# Patient Record
Sex: Female | Born: 1940 | Race: White | Hispanic: No | Marital: Married | Smoking: Never smoker
Health system: Southern US, Community
[De-identification: ages and names within clinical notes are randomized; demographics above are authoritative.]

## PROBLEM LIST (undated history)

## (undated) DIAGNOSIS — C50919 Malignant neoplasm of unspecified site of unspecified female breast: Secondary | ICD-10-CM

## (undated) DIAGNOSIS — E119 Type 2 diabetes mellitus without complications: Secondary | ICD-10-CM

## (undated) DIAGNOSIS — G4752 REM sleep behavior disorder: Secondary | ICD-10-CM

## (undated) DIAGNOSIS — G2581 Restless legs syndrome: Secondary | ICD-10-CM

## (undated) DIAGNOSIS — G20 Parkinson's disease: Secondary | ICD-10-CM

## (undated) DIAGNOSIS — E785 Hyperlipidemia, unspecified: Secondary | ICD-10-CM

## (undated) DIAGNOSIS — C7960 Secondary malignant neoplasm of unspecified ovary: Secondary | ICD-10-CM

## (undated) DIAGNOSIS — M1990 Unspecified osteoarthritis, unspecified site: Secondary | ICD-10-CM

## (undated) DIAGNOSIS — C189 Malignant neoplasm of colon, unspecified: Secondary | ICD-10-CM

## (undated) DIAGNOSIS — G20A1 Parkinson's disease without dyskinesia, without mention of fluctuations: Secondary | ICD-10-CM

## (undated) DIAGNOSIS — M419 Scoliosis, unspecified: Secondary | ICD-10-CM

## (undated) HISTORY — DX: Hyperlipidemia, unspecified: E78.5

## (undated) HISTORY — DX: Parkinson's disease without dyskinesia, without mention of fluctuations: G20.A1

## (undated) HISTORY — PX: BUNIONECTOMY: SHX129

## (undated) HISTORY — PX: BREAST LUMPECTOMY: SHX2

## (undated) HISTORY — PX: TONSILLECTOMY: SUR1361

## (undated) HISTORY — DX: Parkinson's disease: G20

## (undated) HISTORY — PX: OOPHORECTOMY: SHX86

## (undated) HISTORY — DX: Unspecified osteoarthritis, unspecified site: M19.90

## (undated) HISTORY — PX: COLON SURGERY: SHX602

## (undated) HISTORY — DX: Malignant neoplasm of unspecified site of unspecified female breast: C50.919

## (undated) HISTORY — DX: Scoliosis, unspecified: M41.9

## (undated) HISTORY — PX: NASAL SINUS SURGERY: SHX719

## (undated) HISTORY — DX: Type 2 diabetes mellitus without complications: E11.9

## (undated) HISTORY — DX: Secondary malignant neoplasm of unspecified ovary: C79.60

## (undated) HISTORY — PX: LAPAROSCOPY: SHX197

## (undated) HISTORY — PX: HEMORRHOID SURGERY: SHX153

## (undated) HISTORY — DX: REM sleep behavior disorder: G47.52

## (undated) HISTORY — DX: Malignant neoplasm of colon, unspecified: C18.9

## (undated) HISTORY — PX: TUBAL LIGATION: SHX77

## (undated) HISTORY — DX: Restless legs syndrome: G25.81

## (undated) HISTORY — PX: ADENOIDECTOMY: SUR15

---

## 2015-02-13 DIAGNOSIS — E063 Autoimmune thyroiditis: Secondary | ICD-10-CM | POA: Insufficient documentation

## 2015-02-13 DIAGNOSIS — C50919 Malignant neoplasm of unspecified site of unspecified female breast: Secondary | ICD-10-CM | POA: Insufficient documentation

## 2015-02-13 DIAGNOSIS — E559 Vitamin D deficiency, unspecified: Secondary | ICD-10-CM | POA: Insufficient documentation

## 2015-02-13 DIAGNOSIS — N390 Urinary tract infection, site not specified: Secondary | ICD-10-CM | POA: Insufficient documentation

## 2015-02-13 DIAGNOSIS — Z853 Personal history of malignant neoplasm of breast: Secondary | ICD-10-CM | POA: Insufficient documentation

## 2015-02-13 DIAGNOSIS — N952 Postmenopausal atrophic vaginitis: Secondary | ICD-10-CM | POA: Insufficient documentation

## 2015-02-13 DIAGNOSIS — K582 Mixed irritable bowel syndrome: Secondary | ICD-10-CM | POA: Insufficient documentation

## 2015-02-13 DIAGNOSIS — Z85038 Personal history of other malignant neoplasm of large intestine: Secondary | ICD-10-CM | POA: Insufficient documentation

## 2015-02-13 DIAGNOSIS — E038 Other specified hypothyroidism: Secondary | ICD-10-CM | POA: Insufficient documentation

## 2015-03-07 DIAGNOSIS — G620 Drug-induced polyneuropathy: Secondary | ICD-10-CM | POA: Insufficient documentation

## 2015-03-07 DIAGNOSIS — L720 Epidermal cyst: Secondary | ICD-10-CM | POA: Insufficient documentation

## 2015-03-07 DIAGNOSIS — H1013 Acute atopic conjunctivitis, bilateral: Secondary | ICD-10-CM | POA: Insufficient documentation

## 2015-03-07 DIAGNOSIS — R7301 Impaired fasting glucose: Secondary | ICD-10-CM | POA: Insufficient documentation

## 2015-03-07 DIAGNOSIS — E785 Hyperlipidemia, unspecified: Secondary | ICD-10-CM | POA: Insufficient documentation

## 2015-03-07 DIAGNOSIS — E782 Mixed hyperlipidemia: Secondary | ICD-10-CM | POA: Insufficient documentation

## 2016-03-20 DIAGNOSIS — Z7689 Persons encountering health services in other specified circumstances: Secondary | ICD-10-CM | POA: Diagnosis not present

## 2016-03-20 DIAGNOSIS — H65192 Other acute nonsuppurative otitis media, left ear: Secondary | ICD-10-CM | POA: Diagnosis not present

## 2016-03-30 LAB — HM COLONOSCOPY

## 2016-04-02 DIAGNOSIS — J3089 Other allergic rhinitis: Secondary | ICD-10-CM | POA: Diagnosis not present

## 2016-04-02 DIAGNOSIS — J3081 Allergic rhinitis due to animal (cat) (dog) hair and dander: Secondary | ICD-10-CM | POA: Diagnosis not present

## 2016-04-08 DIAGNOSIS — G620 Drug-induced polyneuropathy: Secondary | ICD-10-CM | POA: Diagnosis not present

## 2016-04-08 DIAGNOSIS — Z170 Estrogen receptor positive status [ER+]: Secondary | ICD-10-CM | POA: Diagnosis not present

## 2016-04-08 DIAGNOSIS — Z79811 Long term (current) use of aromatase inhibitors: Secondary | ICD-10-CM | POA: Diagnosis not present

## 2016-04-08 DIAGNOSIS — M2550 Pain in unspecified joint: Secondary | ICD-10-CM | POA: Diagnosis not present

## 2016-04-22 DIAGNOSIS — M7502 Adhesive capsulitis of left shoulder: Secondary | ICD-10-CM | POA: Diagnosis not present

## 2016-04-22 DIAGNOSIS — M25511 Pain in right shoulder: Secondary | ICD-10-CM | POA: Diagnosis not present

## 2016-04-22 DIAGNOSIS — M25512 Pain in left shoulder: Secondary | ICD-10-CM | POA: Diagnosis not present

## 2016-04-23 DIAGNOSIS — M7502 Adhesive capsulitis of left shoulder: Secondary | ICD-10-CM | POA: Diagnosis not present

## 2016-04-23 DIAGNOSIS — M6281 Muscle weakness (generalized): Secondary | ICD-10-CM | POA: Diagnosis not present

## 2016-04-23 DIAGNOSIS — M25512 Pain in left shoulder: Secondary | ICD-10-CM | POA: Diagnosis not present

## 2016-04-23 DIAGNOSIS — M25511 Pain in right shoulder: Secondary | ICD-10-CM | POA: Diagnosis not present

## 2016-04-27 DIAGNOSIS — J3089 Other allergic rhinitis: Secondary | ICD-10-CM | POA: Diagnosis not present

## 2016-04-27 DIAGNOSIS — J3081 Allergic rhinitis due to animal (cat) (dog) hair and dander: Secondary | ICD-10-CM | POA: Diagnosis not present

## 2016-04-28 DIAGNOSIS — M7502 Adhesive capsulitis of left shoulder: Secondary | ICD-10-CM | POA: Diagnosis not present

## 2016-04-28 DIAGNOSIS — M25511 Pain in right shoulder: Secondary | ICD-10-CM | POA: Diagnosis not present

## 2016-04-28 DIAGNOSIS — M6281 Muscle weakness (generalized): Secondary | ICD-10-CM | POA: Diagnosis not present

## 2016-04-28 DIAGNOSIS — M25512 Pain in left shoulder: Secondary | ICD-10-CM | POA: Diagnosis not present

## 2016-04-30 DIAGNOSIS — M7502 Adhesive capsulitis of left shoulder: Secondary | ICD-10-CM | POA: Diagnosis not present

## 2016-04-30 DIAGNOSIS — M25511 Pain in right shoulder: Secondary | ICD-10-CM | POA: Diagnosis not present

## 2016-04-30 DIAGNOSIS — M6281 Muscle weakness (generalized): Secondary | ICD-10-CM | POA: Diagnosis not present

## 2016-04-30 DIAGNOSIS — M25512 Pain in left shoulder: Secondary | ICD-10-CM | POA: Diagnosis not present

## 2016-05-12 DIAGNOSIS — M25511 Pain in right shoulder: Secondary | ICD-10-CM | POA: Diagnosis not present

## 2016-05-12 DIAGNOSIS — M25512 Pain in left shoulder: Secondary | ICD-10-CM | POA: Diagnosis not present

## 2016-05-12 DIAGNOSIS — M7502 Adhesive capsulitis of left shoulder: Secondary | ICD-10-CM | POA: Diagnosis not present

## 2016-05-12 DIAGNOSIS — M6281 Muscle weakness (generalized): Secondary | ICD-10-CM | POA: Diagnosis not present

## 2016-05-15 DIAGNOSIS — M6281 Muscle weakness (generalized): Secondary | ICD-10-CM | POA: Diagnosis not present

## 2016-05-15 DIAGNOSIS — M25511 Pain in right shoulder: Secondary | ICD-10-CM | POA: Diagnosis not present

## 2016-05-15 DIAGNOSIS — M7502 Adhesive capsulitis of left shoulder: Secondary | ICD-10-CM | POA: Diagnosis not present

## 2016-05-15 DIAGNOSIS — M25512 Pain in left shoulder: Secondary | ICD-10-CM | POA: Diagnosis not present

## 2016-05-21 DIAGNOSIS — J3089 Other allergic rhinitis: Secondary | ICD-10-CM | POA: Diagnosis not present

## 2016-05-21 DIAGNOSIS — J3081 Allergic rhinitis due to animal (cat) (dog) hair and dander: Secondary | ICD-10-CM | POA: Diagnosis not present

## 2016-05-25 DIAGNOSIS — J3081 Allergic rhinitis due to animal (cat) (dog) hair and dander: Secondary | ICD-10-CM | POA: Diagnosis not present

## 2016-05-25 DIAGNOSIS — J3089 Other allergic rhinitis: Secondary | ICD-10-CM | POA: Diagnosis not present

## 2016-05-29 DIAGNOSIS — M25512 Pain in left shoulder: Secondary | ICD-10-CM | POA: Diagnosis not present

## 2016-05-29 DIAGNOSIS — M7502 Adhesive capsulitis of left shoulder: Secondary | ICD-10-CM | POA: Diagnosis not present

## 2016-05-29 DIAGNOSIS — M6281 Muscle weakness (generalized): Secondary | ICD-10-CM | POA: Diagnosis not present

## 2016-05-29 DIAGNOSIS — M25511 Pain in right shoulder: Secondary | ICD-10-CM | POA: Diagnosis not present

## 2016-06-03 DIAGNOSIS — N390 Urinary tract infection, site not specified: Secondary | ICD-10-CM | POA: Diagnosis not present

## 2016-06-03 DIAGNOSIS — R300 Dysuria: Secondary | ICD-10-CM | POA: Diagnosis not present

## 2016-06-05 DIAGNOSIS — M25511 Pain in right shoulder: Secondary | ICD-10-CM | POA: Diagnosis not present

## 2016-06-05 DIAGNOSIS — M6281 Muscle weakness (generalized): Secondary | ICD-10-CM | POA: Diagnosis not present

## 2016-06-05 DIAGNOSIS — M7502 Adhesive capsulitis of left shoulder: Secondary | ICD-10-CM | POA: Diagnosis not present

## 2016-06-05 DIAGNOSIS — M25512 Pain in left shoulder: Secondary | ICD-10-CM | POA: Diagnosis not present

## 2016-06-09 DIAGNOSIS — M7502 Adhesive capsulitis of left shoulder: Secondary | ICD-10-CM | POA: Diagnosis not present

## 2016-06-09 DIAGNOSIS — E039 Hypothyroidism, unspecified: Secondary | ICD-10-CM | POA: Diagnosis not present

## 2016-06-09 DIAGNOSIS — M1612 Unilateral primary osteoarthritis, left hip: Secondary | ICD-10-CM | POA: Diagnosis not present

## 2016-06-09 DIAGNOSIS — M25511 Pain in right shoulder: Secondary | ICD-10-CM | POA: Diagnosis not present

## 2016-06-09 DIAGNOSIS — M25552 Pain in left hip: Secondary | ICD-10-CM | POA: Diagnosis not present

## 2016-06-09 DIAGNOSIS — M25512 Pain in left shoulder: Secondary | ICD-10-CM | POA: Diagnosis not present

## 2016-06-09 DIAGNOSIS — R739 Hyperglycemia, unspecified: Secondary | ICD-10-CM | POA: Diagnosis not present

## 2016-06-11 DIAGNOSIS — M6281 Muscle weakness (generalized): Secondary | ICD-10-CM | POA: Diagnosis not present

## 2016-06-11 DIAGNOSIS — M25512 Pain in left shoulder: Secondary | ICD-10-CM | POA: Diagnosis not present

## 2016-06-11 DIAGNOSIS — M25511 Pain in right shoulder: Secondary | ICD-10-CM | POA: Diagnosis not present

## 2016-06-11 DIAGNOSIS — M7502 Adhesive capsulitis of left shoulder: Secondary | ICD-10-CM | POA: Diagnosis not present

## 2016-06-16 DIAGNOSIS — G609 Hereditary and idiopathic neuropathy, unspecified: Secondary | ICD-10-CM | POA: Diagnosis not present

## 2016-06-16 DIAGNOSIS — K219 Gastro-esophageal reflux disease without esophagitis: Secondary | ICD-10-CM | POA: Diagnosis not present

## 2016-06-16 DIAGNOSIS — E039 Hypothyroidism, unspecified: Secondary | ICD-10-CM | POA: Diagnosis not present

## 2016-06-16 DIAGNOSIS — C50412 Malignant neoplasm of upper-outer quadrant of left female breast: Secondary | ICD-10-CM | POA: Diagnosis not present

## 2016-06-16 DIAGNOSIS — R739 Hyperglycemia, unspecified: Secondary | ICD-10-CM | POA: Diagnosis not present

## 2016-06-22 DIAGNOSIS — J3089 Other allergic rhinitis: Secondary | ICD-10-CM | POA: Diagnosis not present

## 2016-06-22 DIAGNOSIS — J3081 Allergic rhinitis due to animal (cat) (dog) hair and dander: Secondary | ICD-10-CM | POA: Diagnosis not present

## 2016-07-01 DIAGNOSIS — Z79811 Long term (current) use of aromatase inhibitors: Secondary | ICD-10-CM | POA: Diagnosis not present

## 2016-07-01 DIAGNOSIS — Z170 Estrogen receptor positive status [ER+]: Secondary | ICD-10-CM | POA: Diagnosis not present

## 2016-07-01 DIAGNOSIS — G620 Drug-induced polyneuropathy: Secondary | ICD-10-CM | POA: Diagnosis not present

## 2016-07-01 DIAGNOSIS — C180 Malignant neoplasm of cecum: Secondary | ICD-10-CM | POA: Diagnosis not present

## 2016-07-10 DIAGNOSIS — Q667 Congenital pes cavus: Secondary | ICD-10-CM | POA: Diagnosis not present

## 2016-07-10 DIAGNOSIS — M7742 Metatarsalgia, left foot: Secondary | ICD-10-CM | POA: Diagnosis not present

## 2016-07-10 DIAGNOSIS — M7741 Metatarsalgia, right foot: Secondary | ICD-10-CM | POA: Diagnosis not present

## 2016-07-10 DIAGNOSIS — L84 Corns and callosities: Secondary | ICD-10-CM | POA: Diagnosis not present

## 2016-07-20 DIAGNOSIS — R922 Inconclusive mammogram: Secondary | ICD-10-CM | POA: Diagnosis not present

## 2016-07-20 DIAGNOSIS — M810 Age-related osteoporosis without current pathological fracture: Secondary | ICD-10-CM | POA: Diagnosis not present

## 2016-07-20 DIAGNOSIS — M8589 Other specified disorders of bone density and structure, multiple sites: Secondary | ICD-10-CM | POA: Diagnosis not present

## 2016-07-20 DIAGNOSIS — Z13820 Encounter for screening for osteoporosis: Secondary | ICD-10-CM | POA: Diagnosis not present

## 2016-07-20 DIAGNOSIS — Z853 Personal history of malignant neoplasm of breast: Secondary | ICD-10-CM | POA: Diagnosis not present

## 2016-07-20 DIAGNOSIS — Z780 Asymptomatic menopausal state: Secondary | ICD-10-CM | POA: Diagnosis not present

## 2016-07-20 DIAGNOSIS — M799 Soft tissue disorder, unspecified: Secondary | ICD-10-CM | POA: Diagnosis not present

## 2016-07-21 DIAGNOSIS — J3089 Other allergic rhinitis: Secondary | ICD-10-CM | POA: Diagnosis not present

## 2016-07-21 DIAGNOSIS — J3081 Allergic rhinitis due to animal (cat) (dog) hair and dander: Secondary | ICD-10-CM | POA: Diagnosis not present

## 2016-08-20 DIAGNOSIS — J3081 Allergic rhinitis due to animal (cat) (dog) hair and dander: Secondary | ICD-10-CM | POA: Diagnosis not present

## 2016-08-20 DIAGNOSIS — J3089 Other allergic rhinitis: Secondary | ICD-10-CM | POA: Diagnosis not present

## 2016-08-27 DIAGNOSIS — M25561 Pain in right knee: Secondary | ICD-10-CM | POA: Diagnosis not present

## 2016-09-08 DIAGNOSIS — J3081 Allergic rhinitis due to animal (cat) (dog) hair and dander: Secondary | ICD-10-CM | POA: Diagnosis not present

## 2016-09-08 DIAGNOSIS — J3089 Other allergic rhinitis: Secondary | ICD-10-CM | POA: Diagnosis not present

## 2016-09-13 DIAGNOSIS — N398 Other specified disorders of urinary system: Secondary | ICD-10-CM | POA: Insufficient documentation

## 2016-09-13 DIAGNOSIS — J8410 Pulmonary fibrosis, unspecified: Secondary | ICD-10-CM | POA: Insufficient documentation

## 2016-09-13 DIAGNOSIS — N393 Stress incontinence (female) (male): Secondary | ICD-10-CM | POA: Insufficient documentation

## 2016-09-13 DIAGNOSIS — R319 Hematuria, unspecified: Secondary | ICD-10-CM | POA: Diagnosis not present

## 2016-09-13 DIAGNOSIS — N390 Urinary tract infection, site not specified: Secondary | ICD-10-CM | POA: Diagnosis not present

## 2016-09-13 DIAGNOSIS — R309 Painful micturition, unspecified: Secondary | ICD-10-CM | POA: Diagnosis not present

## 2016-09-13 DIAGNOSIS — K219 Gastro-esophageal reflux disease without esophagitis: Secondary | ICD-10-CM | POA: Insufficient documentation

## 2016-09-15 DIAGNOSIS — M47812 Spondylosis without myelopathy or radiculopathy, cervical region: Secondary | ICD-10-CM | POA: Diagnosis not present

## 2016-09-15 DIAGNOSIS — M419 Scoliosis, unspecified: Secondary | ICD-10-CM | POA: Diagnosis not present

## 2016-09-15 DIAGNOSIS — M542 Cervicalgia: Secondary | ICD-10-CM | POA: Diagnosis not present

## 2016-09-23 DIAGNOSIS — R251 Tremor, unspecified: Secondary | ICD-10-CM | POA: Diagnosis not present

## 2016-09-23 LAB — CBC AND DIFFERENTIAL
HCT: 42 (ref 36–46)
Hemoglobin: 14.4 (ref 12.0–16.0)
Platelets: 246 (ref 150–399)
WBC: 5.2

## 2016-09-23 LAB — BASIC METABOLIC PANEL
BUN: 14 (ref 4–21)
Creatinine: 1.1 (ref <=1.1)
Glucose: 103
Potassium: 4.1 (ref 3.4–5.3)
Sodium: 134 — AB (ref 137–147)

## 2016-09-23 LAB — VITAMIN D 25 HYDROXY (VIT D DEFICIENCY, FRACTURES): Vit D, 25-Hydroxy: 57.32

## 2016-09-23 LAB — HEPATIC FUNCTION PANEL
ALT: 13 (ref 7–35)
AST: 19 (ref 13–35)

## 2016-09-25 DIAGNOSIS — M47812 Spondylosis without myelopathy or radiculopathy, cervical region: Secondary | ICD-10-CM | POA: Diagnosis not present

## 2016-09-25 DIAGNOSIS — M419 Scoliosis, unspecified: Secondary | ICD-10-CM | POA: Diagnosis not present

## 2016-09-25 DIAGNOSIS — M542 Cervicalgia: Secondary | ICD-10-CM | POA: Diagnosis not present

## 2016-10-02 DIAGNOSIS — J3089 Other allergic rhinitis: Secondary | ICD-10-CM | POA: Diagnosis not present

## 2016-10-02 DIAGNOSIS — J3081 Allergic rhinitis due to animal (cat) (dog) hair and dander: Secondary | ICD-10-CM | POA: Diagnosis not present

## 2016-10-13 DIAGNOSIS — M419 Scoliosis, unspecified: Secondary | ICD-10-CM | POA: Diagnosis not present

## 2016-10-13 DIAGNOSIS — M47812 Spondylosis without myelopathy or radiculopathy, cervical region: Secondary | ICD-10-CM | POA: Diagnosis not present

## 2016-10-13 DIAGNOSIS — M542 Cervicalgia: Secondary | ICD-10-CM | POA: Diagnosis not present

## 2016-10-16 DIAGNOSIS — M419 Scoliosis, unspecified: Secondary | ICD-10-CM | POA: Diagnosis not present

## 2016-10-16 DIAGNOSIS — M47812 Spondylosis without myelopathy or radiculopathy, cervical region: Secondary | ICD-10-CM | POA: Diagnosis not present

## 2016-10-16 DIAGNOSIS — M542 Cervicalgia: Secondary | ICD-10-CM | POA: Diagnosis not present

## 2016-10-19 DIAGNOSIS — M542 Cervicalgia: Secondary | ICD-10-CM | POA: Diagnosis not present

## 2016-10-19 DIAGNOSIS — M419 Scoliosis, unspecified: Secondary | ICD-10-CM | POA: Diagnosis not present

## 2016-10-19 DIAGNOSIS — M47812 Spondylosis without myelopathy or radiculopathy, cervical region: Secondary | ICD-10-CM | POA: Diagnosis not present

## 2016-10-21 DIAGNOSIS — R300 Dysuria: Secondary | ICD-10-CM | POA: Diagnosis not present

## 2016-10-21 DIAGNOSIS — G252 Other specified forms of tremor: Secondary | ICD-10-CM | POA: Diagnosis not present

## 2016-10-23 DIAGNOSIS — J3089 Other allergic rhinitis: Secondary | ICD-10-CM | POA: Diagnosis not present

## 2016-10-23 DIAGNOSIS — J3081 Allergic rhinitis due to animal (cat) (dog) hair and dander: Secondary | ICD-10-CM | POA: Diagnosis not present

## 2016-11-03 DIAGNOSIS — J3089 Other allergic rhinitis: Secondary | ICD-10-CM | POA: Diagnosis not present

## 2016-11-03 DIAGNOSIS — M50223 Other cervical disc displacement at C6-C7 level: Secondary | ICD-10-CM | POA: Diagnosis not present

## 2016-11-03 DIAGNOSIS — J3081 Allergic rhinitis due to animal (cat) (dog) hair and dander: Secondary | ICD-10-CM | POA: Diagnosis not present

## 2016-11-03 DIAGNOSIS — M4802 Spinal stenosis, cervical region: Secondary | ICD-10-CM | POA: Diagnosis not present

## 2016-11-03 DIAGNOSIS — M50322 Other cervical disc degeneration at C5-C6 level: Secondary | ICD-10-CM | POA: Diagnosis not present

## 2016-11-03 DIAGNOSIS — M50321 Other cervical disc degeneration at C4-C5 level: Secondary | ICD-10-CM | POA: Diagnosis not present

## 2016-11-03 DIAGNOSIS — M542 Cervicalgia: Secondary | ICD-10-CM | POA: Diagnosis not present

## 2016-11-06 DIAGNOSIS — R251 Tremor, unspecified: Secondary | ICD-10-CM | POA: Diagnosis not present

## 2016-11-06 DIAGNOSIS — M48062 Spinal stenosis, lumbar region with neurogenic claudication: Secondary | ICD-10-CM | POA: Diagnosis not present

## 2016-11-06 DIAGNOSIS — M47812 Spondylosis without myelopathy or radiculopathy, cervical region: Secondary | ICD-10-CM | POA: Diagnosis not present

## 2016-11-06 DIAGNOSIS — M542 Cervicalgia: Secondary | ICD-10-CM | POA: Diagnosis not present

## 2016-11-09 DIAGNOSIS — R251 Tremor, unspecified: Secondary | ICD-10-CM | POA: Diagnosis not present

## 2016-11-09 DIAGNOSIS — M542 Cervicalgia: Secondary | ICD-10-CM | POA: Diagnosis not present

## 2016-11-09 DIAGNOSIS — M47812 Spondylosis without myelopathy or radiculopathy, cervical region: Secondary | ICD-10-CM | POA: Diagnosis not present

## 2016-11-09 DIAGNOSIS — M48062 Spinal stenosis, lumbar region with neurogenic claudication: Secondary | ICD-10-CM | POA: Diagnosis not present

## 2016-11-11 DIAGNOSIS — J328 Other chronic sinusitis: Secondary | ICD-10-CM | POA: Diagnosis not present

## 2016-11-11 DIAGNOSIS — J3081 Allergic rhinitis due to animal (cat) (dog) hair and dander: Secondary | ICD-10-CM | POA: Diagnosis not present

## 2016-11-11 DIAGNOSIS — K219 Gastro-esophageal reflux disease without esophagitis: Secondary | ICD-10-CM | POA: Diagnosis not present

## 2016-11-13 DIAGNOSIS — R251 Tremor, unspecified: Secondary | ICD-10-CM | POA: Diagnosis not present

## 2016-11-13 DIAGNOSIS — M47812 Spondylosis without myelopathy or radiculopathy, cervical region: Secondary | ICD-10-CM | POA: Diagnosis not present

## 2016-11-13 DIAGNOSIS — M48062 Spinal stenosis, lumbar region with neurogenic claudication: Secondary | ICD-10-CM | POA: Diagnosis not present

## 2016-11-13 DIAGNOSIS — M542 Cervicalgia: Secondary | ICD-10-CM | POA: Diagnosis not present

## 2016-11-16 DIAGNOSIS — J3089 Other allergic rhinitis: Secondary | ICD-10-CM | POA: Diagnosis not present

## 2016-11-16 DIAGNOSIS — J3081 Allergic rhinitis due to animal (cat) (dog) hair and dander: Secondary | ICD-10-CM | POA: Diagnosis not present

## 2016-11-24 DIAGNOSIS — J3089 Other allergic rhinitis: Secondary | ICD-10-CM | POA: Diagnosis not present

## 2016-11-24 DIAGNOSIS — J3081 Allergic rhinitis due to animal (cat) (dog) hair and dander: Secondary | ICD-10-CM | POA: Diagnosis not present

## 2016-11-25 DIAGNOSIS — K5900 Constipation, unspecified: Secondary | ICD-10-CM | POA: Diagnosis not present

## 2016-11-25 DIAGNOSIS — R194 Change in bowel habit: Secondary | ICD-10-CM | POA: Diagnosis not present

## 2016-11-26 DIAGNOSIS — G609 Hereditary and idiopathic neuropathy, unspecified: Secondary | ICD-10-CM | POA: Diagnosis not present

## 2016-11-26 DIAGNOSIS — R251 Tremor, unspecified: Secondary | ICD-10-CM | POA: Diagnosis not present

## 2016-11-27 DIAGNOSIS — Z79899 Other long term (current) drug therapy: Secondary | ICD-10-CM | POA: Diagnosis not present

## 2016-11-27 DIAGNOSIS — Z853 Personal history of malignant neoplasm of breast: Secondary | ICD-10-CM | POA: Diagnosis not present

## 2016-11-27 DIAGNOSIS — S79911A Unspecified injury of right hip, initial encounter: Secondary | ICD-10-CM | POA: Diagnosis not present

## 2016-11-27 DIAGNOSIS — Z8543 Personal history of malignant neoplasm of ovary: Secondary | ICD-10-CM | POA: Diagnosis not present

## 2016-11-27 DIAGNOSIS — M25551 Pain in right hip: Secondary | ICD-10-CM | POA: Diagnosis not present

## 2016-11-27 DIAGNOSIS — S32009A Unspecified fracture of unspecified lumbar vertebra, initial encounter for closed fracture: Secondary | ICD-10-CM | POA: Diagnosis not present

## 2016-11-27 DIAGNOSIS — W010XXA Fall on same level from slipping, tripping and stumbling without subsequent striking against object, initial encounter: Secondary | ICD-10-CM | POA: Diagnosis not present

## 2016-11-27 DIAGNOSIS — M545 Low back pain: Secondary | ICD-10-CM | POA: Diagnosis not present

## 2016-11-27 DIAGNOSIS — M1990 Unspecified osteoarthritis, unspecified site: Secondary | ICD-10-CM | POA: Diagnosis not present

## 2016-11-27 DIAGNOSIS — S3992XA Unspecified injury of lower back, initial encounter: Secondary | ICD-10-CM | POA: Diagnosis not present

## 2016-11-27 DIAGNOSIS — S32048A Other fracture of fourth lumbar vertebra, initial encounter for closed fracture: Secondary | ICD-10-CM | POA: Diagnosis not present

## 2016-11-27 DIAGNOSIS — Z7982 Long term (current) use of aspirin: Secondary | ICD-10-CM | POA: Diagnosis not present

## 2016-12-04 DIAGNOSIS — J3089 Other allergic rhinitis: Secondary | ICD-10-CM | POA: Diagnosis not present

## 2016-12-04 DIAGNOSIS — J3081 Allergic rhinitis due to animal (cat) (dog) hair and dander: Secondary | ICD-10-CM | POA: Diagnosis not present

## 2016-12-05 DIAGNOSIS — N3000 Acute cystitis without hematuria: Secondary | ICD-10-CM | POA: Diagnosis not present

## 2016-12-05 DIAGNOSIS — R3915 Urgency of urination: Secondary | ICD-10-CM | POA: Diagnosis not present

## 2016-12-14 DIAGNOSIS — E039 Hypothyroidism, unspecified: Secondary | ICD-10-CM | POA: Diagnosis not present

## 2016-12-14 DIAGNOSIS — R739 Hyperglycemia, unspecified: Secondary | ICD-10-CM | POA: Diagnosis not present

## 2016-12-16 DIAGNOSIS — J3089 Other allergic rhinitis: Secondary | ICD-10-CM | POA: Diagnosis not present

## 2016-12-16 DIAGNOSIS — J3081 Allergic rhinitis due to animal (cat) (dog) hair and dander: Secondary | ICD-10-CM | POA: Diagnosis not present

## 2016-12-17 DIAGNOSIS — E039 Hypothyroidism, unspecified: Secondary | ICD-10-CM | POA: Diagnosis not present

## 2016-12-17 DIAGNOSIS — S32008A Other fracture of unspecified lumbar vertebra, initial encounter for closed fracture: Secondary | ICD-10-CM | POA: Diagnosis not present

## 2016-12-17 DIAGNOSIS — R739 Hyperglycemia, unspecified: Secondary | ICD-10-CM | POA: Diagnosis not present

## 2016-12-17 DIAGNOSIS — K219 Gastro-esophageal reflux disease without esophagitis: Secondary | ICD-10-CM | POA: Diagnosis not present

## 2016-12-17 DIAGNOSIS — G609 Hereditary and idiopathic neuropathy, unspecified: Secondary | ICD-10-CM | POA: Diagnosis not present

## 2016-12-23 DIAGNOSIS — N390 Urinary tract infection, site not specified: Secondary | ICD-10-CM | POA: Diagnosis not present

## 2016-12-25 DIAGNOSIS — J3089 Other allergic rhinitis: Secondary | ICD-10-CM | POA: Diagnosis not present

## 2016-12-25 DIAGNOSIS — J3081 Allergic rhinitis due to animal (cat) (dog) hair and dander: Secondary | ICD-10-CM | POA: Diagnosis not present

## 2016-12-29 DIAGNOSIS — N390 Urinary tract infection, site not specified: Secondary | ICD-10-CM | POA: Diagnosis not present

## 2016-12-29 DIAGNOSIS — M6281 Muscle weakness (generalized): Secondary | ICD-10-CM | POA: Diagnosis not present

## 2016-12-29 DIAGNOSIS — N952 Postmenopausal atrophic vaginitis: Secondary | ICD-10-CM | POA: Diagnosis not present

## 2016-12-29 DIAGNOSIS — K5900 Constipation, unspecified: Secondary | ICD-10-CM | POA: Diagnosis not present

## 2016-12-29 DIAGNOSIS — R195 Other fecal abnormalities: Secondary | ICD-10-CM | POA: Diagnosis not present

## 2016-12-30 DIAGNOSIS — R69 Illness, unspecified: Secondary | ICD-10-CM | POA: Diagnosis not present

## 2017-01-05 DIAGNOSIS — J3089 Other allergic rhinitis: Secondary | ICD-10-CM | POA: Diagnosis not present

## 2017-01-05 DIAGNOSIS — Z1239 Encounter for other screening for malignant neoplasm of breast: Secondary | ICD-10-CM | POA: Diagnosis not present

## 2017-01-05 DIAGNOSIS — K219 Gastro-esophageal reflux disease without esophagitis: Secondary | ICD-10-CM | POA: Diagnosis not present

## 2017-01-05 DIAGNOSIS — Z483 Aftercare following surgery for neoplasm: Secondary | ICD-10-CM | POA: Diagnosis not present

## 2017-01-05 DIAGNOSIS — Z853 Personal history of malignant neoplasm of breast: Secondary | ICD-10-CM | POA: Diagnosis not present

## 2017-01-05 DIAGNOSIS — J3081 Allergic rhinitis due to animal (cat) (dog) hair and dander: Secondary | ICD-10-CM | POA: Diagnosis not present

## 2017-01-06 DIAGNOSIS — C50412 Malignant neoplasm of upper-outer quadrant of left female breast: Secondary | ICD-10-CM | POA: Diagnosis not present

## 2017-01-06 DIAGNOSIS — Z170 Estrogen receptor positive status [ER+]: Secondary | ICD-10-CM | POA: Diagnosis not present

## 2017-01-06 DIAGNOSIS — Z79810 Long term (current) use of selective estrogen receptor modulators (SERMs): Secondary | ICD-10-CM | POA: Diagnosis not present

## 2017-01-06 DIAGNOSIS — C180 Malignant neoplasm of cecum: Secondary | ICD-10-CM | POA: Diagnosis not present

## 2017-01-14 DIAGNOSIS — J3089 Other allergic rhinitis: Secondary | ICD-10-CM | POA: Diagnosis not present

## 2017-01-14 DIAGNOSIS — J3081 Allergic rhinitis due to animal (cat) (dog) hair and dander: Secondary | ICD-10-CM | POA: Diagnosis not present

## 2017-01-19 DIAGNOSIS — Z85038 Personal history of other malignant neoplasm of large intestine: Secondary | ICD-10-CM | POA: Diagnosis not present

## 2017-01-19 DIAGNOSIS — Z1211 Encounter for screening for malignant neoplasm of colon: Secondary | ICD-10-CM | POA: Diagnosis not present

## 2017-01-19 DIAGNOSIS — Z800 Family history of malignant neoplasm of digestive organs: Secondary | ICD-10-CM | POA: Diagnosis not present

## 2017-01-19 DIAGNOSIS — Z8371 Family history of colonic polyps: Secondary | ICD-10-CM | POA: Diagnosis not present

## 2017-01-19 DIAGNOSIS — R69 Illness, unspecified: Secondary | ICD-10-CM | POA: Diagnosis not present

## 2017-01-20 DIAGNOSIS — R300 Dysuria: Secondary | ICD-10-CM | POA: Diagnosis not present

## 2017-01-22 DIAGNOSIS — J3089 Other allergic rhinitis: Secondary | ICD-10-CM | POA: Diagnosis not present

## 2017-01-22 DIAGNOSIS — J3081 Allergic rhinitis due to animal (cat) (dog) hair and dander: Secondary | ICD-10-CM | POA: Diagnosis not present

## 2017-02-01 DIAGNOSIS — J3081 Allergic rhinitis due to animal (cat) (dog) hair and dander: Secondary | ICD-10-CM | POA: Diagnosis not present

## 2017-02-01 DIAGNOSIS — J3089 Other allergic rhinitis: Secondary | ICD-10-CM | POA: Diagnosis not present

## 2017-02-05 DIAGNOSIS — Z803 Family history of malignant neoplasm of breast: Secondary | ICD-10-CM | POA: Diagnosis not present

## 2017-02-05 DIAGNOSIS — Z853 Personal history of malignant neoplasm of breast: Secondary | ICD-10-CM | POA: Diagnosis not present

## 2017-02-05 DIAGNOSIS — Z780 Asymptomatic menopausal state: Secondary | ICD-10-CM | POA: Diagnosis not present

## 2017-02-05 DIAGNOSIS — R922 Inconclusive mammogram: Secondary | ICD-10-CM | POA: Diagnosis not present

## 2017-02-08 DIAGNOSIS — N898 Other specified noninflammatory disorders of vagina: Secondary | ICD-10-CM | POA: Diagnosis not present

## 2017-02-08 DIAGNOSIS — R300 Dysuria: Secondary | ICD-10-CM | POA: Diagnosis not present

## 2017-02-13 DIAGNOSIS — M810 Age-related osteoporosis without current pathological fracture: Secondary | ICD-10-CM | POA: Diagnosis not present

## 2017-02-13 DIAGNOSIS — Z0000 Encounter for general adult medical examination without abnormal findings: Secondary | ICD-10-CM | POA: Diagnosis not present

## 2017-02-25 DIAGNOSIS — J3081 Allergic rhinitis due to animal (cat) (dog) hair and dander: Secondary | ICD-10-CM | POA: Diagnosis not present

## 2017-02-25 DIAGNOSIS — J3089 Other allergic rhinitis: Secondary | ICD-10-CM | POA: Diagnosis not present

## 2017-02-26 DIAGNOSIS — M810 Age-related osteoporosis without current pathological fracture: Secondary | ICD-10-CM | POA: Diagnosis not present

## 2017-03-01 DIAGNOSIS — R251 Tremor, unspecified: Secondary | ICD-10-CM | POA: Diagnosis not present

## 2017-03-01 DIAGNOSIS — G609 Hereditary and idiopathic neuropathy, unspecified: Secondary | ICD-10-CM | POA: Diagnosis not present

## 2017-03-10 DIAGNOSIS — M25512 Pain in left shoulder: Secondary | ICD-10-CM | POA: Diagnosis not present

## 2017-03-10 DIAGNOSIS — R300 Dysuria: Secondary | ICD-10-CM | POA: Diagnosis not present

## 2017-03-10 DIAGNOSIS — M549 Dorsalgia, unspecified: Secondary | ICD-10-CM | POA: Diagnosis not present

## 2017-03-11 DIAGNOSIS — J929 Pleural plaque without asbestos: Secondary | ICD-10-CM | POA: Diagnosis not present

## 2017-03-11 DIAGNOSIS — M25512 Pain in left shoulder: Secondary | ICD-10-CM | POA: Diagnosis not present

## 2017-03-11 DIAGNOSIS — M19012 Primary osteoarthritis, left shoulder: Secondary | ICD-10-CM | POA: Diagnosis not present

## 2017-03-11 DIAGNOSIS — M810 Age-related osteoporosis without current pathological fracture: Secondary | ICD-10-CM | POA: Diagnosis not present

## 2017-03-16 DIAGNOSIS — J3081 Allergic rhinitis due to animal (cat) (dog) hair and dander: Secondary | ICD-10-CM | POA: Diagnosis not present

## 2017-03-16 DIAGNOSIS — J3089 Other allergic rhinitis: Secondary | ICD-10-CM | POA: Diagnosis not present

## 2017-03-31 DIAGNOSIS — J3089 Other allergic rhinitis: Secondary | ICD-10-CM | POA: Diagnosis not present

## 2017-03-31 DIAGNOSIS — J3081 Allergic rhinitis due to animal (cat) (dog) hair and dander: Secondary | ICD-10-CM | POA: Diagnosis not present

## 2017-04-23 DIAGNOSIS — J3081 Allergic rhinitis due to animal (cat) (dog) hair and dander: Secondary | ICD-10-CM | POA: Diagnosis not present

## 2017-04-23 DIAGNOSIS — J3089 Other allergic rhinitis: Secondary | ICD-10-CM | POA: Diagnosis not present

## 2017-05-05 DIAGNOSIS — J3081 Allergic rhinitis due to animal (cat) (dog) hair and dander: Secondary | ICD-10-CM | POA: Diagnosis not present

## 2017-05-05 DIAGNOSIS — J3089 Other allergic rhinitis: Secondary | ICD-10-CM | POA: Diagnosis not present

## 2017-05-12 DIAGNOSIS — J3081 Allergic rhinitis due to animal (cat) (dog) hair and dander: Secondary | ICD-10-CM | POA: Diagnosis not present

## 2017-05-12 DIAGNOSIS — J3089 Other allergic rhinitis: Secondary | ICD-10-CM | POA: Diagnosis not present

## 2017-05-26 DIAGNOSIS — J3089 Other allergic rhinitis: Secondary | ICD-10-CM | POA: Diagnosis not present

## 2017-05-26 DIAGNOSIS — J3081 Allergic rhinitis due to animal (cat) (dog) hair and dander: Secondary | ICD-10-CM | POA: Diagnosis not present

## 2017-06-10 DIAGNOSIS — J3089 Other allergic rhinitis: Secondary | ICD-10-CM | POA: Diagnosis not present

## 2017-06-10 DIAGNOSIS — J3081 Allergic rhinitis due to animal (cat) (dog) hair and dander: Secondary | ICD-10-CM | POA: Diagnosis not present

## 2017-06-11 DIAGNOSIS — E039 Hypothyroidism, unspecified: Secondary | ICD-10-CM | POA: Diagnosis not present

## 2017-06-11 DIAGNOSIS — R739 Hyperglycemia, unspecified: Secondary | ICD-10-CM | POA: Diagnosis not present

## 2017-06-16 DIAGNOSIS — C50412 Malignant neoplasm of upper-outer quadrant of left female breast: Secondary | ICD-10-CM | POA: Diagnosis not present

## 2017-06-16 DIAGNOSIS — K219 Gastro-esophageal reflux disease without esophagitis: Secondary | ICD-10-CM | POA: Diagnosis not present

## 2017-06-16 DIAGNOSIS — R739 Hyperglycemia, unspecified: Secondary | ICD-10-CM | POA: Diagnosis not present

## 2017-06-16 DIAGNOSIS — G609 Hereditary and idiopathic neuropathy, unspecified: Secondary | ICD-10-CM | POA: Diagnosis not present

## 2017-06-16 DIAGNOSIS — E039 Hypothyroidism, unspecified: Secondary | ICD-10-CM | POA: Diagnosis not present

## 2017-06-28 DIAGNOSIS — C50412 Malignant neoplasm of upper-outer quadrant of left female breast: Secondary | ICD-10-CM | POA: Diagnosis not present

## 2017-06-28 DIAGNOSIS — Z85030 Personal history of malignant carcinoid tumor of large intestine: Secondary | ICD-10-CM | POA: Diagnosis not present

## 2017-06-28 DIAGNOSIS — Z79810 Long term (current) use of selective estrogen receptor modulators (SERMs): Secondary | ICD-10-CM | POA: Diagnosis not present

## 2017-06-28 DIAGNOSIS — Z170 Estrogen receptor positive status [ER+]: Secondary | ICD-10-CM | POA: Diagnosis not present

## 2017-06-28 DIAGNOSIS — G620 Drug-induced polyneuropathy: Secondary | ICD-10-CM | POA: Diagnosis not present

## 2017-07-01 DIAGNOSIS — J3081 Allergic rhinitis due to animal (cat) (dog) hair and dander: Secondary | ICD-10-CM | POA: Diagnosis not present

## 2017-07-01 DIAGNOSIS — J3089 Other allergic rhinitis: Secondary | ICD-10-CM | POA: Diagnosis not present

## 2017-07-09 DIAGNOSIS — G43819 Other migraine, intractable, without status migrainosus: Secondary | ICD-10-CM | POA: Diagnosis not present

## 2017-08-05 DIAGNOSIS — J3081 Allergic rhinitis due to animal (cat) (dog) hair and dander: Secondary | ICD-10-CM | POA: Diagnosis not present

## 2017-08-05 DIAGNOSIS — J3089 Other allergic rhinitis: Secondary | ICD-10-CM | POA: Diagnosis not present

## 2017-08-12 DIAGNOSIS — J3089 Other allergic rhinitis: Secondary | ICD-10-CM | POA: Diagnosis not present

## 2017-08-12 DIAGNOSIS — J3081 Allergic rhinitis due to animal (cat) (dog) hair and dander: Secondary | ICD-10-CM | POA: Diagnosis not present

## 2017-08-17 DIAGNOSIS — J3081 Allergic rhinitis due to animal (cat) (dog) hair and dander: Secondary | ICD-10-CM | POA: Diagnosis not present

## 2017-08-17 DIAGNOSIS — J3089 Other allergic rhinitis: Secondary | ICD-10-CM | POA: Diagnosis not present

## 2017-08-24 DIAGNOSIS — J3089 Other allergic rhinitis: Secondary | ICD-10-CM | POA: Diagnosis not present

## 2017-08-24 DIAGNOSIS — J3081 Allergic rhinitis due to animal (cat) (dog) hair and dander: Secondary | ICD-10-CM | POA: Diagnosis not present

## 2017-09-09 DIAGNOSIS — J3081 Allergic rhinitis due to animal (cat) (dog) hair and dander: Secondary | ICD-10-CM | POA: Diagnosis not present

## 2017-09-09 DIAGNOSIS — J3089 Other allergic rhinitis: Secondary | ICD-10-CM | POA: Diagnosis not present

## 2017-09-15 DIAGNOSIS — J3089 Other allergic rhinitis: Secondary | ICD-10-CM | POA: Diagnosis not present

## 2017-09-15 DIAGNOSIS — J3081 Allergic rhinitis due to animal (cat) (dog) hair and dander: Secondary | ICD-10-CM | POA: Diagnosis not present

## 2017-09-21 DIAGNOSIS — N3000 Acute cystitis without hematuria: Secondary | ICD-10-CM | POA: Diagnosis not present

## 2017-09-22 DIAGNOSIS — K5900 Constipation, unspecified: Secondary | ICD-10-CM | POA: Diagnosis not present

## 2017-09-22 DIAGNOSIS — J309 Allergic rhinitis, unspecified: Secondary | ICD-10-CM | POA: Diagnosis not present

## 2017-09-22 DIAGNOSIS — C7880 Secondary malignant neoplasm of unspecified digestive organ: Secondary | ICD-10-CM | POA: Diagnosis not present

## 2017-09-22 DIAGNOSIS — C7960 Secondary malignant neoplasm of unspecified ovary: Secondary | ICD-10-CM | POA: Diagnosis not present

## 2017-09-22 DIAGNOSIS — E039 Hypothyroidism, unspecified: Secondary | ICD-10-CM | POA: Diagnosis not present

## 2017-09-22 DIAGNOSIS — N390 Urinary tract infection, site not specified: Secondary | ICD-10-CM | POA: Diagnosis not present

## 2017-09-22 DIAGNOSIS — H04129 Dry eye syndrome of unspecified lacrimal gland: Secondary | ICD-10-CM | POA: Diagnosis not present

## 2017-09-22 DIAGNOSIS — M810 Age-related osteoporosis without current pathological fracture: Secondary | ICD-10-CM | POA: Diagnosis not present

## 2017-09-22 DIAGNOSIS — C50919 Malignant neoplasm of unspecified site of unspecified female breast: Secondary | ICD-10-CM | POA: Diagnosis not present

## 2017-09-22 DIAGNOSIS — R69 Illness, unspecified: Secondary | ICD-10-CM | POA: Diagnosis not present

## 2017-09-29 DIAGNOSIS — G20A1 Parkinson's disease without dyskinesia, without mention of fluctuations: Secondary | ICD-10-CM | POA: Insufficient documentation

## 2017-09-29 DIAGNOSIS — G20 Parkinson's disease: Secondary | ICD-10-CM | POA: Diagnosis not present

## 2017-09-29 DIAGNOSIS — R69 Illness, unspecified: Secondary | ICD-10-CM | POA: Diagnosis not present

## 2017-09-29 DIAGNOSIS — F419 Anxiety disorder, unspecified: Secondary | ICD-10-CM | POA: Insufficient documentation

## 2017-10-05 DIAGNOSIS — J3089 Other allergic rhinitis: Secondary | ICD-10-CM | POA: Diagnosis not present

## 2017-10-05 DIAGNOSIS — J3081 Allergic rhinitis due to animal (cat) (dog) hair and dander: Secondary | ICD-10-CM | POA: Diagnosis not present

## 2017-10-20 DIAGNOSIS — N390 Urinary tract infection, site not specified: Secondary | ICD-10-CM | POA: Diagnosis not present

## 2017-10-26 DIAGNOSIS — Z853 Personal history of malignant neoplasm of breast: Secondary | ICD-10-CM | POA: Diagnosis not present

## 2017-10-26 DIAGNOSIS — Z1239 Encounter for other screening for malignant neoplasm of breast: Secondary | ICD-10-CM | POA: Diagnosis not present

## 2017-10-26 DIAGNOSIS — J3089 Other allergic rhinitis: Secondary | ICD-10-CM | POA: Diagnosis not present

## 2017-10-26 DIAGNOSIS — K219 Gastro-esophageal reflux disease without esophagitis: Secondary | ICD-10-CM | POA: Diagnosis not present

## 2017-10-26 DIAGNOSIS — Z483 Aftercare following surgery for neoplasm: Secondary | ICD-10-CM | POA: Diagnosis not present

## 2017-10-26 DIAGNOSIS — J3081 Allergic rhinitis due to animal (cat) (dog) hair and dander: Secondary | ICD-10-CM | POA: Diagnosis not present

## 2017-11-12 DIAGNOSIS — J3081 Allergic rhinitis due to animal (cat) (dog) hair and dander: Secondary | ICD-10-CM | POA: Diagnosis not present

## 2017-11-12 DIAGNOSIS — G20 Parkinson's disease: Secondary | ICD-10-CM | POA: Diagnosis not present

## 2017-11-12 DIAGNOSIS — J3089 Other allergic rhinitis: Secondary | ICD-10-CM | POA: Diagnosis not present

## 2017-11-12 DIAGNOSIS — R69 Illness, unspecified: Secondary | ICD-10-CM | POA: Diagnosis not present

## 2017-11-22 DIAGNOSIS — R300 Dysuria: Secondary | ICD-10-CM | POA: Diagnosis not present

## 2017-11-22 DIAGNOSIS — N8110 Cystocele, unspecified: Secondary | ICD-10-CM | POA: Diagnosis not present

## 2017-11-22 DIAGNOSIS — N952 Postmenopausal atrophic vaginitis: Secondary | ICD-10-CM | POA: Diagnosis not present

## 2017-11-30 DIAGNOSIS — G609 Hereditary and idiopathic neuropathy, unspecified: Secondary | ICD-10-CM | POA: Diagnosis not present

## 2017-11-30 DIAGNOSIS — N390 Urinary tract infection, site not specified: Secondary | ICD-10-CM | POA: Diagnosis not present

## 2017-11-30 DIAGNOSIS — R739 Hyperglycemia, unspecified: Secondary | ICD-10-CM | POA: Diagnosis not present

## 2017-11-30 DIAGNOSIS — E039 Hypothyroidism, unspecified: Secondary | ICD-10-CM | POA: Diagnosis not present

## 2017-11-30 DIAGNOSIS — G20 Parkinson's disease: Secondary | ICD-10-CM | POA: Diagnosis not present

## 2017-11-30 DIAGNOSIS — K219 Gastro-esophageal reflux disease without esophagitis: Secondary | ICD-10-CM | POA: Diagnosis not present

## 2017-12-01 DIAGNOSIS — E039 Hypothyroidism, unspecified: Secondary | ICD-10-CM | POA: Diagnosis not present

## 2017-12-01 DIAGNOSIS — J3081 Allergic rhinitis due to animal (cat) (dog) hair and dander: Secondary | ICD-10-CM | POA: Diagnosis not present

## 2017-12-01 DIAGNOSIS — J3089 Other allergic rhinitis: Secondary | ICD-10-CM | POA: Diagnosis not present

## 2017-12-01 DIAGNOSIS — R739 Hyperglycemia, unspecified: Secondary | ICD-10-CM | POA: Diagnosis not present

## 2017-12-01 LAB — HEMOGLOBIN A1C: Hemoglobin A1C: 6.3

## 2017-12-02 LAB — TSH: TSH: 1.19 (ref <=5.90)

## 2017-12-03 DIAGNOSIS — J3089 Other allergic rhinitis: Secondary | ICD-10-CM | POA: Diagnosis not present

## 2017-12-03 DIAGNOSIS — J3081 Allergic rhinitis due to animal (cat) (dog) hair and dander: Secondary | ICD-10-CM | POA: Diagnosis not present

## 2017-12-06 DIAGNOSIS — C7960 Secondary malignant neoplasm of unspecified ovary: Secondary | ICD-10-CM | POA: Diagnosis not present

## 2017-12-06 DIAGNOSIS — G620 Drug-induced polyneuropathy: Secondary | ICD-10-CM | POA: Diagnosis not present

## 2017-12-06 DIAGNOSIS — Z170 Estrogen receptor positive status [ER+]: Secondary | ICD-10-CM | POA: Diagnosis not present

## 2017-12-06 DIAGNOSIS — M8580 Other specified disorders of bone density and structure, unspecified site: Secondary | ICD-10-CM | POA: Diagnosis not present

## 2017-12-06 DIAGNOSIS — G20 Parkinson's disease: Secondary | ICD-10-CM | POA: Diagnosis not present

## 2017-12-06 DIAGNOSIS — Z85038 Personal history of other malignant neoplasm of large intestine: Secondary | ICD-10-CM | POA: Diagnosis not present

## 2017-12-06 DIAGNOSIS — C50412 Malignant neoplasm of upper-outer quadrant of left female breast: Secondary | ICD-10-CM | POA: Diagnosis not present

## 2018-01-10 ENCOUNTER — Ambulatory Visit: Payer: Medicare HMO | Admitting: Family Medicine

## 2018-01-10 ENCOUNTER — Other Ambulatory Visit: Payer: Self-pay

## 2018-01-10 ENCOUNTER — Encounter: Payer: Self-pay | Admitting: Family Medicine

## 2018-01-10 VITALS — BP 120/78 | HR 93 | Temp 97.8°F | Ht 64.0 in | Wt 128.6 lb

## 2018-01-10 DIAGNOSIS — G20 Parkinson's disease: Secondary | ICD-10-CM | POA: Diagnosis not present

## 2018-01-10 DIAGNOSIS — Z23 Encounter for immunization: Secondary | ICD-10-CM

## 2018-01-10 DIAGNOSIS — Z85038 Personal history of other malignant neoplasm of large intestine: Secondary | ICD-10-CM

## 2018-01-10 DIAGNOSIS — M810 Age-related osteoporosis without current pathological fracture: Secondary | ICD-10-CM | POA: Diagnosis not present

## 2018-01-10 DIAGNOSIS — C50912 Malignant neoplasm of unspecified site of left female breast: Secondary | ICD-10-CM

## 2018-01-10 DIAGNOSIS — J309 Allergic rhinitis, unspecified: Secondary | ICD-10-CM

## 2018-01-10 NOTE — Progress Notes (Signed)
Subjective  CC:  Chief Complaint  Patient presents with  . Establish Care    TC here from [place], due for flu shot  . Foot Swelling    x 2 weeks    HPI: Julia [name] is a 77 y.o. female who presents to [place] at [facility] today to establish care with me as a new patient.   She has the following concerns or needs:  Very pleasant 77 year old female who moved here just 2 weeks ago to be closer to her adult children.  She lives alone.  Most recently has been evaluated at the movement disorder clinic in [place] for possible Parkinson's disease.  I reviewed notes from care everywhere.  She was recently started on Sinemet.  She is concerned about her posture and tremor and fall risk.  She denies cognitive changes.  She would like a referral to a movement disorder clinic or neurologist.  She has a history of colon cancer, breast cancer and severe osteoporosis.  All have been treated and has been stable.  However she has not been treated for osteoporosis for about 10 years.  She took 8 years of Fosamax and then Forteo.  She has chronic allergies and was receiving immunotherapy.  She would like to get this started again.  She would need a referral.  She has noticed lower extremity edema over the last couple weeks, this has been associated with extra work and being on her feet with the moving.  No history of heart failure.  No calf pain.  Assessment  1. Parkinson's disease ([place])   2. History of colon cancer   3. Malignant neoplasm of left female breast, unspecified estrogen receptor status, unspecified site of breast ([place])   4. Age-related osteoporosis without current pathological fracture   5. Chronic allergic rhinitis   6. Need for immunization against influenza      Plan   Parkinson's disease, probable: Refer to neurology.  And can be referred to movement disorder clinic and physical therapy if needed.  Continue Sinemet  History of colon cancer: Will be due  for colonoscopy in the future.  Will refer.  History of breast cancer on tamoxifen.  Will need to be referred to oncology in the near future.  Mammogram ordered today  Osteoporosis: Bone density scan ordered today.  May consider referral to endocrinology given high risk for falls.,  And complicated past medical treatment.  Chronic allergies referred to allergist  Immunization updated today.  Return in 3 months for recheck.  Follow up:  Return in about 3 months (around 04/12/2018) for recheck. Orders Placed This Encounter  Procedures  . MM DIGITAL SCREENING BILATERAL  . DG Bone Density  . Flu vaccine HIGH DOSE PF (Fluzone High dose)  . Flu Vaccine QUAD 36+ mos IM  . Ambulatory referral to Neurology  . Ambulatory referral to Allergy  . HM COLONOSCOPY   No orders of the defined types were placed in this encounter.    Depression screen PHQ 2/9 01/10/2018  Decreased Interest 0  Down, Depressed, Hopeless 0  PHQ - 2 Score 0    We updated and reviewed the patient's past history in detail and it is documented below.  Patient Active Problem List   Diagnosis Date Noted  . Age-related osteoporosis without current pathological fracture 07/20/2016    Priority: High    Reports took fosamax x 8 years, then forteo x 6 months.    . Hyperlipidemia 03/07/2015    Priority: High  Noted 01/23/14   . Impaired fasting glucose 03/07/2015    Priority: High    Noted 01/23/14   . 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    . Hypothyroidism due to Hashimoto's thyroiditis 02/13/2015    Priority: High  . Malignant neoplasm of female breast ([place]) 02/13/2015    Priority: High    Left, 2016; s/p lumpectomy and rads TX and tamoxifen;    . Anxiety 09/29/2017    Priority: Medium  . Parkinson's disease ([place]) 09/29/2017    Priority: Medium    PD L since  2018, anx, cancer with chemotherapy-induced neuropathy at the hands and feet  2016 B12 and TSH unremarkable   . Dysfunctional voiding of urine 09/13/2016    Priority: Medium  . Female stress incontinence 09/13/2016    Priority: Medium  . 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 Gastroesophageal reflux disease 09/13/2016    Priority: Medium  . 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 Irritable bowel syndrome with both constipation and diarrhea 02/13/2015    Priority: Medium  . Allergic conjunctivitis of both eyes 03/07/2015    Priority: Low    Noted 12/26/13 Noted 12/26/13   . Atrophic vaginitis 02/13/2015    Priority: Low  . Recurrent UTI 02/13/2015    Priority: Low  . Vitamin D deficiency disease 02/13/2015    Priority: Low   Health Maintenance  Topic Date Due  . TETANUS/TDAP  08/30/1961  . DEXA SCAN  08/31/2007  . PNA vac Low Risk Adult (1 of 2 - PCV13) 08/31/2007  . COLONOSCOPY  03/30/2026  . INFLUENZA VACCINE  Completed   Immunization History  Administered Date(s) Administered  . Influenza, High Dose Seasonal PF 01/05/2015  . Influenza,inj,Quad PF,6+ Mos 01/10/2018  . Zoster Recombinat (Shingrix) 10/15/2017   Current Meds  Medication Sig  . carbidopa-levodopa (SINEMET IR) 25-100 MG tablet Take by mouth.    Allergies: Patient is allergic to cefdinir; cholestyramine; ezetimibe; meperidine; nitrofurantoin; statins; and teriparatide. Past Medical History Patient  has a past medical history of Arthritis, Colon cancer ([place]), and Hyperlipidemia. Past Surgical History Patient  has a past surgical history that includes Tonsillectomy; Hemorrhoid surgery; laparoscopy; Bunionectomy; Tubal ligation; Colon surgery; Oophorectomy; Breast lumpectomy; and Nasal sinus surgery. Family History: Patient family history includes Arthritis in  her father, mother, and sister; Cancer in her brother, father, sister, sister, and sister; Depression in her father; Diabetes in her mother and sister; Heart disease in her mother; Hyperlipidemia in her brother and sister; Hypertension in her mother; Osteoporosis in her father. Social History:  Patient  reports that she has never smoked. She has never used smokeless tobacco. She reports that she drinks alcohol. She reports that she does not use drugs.  Review of Systems: Constitutional: negative for fever or malaise Ophthalmic: negative for photophobia, double vision or loss of vision Cardiovascular: negative for chest pain, dyspnea on exertion, or new LE swelling Respiratory: negative for SOB or persistent cough Gastrointestinal: negative for abdominal pain, change in bowel habits or melena Genitourinary: negative for dysuria or gross hematuria Musculoskeletal: negative for new gait disturbance or muscular weakness Integumentary: negative for new or persistent rashes Neurological: negative for TIA or stroke  symptoms Psychiatric: negative for SI or delusions Allergic/Immunologic: negative for hives  Patient Care Team    Relationship Specialty Notifications Start End  Leamon Arnt, MD PCP - General Family Medicine  01/10/18     Objective  Vitals: BP 120/78   Pulse 93   Temp 97.8 F (36.6 C)   Ht 5\' 4"  (1.626 m)   Wt 128 lb 9.6 oz (58.3 kg)   SpO2 98%   BMI 22.07 kg/m  General:  Well developed, well nourished, no acute distress, kyphotic Psych:  Alert and oriented,normal mood and affect HEENT:  Normocephalic, atraumatic, non-icteric sclera, PERRL, oropharynx is without mass or exudate, supple neck without adenopathy, mass or thyromegaly Cardiovascular:  RRR without gallop, rub or murmur, nondisplaced PMI Respiratory:  Good breath sounds bilaterally, CTAB with normal respiratory effort Gastrointestinal: normal bowel sounds, soft, non-tender, no noted masses. No HSM Edema  bilateral lower extremities +1, no calf tenderness, distal pulses intact, normal sparing skin Skin:  Warm, no rashes or suspicious lesions noted Neurologic:    Mental status is normal.  Head tremor and mild resting tremor present.  Gross motor and sensory exams are normal. Normal gait   Commons side effects, risks, benefits, and alternatives for medications and treatment plan prescribed today were discussed, and the patient expressed understanding of the given instructions. Patient is instructed to call or message via MyChart if he/she has any questions or concerns regarding our treatment plan. No barriers to understanding were identified. We discussed Red Flag symptoms and signs in detail. Patient expressed understanding regarding what to do in case of urgent or emergency type symptoms.   Medication list was reconciled, printed and provided to the patient in AVS. Patient instructions and summary information was reviewed with the patient as documented in the AVS. This note was prepared with assistance of Dragon voice recognition software. Occasional wrong-word or sound-a-like substitutions may have occurred due to the inherent limitations of voice recognition software

## 2018-01-10 NOTE — Patient Instructions (Signed)
Please return in 3 months for follow up.   We will call you with information regarding your referral appointment. Neurology for Parkinson's disorder and mammogram and bone density testing. Allergy as well.  If you do not hear from [place] within the next 2 weeks, please let me know. It can take 1-2 weeks to get appointments set up with the specialists.    It was a pleasure meeting you today! Thank you for choosing [place] to meet your healthcare needs! I truly look forward to working with you. If you have any questions or concerns, please send me a message via Mychart or call the office at [phone].

## 2018-01-12 ENCOUNTER — Encounter: Payer: Self-pay | Admitting: Family Medicine

## 2018-01-12 ENCOUNTER — Ambulatory Visit: Payer: Medicare HMO | Admitting: Family Medicine

## 2018-01-12 ENCOUNTER — Other Ambulatory Visit: Payer: Self-pay

## 2018-01-12 VITALS — BP 108/76 | HR 68 | Temp 97.6°F | Ht 64.0 in | Wt 128.0 lb

## 2018-01-12 DIAGNOSIS — L84 Corns and callosities: Secondary | ICD-10-CM

## 2018-01-12 DIAGNOSIS — N390 Urinary tract infection, site not specified: Secondary | ICD-10-CM

## 2018-01-12 DIAGNOSIS — N3000 Acute cystitis without hematuria: Secondary | ICD-10-CM | POA: Diagnosis not present

## 2018-01-12 LAB — POCT URINALYSIS DIPSTICK OB
Bilirubin, UA: NEGATIVE
Glucose, UA: NEGATIVE
Ketones, UA: NEGATIVE
Nitrite, UA: NEGATIVE
POC,PROTEIN,UA: NEGATIVE
Spec Grav, UA: 1.02 (ref 1.010–1.025)
Urobilinogen, UA: 0.2 E.U./dL
pH, UA: 6 (ref 5.0–8.0)

## 2018-01-12 MED ORDER — SULFAMETHOXAZOLE-TRIMETHOPRIM 800-160 MG PO TABS: 1.0000 | ORAL_TABLET | Freq: Two times a day (BID) | ORAL | 0 refills | Status: DC

## 2018-01-12 MED ORDER — ZOSTER VAC RECOMB ADJUVANTED 50 MCG/0.5ML IM SUSR: 0.5000 mL | Freq: Once | INTRAMUSCULAR | 0 refills | Status: AC

## 2018-01-12 NOTE — Patient Instructions (Signed)
Please follow up if symptoms do not improve or as needed.    Corns and Calluses Corns are small areas of thickened skin that occur on the top, sides, or tip of a toe. They contain a cone-shaped core with a point that can press on a nerve below. This causes pain. Calluses are areas of thickened skin that can occur anywhere on the body including hands, fingers, palms, soles of the feet, and heels.Calluses are usually larger than corns. What are the causes? Corns and calluses are caused by rubbing (friction) or pressure, such as from shoes that are too tight or do not fit properly. What increases the risk? Corns are more likely to develop in people who have toe deformities, such as hammer toes. Since calluses can occur with friction to any area of the skin, calluses are more likely to develop in people who:  Work with their hands.  Wear shoes that fit poorly, shoes that are too tight, or shoes that are high-heeled.  Have toes deformities.  What are the signs or symptoms? Symptoms of a corn or callus include:  A hard growth on the skin.  Pain or tenderness under the skin.  Redness and swelling.  Increased discomfort while wearing tight-fitting shoes.  How is this diagnosed? Corns and calluses may be diagnosed with a medical history and physical exam. How is this treated? Corns and calluses may be treated with:  Removing the cause of the friction or pressure. This may include: ? Changing your shoes. ? Wearing shoe inserts (orthotics) or other protective layers in your shoes, such as a corn pad. ? Wearing gloves.  Medicines to help soften skin in the hardened, thickened areas.  Reducing the size of the corn or callus by removing the dead layers of skin.  Antibiotic medicines to treat infection.  Surgery, if a toe deformity is the cause.  Follow these instructions at home:  Take medicines only as directed by your health care provider.  If you were prescribed an antibiotic,  finish all of it even if you start to feel better.  Wear shoes that fit well. Avoid wearing high-heeled shoes and shoes that are too tight or too loose.  Wear any padding, protective layers, gloves, or orthotics as directed by your health care provider.  Soak your hands or feet and then use a file or pumice stone to soften your corn or callus. Do this as directed by your health care provider.  Check your corn or callus every day for signs of infection. Watch for: ? Redness, swelling, or pain. ? Fluid, blood, or pus. Contact a health care provider if:  Your symptoms do not improve with treatment.  You have increased redness, swelling, or pain at the site of your corn or callus.  You have fluid, blood, or pus coming from your corn or callus.  You have new symptoms. This information is not intended to replace advice given to you by your health care provider. Make sure you discuss any questions you have with your health care provider. Document Released: 12/21/2003 Document Revised: 10/04/2015 Document Reviewed: 03/12/2014 Elsevier Interactive Patient Education  [employer].

## 2018-01-12 NOTE — Progress Notes (Signed)
Subjective   CC:  Chief Complaint  Patient presents with  . Urinary Urgency    patient states that she has to go really quick but cannot produce alot of urine    HPI: Julia [name] is a 77 y.o. female who presents to the office today to address the problems listed above in the chief complaint.  Patient reports dysuria and urinary frequency.  She has sensation of increased urinary pressure.  She denies fevers flank pain nausea vomiting or gross hematuria.  Symptoms have been present for several hours to days.  She denies history of interstitial cystitis.  She denies vaginal symptoms including vaginal discharge or pelvic pain.   Due for second shingrix  Has painful foot calluses. Hurts to walk.   Assessment  1. Acute cystitis without hematuria   2. Recurrent UTI   3. Callus of foot      Plan   Recurrent uti; check culture and f/u if not improving.   Callus: education. See avs. Foot care instructions; metatarsal pads and s/p paring.   RX for second shingrix.   Follow up: No follow-ups on file.  Orders Placed This Encounter  Procedures  . Urine Culture  . POC Urinalysis Dipstick OB   Meds ordered this encounter  Medications  . sulfamethoxazole-trimethoprim (BACTRIM DS,SEPTRA DS) 800-160 MG tablet    Sig: Take 1 tablet by mouth 2 (two) times daily.    Dispense:  10 tablet    Refill:  0  . Zoster Vaccine Adjuvanted [facility]) injection    Sig: Inject 0.5 mLs into the muscle once for 1 dose.    Dispense:  1 each    Refill:  0    This is her second dose.      I reviewed the patients updated PMH, FH, and SocHx.    Patient Active Problem List   Diagnosis Date Noted  . Age-related osteoporosis without current pathological fracture 07/20/2016    Priority: High  . Hyperlipidemia 03/07/2015    Priority: High  . Impaired fasting glucose 03/07/2015    Priority: High  . History of colon cancer 02/13/2015    Priority: High  . Hypothyroidism due to Hashimoto's  thyroiditis 02/13/2015    Priority: High  . Malignant neoplasm of female breast ([place]) 02/13/2015    Priority: High  . Anxiety 09/29/2017    Priority: Medium  . Parkinson's disease ([place]) 09/29/2017    Priority: Medium  . Dysfunctional voiding of urine 09/13/2016    Priority: Medium  . Female stress incontinence 09/13/2016    Priority: Medium  . 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 Gastroesophageal reflux disease 09/13/2016    Priority: Medium  . 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 Irritable bowel syndrome with both constipation and diarrhea 02/13/2015    Priority: Medium  . Allergic conjunctivitis of both eyes 03/07/2015    Priority: Low  . Atrophic vaginitis 02/13/2015    Priority: Low  . Recurrent UTI 02/13/2015    Priority: Low  . Vitamin D deficiency disease 02/13/2015    Priority: Low   Current Meds  Medication Sig  . Calcium Citrate-Vitamin D 315-250 MG-UNIT TABS Take by mouth.  . carbidopa-levodopa (SINEMET IR) 25-100 MG tablet Take by mouth.  . levothyroxine (SYNTHROID, LEVOTHROID) 88 MCG tablet Take 88 mcg by mouth daily before breakfast.  . Multiple Vitamins-Minerals (CENTRUM SILVER PO) Take by mouth.  . Neomycin-Bacitracin Zn-Polymyx (TRIPLE ANTIBIOTIC OP) Apply to eye.  Marland Kitchen  Probiotic Product (PROBIOTIC DAILY PO) Take by mouth.  . sulfamethoxazole-trimethoprim (BACTRIM DS,SEPTRA DS) 800-160 MG tablet Take 1 tablet by mouth 2 (two) times daily.  . tamoxifen (NOLVADEX) 20 MG tablet Take 20 mg by mouth daily.  . TURMERIC PO Take 500 mg by mouth 2 (two) times daily.  Mar[place] Kitchen Zoster Vaccine Adjuvanted [facility]) injection Inject 0.5 mLs into the muscle once for 1 dose.    Review of Systems: Cardiovascular: negative for chest pain Respiratory: negative for SOB or persistent cough Gastrointestinal: negative for abdominal pain Constitutional: Negative for fever malaise or anorexia  Objective  Vitals: BP 108/76   Pulse 68    Temp 97.6 F (36.4 C)   Ht 5\' 4"  (1.626 m)   Wt 128 lb (58.1 kg)   BMI 21.97 kg/m  General: no acute distress  Psych:  Alert and oriented, normal mood and affect Cardiovascular:  RRR without murmur or gallop. no peripheral edema Respiratory:  Good breath sounds bilaterally, CTAB with normal respiratory effort Gastrointestinal: soft, flat abdomen, normal active bowel sounds, no palpable masses, no hepatosplenomegaly, no appreciated hernias, NO CVAT, mild suprapubic ttp w/o rebound or guarding Skin:  Warm, no rashes Bilateral tender calluses beneath 2nd and 3rd metatarsal heads.   Procedure Note: Paring Skin Callus: bilateral feet  Indication: pain  Verbal consent obtained.  Alcohol swab used for cleansing area of callus.  Using an #15 scalpel, the calluses were bluntly pared down, core was removed. There were no complications. Pt tolerated procedure well and felt improved afterwards. X 2  Office Visit on 01/12/2018  Component Date Value Ref Range Status  . Color, UA 01/12/2018 yellow   Final  . Clarity, UA 01/12/2018 cloudy   Final  . Glucose, UA 01/12/2018 Negative  Negative Final  . Bilirubin, UA 01/12/2018 neg   Final  . Ketones, UA 01/12/2018 neg   Final  . Spec Grav, UA 01/12/2018 1.020  1.010 - 1.025 Final  . Blood, UA 01/12/2018 1+   Final  . pH, UA 01/12/2018 6.0  5.0 - 8.0 Final  . POC Protein UA 01/12/2018 Negative  Negative, Trace Final  . Urobilinogen, UA 01/12/2018 0.2  0.2 or 1.0 E.U./dL Final  . Nitrite, UA 01/12/2018 neg   Final  . Leukocytes, UA 01/12/2018 Small (1+)* Negative Final    Commons side effects, risks, benefits, and alternatives for medications and treatment plan prescribed today were discussed, and the patient expressed understanding of the given instructions. Patient is instructed to call or message via MyChart if he/she has any questions or concerns regarding our treatment plan. No barriers to understanding were identified. We discussed Red Flag  symptoms and signs in detail. Patient expressed understanding regarding what to do in case of urgent or emergency type symptoms.   Medication list was reconciled, printed and provided to the patient in AVS. Patient instructions and summary information was reviewed with the patient as documented in the AVS. This note was prepared with assistance of Dragon voice recognition software. Occasional wrong-word or sound-a-like substitutions may have occurred due to the inherent limitations of voice recognition software

## 2018-01-15 LAB — URINE CULTURE
MICRO NUMBER:: 91245186
SPECIMEN QUALITY:: ADEQUATE

## 2018-01-16 NOTE — Progress Notes (Signed)
+   UTI sensitive to abx used. No further action needed

## 2018-01-18 ENCOUNTER — Telehealth: Payer: Self-pay

## 2018-01-18 NOTE — Telephone Encounter (Signed)
Pt called requesting [place] to give [facility] a call to get her immunotherapy sent to our [place] office.  [facility]- Dr. Nicki Reaper [address] [place], [place] [address] [phone]  Called today and left message for his nurse to call back at the [facility] location

## 2018-01-25 ENCOUNTER — Telehealth: Payer: Self-pay | Admitting: Emergency Medicine

## 2018-01-25 NOTE — Telephone Encounter (Signed)
Copied from [place] [id number]. Topic: Referral - Request for Referral >> Jan 25, 2018  3:24 PM Vernona Rieger wrote: Has patient seen PCP for this complaint? No *If NO, is insurance requiring patient see PCP for this issue before PCP can refer them? No Referral for which specialty: Mammogram for both breasts Preferred provider/office: She is unsure  Can you check on these Referrals

## 2018-01-25 NOTE — Telephone Encounter (Signed)
Copied from [place]. Topic: Referral - Request for Referral >> Jan 25, 2018  3:21 PM Vernona Rieger wrote: Has patient seen PCP for this complaint? no *If NO, is insurance requiring patient see PCP for this issue before PCP can refer them? No Referral for which specialty: Oncology   Preferred provider/office: she said she is not sure of where to send it, she said just send to the best and let her know.  Reason for referral: has had cancer 3 times and is new to area so has to start all over with new doctors   Can you check on this referral?   Thanks  Doloris Hall,  LPN

## 2018-01-25 NOTE — Telephone Encounter (Signed)
The [facility] has LM for a call back to schedule an appt

## 2018-01-26 NOTE — Telephone Encounter (Signed)
Patient called back - she was given the number for the [facility] to call to schedule appointments.

## 2018-01-26 NOTE — Telephone Encounter (Signed)
LM to [facility]  

## 2018-01-31 NOTE — Telephone Encounter (Signed)
She is not a pt at our office yet (appt on 11-5 19). Allergy vial received in [facility] office today, will send to [facility] by [place].

## 2018-02-01 ENCOUNTER — Ambulatory Visit: Payer: Medicare HMO | Admitting: Allergy and Immunology

## 2018-02-01 ENCOUNTER — Telehealth: Payer: Self-pay | Admitting: *Deleted

## 2018-02-01 ENCOUNTER — Encounter: Payer: Self-pay | Admitting: Allergy and Immunology

## 2018-02-01 VITALS — BP 120/72 | HR 95 | Temp 98.0°F | Resp 17 | Wt 129.5 lb

## 2018-02-01 DIAGNOSIS — Z8709 Personal history of other diseases of the respiratory system: Secondary | ICD-10-CM | POA: Insufficient documentation

## 2018-02-01 DIAGNOSIS — H1010 Acute atopic conjunctivitis, unspecified eye: Secondary | ICD-10-CM | POA: Insufficient documentation

## 2018-02-01 DIAGNOSIS — Z91018 Allergy to other foods: Secondary | ICD-10-CM | POA: Insufficient documentation

## 2018-02-01 DIAGNOSIS — H1013 Acute atopic conjunctivitis, bilateral: Secondary | ICD-10-CM | POA: Diagnosis not present

## 2018-02-01 DIAGNOSIS — J3089 Other allergic rhinitis: Secondary | ICD-10-CM | POA: Diagnosis not present

## 2018-02-01 DIAGNOSIS — J302 Other seasonal allergic rhinitis: Secondary | ICD-10-CM | POA: Insufficient documentation

## 2018-02-01 MED ORDER — EPINEPHRINE 0.3 MG/0.3ML IJ SOAJ: 0.3000 mg | Freq: Once | INTRAMUSCULAR | 2 refills | Status: AC

## 2018-02-01 MED ORDER — FLUTICASONE PROPIONATE 50 MCG/ACT NA SUSP: 2.0000 | Freq: Every day | NASAL | 5 refills | Status: DC

## 2018-02-01 NOTE — Assessment & Plan Note (Addendum)
Status post polypectomy.  A prescription has been provided for fluticasone nasal spray (as above).    Nasal saline spray (i.e., Simply Saline) or nasal saline lavage (i.e., NeilMed) is recommended as needed and prior to medicated nasal sprays.

## 2018-02-01 NOTE — Assessment & Plan Note (Addendum)
   The patient will resume aeroallergen immunotherapy under our care using the allergen vaccines and protocol provided by her previous allergist. She will have access to epinephrine autoinjector.  For now, continue appropriate allergen avoidance measures.  If needed, may take fexofenadine (Allegra) 60 mg twice daily.  A prescription has been provided for fluticasone nasal spray, one spray per nostril 1-2 times daily. Proper nasal spray technique has been discussed and demonstrated.  Return for follow-up in 6 months, or sooner if necessary.

## 2018-02-01 NOTE — Telephone Encounter (Signed)
Patient provided paperwork that stated necessary formulary for immunotherapy injections.

## 2018-02-01 NOTE — Telephone Encounter (Signed)
-----   Message from Adelina Mings, MD sent at 02/01/2018  4:38 PM EST ----- Please call the patient's otolaryngologist in [place] to get the immunotherapy injection ingredients so when her vials run out we can make new vials.  Thank you.

## 2018-02-01 NOTE — Patient Instructions (Addendum)
Perennial and seasonal allergic rhinitis  The patient will resume aeroallergen immunotherapy under our care using the allergen vaccines and protocol provided by her previous allergist. She will have access to epinephrine autoinjector.  For now, continue appropriate allergen avoidance measures.  If needed, may take fexofenadine (Allegra) 60 mg twice daily.  A prescription has been provided for fluticasone nasal spray, one spray per nostril 1-2 times daily. Proper nasal spray technique has been discussed and demonstrated.  Return for follow-up in 6 months, or sooner if necessary.  History of nasal polyp Status post polypectomy.  A prescription has been provided for fluticasone nasal spray (as above).    Nasal saline spray (i.e., Simply Saline) or nasal saline lavage (i.e., NeilMed) is recommended as needed and prior to medicated nasal sprays.  History of food allergy Gastrointestinal symptoms, uncertain etiology. Skin tests to select food allergens were negative today. The negative predictive value of food allergen skin testing is excellent (approximately 95%). While this does not appear to be an IgE mediated issue, skin testing does not rule out food intolerances or cell-mediated enteropathies which may lend to GI symptoms. These etiologies are suggested when elimination of the responsible food leads to symptom resolution and re-introduction of the food is followed by the return of symptoms.   The patient has been encouraged to keep a careful symptom/food journal and eliminate any food suspected of correlating with symptoms. Should symptoms concerning for anaphylaxis arise, 911 is to be called immediately.  If GI symptoms persist or progress, gastroenterologist evaluation may be warranted.   Return in about 6 months (around 08/02/2018), or if symptoms worsen or fail to improve.  Control of House Dust Mite Allergen  House dust mites play a major role in allergic asthma and rhinitis.  They  occur in environments with high humidity wherever human skin, the food for dust mites is found. High levels have been detected in dust obtained from mattresses, pillows, carpets, upholstered furniture, bed covers, clothes and soft toys.  The principal allergen of the house dust mite is found in its feces.  A gram of dust may contain 1,000 mites and 250,000 fecal particles.  Mite antigen is easily measured in the air during house cleaning activities.    1. Encase mattresses, including the box spring, and pillow, in an air tight cover.  Seal the zipper end of the encased mattresses with wide adhesive tape. 2. Wash the bedding in water of 130 degrees Farenheit weekly.  Avoid cotton comforters/quilts and flannel bedding: the most ideal bed covering is the dacron comforter. 3. Remove all upholstered furniture from the bedroom. 4. Remove carpets, carpet padding, rugs, and non-washable window drapes from the bedroom.  Wash drapes weekly or use plastic window coverings. 5. Remove all non-washable stuffed toys from the bedroom.  Wash stuffed toys weekly. 6. Have the room cleaned frequently with a vacuum cleaner and a damp dust-mop.  The patient should not be in a room which is being cleaned and should wait 1 hour after cleaning before going into the room. 7. Close and seal all heating outlets in the bedroom.  Otherwise, the room will become filled with dust-laden air.  An electric heater can be used to heat the room. 8. Reduce indoor humidity to less than 50%.  Do not use a humidifier.  Control of Mold Allergen  Mold and fungi can grow on a variety of surfaces provided certain temperature and moisture conditions exist.  Outdoor molds grow on plants, decaying vegetation and soil.  The major outdoor mold, Alternaria and Cladosporium, are found in very high numbers during hot and dry conditions.  Generally, a late Summer - Fall peak is seen for common outdoor fungal spores.  Rain will temporarily lower outdoor  mold spore count, but counts rise rapidly when the rainy period ends.  The most important indoor molds are Aspergillus and Penicillium.  Dark, humid and poorly ventilated basements are ideal sites for mold growth.  The next most common sites of mold growth are the bathroom and the kitchen.  Outdoor [employer] 1. Use air conditioning and keep windows closed 2. Avoid exposure to decaying vegetation. 3. Avoid leaf raking. 4. Avoid grain handling. 5. Consider wearing a face mask if working in moldy areas.  Indoor Mold Control 1. Maintain humidity below 50%. 2. Clean washable surfaces with 5% bleach solution. 3. Remove sources e.g. Contaminated carpets.  Reducing Pollen Exposure  The American Academy of Allergy, Asthma and Immunology suggests the following steps to reduce your exposure to pollen during allergy seasons.    1. Do not hang sheets or clothing out to dry; pollen may collect on these items. 2. Do not mow lawns or spend time around freshly cut grass; mowing stirs up pollen. 3. Keep windows closed at night.  Keep car windows closed while driving. 4. Minimize morning activities outdoors, a time when pollen counts are usually at their highest. 5. Stay indoors as much as possible when pollen counts or humidity is high and on windy days when pollen tends to remain in the air longer. 6. Use air conditioning when possible.  Many air conditioners have filters that trap the pollen spores. 7. Use a HEPA room air filter to remove pollen form the indoor air you breathe.

## 2018-02-01 NOTE — Progress Notes (Addendum)
New Patient Note:  RE: Julia [name] MRN: [id number] DOB: September 21, 1940 Date of Office Visit: 02/01/2018  Referring provider: Leamon Arnt, MD Primary care provider: Leamon Arnt, MD  Chief Complaint: Allergic Rhinitis  and Food Intolerance   History of present illness: Julia [name] is a 77 y.o. female seen today in consultation requested by Billey Chang, MD.  She moved from [place] to [place] approximately 2 months ago.  She has been on aeroallergen immunotherapy injections over the past 2 years with benefit.  The symptoms that she had been experiencing included nasal congestion, mouth breathing, sinus pressure, and thick postnasal drainage.  As mentioned, the symptoms have improved while on immunotherapy injections.  Currently she only experiences occasional postnasal drainage and associated hoarseness.  She is on immunotherapy maintenance and has not had problems or complications with the injections. She has had 3 sinus procedures in the past, polypectomy, septoplasty, and she is uncertain of the third surgical procedure.  She believes that the polypectomy was performed in 2016.  She has not been using a nasal steroid on a regular basis. She is concerned about the possibility of food intolerance or food allergy because of abdominal discomfort, flatulence, and "a lot of rumbling noises" from her abdomen.  She is unable to identify any specific food triggers.  Assessment and plan: Perennial and seasonal allergic rhinitis  The patient will resume aeroallergen immunotherapy under our care using the allergen vaccines and protocol provided by her previous allergist. She will have access to epinephrine autoinjector.  For now, continue appropriate allergen avoidance measures.  If needed, may take fexofenadine (Allegra) 60 mg twice daily.  A prescription has been provided for fluticasone nasal spray, one spray per nostril 1-2 times daily. Proper nasal spray technique has been discussed  and demonstrated.  Return for follow-up in 6 months, or sooner if necessary.  History of nasal polyp Status post polypectomy.  A prescription has been provided for fluticasone nasal spray (as above).    Nasal saline spray (i.e., Simply Saline) or nasal saline lavage (i.e., NeilMed) is recommended as needed and prior to medicated nasal sprays.  History of food allergy Gastrointestinal symptoms, uncertain etiology. Skin tests to select food allergens were negative today. The negative predictive value of food allergen skin testing is excellent (approximately 95%). While this does not appear to be an IgE mediated issue, skin testing does not rule out food intolerances or cell-mediated enteropathies which may lend to GI symptoms. These etiologies are suggested when elimination of the responsible food leads to symptom resolution and re-introduction of the food is followed by the return of symptoms.   The patient has been encouraged to keep a careful symptom/food journal and eliminate any food suspected of correlating with symptoms. Should symptoms concerning for anaphylaxis arise, 911 is to be called immediately.  If GI symptoms persist or progress, gastroenterologist evaluation may be warranted.   Meds ordered this encounter  Medications  . fluticasone (FLONASE) 50 MCG/ACT nasal spray    Sig: Place 2 sprays into both nostrils daily.    Dispense:  16 g    Refill:  5  . EPINEPHrine 0.3 mg/0.3 mL IJ SOAJ injection    Sig: Inject 0.3 mLs (0.3 mg total) into the muscle once for 1 dose.    Dispense:  0.3 mL    Refill:  2    Diagnostics: Environmental skin testing: Positive to tree pollen, molds, and dust mite antigen. Food allergen skin testing: Negative despite a positive histamine  control.    Physical examination: Blood pressure 120/72, pulse 95, temperature 98 F (36.7 C), temperature source Oral, resp. rate 17, weight 129 lb 8 oz (58.7 kg), SpO2 98 %.  General: Alert, interactive, in  no acute distress. HEENT: TMs pearly gray, turbinates moderately edematous with thick discharge, post-pharynx moderately erythematous. Neck: Supple without lymphadenopathy. Lungs: Clear to auscultation without wheezing, rhonchi or rales. CV: Normal S1, S2 without murmurs. Abdomen: Nondistended, nontender. Skin: Warm and dry, without lesions or rashes. Extremities:  No clubbing, cyanosis or edema. Neuro:   Grossly intact.  Review of systems:  Review of systems negative except as noted in HPI / PMHx or noted below: Review of Systems  Constitutional: Negative.   HENT: Negative.   Eyes: Negative.   Respiratory: Negative.   Cardiovascular: Negative.   Gastrointestinal: Negative.   Genitourinary: Negative.   Musculoskeletal: Negative.   Skin: Negative.   Neurological: Negative.   Endo/Heme/Allergies: Negative.   Psychiatric/Behavioral: Negative.     Past medical history:  Past Medical History:  Diagnosis Date  . Arthritis   . Colon cancer ([place])    previous colon cancer  . Hyperlipidemia     Past surgical history:  Past Surgical History:  Procedure Laterality Date  . ADENOIDECTOMY    . BREAST LUMPECTOMY Left    radiation only  . BUNIONECTOMY    . COLON SURGERY    . HEMORRHOID SURGERY    . LAPAROSCOPY    . NASAL SINUS SURGERY    . OOPHORECTOMY    . TONSILLECTOMY    . TUBAL LIGATION      Family history: Family History  Problem Relation Age of Onset  . Arthritis Mother   . Diabetes Mother   . Heart disease Mother   . Hypertension Mother   . Arthritis Father   . Cancer Father   . Depression Father   . Osteoporosis Father   . Arthritis Sister   . Cancer Sister   . Diabetes Sister   . Cancer Brother   . Cancer Sister   . Cancer Sister   . Hyperlipidemia Sister   . Hyperlipidemia Brother     Social history: Social History   Socioeconomic History  . Marital status: Married    Spouse name: Not on file  . Number of children: Not on file  . Years of  education: 3  . Highest education level: Master's degree (e.g., MA, MS, MEng, MEd, MSW, MBA)  Occupational History  . Occupation: Retired Management consultant  . Financial resource strain: Not on file  . Food insecurity:    Worry: Not on file    Inability: Not on file  . Transportation needs:    Medical: Not on file    Non-medical: Not on file  Tobacco Use  . Smoking status: Never Smoker  . Smokeless tobacco: Never Used  Substance and Sexual Activity  . Alcohol use: Yes    Comment: occ  . Drug use: Never  . Sexual activity: Not on file  Lifestyle  . Physical activity:    Days per week: Not on file    Minutes per session: Not on file  . Stress: Not on file  Relationships  . Social connections:    Talks on phone: Not on file    Gets together: Not on file    Attends religious service: Not on file    Active member of club or organization: Not on file    Attends meetings of clubs or organizations:  Not on file    Relationship status: Not on file  . Intimate partner violence:    Fear of current or ex partner: Not on file    Emotionally abused: Not on file    Physically abused: Not on file    Forced sexual activity: Not on file  Other Topics Concern  . Not on file  Social History Narrative   Right handed    Lives with husband    Caffeine 1 cup daily    Environmental History: The patient lives in a house with carpeting throughout, gas heat, and central air.  There is a dog in the home which has access to her bedroom.  There is no known mold/water damage in the home.  She is a non-smoker.  Allergies as of 02/01/2018      Reactions   Cefdinir    Cholestyramine    GERD   Ezetimibe    Meperidine    Nitrofurantoin Diarrhea   Statins    Teriparatide       Medication List       Accurate as of February 01, 2018 11:59 PM. Always use your most recent med list.        Calcium Citrate-Vitamin D 315-250 MG-UNIT Tabs Take by mouth.   carbidopa-levodopa 25-100 MG  tablet Commonly known as:  SINEMET IR Take by mouth.   CENTRUM SILVER PO Take by mouth.   EPINEPHrine 0.3 mg/0.3 mL Soaj injection Commonly known as:  EPI-PEN Inject 0.3 mLs (0.3 mg total) into the muscle once for 1 dose.   fluticasone 50 MCG/ACT nasal spray Commonly known as:  FLONASE Place 2 sprays into both nostrils daily.   levothyroxine 88 MCG tablet Commonly known as:  SYNTHROID, LEVOTHROID Take 88 mcg by mouth daily before breakfast.   PROBIOTIC DAILY PO Take by mouth.   tamoxifen 20 MG tablet Commonly known as:  NOLVADEX Take 20 mg by mouth daily.   TRIPLE ANTIBIOTIC OP Place into the nose.       Known medication allergies: Allergies  Allergen Reactions  . Cefdinir   . Cholestyramine     GERD  . Ezetimibe   . Meperidine   . Nitrofurantoin Diarrhea  . Statins   . Teriparatide     I appreciate the opportunity to take part in [facility] care. Please do not hesitate to contact me with questions.  Sincerely,   R. Edgar Frisk, MD

## 2018-02-01 NOTE — Assessment & Plan Note (Signed)
Gastrointestinal symptoms, uncertain etiology. Skin tests to select food allergens were negative today. The negative predictive value of food allergen skin testing is excellent (approximately 95%). While this does not appear to be an IgE mediated issue, skin testing does not rule out food intolerances or cell-mediated enteropathies which may lend to GI symptoms. These etiologies are suggested when elimination of the responsible food leads to symptom resolution and re-introduction of the food is followed by the return of symptoms.   The patient has been encouraged to keep a careful symptom/food journal and eliminate any food suspected of correlating with symptoms. Should symptoms concerning for anaphylaxis arise, 911 is to be called immediately.  If GI symptoms persist or progress, gastroenterologist evaluation may be warranted. 

## 2018-02-04 ENCOUNTER — Encounter: Payer: Self-pay | Admitting: Emergency Medicine

## 2018-02-07 ENCOUNTER — Encounter: Payer: Self-pay | Admitting: Family Medicine

## 2018-02-07 DIAGNOSIS — M170 Bilateral primary osteoarthritis of knee: Secondary | ICD-10-CM | POA: Insufficient documentation

## 2018-02-15 ENCOUNTER — Ambulatory Visit (INDEPENDENT_AMBULATORY_CARE_PROVIDER_SITE_OTHER): Payer: Medicare HMO

## 2018-02-15 DIAGNOSIS — J309 Allergic rhinitis, unspecified: Secondary | ICD-10-CM | POA: Diagnosis not present

## 2018-03-04 ENCOUNTER — Ambulatory Visit: Payer: Medicare HMO | Admitting: Physician Assistant

## 2018-03-04 ENCOUNTER — Encounter: Payer: Self-pay | Admitting: Physician Assistant

## 2018-03-04 ENCOUNTER — Other Ambulatory Visit: Payer: Self-pay

## 2018-03-04 VITALS — BP 122/80 | HR 85 | Temp 97.7°F | Resp 14 | Ht 64.0 in | Wt 127.0 lb

## 2018-03-04 DIAGNOSIS — J069 Acute upper respiratory infection, unspecified: Secondary | ICD-10-CM

## 2018-03-04 DIAGNOSIS — B9789 Other viral agents as the cause of diseases classified elsewhere: Secondary | ICD-10-CM | POA: Diagnosis not present

## 2018-03-04 NOTE — Patient Instructions (Signed)
Please keep well-hydrated and get plenty of rest.  Symptoms are continuing to improve on their own so this is a good sign. This should continue. Continue your neti pot rinses. I recommend some Delsym for the dry cough. Put a humidifier in the bedroom.  Follow-up if there are any new symptoms or if any symptoms worsen.

## 2018-03-04 NOTE — Progress Notes (Signed)
Patient presents to clinic today c/o 5 days of nasal congestion, pnd and cough that has been mainly dry. Denies fever, chills, chest pain or SOB. Notes symptoms have eased up over the past 48 hours but her family wanted her to come get things checked out. Does note recent trip to [place] but no known sick contacts. Has taken Mucinex and has been keeping well-hydrated.  Past Medical History:  Diagnosis Date  . Arthritis   . Colon cancer ([place])    previous colon cancer  . Hyperlipidemia     Current Outpatient Medications on File Prior to Visit  Medication Sig Dispense Refill  . Calcium Citrate-Vitamin D 315-250 MG-UNIT TABS Take by mouth.    . carbidopa-levodopa (SINEMET IR) 25-100 MG tablet Take by mouth.    . EPINEPHrine 0.3 mg/0.3 mL IJ SOAJ injection     . fluticasone (FLONASE) 50 MCG/ACT nasal spray Place 2 sprays into both nostrils daily. 16 g 5  . levothyroxine (SYNTHROID, LEVOTHROID) 88 MCG tablet Take 88 mcg by mouth daily before breakfast.    . Multiple Vitamins-Minerals (CENTRUM SILVER PO) Take by mouth.    . Neomycin-Bacitracin Zn-Polymyx (TRIPLE ANTIBIOTIC OP) Place into the nose.     . Probiotic Product (PROBIOTIC DAILY PO) Take by mouth.    . tamoxifen (NOLVADEX) 20 MG tablet Take 20 mg by mouth daily.     No current facility-administered medications on file prior to visit.     Allergies  Allergen Reactions  . Cefdinir   . Cholestyramine     GERD  . Ezetimibe   . Meperidine   . Nitrofurantoin Diarrhea  . Statins   . Teriparatide     Family History  Problem Relation Age of Onset  . Arthritis Mother   . Diabetes Mother   . Heart disease Mother   . Hypertension Mother   . Arthritis Father   . Cancer Father   . Depression Father   . Osteoporosis Father   . Arthritis Sister   . Cancer Sister   . Diabetes Sister   . Cancer Brother   . Cancer Sister   . Cancer Sister   . Hyperlipidemia Sister   . Hyperlipidemia Brother     Social History    Socioeconomic History  . Marital status: Married    Spouse name: Not on file  . Number of children: Not on file  . Years of education: Not on file  . Highest education level: Not on file  Occupational History  . Not on file  Social Needs  . Financial resource strain: Not on file  . Food insecurity:    Worry: Not on file    Inability: Not on file  . Transportation needs:    Medical: Not on file    Non-medical: Not on file  Tobacco Use  . Smoking status: Never Smoker  . Smokeless tobacco: Never Used  Substance and Sexual Activity  . Alcohol use: Yes    Comment: occ  . Drug use: Never  . Sexual activity: Not on file  Lifestyle  . Physical activity:    Days per week: Not on file    Minutes per session: Not on file  . Stress: Not on file  Relationships  . Social connections:    Talks on phone: Not on file    Gets together: Not on file    Attends religious service: Not on file    Active member of club or organization: Not on file  Attends meetings of clubs or organizations: Not on file    Relationship status: Not on file  Other Topics Concern  . Not on file  Social History Narrative  . Not on file   Review of Systems - See HPI.  All other ROS are negative.  BP 122/80   Pulse 85   Temp 97.7 F (36.5 C) (Oral)   Resp 14   Ht 5\' 4"  (1.626 m)   Wt 127 lb (57.6 kg)   SpO2 100%   BMI 21.80 kg/m   Physical Exam  Constitutional: She is oriented to person, place, and time. She appears well-developed and well-nourished.  HENT:  Head: Normocephalic and atraumatic.  Right Ear: External ear normal.  Left Ear: External ear normal.  Nose: Nose normal.  Mouth/Throat: Oropharynx is clear and moist. No oropharyngeal exudate.  TM within normal limits bilaterally.  Eyes: Conjunctivae are normal.  Neck: Neck supple.  Cardiovascular: Normal rate, regular rhythm, normal heart sounds and intact distal pulses.  Pulmonary/Chest: Effort normal and breath sounds normal. No  stridor. No respiratory distress. She has no wheezes. She has no rales. She exhibits no tenderness.  Lymphadenopathy:    She has no cervical adenopathy.  Neurological: She is alert and oriented to person, place, and time.  Psychiatric: She has a normal mood and affect.  Vitals reviewed.  Recent Results (from the past 2160 hour(s))  POC Urinalysis Dipstick OB     Status: Abnormal   Collection Time: 01/12/18 11:20 AM  Result Value Ref Range   Color, UA yellow    Clarity, UA cloudy    Glucose, UA Negative Negative   Bilirubin, UA neg    Ketones, UA neg    Spec Grav, UA 1.020 1.010 - 1.025   Blood, UA 1+    pH, UA 6.0 5.0 - 8.0   POC,PROTEIN,UA Negative Negative, Trace   Urobilinogen, UA 0.2 0.2 or 1.0 E.U./dL   Nitrite, UA neg    Leukocytes, UA Small (1+) (A) Negative   Appearance     Odor    Urine Culture     Status: Abnormal   Collection Time: 01/12/18 11:24 AM  Result Value Ref Range   MICRO NUMBER: [id number]    SPECIMEN QUALITY: ADEQUATE    Sample Source CC    STATUS: FINAL    ISOLATE 1: Klebsiella pneumoniae (A)     Comment: Greater than 100,000 CFU/mL of Klebsiella pneumoniae      Susceptibility   Klebsiella pneumoniae - URINE CULTURE, REFLEX    AMOX/CLAVULANIC <=2 Sensitive     AMPICILLIN  Resistant     AMPICILLIN/SULBACTAM <=2 Sensitive     CEFAZOLIN* <=4 Not Reportable      * For infections other than uncomplicated UTIcaused by E. coli, K. pneumoniae or P. mirabilis:Cefazolin is resistant if MIC > or = 8 mcg/mL.(Distinguishing susceptible versus intermediatefor isolates with MIC < or = 4 mcg/mL requiresadditional testing.)For uncomplicated UTI caused by E. coli,K. pneumoniae or P. mirabilis: Cefazolin issusceptible if MIC <32 mcg/mL and predictssusceptible to the oral agents cefaclor, cefdinir,cefpodoxime, cefprozil, cefuroxime, cephalexinand loracarbef.    CEFEPIME <=1 Sensitive     CEFTRIAXONE <=1 Sensitive     CIPROFLOXACIN <=0.25 Sensitive     LEVOFLOXACIN <=0.12  Sensitive     ERTAPENEM <=0.5 Sensitive     GENTAMICIN <=1 Sensitive     IMIPENEM <=0.25 Sensitive     NITROFURANTOIN <=16 Sensitive     PIP/TAZO <=4 Sensitive     TOBRAMYCIN <=1 Sensitive  TRIMETH/SULFA* <=20 Sensitive      * For infections other than uncomplicated UTIcaused by E. coli, K. pneumoniae or P. mirabilis:Cefazolin is resistant if MIC > or = 8 mcg/mL.(Distinguishing susceptible versus intermediatefor isolates with MIC < or = 4 mcg/mL requiresadditional testing.)For uncomplicated UTI caused by E. coli,K. pneumoniae or P. mirabilis: Cefazolin issusceptible if MIC <32 mcg/mL and predictssusceptible to the oral agents cefaclor, cefdinir,cefpodoxime, cefprozil, cefuroxime, cephalexinand loracarbef.Legend:S = Susceptible  I = IntermediateR = Resistant  NS = Not susceptible* = Not tested  NR = Not reported**NN = See antimicrobic comments   Assessment/Plan: 1. Viral URI with cough Examination unremarkable. Symptoms are improving, per patient, over the past 2 days. Supportive measures and OTC medications reviewed. Reassurance given. No indication for ABX. Strict return precautions reviewed with patient.    Leeanne Rio, PA-C

## 2018-03-08 ENCOUNTER — Ambulatory Visit (INDEPENDENT_AMBULATORY_CARE_PROVIDER_SITE_OTHER): Payer: Medicare HMO

## 2018-03-08 DIAGNOSIS — J309 Allergic rhinitis, unspecified: Secondary | ICD-10-CM | POA: Diagnosis not present

## 2018-03-15 ENCOUNTER — Ambulatory Visit (INDEPENDENT_AMBULATORY_CARE_PROVIDER_SITE_OTHER): Payer: Medicare HMO

## 2018-03-15 DIAGNOSIS — J309 Allergic rhinitis, unspecified: Secondary | ICD-10-CM | POA: Diagnosis not present

## 2018-03-16 ENCOUNTER — Ambulatory Visit: Payer: Medicare HMO | Admitting: Neurology

## 2018-03-16 ENCOUNTER — Encounter: Payer: Self-pay | Admitting: Neurology

## 2018-03-16 VITALS — BP 132/78 | HR 96 | Ht 61.0 in | Wt 127.0 lb

## 2018-03-16 DIAGNOSIS — G20 Parkinson's disease: Secondary | ICD-10-CM | POA: Diagnosis not present

## 2018-03-16 DIAGNOSIS — G620 Drug-induced polyneuropathy: Secondary | ICD-10-CM | POA: Diagnosis not present

## 2018-03-16 MED ORDER — ESCITALOPRAM OXALATE 10 MG PO TABS: 10.0000 mg | ORAL_TABLET | Freq: Every day | ORAL | 3 refills | Status: DC

## 2018-03-16 NOTE — Progress Notes (Signed)
Reason for visit: Parkinson's disease  Referring physician: Dr. [name] is a 77 y.o. female  History of present illness:  Julia [name] is a 77 year old right-handed white female with a history of a primarily left-handed tremor that began about 2 and half years ago.  The patient was seen and evaluated in the summer 2019, she was diagnosed with Parkinson's disease in September 2019.  The patient was placed on Sinemet taking the 25/100 mg tablet, she was only able to tolerate 1/2 tablet daily because higher doses resulted in worsening depression.  The patient has had a lot of underlying anxiety issues in the past.  The patient reports that she has had some changes in handwriting, she has had micrographia and tremor in the handwriting.  Her speech has become softer and more whispery.  She has noted some mild changes in balance.  She appears to be more distractible, she has to focus more to get things done during the day.  She may talk in her sleep at night, she denies any vivid dreams.  The patient indicates that sometimes the tremor will jolt her out of sleep at night but she is able to get back to sleep fairly well.  She has begun to note in the last 4 months tremor involving the left leg.  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  The patient has a prior history of colon cancer.  The patient denies any true weakness of the extremities.  She recently moved from [place], [place] to this area to be with family.  Past Medical History:  Diagnosis Date  . Arthritis   . Colon cancer ([place])    previous colon cancer  . Hyperlipidemia     Past Surgical History:  Procedure Laterality Date  . ADENOIDECTOMY    . BREAST LUMPECTOMY    . BUNIONECTOMY    . COLON SURGERY    . HEMORRHOID SURGERY    . LAPAROSCOPY    . NASAL SINUS SURGERY    . OOPHORECTOMY    . TONSILLECTOMY    .  TUBAL LIGATION      Family History  Problem Relation Age of Onset  . Arthritis Mother   . Diabetes Mother   . Heart disease Mother   . Hypertension Mother   . Arthritis Father   . Cancer Father   . Depression Father   . Osteoporosis Father   . Arthritis Sister   . Cancer Sister   . Diabetes Sister   . Cancer Brother   . Cancer Sister   . Cancer Sister   . Hyperlipidemia Sister   . Hyperlipidemia Brother     Social history:  reports that she has never smoked. She has never used smokeless tobacco. She reports current alcohol use. She reports that she does not use drugs.  Medications:  Prior to Admission medications   Medication Sig Start Date End Date Taking? Authorizing Provider  Calcium Citrate-Vitamin D 315-250 MG-UNIT TABS Take by mouth.   Yes [provider]  carbidopa-levodopa (SINEMET IR) 25-100 MG tablet Take by mouth. 11/12/17 11/12/18 Yes [provider]  EPINEPHrine 0.3 mg/0.3 mL IJ SOAJ injection  02/01/18  Yes [provider]  fluticasone (FLONASE) 50 MCG/ACT nasal spray Place 2 sprays into both nostrils daily. 02/01/18  Yes Bobbitt, Sedalia Muta, MD  levothyroxine (SYNTHROID, LEVOTHROID) 88 MCG tablet Take 88 mcg by mouth daily before  breakfast.   Yes [provider]  Multiple Vitamins-Minerals (CENTRUM SILVER PO) Take by mouth.   Yes [provider]  Neomycin-Bacitracin Zn-Polymyx (TRIPLE ANTIBIOTIC OP) Place into the nose.    Yes [provider]  Probiotic Product (PROBIOTIC DAILY PO) Take by mouth.   Yes [provider]  tamoxifen (NOLVADEX) 20 MG tablet Take 20 mg by mouth daily.   Yes [provider]      Allergies  Allergen Reactions  . Cefdinir   . Cholestyramine     GERD  . Ezetimibe   . Meperidine   . Nitrofurantoin Diarrhea  . Statins   . Teriparatide     ROS:  Out of a complete 14 system review of symptoms, the patient complains only of the following symptoms, and all other  reviewed systems are negative.  Tremor Depression, anxiety  There were no vitals taken for this visit.  Physical Exam  General: The patient is alert and cooperative at the time of the examination.  Eyes: Pupils are equal, round, and reactive to light. Discs are flat bilaterally.  Neck: The neck is supple, no carotid bruits are noted.  Respiratory: The respiratory examination is clear.  Cardiovascular: The cardiovascular examination reveals a regular rate and rhythm, no obvious murmurs or rubs are noted.  Skin: Extremities are without significant edema.  Neurologic Exam  Mental status: The patient is alert and oriented x 3 at the time of the examination. The patient has apparent normal recent and remote memory, with an apparently normal attention span and concentration ability.  Cranial nerves: Facial symmetry is present. There is good sensation of the face to pinprick and soft touch bilaterally. The strength of the facial muscles and the muscles to head turning and shoulder shrug are normal bilaterally. Speech is well enunciated, no aphasia or dysarthria is noted. Extraocular movements are full. Visual fields are full. The tongue is midline, and the patient has symmetric elevation of the soft palate. No obvious hearing deficits are noted.  Masking of the face is seen.  Motor: The motor testing reveals 5 over 5 strength of all 4 extremities. Good symmetric motor tone is noted throughout.  Sensory: Sensory testing is intact to pinprick, soft touch, vibration sensation, and position sense on all 4 extremities. No evidence of extinction is noted.  The patient does appear to have a stocking pattern pinprick sensory deficit just above the ankles bilaterally.  Coordination: Cerebellar testing reveals good finger-nose-finger and heel-to-shin bilaterally.  A resting tremor is noted with the left leg at times as well as the left arm.  Gait and station: The patient is able to arise from a  seated position with arms crossed.  Once up, she is able to walk independently, decreased arm swing is seen bilaterally, left greater than right.  Some tremor in the left arm is noted with walking.  Posture is slightly stooped.  Tandem gait is slightly unsteady, Romberg is negative.  Reflexes: Deep tendon reflexes are symmetric and normal bilaterally. Toes are downgoing bilaterally.   Assessment/Plan:  1.  Parkinson's disease  2.  Anxiety and depression  The patient reports that Sinemet worsens her depression, we will need to add Lexapro at this time at a 10 mg dose.  The patient will contact me in about 3 weeks, we will try to go up on the Sinemet dose slowly over time.  The patient is instructed to remain active and have a regular exercise program.  She will follow-up in 4 months.  Jill Alexanders MD 03/16/2018 11:15 AM  [facility] [address] [place], [place] [address]  Phone [phone] Fax [phone]

## 2018-03-17 ENCOUNTER — Other Ambulatory Visit: Payer: Self-pay | Admitting: Family Medicine

## 2018-03-17 DIAGNOSIS — C50912 Malignant neoplasm of unspecified site of left female breast: Secondary | ICD-10-CM

## 2018-03-17 MED ORDER — TAMOXIFEN CITRATE 20 MG PO TABS: 20.0000 mg | ORAL_TABLET | Freq: Every day | ORAL | 0 refills | Status: DC

## 2018-03-17 NOTE — Telephone Encounter (Signed)
Requested medication (s) are due for refill today: yes  Requested medication (s) are on the active medication list: yes  Last refill:  Last refilled by oncologist  Future visit scheduled: yes  Notes to clinic:  Unable to refill per protocol. Pt states that this prescription was from her oncologist when she lived out of town. Pt is wondering if Dr Jonni Sanger would prescribe this until she can find her a new oncologist.     Requested Prescriptions  Pending Prescriptions Disp Refills   tamoxifen (NOLVADEX) 20 MG tablet      Sig: Take 1 tablet (20 mg total) by mouth daily.     Off-Protocol Failed - 03/17/2018 10:41 AM      Failed - Medication not assigned to a protocol, review manually.      Passed - Valid encounter within last 12 months    Recent Outpatient Visits          1 week ago Viral URI with cough   [facility] [place], [place], [place]   2 months ago Acute cystitis without hematuria   [facility] [place], [place]   2 months ago Parkinson's disease [facility])   [facility] [place], [place]      Future Appointments            In 3 weeks Leamon Arnt, MD [facility] [place], [place]   In 4 months [place], Sedalia Muta, MD [facility] [place]

## 2018-03-17 NOTE — Telephone Encounter (Signed)
Please notify pt: I have refilled a 30 day supply. She needs an oncologist. Place referral if needed. Thanks.

## 2018-03-17 NOTE — Telephone Encounter (Signed)
Pt is aware and a referral for Oncology has been placed. She is aware that she will contact her with information about Oncology appointment.

## 2018-03-17 NOTE — Telephone Encounter (Signed)
Copied from Susan Moore [phone]. Topic: Quick Communication - Rx Refill/Question >> Mar 17, 2018 10:27 AM Ahmed Prima L wrote: Medication: tamoxifen (NOLVADEX) 20 MG tablet  Has the patient contacted their pharmacy? No, because this is a script that she had from her oncologist when she lived out of town, she is wondering if Dr Jonni Sanger would prescribe this until she can find her a new oncologist.  (Agent: If no, request that the patient contact the pharmacy for the refill.) (Agent: If yes, when and what did the pharmacy advise?)  Preferred Pharmacy (with phone number or street name): [id number] - [place], [place] [address] [place] [address]    Agent: Please be advised that RX refills may take up to 3 business days. We ask that you follow-up with your pharmacy.

## 2018-03-17 NOTE — Telephone Encounter (Signed)
Please advise 

## 2018-03-18 ENCOUNTER — Telehealth: Payer: Self-pay | Admitting: Neurology

## 2018-03-18 NOTE — Telephone Encounter (Signed)
I called the patient.  The patient had some side effects on the Lexapro with increased tremors and nausea, she will cut the tablet in half taking 5 mg daily.

## 2018-03-18 NOTE — Telephone Encounter (Signed)
Pt took dose of escitalopram (LEXAPRO) 10 MG tablet last night at bedtime last night. Pt said she had double the tremors than before, nauseated, softBM, unable to sleep well. Pt is wanting to know if the medication can be cut in half. She is still experiencing the increase in tremors today. Please call to advise

## 2018-03-25 ENCOUNTER — Telehealth: Payer: Self-pay | Admitting: Hematology

## 2018-03-25 NOTE — Telephone Encounter (Signed)
NP appointment scheduled per [facility]. Letter calendar mailed to patient with date/time/location

## 2018-03-29 ENCOUNTER — Ambulatory Visit (INDEPENDENT_AMBULATORY_CARE_PROVIDER_SITE_OTHER): Payer: Medicare HMO

## 2018-03-29 DIAGNOSIS — J309 Allergic rhinitis, unspecified: Secondary | ICD-10-CM

## 2018-04-01 ENCOUNTER — Telehealth: Payer: Self-pay | Admitting: Neurology

## 2018-04-01 NOTE — Telephone Encounter (Signed)
Requested a call back to further discuss. MB RN

## 2018-04-01 NOTE — Telephone Encounter (Signed)
Requested a call back from the pt to further discuss.  

## 2018-04-01 NOTE — Telephone Encounter (Signed)
Pt is wanting to know how much to take of her carbidopa-levodopa (SINEMET IR) 25-100 MG tablet or if she should even continue to take it. Please advise.

## 2018-04-04 ENCOUNTER — Other Ambulatory Visit: Payer: Self-pay | Admitting: Family Medicine

## 2018-04-04 ENCOUNTER — Ambulatory Visit
Admission: RE | Admit: 2018-04-04 | Discharge: 2018-04-04 | Disposition: A | Discharge status: Home / Self Care | Payer: Medicare HMO | Source: Ambulatory Visit | Attending: Family Medicine | Admitting: Family Medicine

## 2018-04-04 DIAGNOSIS — C50912 Malignant neoplasm of unspecified site of left female breast: Secondary | ICD-10-CM

## 2018-04-04 DIAGNOSIS — M810 Age-related osteoporosis without current pathological fracture: Secondary | ICD-10-CM

## 2018-04-04 DIAGNOSIS — Z780 Asymptomatic menopausal state: Secondary | ICD-10-CM | POA: Diagnosis not present

## 2018-04-04 DIAGNOSIS — M85851 Other specified disorders of bone density and structure, right thigh: Secondary | ICD-10-CM | POA: Diagnosis not present

## 2018-04-04 DIAGNOSIS — Z1231 Encounter for screening mammogram for malignant neoplasm of breast: Secondary | ICD-10-CM | POA: Diagnosis not present

## 2018-04-04 IMAGING — MG DIGITAL SCREENING BILATERAL MAMMOGRAM WITH TOMO AND CAD
8 series · 9 of 24 positions shown · non-contrast
Comparison: Previous exam(s).

CLINICAL DATA: Screening.

EXAM:
DIGITAL SCREENING BILATERAL MAMMOGRAM WITH TOMO AND CAD

[R MLO synth-2D]
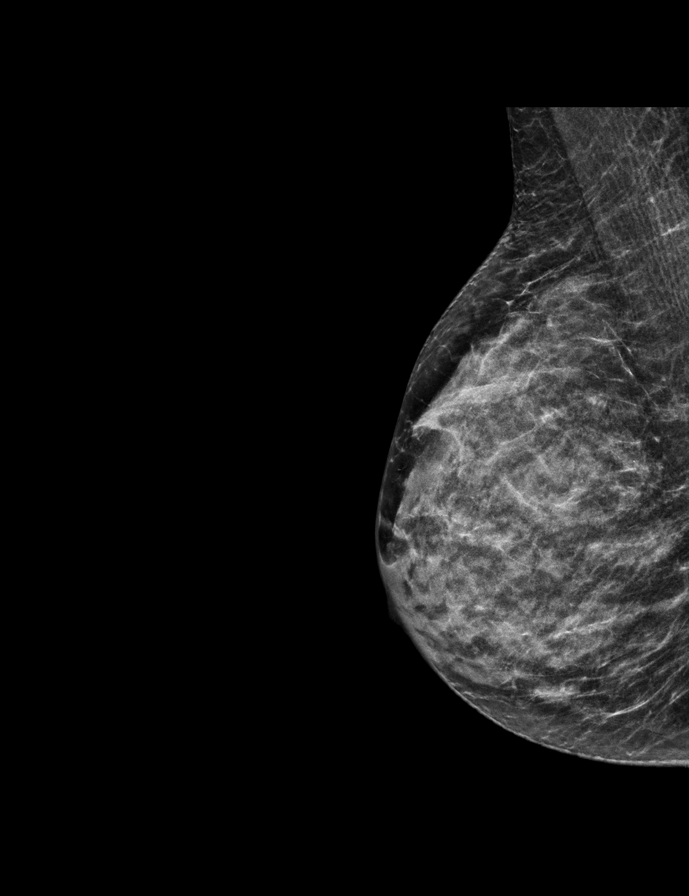

[R CC synth-2D]
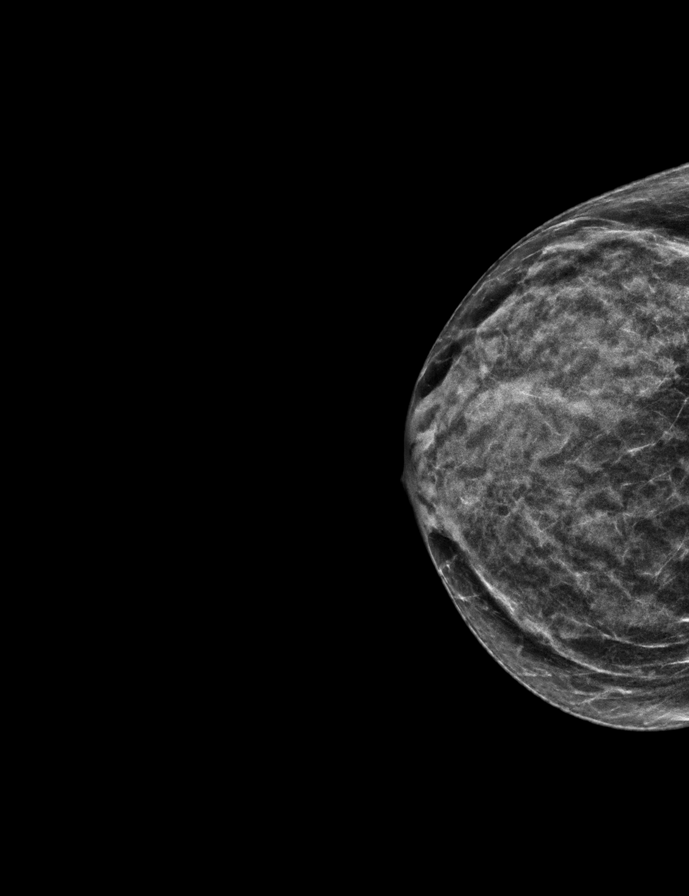

[L CC synth-2D]
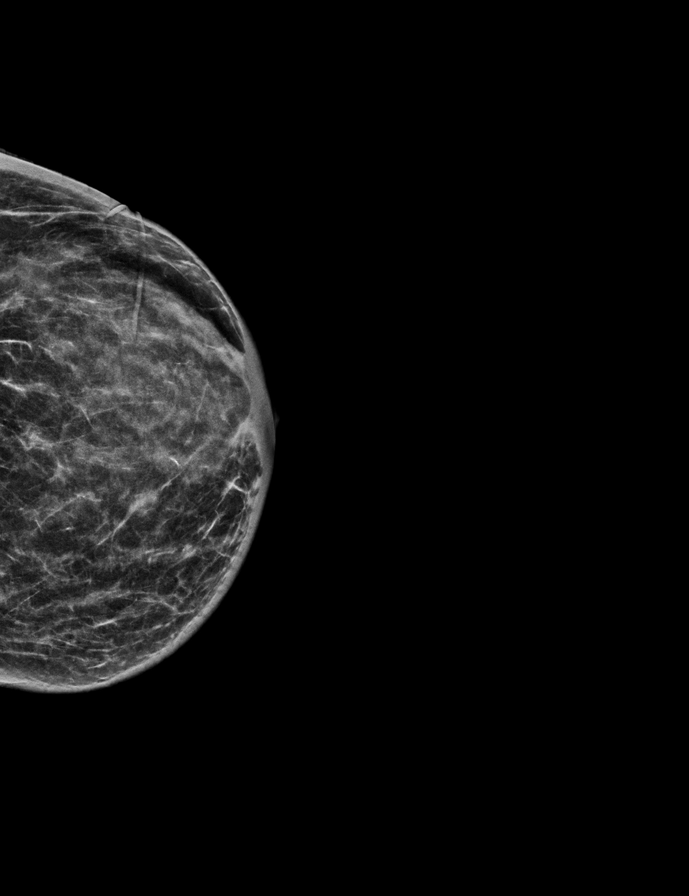

[L MLO synth-2D]
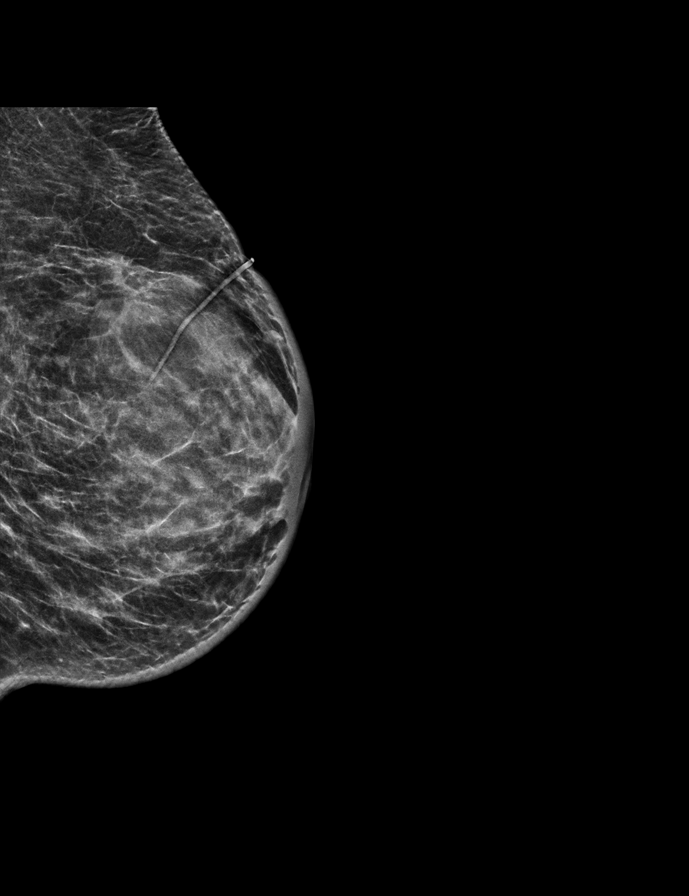

[L MLO tomo · 2 of 48 frames shown]
[frame 16/48]
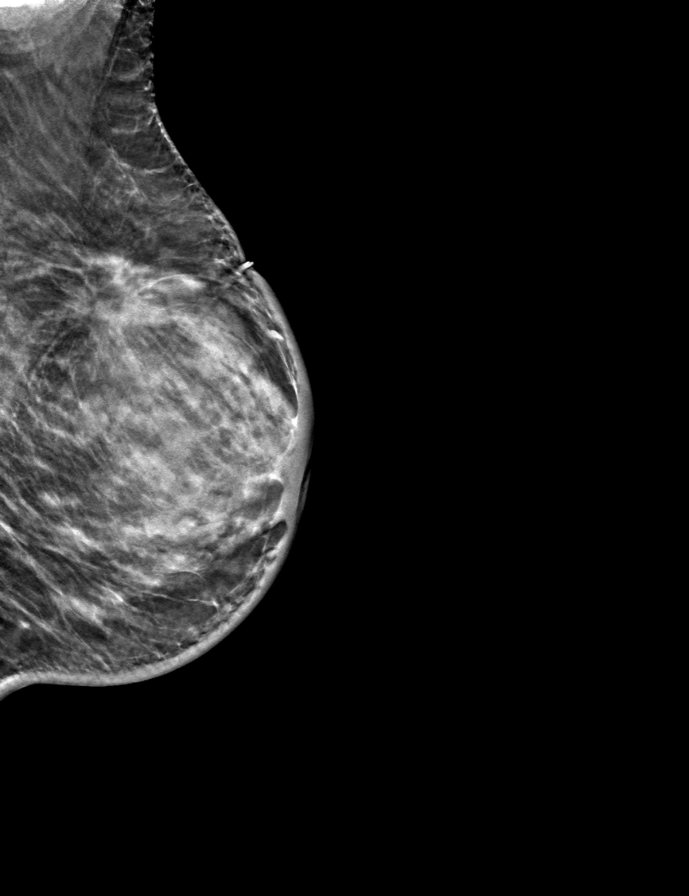
[frame 25/48]
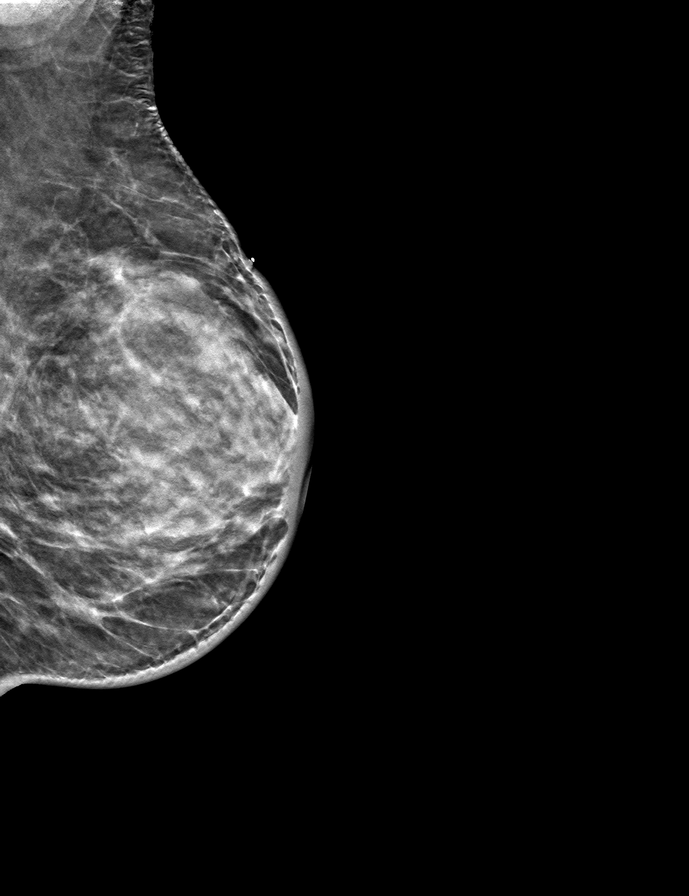

[L CC tomo · tomo slice 25/48.0]
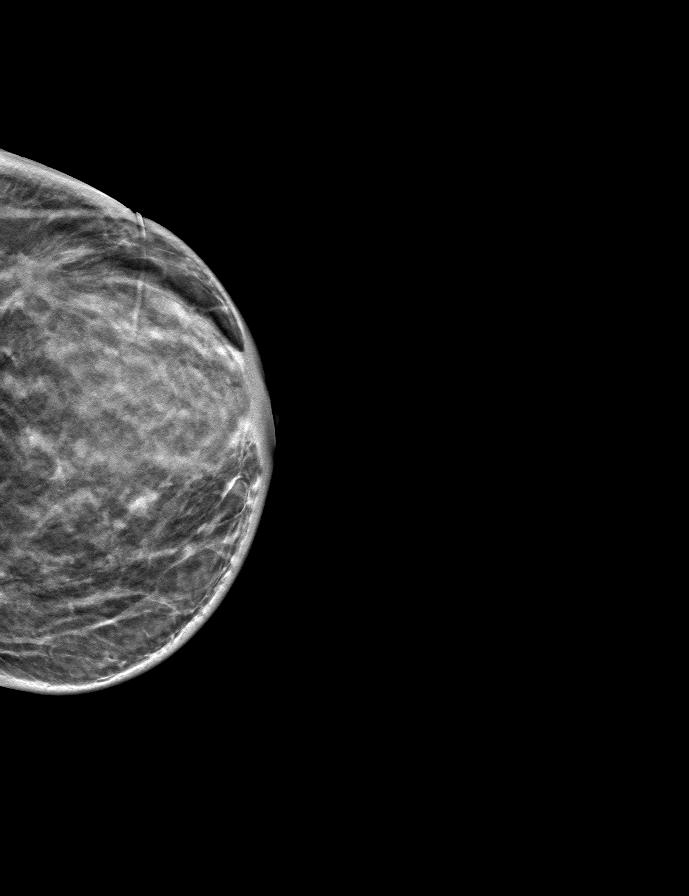

[R CC tomo · tomo slice 21/42.0]
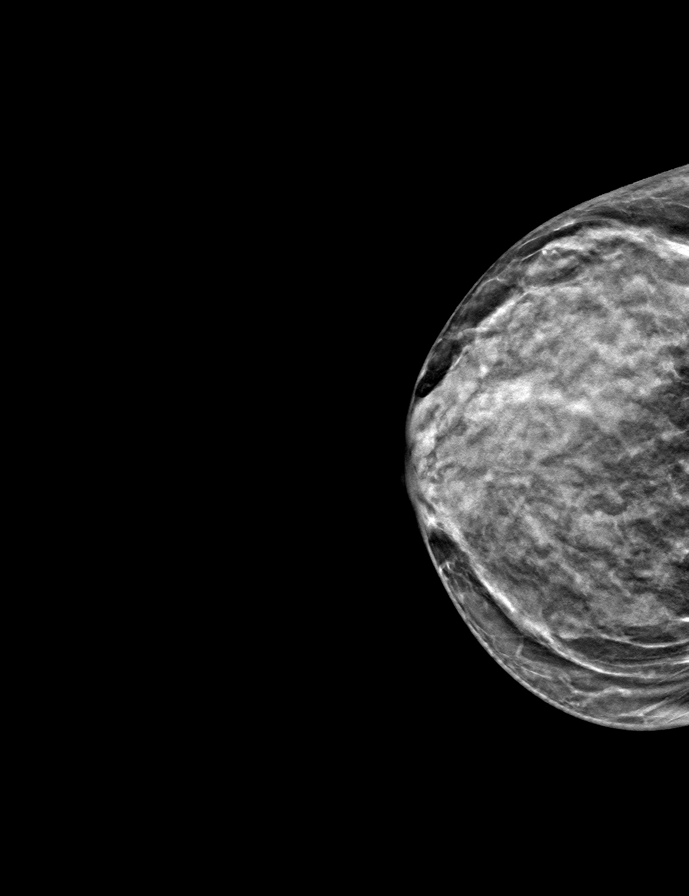

[R MLO tomo · tomo slice 22/43.0]
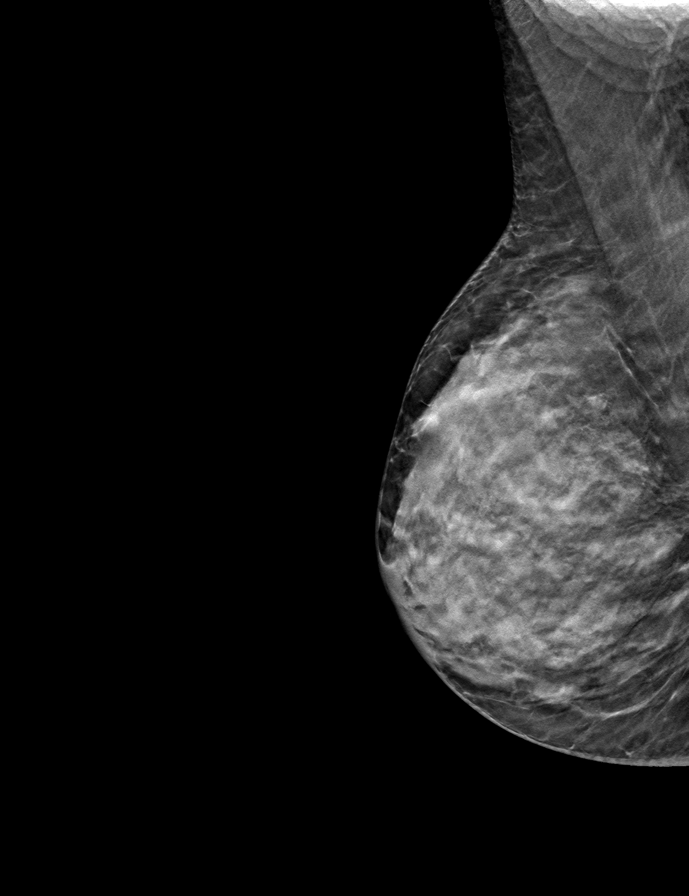

[9 of 24 positions shown; findings below may reference images not displayed]

ACR Breast Density Category d: The breast tissue is extremely dense,
which lowers the sensitivity of mammography
FINDINGS: There are no findings suspicious for malignancy. Images were
processed with CAD.
IMPRESSION: No mammographic evidence of malignancy. A result letter of this
screening mammogram will be mailed directly to the patient.

RECOMMENDATION:
Screening mammogram in one year. (Code:[5I])

BI-RADS CATEGORY  1: Negative.

## 2018-04-04 MED ORDER — CARBIDOPA-LEVODOPA 25-100 MG PO TABS: ORAL_TABLET | ORAL | 3 refills | Status: DC

## 2018-04-04 NOTE — Telephone Encounter (Signed)
Lvm requesting a call back from the pt to further discuss. MB RN

## 2018-04-04 NOTE — Telephone Encounter (Signed)
I called the patient.  The patient is currently taking Sinemet 25/100 mg tablets taking 1/2 tablet 3 times daily.  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  She has stopped the Lexapro, she will remain off of this.  We may have to add clonazepam or Ativan at night to suppress REM sleep.  She will try to go up on the Sinemet taking 1 full tablet in the morning and evening, and 1/2 tablet at midday.

## 2018-04-04 NOTE — Telephone Encounter (Signed)
Requested a call back from the pt to further discuss. MB RN

## 2018-04-04 NOTE — Telephone Encounter (Signed)
Pt returning RN's call.

## 2018-04-04 NOTE — Telephone Encounter (Signed)
Pt returning MD's call.

## 2018-04-04 NOTE — Addendum Note (Signed)
Addended by: Kathrynn Ducking on: 04/04/2018 04:51 PM   Modules accepted: Orders

## 2018-04-04 NOTE — Telephone Encounter (Signed)
I called the patient, left a message, I will call back later. 

## 2018-04-04 NOTE — Telephone Encounter (Addendum)
Pt returned my call. She wanted to confirm the dosage of the Sinemet. I advised right now Dr. Jannifer [place] is recommending 25-100 mg 1 tab daily.   After advising the pt of this, she states she has been having horrible nightmares at night and is being jilted awake out of her sleep. I ask if the pt felt like she was having muscle spasms, but she states it did not feel like a muscle spasm.   Pt also reported after taking the lexapro 10 mg, she started to have sharp pains in finger/toes and felt terrible.   Pt is not taking the lexapro at this time.   She wanted to verify if the MD believed the nightmares, being jilted awake, sharp pains in fingers/toes and feeling terrible could be related to either the Sinemet or Lexapro?  I advised I would fwd to MD for recommendation, pt was agreeable. Best call back # [phone].

## 2018-04-05 ENCOUNTER — Ambulatory Visit (INDEPENDENT_AMBULATORY_CARE_PROVIDER_SITE_OTHER): Payer: Medicare HMO

## 2018-04-05 DIAGNOSIS — J309 Allergic rhinitis, unspecified: Secondary | ICD-10-CM

## 2018-04-06 ENCOUNTER — Encounter: Payer: Self-pay | Admitting: *Deleted

## 2018-04-06 ENCOUNTER — Encounter: Payer: Self-pay | Admitting: Hematology

## 2018-04-07 ENCOUNTER — Inpatient Hospital Stay: Payer: Medicare HMO | Attending: Hematology | Admitting: Hematology

## 2018-04-07 ENCOUNTER — Inpatient Hospital Stay: Payer: Medicare HMO

## 2018-04-07 ENCOUNTER — Other Ambulatory Visit: Payer: Medicare HMO

## 2018-04-07 ENCOUNTER — Encounter: Payer: Self-pay | Admitting: Hematology

## 2018-04-07 ENCOUNTER — Encounter: Payer: Self-pay | Admitting: *Deleted

## 2018-04-07 ENCOUNTER — Other Ambulatory Visit: Payer: Self-pay | Admitting: Hematology

## 2018-04-07 VITALS — BP 129/75 | HR 92 | Temp 97.0°F | Resp 17 | Wt 128.0 lb

## 2018-04-07 DIAGNOSIS — Z170 Estrogen receptor positive status [ER+]: Secondary | ICD-10-CM

## 2018-04-07 DIAGNOSIS — Z85038 Personal history of other malignant neoplasm of large intestine: Secondary | ICD-10-CM

## 2018-04-07 DIAGNOSIS — Z79899 Other long term (current) drug therapy: Secondary | ICD-10-CM | POA: Diagnosis not present

## 2018-04-07 DIAGNOSIS — G20 Parkinson's disease: Secondary | ICD-10-CM | POA: Insufficient documentation

## 2018-04-07 DIAGNOSIS — C50912 Malignant neoplasm of unspecified site of left female breast: Secondary | ICD-10-CM | POA: Diagnosis not present

## 2018-04-07 DIAGNOSIS — N8110 Cystocele, unspecified: Secondary | ICD-10-CM | POA: Insufficient documentation

## 2018-04-07 DIAGNOSIS — C50919 Malignant neoplasm of unspecified site of unspecified female breast: Secondary | ICD-10-CM

## 2018-04-07 DIAGNOSIS — Z79810 Long term (current) use of selective estrogen receptor modulators (SERMs): Secondary | ICD-10-CM | POA: Diagnosis not present

## 2018-04-07 DIAGNOSIS — K623 Rectal prolapse: Secondary | ICD-10-CM | POA: Insufficient documentation

## 2018-04-07 DIAGNOSIS — Z923 Personal history of irradiation: Secondary | ICD-10-CM | POA: Insufficient documentation

## 2018-04-07 LAB — CMP (CANCER CENTER ONLY)
ALT: 4 U/L (ref 0–44)
AST: 14 U/L — ABNORMAL LOW (ref 15–41)
Albumin: 4.3 g/dL (ref 3.5–5.0)
Alkaline Phosphatase: 39 U/L (ref 38–126)
Anion gap: 5 (ref 5–15)
BUN: 22 mg/dL (ref 8–23)
CO2: 31 mmol/L (ref 22–32)
Calcium: 9.1 mg/dL (ref 8.9–10.3)
Chloride: 102 mmol/L (ref 98–111)
Creatinine: 1.05 mg/dL — ABNORMAL HIGH (ref 0.44–1.00)
GFR, Est AFR Am: >60 mL/min (ref >60)
GFR, Estimated: 52 mL/min — ABNORMAL LOW (ref >60)
Glucose, Bld: 123 mg/dL — ABNORMAL HIGH (ref 70–99)
Potassium: 4.1 mmol/L (ref 3.5–5.1)
Sodium: 138 mmol/L (ref 135–145)
Total Bilirubin: 0.3 mg/dL (ref 0.3–1.2)
Total Protein: 6.9 g/dL (ref 6.5–8.1)

## 2018-04-07 LAB — CBC WITH DIFFERENTIAL (CANCER CENTER ONLY)
Abs Immature Granulocytes: 0.02 10*3/uL (ref 0.00–0.07)
Basophils Absolute: 0 10*3/uL (ref 0.0–0.1)
Basophils Relative: 1 %
Eosinophils Absolute: 0.1 10*3/uL (ref 0.0–0.5)
Eosinophils Relative: 2 %
HCT: 42.8 % (ref 36.0–46.0)
Hemoglobin: 13.9 g/dL (ref 12.0–15.0)
Immature Granulocytes: 0 %
Lymphocytes Relative: 15 %
Lymphs Abs: 0.9 10*3/uL (ref 0.7–4.0)
MCH: 30.3 pg (ref 26.0–34.0)
MCHC: 32.5 g/dL (ref 30.0–36.0)
MCV: 93.4 fL (ref 80.0–100.0)
Monocytes Absolute: 0.5 10*3/uL (ref 0.1–1.0)
Monocytes Relative: 9 %
Neutro Abs: 4.3 10*3/uL (ref 1.7–7.7)
Neutrophils Relative %: 73 %
Platelet Count: 227 10*3/uL (ref 150–400)
RBC: 4.58 MIL/uL (ref 3.87–5.11)
RDW: 13.4 % (ref 11.5–15.5)
WBC Count: 5.8 10*3/uL (ref 4.0–10.5)
nRBC: 0 % (ref 0.0–0.2)

## 2018-04-07 MED ORDER — TAMOXIFEN CITRATE 20 MG PO TABS: 20.0000 mg | ORAL_TABLET | Freq: Every day | ORAL | 3 refills | Status: DC

## 2018-04-07 NOTE — Progress Notes (Signed)
..  Initial RN Navigator Patient Visit  Name: Julia [name] Date of Referral : 03/17/2018 Diagnosis: Breast, Colon & Ovarian  Met with patient prior to their visit with MD. Hanley Seamen patient "Your Patient Navigator" handout which explains my role, areas in which I am able to help, and all the contact information for myself and the office. Also gave patient MD and Navigator business card. Reviewed with patient the general overview of expected course after initial diagnosis and time frame for all steps to be completed.  Patient already has an established diagnosis and is s/p treatment. At this time she doesn't have any navigator needs at this time.   They have my number to reach out for any further clarification or additional needs.

## 2018-04-07 NOTE — Progress Notes (Signed)
[name] NOTE  Patient Care Team: Leamon Arnt, MD as PCP - General (Family Medicine)  HEME/ONC OVERVIEW: 1. 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 History of recurrent colon cancer -12/2000: Stage III adenocarcinoma of the cecum, s/p right hemicolectomy and 6 months of adjuvant 5-FU using NSABP protocol -08/2002: pelvic relapse of the colon cancer, treated with TAH/BSO, omentectomy, LN dissection and resection of peritoneal tumors followed by 29-monthof adjuvant FOLFOX6  3. 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, BRCA- -I reviewed the patient's external records in detail, including oncology clinic notes, imaging reports, and lab studies -In summary, patient was diagnosed with left breast invasive ductal carcinoma, ER positive, PR negative, HER2 negative, for which she underwent left lumpectomy with sentinel lymph node biopsy [facility] [place] she received adjuvant radiation, followed by hormonal blocker; she had several medication changes due to subjective toxicities related to the hormonal blocker, and is currently taking tamoxifen -Patient is tolerating tamoxifen well without significant side effects -Mammogram done on 04/04/2018, results pending -I briefly discussed with the patient that given the sentinel lymph node positivity, the duration of adjuvant hormonal  blocker may be extended beyond 5 years (i.e. past 2022); we will revisit the duration closer to that time -I have refilled the patient tamoxifen today -She will require yearly mammogram for cancer surveillance -No gynecologic surveillance indicated (increased risk of endometrial cancer secondary to tamoxifen), as patient had completed hysterectomy  History of recurrent colon cancer -Patient had stage III adenocarcinoma the cecum status post right hemicolectomy, followed by 6 months of adjuvant 5-FU; she had pelvic recurrence in 08/2002, for which she underwent TAH/BSO, omentectomy, lymph node dissection, and the resection of peritoneal tumors, followed by 6 months of adjuvant FOLFOX -As she is now 15 years from the cancer recurrence, risk of colon cancer recurrence is extremely low -Last colonoscopy in 2018, reportedly normal per patient -I encouraged the patient to stay up-to-date with her colon cancer screening, including colonoscopy  Parkinson's disease -Patient was diagnosed with Parkinson's disease after presenting with progressive gait imbalance, tremor in hands, and difficulty with speech -She is currently on carbidopa/levodopa  -I will defer the management to neurology  Rectal prolapse -Mostly secondary to extensive intra-abdominal surgeries -I discussed with the patient regarding referral to GI surgery, but the patient declined at this time  Orders Placed This Encounter  Procedures  . CBC with Differential (Cancer Center Only)    Standing Status:   Future    Standing Expiration Date:   05/12/2019  . CMP ([facility])    Standing Status:   Future    Standing Expiration Date:   05/12/2019    A total of more than 45 minutes were spent face-to-face with the patient during this encounter and over half of that time was spent on counseling and coordination of care as outlined above.    All questions were answered. The patient knows to call the clinic with any problems,  questions or concerns.  Return in 6 months for labs and clinic follow-up.  Tish Men, MD 04/07/2018 2:57 PM   CHIEF COMPLAINTS/PURPOSE OF CONSULTATION:  "I am doing well"  HISTORY OF PRESENTING ILLNESS:  Julia [name] 78 y.o. female is here because of history of breast cancer.  For detailed oncologic history, see outlined above.  Briefly, patient was diagnosed with left breast invasive ductal carcinoma, ER positive, PR negative, HER-2 negative, in 2016 in [place].  She underwent left lumpectomy with sentinel lymph node biopsy, followed by adjuvant radiation.  She was started on hormonal blocker, and most recently is on tamoxifen.  She reports tolerating tamoxifen relatively well with no significant side effects, such arthralgia.  She was diagnosed with Parkinson's disease approximately a year ago after presenting with gait imbalance, tremor in both hands, speech difficulty.  She is on carbidopa levodopa elbow, managed by neurology.  She also reports bowel incontinence due to rectal prolapse, likely secondary to multiple abdominal surgeries.  She denies any fever, chill, night sweats, lymphadenopathy, unexplained weight loss, chest pain, dyspnea, abdominal pain, nausea, vomiting, diarrhea, abnormal bleeding or bruising.   MEDICAL HISTORY:  Past Medical History:  Diagnosis Date  . Arthritis   . Colon cancer ([place])    previous colon cancer  . Hyperlipidemia     SURGICAL HISTORY: Past Surgical History:  Procedure Laterality Date  . ADENOIDECTOMY    . BREAST LUMPECTOMY Left    radiation only  . BUNIONECTOMY    . COLON SURGERY    . HEMORRHOID SURGERY    . LAPAROSCOPY    . NASAL SINUS SURGERY    . OOPHORECTOMY    . TONSILLECTOMY    . TUBAL LIGATION      SOCIAL HISTORY: Social History   Socioeconomic History  . Marital status: Married    Spouse name: Not on file  . Number of children: Not on file  . Years of education: 3  . Highest education level: Master's degree (e.g., MA, MS, MEng,  MEd, MSW, MBA)  Occupational History  . Occupation: Retired Management consultant  . Financial resource strain: Not on file  . Food insecurity:    Worry: Not on file    Inability: Not on file  . Transportation needs:    Medical: Not on file    Non-medical: Not on file  Tobacco Use  . Smoking status: Never Smoker  . Smokeless tobacco: Never Used  Substance and Sexual Activity  . Alcohol use: Yes    Comment: occ  . Drug use: Never  . Sexual activity: Not on file  Lifestyle  . Physical activity:    Days per week: Not on file    Minutes per session: Not on file  . Stress: Not on file  Relationships  . Social connections:    Talks on phone: Not on file    Gets together: Not on file    Attends religious service: Not on file    Active member of club or organization: Not on file    Attends meetings of clubs or organizations: Not on file    Relationship status: Not on file  . Intimate partner violence:    Fear of current or ex partner: Not on file    Emotionally abused: Not on file    Physically abused: Not on file    Forced sexual activity: Not on file  Other Topics Concern  . Not on file  Social History Narrative   Right handed  Lives with husband    Caffeine 1 cup daily     FAMILY HISTORY: Family History  Problem Relation Age of Onset  . Arthritis Mother   . Diabetes Mother   . Heart disease Mother   . Hypertension Mother   . Arthritis Father   . Cancer Father   . Depression Father   . Osteoporosis Father   . Arthritis Sister   . Cancer Sister   . Diabetes Sister   . Cancer Brother   . Cancer Sister   . Cancer Sister   . Hyperlipidemia Sister   . Hyperlipidemia Brother     ALLERGIES:  is allergic to cefdinir; cholestyramine; ezetimibe; meperidine; nitrofurantoin; statins; and teriparatide.  MEDICATIONS:  Current Outpatient Medications  Medication Sig Dispense Refill  . Calcium Citrate-Vitamin D 315-250 MG-UNIT TABS Take by mouth.    .  carbidopa-levodopa (SINEMET IR) 25-100 MG tablet 1 tablet in the morning and evening, 1/2 tablet at midday 75 tablet 3  . EPINEPHrine 0.3 mg/0.3 mL IJ SOAJ injection     . Fexofenadine HCl (ALLEGRA ALLERGY PO) Take by mouth.    . fluticasone (FLONASE) 50 MCG/ACT nasal spray Place 2 sprays into both nostrils daily. 16 g 5  . levothyroxine (SYNTHROID, LEVOTHROID) 88 MCG tablet Take 88 mcg by mouth daily before breakfast.    . Multiple Vitamins-Minerals (CENTRUM SILVER PO) Take by mouth.    . Neomycin-Bacitracin Zn-Polymyx (TRIPLE ANTIBIOTIC OP) Place into the nose.     . Probiotic Product (PROBIOTIC DAILY PO) Take by mouth.    . tamoxifen (NOLVADEX) 20 MG tablet Take 1 tablet (20 mg total) by mouth daily. 90 tablet 3   No current facility-administered medications for this visit.     REVIEW OF SYSTEMS:   Constitutional: ( - ) fevers, ( - )  chills , ( - ) night sweats Eyes: ( - ) blurriness of vision, ( - ) double vision, ( - ) watery eyes Ears, nose, mouth, throat, and face: ( - ) mucositis, ( - ) sore throat Respiratory: ( - ) cough, ( - ) dyspnea, ( - ) wheezes Cardiovascular: ( - ) palpitation, ( - ) chest discomfort, ( - ) lower extremity swelling Gastrointestinal:  ( - ) nausea, ( - ) heartburn, ( - ) change in bowel habits Skin: ( - ) abnormal skin rashes Lymphatics: ( - ) new lymphadenopathy, ( - ) easy bruising Neurological: ( - ) numbness, ( - ) tingling, ( + ) weaknesses Behavioral/Psych: ( - ) mood change, ( - ) new changes  All other systems were reviewed with the patient and are negative.  PHYSICAL EXAMINATION: ECOG PERFORMANCE STATUS: 1 - Symptomatic but completely ambulatory  Vitals:   04/07/18 1438  BP: 129/75  Pulse: 92  Resp: 17  Temp: (!) 97 F (36.1 C)  SpO2: 100%   Filed Weights   04/07/18 1438  Weight: 128 lb (58.1 kg)    GENERAL: alert, no distress and comfortable, well-appearing SKIN: skin color, texture, turgor are normal, no rashes or significant  lesions EYES: conjunctiva are pink and non-injected, sclera clear OROPHARYNX: no exudate, no erythema; lips, buccal mucosa, and tongue normal  NECK: supple, non-tender LYMPH:  no palpable lymphadenopathy in the cervical or axillary LUNGS: clear to auscultation and percussion with normal breathing effort HEART: regular rate & rhythm, no murmurs, no lower extremity edema ABDOMEN: soft, non-tender, non-distended, normal bowel sounds Musculoskeletal: no cyanosis of digits and no clubbing  PSYCH: alert & oriented x  3, slightly stumbled speech NEURO: Mild gait instability, bilateral hand tremor  LABORATORY DATA:  I have reviewed the data as listed Lab Results  Component Value Date   WBC 5.8 04/07/2018   HGB 13.9 04/07/2018   HCT 42.8 04/07/2018   MCV 93.4 04/07/2018   PLT 227 04/07/2018   Lab Results  Component Value Date   NA 134 (A) 09/23/2016   K 4.1 09/23/2016    RADIOGRAPHIC STUDIES: I have personally reviewed the radiological images as listed and agreed with the findings in the report. Dg Bone Density  Result Date: 04/04/2018 EXAM: DUAL X-RAY ABSORPTIOMETRY (DXA) FOR BONE MINERAL DENSITY IMPRESSION: Referring Physician:  [place] Your patient completed a BMD test using Lunar IDXA DXA system ( analysis version: 16 ) manufactured by [employer]. Technologist:AW PATIENT: Name: Julia [name], Julia [name] Patient ID: [id number] Birth Date: Jan 03, 1941 Height: 64.0 in. Sex: Female Measured: 04/04/2018 Weight: 128.4 lbs. Indications: Advanced Age, Bilateral Ovariectomy (65.51), Breast Cancer History, Caucasian, Estrogen Deficient, Family History of Osteoporosis, Hypothyroid, Hysterectomy, Levothyroxine, Postmenopausal, Tamoxifen Fractures: vertebrae Treatments: Calcium (E943.0), Hormone Therapy For Cancer, Vitamin D (E933.5) ASSESSMENT: The BMD measured at AP Spine L1-L2 is 0.702 g/cm2 with a T-score of -3.9. This patient is considered osteoporotic according to [facility])  criteria. The scan quality is good. L-3 and L-4 were excluded due to degenerative changes. Site Region Measured Date Measured Age YA BMD Significant CHANGE T-score AP Spine  L1-L2       6/04/[age]    75.5         -3.9    0.702 g/cm2 DualFemur Total Right 04/04/2018    75.5         -1.5    0.824 g/cm2 DualFemur Total Mean  6/04/2018    75.5         -1.4    0.836 g/cm2 World Health Organization [facility]) criteria for post-menopausal, Caucasian Women: Normal       T-score at or above -1 SD Osteopenia   T-score between -1 and -2.5 SD Osteoporosis T-score at or below -2.5 SD RECOMMENDATION: 1. All patients should optimize calcium and vitamin D intake. 2. Consider FDA approved medical therapies in postmenopausal women and men aged 56 years and older, based on the following: a. A hip or vertebral (clinical or morphometric) fracture b. T- score < or = -2.5 at the femoral neck or spine after appropriate evaluation to exclude secondary causes c. Low bone mass (T-score between -1.0 and -2.5 at the femoral neck or spine) and a 10 year probability of a hip fracture > or = 3% or a 10 year probability of a major osteoporosis-related fracture > or = 20% based on the [place]-adapted WHO algorithm d. Clinician judgment and/or patient preferences may indicate treatment for people with 10-year fracture probabilities above or below these levels FOLLOW-UP: People with diagnosed cases of osteoporosis or at high risk for fracture should have regular bone mineral density tests. For patients eligible for Medicare, routine testing is allowed once every 2 years. The testing frequency can be increased to one year for patients who have rapidly progressing disease, those who are receiving or discontinuing medical therapy to restore bone mass, or have additional risk factors. I have reviewed this report and agree with the above findings. [facility] Radiology Electronically Signed   By: Ilona Sorrel M.D.   On: 04/04/2018 13:58    PATHOLOGY: I have  reviewed the pathology reports as documented in the oncologist history.

## 2018-04-08 ENCOUNTER — Encounter: Payer: Self-pay | Admitting: *Deleted

## 2018-04-08 ENCOUNTER — Ambulatory Visit: Payer: Medicare HMO | Admitting: Hematology

## 2018-04-09 ENCOUNTER — Other Ambulatory Visit: Payer: Self-pay | Admitting: Family Medicine

## 2018-04-09 DIAGNOSIS — C50912 Malignant neoplasm of unspecified site of left female breast: Secondary | ICD-10-CM

## 2018-04-12 ENCOUNTER — Other Ambulatory Visit: Payer: Self-pay

## 2018-04-12 ENCOUNTER — Ambulatory Visit: Payer: Medicare HMO | Admitting: Family Medicine

## 2018-04-12 ENCOUNTER — Encounter: Payer: Self-pay | Admitting: Family Medicine

## 2018-04-12 ENCOUNTER — Telehealth: Payer: Self-pay | Admitting: *Deleted

## 2018-04-12 ENCOUNTER — Ambulatory Visit (INDEPENDENT_AMBULATORY_CARE_PROVIDER_SITE_OTHER): Payer: Medicare HMO

## 2018-04-12 VITALS — BP 112/68 | HR 95 | Temp 98.7°F | Resp 16 | Ht 62.0 in | Wt 130.0 lb

## 2018-04-12 DIAGNOSIS — J309 Allergic rhinitis, unspecified: Secondary | ICD-10-CM

## 2018-04-12 DIAGNOSIS — R159 Full incontinence of feces: Secondary | ICD-10-CM | POA: Diagnosis not present

## 2018-04-12 DIAGNOSIS — K623 Rectal prolapse: Secondary | ICD-10-CM | POA: Diagnosis not present

## 2018-04-12 DIAGNOSIS — M810 Age-related osteoporosis without current pathological fracture: Secondary | ICD-10-CM | POA: Diagnosis not present

## 2018-04-12 DIAGNOSIS — G20 Parkinson's disease: Secondary | ICD-10-CM | POA: Diagnosis not present

## 2018-04-12 DIAGNOSIS — G20A1 Parkinson's disease without dyskinesia, without mention of fluctuations: Secondary | ICD-10-CM

## 2018-04-12 DIAGNOSIS — Z85038 Personal history of other malignant neoplasm of large intestine: Secondary | ICD-10-CM

## 2018-04-12 DIAGNOSIS — E559 Vitamin D deficiency, unspecified: Secondary | ICD-10-CM | POA: Diagnosis not present

## 2018-04-12 LAB — VITAMIN D 25 HYDROXY (VIT D DEFICIENCY, FRACTURES): VITD: 72.81 ng/mL (ref 30.00–100.00)

## 2018-04-12 NOTE — Patient Instructions (Signed)
Please return in 6 months for your annual complete physical; please come fasting.   We will call you to get the Prolia injections set up for every 6 months. Today I am checking your Vitamin D levels.   We will call you with information regarding your referral appointment. Gastroenterology:  If you do not hear from [place] within the next 2 weeks, please let me know. It can take 1-2 weeks to get appointments set up with the specialists.   If you have any questions or concerns, please don't hesitate to send me a message via MyChart or call the office at [phone]. Thank you for visiting with [place] today! It's our pleasure caring for you.   Calcium Intake Recommendations You can take Caltrate Plus twice a day or get it through your diet or other OTC supplements (Viactiv, OsCal etc)  Calcium is a mineral that affects many functions in the body, including:  Blood clotting.  Blood vessel function.  Nerve impulse conduction.  Hormone secretion.  Muscle contraction.  Bone and teeth functions.  Most of your body's calcium supply is stored in your bones and teeth. When your calcium stores are low, you may be at risk for low bone mass, bone loss, and bone fractures. Consuming enough calcium helps to grow healthy bones and teeth and to prevent breakdown over time. It is very important that you get enough calcium if you are:  A child undergoing rapid growth.  An adolescent girl.  A pre- or post-menopausal woman.  A woman whose menstrual cycle has stopped due to anorexia nervosa or regular intense exercise.  An individual with lactose intolerance or a milk allergy.  A vegetarian.  What is my plan? Try to consume the recommended amount of calcium daily based on your age. Depending on your overall health, your health care provider may recommend increased calcium intake.General daily calcium intake recommendations by age are:  Birth to 6 months: 200 mg.  Infants 7 to 12 months: 260  mg.  Children 1 to 3 years: 700 mg.  Children 4 to 8 years: 1,000 mg.  Children 9 to 13 years: 1,300 mg.  Teens 14 to 18 years: 1,300 mg.  Adults 19 to 50 years: 1,000 mg.  Adult women 51 to 70 years: 1,200 mg.  Adult men 51 to 70 years: 1,000 mg.  Adults 71 years and older: 1,200 mg.  Pregnant and breastfeeding teens: 1,300 mg.  Pregnant and breastfeeding adults: 1,000 mg.  What do I need to know about calcium intake?  In order for the body to absorb calcium, it needs vitamin D. You can get vitamin D through (we recommend getting [id number] units of Vitamin D daily) ? Direct exposure of the skin to sunlight. ? Foods, such as egg yolks, liver, saltwater fish, and fortified milk. ? Supplements.  Consuming too much calcium may cause: ? Constipation. ? Decreased absorption of iron and zinc. ? Kidney stones.  Calcium supplements may interact with certain medicines. Check with your health care provider before starting any calcium supplements.  Try to get most of your calcium from food. What foods can I eat? Grains  Fortified oatmeal. Fortified ready-to-eat cereals. Fortified frozen waffles. Vegetables Turnip greens. Broccoli. Fruits Fortified orange juice. Meats and Other Protein Sources Canned sardines with bones. Canned salmon with bones. Soy beans. Tofu. Baked beans. Almonds. [place] nuts. Sunflower seeds. Dairy Milk. Yogurt. Cheese. Cottage cheese. Beverages Fortified soy milk. Fortified rice milk. Sweets/Desserts Pudding. Ice Cream. Milkshakes. Blackstrap molasses. The items listed above  may not be a complete list of recommended foods or beverages. Contact your dietitian for more options. What foods can affect my calcium intake? It may be more difficult for your body to use calcium or calcium may leave your body more quickly if you consume large amounts of:  Sodium.  Protein.  Caffeine.  Alcohol.  This information is not intended to replace advice given  to you by your health care provider. Make sure you discuss any questions you have with your health care provider. Document Released: 10/29/2003 Document Revised: 10/04/2015 Document Reviewed: 08/22/2013 Elsevier Interactive Patient Education  2018 [employer].

## 2018-04-12 NOTE — Telephone Encounter (Signed)
Request for Prolia has been submitted through the Prolia Portal.   Patient is aware that we will call her once we have an approval and medication in the office.

## 2018-04-12 NOTE — Progress Notes (Signed)
Subjective  CC:  Chief Complaint  Patient presents with  . Parkinson's disease  . Irregular Heart Beat    States that she had a few episodes yesterday  . Hip Pain    She states that the pain is in the left hip and comes and goes  . Osteoporosis    Wants to verify how much vitamin D to take    HPI: Julia [name] is a 78 y.o. female who presents to the office today to address the problems listed above in the chief complaint.  Follow-up osteoporosis: Recent bone density shows lowest T score of -3.8.  Has had compression fracture in the past.  Has been treated with Fosamax x8 years then Forteo x6 months.  Recommend starting Prolia and rechecking bone density in 1 year.  Consider envenity if not improving at that time.  Recommend calcium and vitamin D  Parkinson's disease: Has met with neurologist.  He has adjusted her medications.  She is doing a bit better.  High fall risk.  Complains of worsening fecal incontinence with history of rectal prolapse.  Has had colon cancer treatment and history of urogynecologic eval in the past with pelvic weakness from childbirth, status post episiotomy and postmenopausal.  She currently is on tamoxifen as well.  Has seen GI in the past but it has been decades.  Review of systems is positive for an occasional episode of palpitations which she described as an irregular heartbeat.  She was unable to feel her pulse.  She denies racing in the chest, shortness of breath or lower extremity edema.  No history of atrial fibrillation but she says it runs in her family.  She has no cardiac history.  Complains of left low back pain which is intermittent and nonradicular in nature.  Has been on and off for the last 6 months or so.  Does not take any medications for it.  She does have scoliosis and kyphosis. Assessment  1. Age-related osteoporosis without current pathological fracture   2. Parkinson's disease ([place])   3. Rectal prolapse   4. Incontinence of feces,  unspecified fecal incontinence type   5. History of colon cancer   6. Vitamin D deficiency disease      Plan   Osteoporosis: To start Prolia injections.  Calcium is normal by recent lab work.  Recheck vitamin D levels.  Recommend vitamin D, calcium and recheck bone density in 1 year.  Patient agrees with care plan.  She continues on tamoxifen.  Parkinson's disease: Improved clinically.  Continue with neurology.  Referred to gastroenterology for fecal incontinence evaluation and treatment  Low back pain: Musculoskeletal in nature.  Start Tylenol.  Reassured.  Palpitations: Intermittent.  Ongoing on and off for several years by her report.  In sinus rhythm now.  Monitor.  If become more frequent or persistent, rec Holter monitor and echocardiogram.  Patient agrees.  Follow up: Return in about 6 months (around 10/11/2018) for complete physical.  Visit date not found  Orders Placed This Encounter  Procedures  . VITAMIN D 25 Hydroxy (Vit-D Deficiency, Fractures)  . Ambulatory referral to Gastroenterology   No orders of the defined types were placed in this encounter.     I reviewed the patients updated PMH, FH, and SocHx.    Patient Active Problem List   Diagnosis Date Noted  . Age-related osteoporosis without current pathological fracture 07/20/2016    Priority: High  . Hyperlipidemia 03/07/2015    Priority: High  . Impaired fasting  glucose 03/07/2015    Priority: High  . History of colon cancer 02/13/2015    Priority: High  . Hypothyroidism due to Hashimoto's thyroiditis 02/13/2015    Priority: High  . Malignant neoplasm of female breast ([place]) 02/13/2015    Priority: High  . Anxiety 09/29/2017    Priority: Medium  . Parkinson's disease ([place]) 09/29/2017    Priority: Medium  . Dysfunctional voiding of urine 09/13/2016    Priority: Medium  . Female stress incontinence 09/13/2016    Priority: Medium  . 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  Gastroesophageal reflux disease 09/13/2016    Priority: Medium  . 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 Irritable bowel syndrome with both constipation and diarrhea 02/13/2015    Priority: Medium  . Atrophic vaginitis 02/13/2015    Priority: Low  . Recurrent UTI 02/13/2015    Priority: Low  . Vitamin D deficiency disease 02/13/2015    Priority: Low  . Rectal prolapse 04/07/2018  . 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 Perennial and seasonal allergic rhinitis 02/01/2018  . Allergic conjunctivitis 02/01/2018  . History of nasal polyp 02/01/2018  . History of food allergy 02/01/2018   Current Meds  Medication Sig  . Calcium Citrate-Vitamin D 315-250 MG-UNIT TABS Take by mouth.  . carbidopa-levodopa (SINEMET IR) 25-100 MG tablet 1 tablet in the morning and evening, 1/2 tablet at midday  . EPINEPHrine 0.3 mg/0.3 mL IJ SOAJ injection   . Fexofenadine HCl (ALLEGRA ALLERGY PO) Take by mouth.  . fluticasone (FLONASE) 50 MCG/ACT nasal spray Place 2 sprays into both nostrils daily.  Marland Kitchen levothyroxine (SYNTHROID, LEVOTHROID) 88 MCG tablet Take 88 mcg by mouth daily before breakfast.  . Multiple Vitamins-Minerals (CENTRUM SILVER PO) Take by mouth.  . Neomycin-Bacitracin Zn-Polymyx (TRIPLE ANTIBIOTIC OP) Place into the nose.   . tamoxifen (NOLVADEX) 20 MG tablet Take 1 tablet (20 mg total) by mouth daily.    Allergies: Patient is allergic to cefdinir; cholestyramine; ezetimibe; meperidine; nitrofurantoin; statins; and teriparatide. Family History: Patient family history includes Arthritis in her father, mother, and sister; Cancer in her brother, father, sister, sister, and sister; Depression in her father; Diabetes in her mother and sister; Heart disease in her mother; Hyperlipidemia in her brother and sister; Hypertension in her mother; Osteoporosis in her father. Social History:  Patient  reports that she has never smoked. She has  never used smokeless tobacco. She reports current alcohol use. She reports that she does not use drugs.  Review of Systems: Constitutional: Negative for fever malaise or anorexia Cardiovascular: negative for chest pain Respiratory: negative for SOB or persistent cough Gastrointestinal: negative for abdominal pain  Objective  Vitals: BP 112/68   Pulse 95   Temp 98.7 F (37.1 C) (Oral)   Resp 16   Ht 5' 2" (1.575 m)   Wt 130 lb (59 kg)   SpO2 98%   BMI 23.78 kg/m  General: no acute distress , A&Ox3 HEENT: PEERL, conjunctiva normal, Oropharynx moist,neck is supple Cardiovascular:  RRR without murmur or gallop.  Respiratory:  Good breath sounds bilaterally, CTAB with normal respiratory effort Skin:  Warm, no rashes Back: Kyphotic, scoliosis.  Left paravertebral muscle tender.  No SI joint tenderness, negative straight leg raise bilaterally.  No visits with results within 1 Day(s) from this visit.  Latest known visit with results is:  Appointment on 04/07/2018  Component Date Value Ref Range Status  . WBC  Count 04/07/2018 5.8  4.0 - 10.5 K/uL Final  . RBC 04/07/2018 4.58  3.87 - 5.11 MIL/uL Final  . Hemoglobin 04/07/2018 13.9  12.0 - 15.0 g/dL Final  . HCT 04/07/2018 42.8  36.0 - 46.0 % Final  . MCV 04/07/2018 93.4  80.0 - 100.0 fL Final  . MCH 04/07/2018 30.3  26.0 - 34.0 pg Final  . MCHC 04/07/2018 32.5  30.0 - 36.0 g/dL Final  . RDW 04/07/2018 13.4  11.5 - 15.5 % Final  . Platelet Count 04/07/2018 227  150 - 400 K/uL Final  . nRBC 04/07/2018 0.0  0.0 - 0.2 % Final  . Neutrophils Relative % 04/07/2018 73  % Final  . Neutro Abs 04/07/2018 4.3  1.7 - 7.7 K/uL Final  . Lymphocytes Relative 04/07/2018 15  % Final  . Lymphs Abs 04/07/2018 0.9  0.7 - 4.0 K/uL Final  . Monocytes Relative 04/07/2018 9  % Final  . Monocytes Absolute 04/07/2018 0.5  0.1 - 1.0 K/uL Final  . Eosinophils Relative 04/07/2018 2  % Final  . Eosinophils Absolute 04/07/2018 0.1  0.0 - 0.5 K/uL Final  .  Basophils Relative 04/07/2018 1  % Final  . Basophils Absolute 04/07/2018 0.0  0.0 - 0.1 K/uL Final  . Immature Granulocytes 04/07/2018 0  % Final  . Abs Immature Granulocytes 04/07/2018 0.02  0.00 - 0.07 K/uL Final  . Sodium 04/07/2018 138  135 - 145 mmol/L Final  . Potassium 04/07/2018 4.1  3.5 - 5.1 mmol/L Final  . Chloride 04/07/2018 102  98 - 111 mmol/L Final  . CO2 04/07/2018 31  22 - 32 mmol/L Final  . Glucose, Bld 04/07/2018 123* 70 - 99 mg/dL Final  . BUN 04/07/2018 22  8 - 23 mg/dL Final  . Creatinine 04/07/2018 1.05* 0.44 - 1.00 mg/dL Final  . Calcium 04/07/2018 9.1  8.9 - 10.3 mg/dL Final  . Total Protein 04/07/2018 6.9  6.5 - 8.1 g/dL Final  . Albumin 04/07/2018 4.3  3.5 - 5.0 g/dL Final  . AST 04/07/2018 14* 15 - 41 U/L Final  . ALT 04/07/2018 4  0 - 44 U/L Final  . Alkaline Phosphatase 04/07/2018 39  38 - 126 U/L Final  . Total Bilirubin 04/07/2018 0.3  0.3 - 1.2 mg/dL Final  . GFR, Est Non Af Am 04/07/2018 52* >60 mL/min Final  . GFR, Est AFR Am 04/07/2018 >60  >60 mL/min Final  . Anion gap 04/07/2018 5  5 - 15 Final     Commons side effects, risks, benefits, and alternatives for medications and treatment plan prescribed today were discussed, and the patient expressed understanding of the given instructions. Patient is instructed to call or message via MyChart if he/she has any questions or concerns regarding our treatment plan. No barriers to understanding were identified. We discussed Red Flag symptoms and signs in detail. Patient expressed understanding regarding what to do in case of urgent or emergency type symptoms.   Medication list was reconciled, printed and provided to the patient in AVS. Patient instructions and summary information was reviewed with the patient as documented in the AVS. This note was prepared with assistance of Dragon voice recognition software. Occasional wrong-word or sound-a-like substitutions may have occurred due to the inherent limitations  of voice recognition software

## 2018-04-14 ENCOUNTER — Telehealth: Payer: Self-pay

## 2018-04-14 NOTE — Telephone Encounter (Signed)
Received form from the pt via mail requesting Dr. Jannifer Franklin to sign off stating the pt could participate in a Parkinson's Cycle class at the [facility]. Dr. Jannifer Franklin agreed to sign form and I have faxed to # [phone] atten Cline Crock. Confirmation received.

## 2018-04-18 ENCOUNTER — Encounter: Payer: Self-pay | Admitting: *Deleted

## 2018-04-19 ENCOUNTER — Encounter: Payer: Self-pay | Admitting: Gastroenterology

## 2018-04-19 ENCOUNTER — Ambulatory Visit (INDEPENDENT_AMBULATORY_CARE_PROVIDER_SITE_OTHER): Payer: Medicare HMO

## 2018-04-19 DIAGNOSIS — J309 Allergic rhinitis, unspecified: Secondary | ICD-10-CM

## 2018-04-19 NOTE — Addendum Note (Signed)
Addended by: Golda Acre C on: 04/19/2018 12:33 PM   Modules accepted: Orders

## 2018-04-21 ENCOUNTER — Encounter: Payer: Self-pay | Admitting: *Deleted

## 2018-04-25 NOTE — Telephone Encounter (Signed)
This encounter was created in error - please disregard.

## 2018-04-26 ENCOUNTER — Ambulatory Visit (INDEPENDENT_AMBULATORY_CARE_PROVIDER_SITE_OTHER): Payer: Medicare HMO

## 2018-04-26 DIAGNOSIS — M170 Bilateral primary osteoarthritis of knee: Secondary | ICD-10-CM

## 2018-04-26 MED ORDER — DENOSUMAB 60 MG/ML ~~LOC~~ SOSY
60.0000 mg | PREFILLED_SYRINGE | Freq: Once | SUBCUTANEOUS | Status: AC
  Administered 2018-04-26: 60 mg via SUBCUTANEOUS

## 2018-04-26 NOTE — Progress Notes (Signed)
Administered Prolia injection Subcu right arm. Patient tolerated well.

## 2018-05-03 ENCOUNTER — Ambulatory Visit (INDEPENDENT_AMBULATORY_CARE_PROVIDER_SITE_OTHER): Payer: Medicare HMO

## 2018-05-03 DIAGNOSIS — J309 Allergic rhinitis, unspecified: Secondary | ICD-10-CM

## 2018-05-08 ENCOUNTER — Other Ambulatory Visit: Payer: Self-pay | Admitting: Family Medicine

## 2018-05-08 DIAGNOSIS — C50912 Malignant neoplasm of unspecified site of left female breast: Secondary | ICD-10-CM

## 2018-05-11 ENCOUNTER — Ambulatory Visit: Payer: Medicare HMO | Admitting: Gastroenterology

## 2018-05-13 ENCOUNTER — Encounter: Payer: Self-pay | Admitting: Family Medicine

## 2018-05-13 ENCOUNTER — Ambulatory Visit: Payer: Medicare HMO | Admitting: Family Medicine

## 2018-05-13 ENCOUNTER — Other Ambulatory Visit: Payer: Self-pay

## 2018-05-13 VITALS — BP 130/86 | HR 84 | Temp 98.6°F | Resp 14 | Ht 62.0 in | Wt 132.4 lb

## 2018-05-13 DIAGNOSIS — M810 Age-related osteoporosis without current pathological fracture: Secondary | ICD-10-CM | POA: Diagnosis not present

## 2018-05-13 DIAGNOSIS — R399 Unspecified symptoms and signs involving the genitourinary system: Secondary | ICD-10-CM | POA: Diagnosis not present

## 2018-05-13 DIAGNOSIS — N398 Other specified disorders of urinary system: Secondary | ICD-10-CM | POA: Diagnosis not present

## 2018-05-13 DIAGNOSIS — G20A1 Parkinson's disease without dyskinesia, without mention of fluctuations: Secondary | ICD-10-CM

## 2018-05-13 DIAGNOSIS — N3000 Acute cystitis without hematuria: Secondary | ICD-10-CM | POA: Diagnosis not present

## 2018-05-13 DIAGNOSIS — N952 Postmenopausal atrophic vaginitis: Secondary | ICD-10-CM | POA: Diagnosis not present

## 2018-05-13 DIAGNOSIS — G20 Parkinson's disease: Secondary | ICD-10-CM | POA: Diagnosis not present

## 2018-05-13 LAB — POCT URINALYSIS DIPSTICK
Bilirubin, UA: NEGATIVE
Blood, UA: POSITIVE
Glucose, UA: NEGATIVE
Ketones, UA: NEGATIVE
Nitrite, UA: NEGATIVE
Protein, UA: NEGATIVE
Spec Grav, UA: 1.02 (ref 1.010–1.025)
Urobilinogen, UA: 0.2 E.U./dL
pH, UA: 6 (ref 5.0–8.0)

## 2018-05-13 MED ORDER — SULFAMETHOXAZOLE-TRIMETHOPRIM 800-160 MG PO TABS: 1.0000 | ORAL_TABLET | Freq: Two times a day (BID) | ORAL | 0 refills | Status: DC

## 2018-05-13 NOTE — Progress Notes (Signed)
Subjective   CC:  Chief Complaint  Patient presents with  . Urinary Tract Infection    Symptoms started 2 days ago, has not taking anything, states today that symptoms are improved.    HPI: Julia [name] is a 78 y.o. female who presents to the office today to address the problems listed above in the chief complaint.  Patient reports mild dysuria and urinary frequency.  She has sensation of increased urinary pressure.  She denies fevers flank pain nausea vomiting or gross hematuria.  Symptoms have been present for several days. Last UTI was klebsiella in October.  She denies history of interstitial cystitis.  She denies vaginal symptoms including vaginal discharge or pelvic pain. She does have atrophic vaginitis and mild prolapse. She reports she gets UTIs frequently.  She has questions about leg cramps tx; cal and vit D supplement dosing and parkinson's. She is becoming more emotional.   Assessment  1. Acute cystitis without hematuria   2. UTI symptoms   3. 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 Dysfunctional voiding of urine   5. Parkinson's disease ([place])   6. Atrophic vaginitis      Plan   Treat for cystitis. Discussed preventive measures. Can't use vaginal premarin, add cranberry juice, proper self hygeine and will consider preventive daily abx if frequency increases .  Patient understands and agrees with care plan.   Discussed proper vit D and calcium supplements on prolia for osteoporosis. Recent levels were great. See avs.   Parkinson's counseling done. Monitor for cognitive changes. Continue activity and strengthening due to mild motor sxs.  Follow up: Return if symptoms worsen or fail to improve, for follow up Hypertension.  Orders Placed This Encounter  Procedures  . Urine Culture  . POCT urinalysis dipstick   Meds ordered this encounter  Medications  . sulfamethoxazole-trimethoprim (BACTRIM DS,SEPTRA DS) 800-160 MG tablet    Sig:  Take 1 tablet by mouth 2 (two) times daily.    Dispense:  14 tablet    Refill:  0      I reviewed the patients updated PMH, FH, and SocHx.    Patient Active Problem List   Diagnosis Date Noted  . Age-related osteoporosis without current pathological fracture 07/20/2016    Priority: High  . Hyperlipidemia 03/07/2015    Priority: High  . Impaired fasting glucose 03/07/2015    Priority: High  . History of colon cancer 02/13/2015    Priority: High  . Hypothyroidism due to Hashimoto's thyroiditis 02/13/2015    Priority: High  . Malignant neoplasm of female breast ([place]) 02/13/2015    Priority: High  . Anxiety 09/29/2017    Priority: Medium  . Parkinson's disease ([place]) 09/29/2017    Priority: Medium  . Dysfunctional voiding of urine 09/13/2016    Priority: Medium  . Female stress incontinence 09/13/2016    Priority: Medium  . 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 Gastroesophageal reflux disease 09/13/2016    Priority: Medium  . Drug-induced polyneuropathy ([place]) 03/07/2015    Priority: Medium  . Irritable bowel syndrome with both constipation and diarrhea 02/13/2015    Priority: Medium  . Atrophic vaginitis 02/13/2015    Priority: Low  . Recurrent UTI 02/13/2015    Priority: Low  . Vitamin D deficiency disease 02/13/2015    Priority: Low  . Rectal prolapse 04/07/2018  . 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 Perennial and seasonal allergic rhinitis 02/01/2018  .  Allergic conjunctivitis 02/01/2018  . History of nasal polyp 02/01/2018  . History of food allergy 02/01/2018   Current Meds  Medication Sig  . Calcium Citrate-Vitamin D 315-250 MG-UNIT TABS Take by mouth.  . carbidopa-levodopa (SINEMET IR) 25-100 MG tablet 1 tablet in the morning and evening, 1/2 tablet at midday  . EPINEPHrine 0.3 mg/0.3 mL IJ SOAJ injection   . Fexofenadine HCl (ALLEGRA ALLERGY PO) Take by mouth.  . fluticasone (FLONASE) 50 MCG/ACT nasal spray  Place 2 sprays into both nostrils daily.  Marland Kitchen levothyroxine (SYNTHROID, LEVOTHROID) 88 MCG tablet Take 88 mcg by mouth daily before breakfast.  . Multiple Vitamins-Minerals (CENTRUM SILVER PO) Take by mouth.  . Neomycin-Bacitracin Zn-Polymyx (TRIPLE ANTIBIOTIC OP) Place into the nose.   . Probiotic Product (PROBIOTIC DAILY PO) Take by mouth.  . tamoxifen (NOLVADEX) 20 MG tablet Take 1 tablet (20 mg total) by mouth daily.    Review of Systems: Cardiovascular: negative for chest pain Respiratory: negative for SOB or persistent cough Gastrointestinal: negative for abdominal pain Constitutional: Negative for fever malaise or anorexia  Objective  Vitals: BP 130/86   Pulse 84   Temp 98.6 F (37 C) (Oral)   Resp 14   Ht 5\' 2"  (1.575 m)   Wt 132 lb 6.4 oz (60.1 kg)   SpO2 98%   BMI 24.22 kg/m  General: no acute distress  Psych:  Alert and oriented, normal mood and affect Cardiovascular:  RRR without murmur or gallop. no peripheral edema Respiratory:  Good breath sounds bilaterally, CTAB with normal respiratory effort Gastrointestinal: soft, flat abdomen, normal active bowel sounds, no palpable masses, no hepatosplenomegaly, no appreciated hernias, NO CVAT, mild suprapubic ttp w/o rebound or guarding Skin:  Warm, no rashes Neurologic:   Mental status is normal. normal gait Office Visit on 05/13/2018  Component Date Value Ref Range Status  . Color, UA 05/13/2018 yellow   Final  . Clarity, UA 05/13/2018 clear   Final  . Glucose, UA 05/13/2018 Negative  Negative Final  . Bilirubin, UA 05/13/2018 negative   Final  . Ketones, UA 05/13/2018 negative   Final  . Spec Grav, UA 05/13/2018 1.020  1.010 - 1.025 Final  . Blood, UA 05/13/2018 positive   Final  . pH, UA 05/13/2018 6.0  5.0 - 8.0 Final  . Protein, UA 05/13/2018 Negative  Negative Final  . Urobilinogen, UA 05/13/2018 0.2  0.2 or 1.0 E.U./dL Final  . Nitrite, UA 05/13/2018 negative   Final  . Leukocytes, UA 05/13/2018 Trace* Negative  Final    Commons side effects, risks, benefits, and alternatives for medications and treatment plan prescribed today were discussed, and the patient expressed understanding of the given instructions. Patient is instructed to call or message via MyChart if he/she has any questions or concerns regarding our treatment plan. No barriers to understanding were identified. We discussed Red Flag symptoms and signs in detail. Patient expressed understanding regarding what to do in case of urgent or emergency type symptoms.   Medication list was reconciled, printed and provided to the patient in AVS. Patient instructions and summary information was reviewed with the patient as documented in the AVS. This note was prepared with assistance of Dragon voice recognition software. Occasional wrong-word or sound-a-like substitutions may have occurred due to the inherent limitations of voice recognition software

## 2018-05-13 NOTE — Patient Instructions (Signed)
Please follow up if symptoms do not improve or as needed.    Calcium Intake Recommendations You can take Caltrate Plus twice a day or get it through your diet or other OTC supplements (Viactiv, OsCal etc)  Calcium is a mineral that affects many functions in the body, including:  Blood clotting.  Blood vessel function.  Nerve impulse conduction.  Hormone secretion.  Muscle contraction.  Bone and teeth functions.  Most of your body's calcium supply is stored in your bones and teeth. When your calcium stores are low, you may be at risk for low bone mass, bone loss, and bone fractures. Consuming enough calcium helps to grow healthy bones and teeth and to prevent breakdown over time. It is very important that you get enough calcium if you are:  A child undergoing rapid growth.  An adolescent girl.  A pre- or post-menopausal woman.  A woman whose menstrual cycle has stopped due to anorexia nervosa or regular intense exercise.  An individual with lactose intolerance or a milk allergy.  A vegetarian.  What is my plan? Try to consume the recommended amount of calcium daily based on your age. Depending on your overall health, your health care provider may recommend increased calcium intake.General daily calcium intake recommendations by age are:  Birth to 6 months: 200 mg.  Infants 7 to 12 months: 260 mg.  Children 1 to 3 years: 700 mg.  Children 4 to 8 years: 1,000 mg.  Children 9 to 13 years: 1,300 mg.  Teens 14 to 18 years: 1,300 mg.  Adults 19 to 50 years: 1,000 mg.  Adult women 51 to 70 years: 1,200 mg.  Adult men 51 to 70 years: 1,000 mg.  Adults 71 years and older: 1,200 mg.  Pregnant and breastfeeding teens: 1,300 mg.  Pregnant and breastfeeding adults: 1,000 mg.  What do I need to know about calcium intake?  In order for the body to absorb calcium, it needs vitamin D. You can get vitamin D through (we recommend getting [phone] units of Vitamin D  daily) ? Direct exposure of the skin to sunlight. ? Foods, such as egg yolks, liver, saltwater fish, and fortified milk. ? Supplements.  Consuming too much calcium may cause: ? Constipation. ? Decreased absorption of iron and zinc. ? Kidney stones.  Calcium supplements may interact with certain medicines. Check with your health care provider before starting any calcium supplements.  Try to get most of your calcium from food. What foods can I eat? Grains  Fortified oatmeal. Fortified ready-to-eat cereals. Fortified frozen waffles. Vegetables Turnip greens. Broccoli. Fruits Fortified orange juice. Meats and Other Protein Sources Canned sardines with bones. Canned salmon with bones. Soy beans. Tofu. Baked beans. Almonds. [place] nuts. Sunflower seeds. Dairy Milk. Yogurt. Cheese. Cottage cheese. Beverages Fortified soy milk. Fortified rice milk. Sweets/Desserts Pudding. Ice Cream. Milkshakes. Blackstrap molasses. The items listed above may not be a complete list of recommended foods or beverages. Contact your dietitian for more options. What foods can affect my calcium intake? It may be more difficult for your body to use calcium or calcium may leave your body more quickly if you consume large amounts of:  Sodium.  Protein.  Caffeine.  Alcohol.  This information is not intended to replace advice given to you by your health care provider. Make sure you discuss any questions you have with your health care provider. Document Released: 10/29/2003 Document Revised: 10/04/2015 Document Reviewed: 08/22/2013 Elsevier Interactive Patient Education  2018 [employer].

## 2018-05-16 ENCOUNTER — Ambulatory Visit: Payer: Medicare HMO | Admitting: Family Medicine

## 2018-05-16 DIAGNOSIS — J3081 Allergic rhinitis due to animal (cat) (dog) hair and dander: Secondary | ICD-10-CM | POA: Diagnosis not present

## 2018-05-17 DIAGNOSIS — J3089 Other allergic rhinitis: Secondary | ICD-10-CM | POA: Diagnosis not present

## 2018-05-17 LAB — URINE CULTURE
MICRO NUMBER:: 197564
SPECIMEN QUALITY:: ADEQUATE

## 2018-05-18 NOTE — Progress Notes (Addendum)
Reprint one label. VIALS EXP 05-19-2019

## 2018-05-24 ENCOUNTER — Ambulatory Visit (INDEPENDENT_AMBULATORY_CARE_PROVIDER_SITE_OTHER): Payer: Medicare HMO

## 2018-05-24 DIAGNOSIS — J309 Allergic rhinitis, unspecified: Secondary | ICD-10-CM

## 2018-05-24 NOTE — Progress Notes (Signed)
Immunotherapy   Patient Details  Name: Rhylei Mcquaig MRN: [id number] Date of Birth: 22-Jan-1941  05/24/2018  Sula Fetterly is starting vials from our office (DM-DOG and MOLD @ .10cc) Following schedule: C (per Dr. Verlin Fester) Frequency:1 time per week Epi-Pen:Epi-Pen Available  Consent signed and patient instructions given. No problems after 30 minute wait.   Mariajose Mow 05/24/2018, 12:00 PM

## 2018-05-31 ENCOUNTER — Ambulatory Visit (INDEPENDENT_AMBULATORY_CARE_PROVIDER_SITE_OTHER): Payer: Medicare HMO

## 2018-05-31 DIAGNOSIS — J309 Allergic rhinitis, unspecified: Secondary | ICD-10-CM | POA: Diagnosis not present

## 2018-06-07 ENCOUNTER — Ambulatory Visit (INDEPENDENT_AMBULATORY_CARE_PROVIDER_SITE_OTHER): Payer: Medicare HMO

## 2018-06-07 DIAGNOSIS — J309 Allergic rhinitis, unspecified: Secondary | ICD-10-CM

## 2018-06-14 ENCOUNTER — Ambulatory Visit: Payer: Medicare HMO | Admitting: Gastroenterology

## 2018-06-15 ENCOUNTER — Other Ambulatory Visit: Payer: Self-pay

## 2018-06-15 ENCOUNTER — Encounter: Payer: Self-pay | Admitting: Physician Assistant

## 2018-06-15 ENCOUNTER — Ambulatory Visit: Payer: Self-pay | Admitting: *Deleted

## 2018-06-15 ENCOUNTER — Ambulatory Visit: Payer: Medicare HMO | Admitting: Physician Assistant

## 2018-06-15 VITALS — BP 110/80 | HR 89 | Temp 98.0°F | Resp 14 | Ht 62.0 in | Wt 132.0 lb

## 2018-06-15 DIAGNOSIS — L255 Unspecified contact dermatitis due to plants, except food: Secondary | ICD-10-CM | POA: Diagnosis not present

## 2018-06-15 DIAGNOSIS — K130 Diseases of lips: Secondary | ICD-10-CM | POA: Diagnosis not present

## 2018-06-15 MED ORDER — PREDNISONE 10 MG PO TABS: ORAL_TABLET | ORAL | 0 refills | Status: AC

## 2018-06-15 MED ORDER — TRIAMCINOLONE ACETONIDE 0.5 % EX OINT: 1.0000 "application " | TOPICAL_OINTMENT | Freq: Two times a day (BID) | CUTANEOUS | 0 refills | Status: DC

## 2018-06-15 NOTE — Telephone Encounter (Signed)
Message from Rayann Heman sent at 06/15/2018 9:51 AM EDT   Summary: poison oak    Pt called and stated that she has poison oak all over her body and would like to know if something could be called in. Please advise          Returned call back to patient regarding her exposure to poison oak. Rash and bumps are on her right foot and ankle, up her leg, both arms and face (on her cheek). It is not near her eyes or mouth. With some oozing places on her arms. She is using cortisone 10 and calamine lotion for the itching. She has taken Benadryl and allegra.  Appointment scheduled per protocol. No respiratory symptoms. Home care advice given with verbal understanding: keep hands away from the face. Avoid itching, use cool compresses for the itching, along with the Benadryl. Attempted to do an e-visit so she would not have to come into the office. But will take the appointment and continue the home care advice and call back if she feels like this is helping. Routing to flow at [place]    Reason for Disposition . [1] Face, eyes, lips or genitals are involved AND [2] more than a small rash  Answer Assessment - Initial Assessment Questions 1. APPEARANCE of RASH: "Describe the rash."      Red to dark red and hard underneath the skin, red and blistering 2. LOCATION: "Where is the rash located?"      Right foot, both arms and face 3. SIZE: "How large is the rash?"      Small areas that are spreading, except the one on the ankle is from the front of the foot to the back 4. ONSET: "When did the rash begin?"      About a week ago 5. ITCHING: "Does the rash itch?" If so, ask: "How bad is it?"   - MILD - doesn't interfere with normal activities   - MODERATE - SEVERE: interferes with work, school, sleep, or other activities      Itches and moderate to severe 6. PREGNANCY: "Is there any chance you are pregnant?" "When was your last menstrual period?"     no  Protocols used:  POISON IVY - OAK - SUMAC-A-AH

## 2018-06-15 NOTE — Patient Instructions (Signed)
Please keep skin clean and dry. Take the steroid taper as directed.  Apply the Kenalog to the more troublesome areas -- do not apply on face or groin. Get some Wtich hazel astringent to apply to the leg as it will help dry the lesions up.  Call me if not improving!  You will be contacted by Dermatology for assessment of the nails and cyst of lip. Start a daily over-the-counter Biotin Supplement.   Contact Dermatitis Dermatitis is redness, soreness, and swelling (inflammation) of the skin. Contact dermatitis is a reaction to something that touches the skin. There are two types of contact dermatitis:  Irritant contact dermatitis. This happens when something bothers (irritates) your skin, like soap.  Allergic contact dermatitis. This is caused when you are exposed to something that you are allergic to, such as poison ivy. What are the causes?  Common causes of irritant contact dermatitis include: ? Makeup. ? Soaps. ? Detergents. ? Bleaches. ? Acids. ? Metals, such as nickel.  Common causes of allergic contact dermatitis include: ? Plants. ? Chemicals. ? Jewelry. ? Latex. ? Medicines. ? Preservatives in products, such as clothing. What increases the risk?  Having a job that exposes you to things that bother your skin.  Having asthma or eczema. What are the signs or symptoms? Symptoms may happen anywhere the irritant has touched your skin. Symptoms include:  Dry or flaky skin.  Redness.  Cracks.  Itching.  Pain or a burning feeling.  Blisters.  Blood or clear fluid draining from skin cracks. With allergic contact dermatitis, swelling may occur. This may happen in places such as the eyelids, mouth, or genitals. How is this treated?  This condition is treated by checking for the cause of the reaction and protecting your skin. Treatment may also include: ? Steroid creams, ointments, or medicines. ? Antibiotic medicines or other ointments, if you have a skin  infection. ? Lotion or medicines to help with itching. ? A bandage (dressing). Follow these instructions at home: Skin care  Moisturize your skin as needed.  Put cool cloths on your skin.  Put a baking soda paste on your skin. Stir water into baking soda until it looks like a paste.  Do not scratch your skin.  Avoid having things rub up against your skin.  Avoid the use of soaps, perfumes, and dyes. Medicines  Take or apply over-the-counter and prescription medicines only as told by your doctor.  If you were prescribed an antibiotic medicine, take or apply it as told by your doctor. Do not stop using it even if your condition starts to get better. Bathing  Take a bath with: ? Epsom salts. ? Baking soda. ? Colloidal oatmeal.  Bathe less often.  Bathe in warm water. Avoid using hot water. Bandage care  If you were given a bandage, change it as told by your health care provider.  Wash your hands with soap and water before and after you change your bandage. If soap and water are not available, use hand sanitizer. General instructions  Avoid the things that caused your reaction. If you do not know what caused it, keep a journal. Write down: ? What you eat. ? What skin products you use. ? What you drink. ? What you wear in the area that has symptoms. This includes jewelry.  Check the affected areas every day for signs of infection. Check for: ? More redness, swelling, or pain. ? More fluid or blood. ? Warmth. ? Pus or a bad smell.  Keep all follow-up visits as told by your doctor. This is important. Contact a doctor if:  You do not get better with treatment.  Your condition gets worse.  You have signs of infection, such as: ? More swelling. ? Tenderness. ? More redness. ? Soreness. ? Warmth.  You have a fever.  You have new symptoms. Get help right away if:  You have a very bad headache.  You have neck pain.  Your neck is stiff.  You throw up  (vomit).  You feel very sleepy.  You see red streaks coming from the area.  Your bone or joint near the area hurts after the skin has healed.  The area turns darker.  You have trouble breathing. Summary  Dermatitis is redness, soreness, and swelling of the skin.  Symptoms may occur where the irritant has touched you.  Treatment may include medicines and skin care.  If you do not know what caused your reaction, keep a journal.  Contact a doctor if your condition gets worse or you have signs of infection. This information is not intended to replace advice given to you by your health care provider. Make sure you discuss any questions you have with your health care provider. Document Released: 01/11/2009 Document Revised: 09/29/2017 Document Reviewed: 09/29/2017 Elsevier Interactive Patient Education  2019 [employer].

## 2018-06-15 NOTE — Progress Notes (Signed)
Patient presents to clinic today c/o exposure to poison oak while doing yard work 1 week ago with pruritic, blistering rash starting shortly after. Notes rash began on ankles bilaterally with R>L. Has spread to thighs, arms bilaterally and now face. Denies pain of rash, only itch. Some mild oozing of R leg but this is better today per patient. Denies any SOB or racing heart. Has not done anything for symptoms..   Past Medical History:  Diagnosis Date  . Arthritis   . Breast cancer ([place])   . Colon cancer ([place])    previous colon cancer  . Hyperlipidemia   . 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 Calcium Citrate-Vitamin D 315-250 MG-UNIT TABS Take by mouth.    . carbidopa-levodopa (SINEMET IR) 25-100 MG tablet 1 tablet in the morning and evening, 1/2 tablet at midday 75 tablet 3  . EPINEPHrine 0.3 mg/0.3 mL IJ SOAJ injection     . Fexofenadine HCl (ALLEGRA ALLERGY PO) Take by mouth.    . fluticasone (FLONASE) 50 MCG/ACT nasal spray Place 2 sprays into both nostrils daily. 16 g 5  . levothyroxine (SYNTHROID, LEVOTHROID) 88 MCG tablet Take 88 mcg by mouth daily before breakfast.    . Multiple Vitamins-Minerals (CENTRUM SILVER PO) Take by mouth.    . Neomycin-Bacitracin Zn-Polymyx (TRIPLE ANTIBIOTIC OP) Place into the nose.     . Probiotic Product (PROBIOTIC DAILY PO) Take by mouth.    . tamoxifen (NOLVADEX) 20 MG tablet Take 1 tablet (20 mg total) by mouth daily. 90 tablet 3   No current facility-administered medications on file prior to visit.     Allergies  Allergen Reactions  . Cefdinir   . Cholestyramine     GERD  . Ezetimibe   . Meperidine   . Nitrofurantoin Diarrhea  . Statins   . Teriparatide     Family History  Problem Relation Age of Onset  . Arthritis Mother   . Diabetes Mother   . Heart disease Mother   . Hypertension Mother   . Arthritis Father   .  Cancer Father   . Depression Father   . Osteoporosis Father   . Arthritis Sister   . Cancer Sister   . Diabetes Sister   . Cancer Brother   . Cancer Sister   . Cancer Sister   . Hyperlipidemia Sister   . Hyperlipidemia Brother     Social History   Socioeconomic History  . Marital status: Married    Spouse name: Not on file  . Number of children: Not on file  . Years of education: 3  . Highest education level: Master's degree (e.g., MA, MS, MEng, MEd, MSW, MBA)  Occupational History  . Occupation: Retired Management consultant  . Financial resource strain: Not on file  . Food insecurity:    Worry: Not on file    Inability: Not on file  . Transportation needs:    Medical: Not on file    Non-medical: Not on file  Tobacco Use  . Smoking status: Never Smoker  . Smokeless tobacco: Never Used  Substance and Sexual Activity  . Alcohol use: Yes    Comment: occ  . Drug use: Never  . Sexual activity: Not on file  Lifestyle  . Physical activity:    Days per week: Not on file    Minutes per session: Not on file  .  Stress: Not on file  Relationships  . Social connections:    Talks on phone: Not on file    Gets together: Not on file    Attends religious service: Not on file    Active member of club or organization: Not on file    Attends meetings of clubs or organizations: Not on file    Relationship status: Not on file  Other Topics Concern  . Not on file  Social History Narrative   Right handed    Lives with husband    Caffeine 1 cup daily     Review of Systems - See HPI.  All other ROS are negative.  BP 110/80   Pulse 89   Temp 98 F (36.7 C) (Oral)   Resp 14   Ht 5\' 2"  (1.575 m)   Wt 132 lb (59.9 kg)   SpO2 97%   BMI 24.14 kg/m   Physical Exam Vitals signs reviewed.  Constitutional:      Appearance: Normal appearance.  HENT:     Head: Normocephalic and atraumatic.  Neck:     Musculoskeletal: Neck supple.  Cardiovascular:     Rate and Rhythm:  Normal rate and regular rhythm.     Pulses: Normal pulses.     Heart sounds: Normal heart sounds.  Pulmonary:     Effort: Pulmonary effort is normal.     Breath sounds: Normal breath sounds.  Skin:    Comments: Maculopapular rash following a linear pattern of left forearm and thighs bilaterally. Similar non-linear rash of left cheek. Some vesicles noted of RLE with no current oozing or sign of superimposed bacterial infection.  Neurological:     Mental Status: She is alert.  Psychiatric:        Mood and Affect: Mood normal.    Recent Results (from the past 2160 hour(s))  CBC with Differential ([place] Only)     Status: None   Collection Time: 04/07/18  2:46 PM  Result Value Ref Range   WBC Count 5.8 4.0 - 10.5 K/uL   RBC 4.58 3.87 - 5.11 MIL/uL   Hemoglobin 13.9 12.0 - 15.0 g/dL   HCT 42.8 36.0 - 46.0 %   MCV 93.4 80.0 - 100.0 fL   MCH 30.3 26.0 - 34.0 pg   MCHC 32.5 30.0 - 36.0 g/dL   RDW 13.4 11.5 - 15.5 %   Platelet Count 227 150 - 400 K/uL   nRBC 0.0 0.0 - 0.2 %   Neutrophils Relative % 73 %   Neutro Abs 4.3 1.7 - 7.7 K/uL   Lymphocytes Relative 15 %   Lymphs Abs 0.9 0.7 - 4.0 K/uL   Monocytes Relative 9 %   Monocytes Absolute 0.5 0.1 - 1.0 K/uL   Eosinophils Relative 2 %   Eosinophils Absolute 0.1 0.0 - 0.5 K/uL   Basophils Relative 1 %   Basophils Absolute 0.0 0.0 - 0.1 K/uL   Immature Granulocytes 0 %   Abs Immature Granulocytes 0.02 0.00 - 0.07 K/uL    Comment: Performed at [facility], [address] [place], [place] [address]  CMP ([place] only)     Status: Abnormal   Collection Time: 04/07/18  2:46 PM  Result Value Ref Range   Sodium 138 135 - 145 mmol/L   Potassium 4.1 3.5 - 5.1 mmol/L   Chloride 102 98 - 111 mmol/L   CO2 31 22 - 32 mmol/L   Glucose, Bld  123 (H) 70 - 99 mg/dL   BUN 22 8 - 23 mg/dL   Creatinine 1.05 (H) 0.44 - 1.00 mg/dL   Calcium 9.1 8.9 - 10.3 mg/dL   Total Protein 6.9 6.5 - 8.1  g/dL   Albumin 4.3 3.5 - 5.0 g/dL   AST 14 (L) 15 - 41 U/L   ALT 4 0 - 44 U/L   Alkaline Phosphatase 39 38 - 126 U/L   Total Bilirubin 0.3 0.3 - 1.2 mg/dL   GFR, Est Non Af Am 52 (L) >60 mL/min   GFR, Est AFR Am >60 >60 mL/min   Anion gap 5 5 - 15    Comment: Performed at [facility], [address] [place], [name] [address]  VITAMIN D 25 Hydroxy (Vit-D Deficiency, Fractures)     Status: None   Collection Time: 04/12/18  1:48 PM  Result Value Ref Range   VITD 72.81 30.00 - 100.00 ng/mL  POCT urinalysis dipstick     Status: Abnormal   Collection Time: 05/13/18  1:43 PM  Result Value Ref Range   Color, UA yellow    Clarity, UA clear    Glucose, UA Negative Negative   Bilirubin, UA negative    Ketones, UA negative    Spec Grav, UA 1.020 1.010 - 1.025   Blood, UA positive    pH, UA 6.0 5.0 - 8.0   Protein, UA Negative Negative   Urobilinogen, UA 0.2 0.2 or 1.0 E.U./dL   Nitrite, UA negative    Leukocytes, UA Trace (A) Negative   Appearance     Odor    Urine Culture     Status: Abnormal   Collection Time: 05/13/18  2:03 PM  Result Value Ref Range   MICRO NUMBER: [id number]    SPECIMEN QUALITY: Adequate    Sample Source URINE    STATUS: FINAL    ISOLATE 1: Klebsiella pneumoniae (A)     Comment: 10,000-50,000 CFU/mL of Klebsiella pneumoniae   ISOLATE 2: Enterococcus faecalis (A)     Comment: Greater than 100,000 CFU/mL of Enterococcus faecalis      Susceptibility   Enterococcus faecalis - URINE CULTURE POSITIVE 2    VANCOMYCIN 2 Sensitive     AMPICILLIN <=2 Sensitive     NITROFURANTOIN* <=16 Sensitive      * For infections other than uncomplicated UTIcaused by E. coli, K. pneumoniae or P. mirabilis:Cefazolin is resistant if MIC > or = 8 mcg/mL.(Distinguishing susceptible versus intermediatefor isolates with MIC < or = 4 mcg/mL requiresadditional testing.)For uncomplicated UTI caused by E. coli,K. pneumoniae or P. mirabilis: Cefazolin  issusceptible if MIC <32 mcg/mL and predictssusceptible to the oral agents cefaclor, cefdinir,cefpodoxime, cefprozil, cefuroxime, cephalexinand loracarbef.Legend:S = Susceptible  I = IntermediateR = Resistant  NS = Not susceptible* = Not tested  NR = Not reported**NN = See antimicrobic comments   Klebsiella pneumoniae - URINE CULTURE, REFLEX    AMOX/CLAVULANIC 4 Sensitive     AMPICILLIN 16 Resistant     AMPICILLIN/SULBACTAM 4 Sensitive     CEFAZOLIN* <=4 Not Reportable      * For infections other than uncomplicated UTIcaused by E. coli, K. pneumoniae or P. mirabilis:Cefazolin is resistant if MIC > or = 8 mcg/mL.(Distinguishing susceptible versus intermediatefor isolates with MIC < or = 4 mcg/mL requiresadditional testing.)For uncomplicated UTI caused by E. coli,K. pneumoniae or P. mirabilis: Cefazolin issusceptible if MIC <32 mcg/mL and predictssusceptible to the oral agents cefaclor, cefdinir,cefpodoxime, cefprozil, cefuroxime,  cephalexinand loracarbef.    CEFEPIME <=1 Sensitive     CEFTRIAXONE <=1 Sensitive     CIPROFLOXACIN <=0.25 Sensitive     LEVOFLOXACIN <=0.12 Sensitive     ERTAPENEM <=0.5 Sensitive     GENTAMICIN <=1 Sensitive     IMIPENEM <=0.25 Sensitive     NITROFURANTOIN <=16 Sensitive     PIP/TAZO <=4 Sensitive     TOBRAMYCIN <=1 Sensitive     TRIMETH/SULFA <=20 Sensitive     Assessment/Plan: 1. Rhus dermatitis Significant especially on RLE but without signs of secondary bacterial infections. Start Witch hazel to dry the lesions of leg. Begin Kenalog ointment BID. Start steroid taper -- 12 day. Strict return precautions reviewed. - predniSONE (DELTASONE) 10 MG tablet; Take 4 tablets (40 mg total) by mouth daily with breakfast for 3 days, THEN 3 tablets (30 mg total) daily with breakfast for 3 days, THEN 2 tablets (20 mg total) daily with breakfast for 3 days, THEN 1 tablet (10 mg total) daily with breakfast for 3 days.  Dispense: 30 tablet; Refill: 0 - triamcinolone ointment  (KENALOG) 0.5 %; Apply 1 application topically 2 (two) times daily.  Dispense: 30 g; Refill: 0  2. Cyst of lip Noted at the end of visit. Wants referral. Referral placed to Dermatology. On exam, question small dermoid sit just inferior to lower right [place] border.. - Ambulatory referral to Dermatology   Leeanne Rio, PA-C

## 2018-06-16 ENCOUNTER — Ambulatory Visit: Payer: Medicare HMO | Admitting: Physician Assistant

## 2018-06-22 ENCOUNTER — Other Ambulatory Visit: Payer: Self-pay | Admitting: Allergy and Immunology

## 2018-07-05 ENCOUNTER — Telehealth: Payer: Self-pay | Admitting: Neurology

## 2018-07-05 NOTE — Telephone Encounter (Signed)
Pt is wanting to do a telephone visit for her appt in 07/14/2018.

## 2018-07-05 NOTE — Telephone Encounter (Signed)
Noted. Will contact pt has appt get's closer for pre charting.

## 2018-07-12 ENCOUNTER — Other Ambulatory Visit: Payer: Self-pay | Admitting: *Deleted

## 2018-07-12 MED ORDER — LEVOTHYROXINE SODIUM 88 MCG PO TABS: 88.0000 ug | ORAL_TABLET | Freq: Every day | ORAL | 1 refills | Status: DC

## 2018-07-13 NOTE — Telephone Encounter (Signed)
Pt has returned call to [facility], she is asking for a call back

## 2018-07-13 NOTE — Addendum Note (Signed)
Addended by: Verlin Grills T on: 07/13/2018 04:30 PM   Modules accepted: Orders

## 2018-07-13 NOTE — Telephone Encounter (Signed)
I contacted the pt and completed the pre charting for tomorrows telephone visit.  Pt wanted it noted her pharmacy dispensed sinemet 25-100 mg 1 tab tid. I advised the last refill we sent was for 1 tab in the morning/evening and then 1/2 tab at midday. I advised the pt to contact her pharm to confirm who sent the rx. Pt verbalized understanding.  Pt states she would like to discuss an issue with her foot tomorrow as well.

## 2018-07-13 NOTE — Telephone Encounter (Signed)
Requested a call back from the pt to complete pre charting for telephone visit scheduled for 07/14/18.

## 2018-07-14 ENCOUNTER — Other Ambulatory Visit: Payer: Self-pay

## 2018-07-14 ENCOUNTER — Telehealth: Payer: Self-pay | Admitting: Neurology

## 2018-07-14 ENCOUNTER — Ambulatory Visit (INDEPENDENT_AMBULATORY_CARE_PROVIDER_SITE_OTHER): Payer: Medicare HMO | Admitting: Neurology

## 2018-07-14 ENCOUNTER — Telehealth: Payer: Self-pay | Admitting: *Deleted

## 2018-07-14 ENCOUNTER — Encounter: Payer: Self-pay | Admitting: Neurology

## 2018-07-14 DIAGNOSIS — G2581 Restless legs syndrome: Secondary | ICD-10-CM | POA: Diagnosis not present

## 2018-07-14 DIAGNOSIS — G20 Parkinson's disease: Secondary | ICD-10-CM

## 2018-07-14 DIAGNOSIS — G4752 REM sleep behavior disorder: Secondary | ICD-10-CM

## 2018-07-14 HISTORY — DX: Restless legs syndrome: G25.81

## 2018-07-14 HISTORY — DX: REM sleep behavior disorder: G47.52

## 2018-07-14 MED ORDER — CARBIDOPA-LEVODOPA ER 25-100 MG PO TBCR: 1.0000 | EXTENDED_RELEASE_TABLET | Freq: Four times a day (QID) | ORAL | 1 refills | Status: DC

## 2018-07-14 NOTE — Progress Notes (Signed)
     Virtual Visit via Telephone Note  I connected with Julia [name] on 07/14/18 at 10:30 AM EDT by telephone and verified that I am speaking with the correct person using two identifiers.   I discussed the limitations, risks, security and privacy concerns of performing an evaluation and management service by telephone and the availability of in person appointments. I also discussed with the patient that there may be a patient responsible charge related to this service. The patient expressed understanding and agreed to proceed.   History of Present Illness: Julia [name] is a 78 year old right-handed white female with a history of Parkinson's disease.  The patient has some issues with left-sided tremors that have continued.  The patient has noted increased tremors after periods of exercise.  She has had no other changes in activities of daily living.  She has noted that her ability to walk has slowed down somewhat.  She has just recently started Sinemet taking the 25/100 mg tablets 3 times daily.  She notes that she feels somewhat poorly about 2 hours after dosing but this gradually wears off.  She has had some troubles with constipation and some occasional fecal incontinence, she has set up an appointment to see the gastroenterologist.  She has had some problems with right foot pain and some leg stiffness at night when she lies down, this does not bother her during the day.  She may have some unusual dreams at night.  She has had some problems with hair loss and cracking of her fingernails.  She does have a history of hypothyroidism.  The patient reports no falls, she denies any issues with swallowing, she believes that there may be a slight memory problem developing.  She otherwise is doing relatively well, she functions fairly well during the day.   Observations/Objective: On the telephone evaluation, the patient appears to have a normal mental status, responding to questions appropriately.  The  patient has normal speech with no evidence of aphasia, dysphonia, or dysarthria.  Assessment and Plan: 1.  Parkinson's disease  2.  Tremor  3.  Reports of mild memory problems  4.  REM sleep behavior disorder  5.  Restless leg syndrome  6.  Constipation  The patient does have several issues associated with Parkinson's disease.  The patient is feeling poorly about 2 hours after her medication dosing, we will switch to a Sinemet CR tablet 25/100 mg tablet taking 1 up to 4 times daily, she will take the extra fourth dose in the evening to help the restless leg syndrome.  She will call me for any dose adjustments of the medication.  She will follow-up here in about 4 months.  She is to remain active and to continue to exercise.  She does not wish to have treatment for the REM sleep disorder currently.  Follow Up Instructions: Follow-up with me in 4 months.   I discussed the assessment and treatment plan with the patient. The patient was provided an opportunity to ask questions and all were answered. The patient agreed with the plan and demonstrated an understanding of the instructions.   The patient was advised to call back or seek an in-person evaluation if the symptoms worsen or if the condition fails to improve as anticipated.  I provided 30 minutes of non-face-to-face time during this encounter.   Kathrynn Ducking, MD

## 2018-07-14 NOTE — Telephone Encounter (Signed)
Pt is asking for a call to discuss how much time should there be between her taking the Carbidopa-Levodopa ER (SINEMET CR) 25-100 MG tablet controlled release

## 2018-07-14 NOTE — Telephone Encounter (Signed)
Message left for patient to please call back to schedule a 4 month follow up

## 2018-07-14 NOTE — Telephone Encounter (Addendum)
Requested a call back from the pt to further discuss this question. [facility]'s # and office hours were provided.

## 2018-07-18 NOTE — Telephone Encounter (Signed)
I contacted the pt and was able to discuss her medication question. She wanted to confirm why she was prescribed Sinemet ER 25-100 ER. I advised Dr. Eugenie Birks had recommended this medication to replace her previous 25-100 regular release Sinemet dosage.  I advised Dr. Jannifer Franklin had recommended the 4th tablet to help with RLS.  Pt verbalized understanding and wanted to report she has been experiencing some mild nausea about 30 mins after taking each dosage of the sinemet. I advised the nausea could be related to the constipation ( pt confirmed this is still an issue).  Pt verbalized understanding and states she will call back in a few weeks to let us know how the 4 tablets daily of ER Sinemet 25-100 mg is going and if any adjustments are needed

## 2018-07-19 ENCOUNTER — Ambulatory Visit: Payer: Medicare HMO | Admitting: Gastroenterology

## 2018-08-02 ENCOUNTER — Ambulatory Visit: Payer: Medicare HMO | Admitting: Allergy and Immunology

## 2018-08-23 ENCOUNTER — Telehealth: Payer: Self-pay | Admitting: General Surgery

## 2018-08-23 NOTE — Telephone Encounter (Signed)
Covid-19 travel screening questions  Have you traveled in the last 14 days? NO If yes where?  Do you now or have you had a fever in the last 14 days? NO  Do you have any respiratory symptoms of shortness of breath or cough now or in the last 14 days? NO  Do you have a medical history of Congestive Heart Failure? NO  Do you have a medical history of lung disease? NO  Do you have any family members or close contacts with diagnosed or suspected Covid-19? NO  Expressed to patient to wear a mask and to bring no one to the visit with her.

## 2018-08-23 NOTE — Telephone Encounter (Signed)
After speaking with Rovanda, CMA for Dr Rush Landmark the patient should be scheduled for a phone visit. Contacted the patient and explained this to her and she was in agreeable to a phone visit.

## 2018-08-24 ENCOUNTER — Ambulatory Visit (INDEPENDENT_AMBULATORY_CARE_PROVIDER_SITE_OTHER): Payer: Medicare HMO | Admitting: Gastroenterology

## 2018-08-24 ENCOUNTER — Other Ambulatory Visit: Payer: Self-pay

## 2018-08-24 DIAGNOSIS — Z90710 Acquired absence of both cervix and uterus: Secondary | ICD-10-CM

## 2018-08-24 DIAGNOSIS — R194 Change in bowel habit: Secondary | ICD-10-CM

## 2018-08-24 DIAGNOSIS — M6289 Other specified disorders of muscle: Secondary | ICD-10-CM

## 2018-08-24 DIAGNOSIS — R151 Fecal smearing: Secondary | ICD-10-CM | POA: Diagnosis not present

## 2018-08-24 DIAGNOSIS — Z85038 Personal history of other malignant neoplasm of large intestine: Secondary | ICD-10-CM | POA: Diagnosis not present

## 2018-08-24 DIAGNOSIS — R152 Fecal urgency: Secondary | ICD-10-CM

## 2018-08-24 NOTE — Progress Notes (Signed)
[facility] VISIT   Primary Care Provider Leamon Arnt, [place] [address] [phone]  Referring Provider Leamon Arnt, [place] [address] [place], [place] [address] [phone]  Patient Profile: Julia [name] is a 78 y.o. 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  The patient presents to the [facility] for an evaluation and management of problem(s) noted below:  Problem List 1. Fecal urgency   2. Fecal smearing   3. Pelvic floor dysfunction   4. History of colon cancer   5. Change in bowel habits   6. History of hysterectomy     History of Present Illness This is the patient's first visit to the outpatient [facility] clinic.  She recently moved to this area from [place].  She was previously followed by gastroenterology as well as by a colorectal surgeon.  She has a distant history of colorectal cancer status post hemicolectomy.  She reports having had colonoscopy follow-ups for a few years with the last one just in the last 2 years.  She reports no recent polyps being found.  She has also been seen by a uro-gynecologist in [place] and was told that she had a vaginocele versus rectocele and had at one point in time seen pelvic floor physical therapy for retraining and was supposed to do Kegel exercises.  After 2 visits with physical therapy she did not go back and she did not see continue to do her Kegel exercises.  She describes for years having issues of fecal incontinence.  But when you actually talk with her she describes fecal urgency and not true incontinence.  The patient reports having a bowel movement and may be normal on 1 day and then the next day she may have multiple bowel movements that are  pellet-like.  She will have fecal urgency and have a bowel movement but subsequently returned to the bathroom and have notation of fecal soilage on her toilet paper and sometimes in her underwear.  She was diagnosed with Parkinson's disease in 2019 and has been on medication since that period in time.  When asking her how long the symptoms have been going on she states they have been worse in the last couple years but reading her notes and hearing her history this is been going on for much longer.  In time.  She just wants to get back to having a normal life and not have to worry about her bowel issues.  GI Review of Systems Positive as above Negative for pyrosis, dysphagia, odynophagia, nausea, vomiting, abdominal pain, melena, hematochezia  Review of Systems General: Denies fevers/chills/weight loss HEENT: Denies oral lesions Cardiovascular: Denies chest pain Pulmonary: Denies shortness of breath Gastroenterological: See HPI Genitourinary: Denies darkened urine Hematological: Denies easy bruising/bleeding Endocrine: Denies temperature intolerance Dermatological: Denies jaundice Psychological: Mood is stable   Medications Current Outpatient Medications  Medication Sig Dispense Refill   Calcium Citrate-Vitamin D 315-250 MG-UNIT TABS Take by mouth.     Carbidopa-Levodopa ER (SINEMET CR) 25-100 MG tablet controlled release Take 1 tablet by mouth 4 (four) times daily. 360 tablet 1   EPINEPHrine 0.3 mg/0.3 mL IJ SOAJ injection      Fexofenadine HCl (ALLEGRA ALLERGY PO) Take by mouth.     fluticasone (FLONASE) 50 MCG/ACT nasal spray SPRAY TWO SPRAYS IN EACH NOSTRIL  ONCE DAILY 16 g 1   levothyroxine (SYNTHROID, LEVOTHROID) 88 MCG tablet Take 1 tablet (88 mcg total) by mouth daily before breakfast. 90 tablet 1   Multiple Vitamins-Minerals (CENTRUM SILVER PO) Take by mouth.     Probiotic Product (PROBIOTIC DAILY PO) Take by mouth.     No current facility-administered medications for  this visit.     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[facility])    Past Surgical History:  Procedure Laterality Date   ADENOIDECTOMY     BREAST LUMPECTOMY Left    radiation only   BUNIONECTOMY     COLON SURGERY     HEMORRHOID SURGERY     LAPAROSCOPY     NASAL SINUS SURGERY     OOPHORECTOMY     TONSILLECTOMY     TUBAL LIGATION     Social History   Socioeconomic History   Marital status: Married    Spouse name: Not on file   Number of children: Not on file   Years of education: 3   Highest education level: Master's degree (e.g., MA, MS, MEng, MEd, MSW, MBA)  Occupational History   Occupation: Retired Paediatric nurse strain: Not on Training and development officer insecurity:    Worry: Not on file    Inability: Not on Lexicographer needs:    Medical: Not on file    Non-medical: Not on file  Tobacco Use   Smoking status: Never Smoker   Smokeless tobacco: Never Used  Substance and Sexual Activity   Alcohol use: Yes    Comment: occ   Drug use: Never   Sexual activity: Not on file  Lifestyle   Physical activity:    Days per week: Not on file    Minutes per session: Not on file   Stress: Not on file  Relationships   Social connections:    Talks on phone: Not on file    Gets together: Not on file    Attends religious service: Not on file    Active member of club or organization: Not on file    Attends meetings of clubs or organizations: Not on file    Relationship status: Not on file   Intimate  partner violence:    Fear of current or ex partner: Not on file    Emotionally abused: Not on file    Physically abused: Not on file    Forced sexual activity: Not on file  Other Topics Concern   Not on file  Social History Narrative   Right handed    Lives with husband    Caffeine 1 cup daily    Family History  Problem Relation Age of Onset   Arthritis Mother    Diabetes Mother    Heart disease Mother    Hypertension Mother    Arthritis Father    Cancer Father    Depression Father    Osteoporosis Father    Arthritis Sister  Cancer Sister    Diabetes Sister    Cancer Brother    Cancer Sister    Cancer Sister    Hyperlipidemia Sister    Hyperlipidemia Brother    I have reviewed her medical, social, and family history in detail and updated the electronic medical record as necessary.    PHYSICAL EXAMINATION  Telehealth visit   REVIEW OF DATA  I reviewed the following data at the time of this encounter:  GI Procedures and Studies  Describes prior colonoscopy in last 2 years will work to try and get records  Laboratory Studies  Reviewed in epic  Imaging Studies  No relevant studies to review   ASSESSMENT  Julia [name] is a 78 y.o. female with a pmh significant for The patient is seen today for evaluation and management of:  1. Fecal urgency   2. Fecal smearing   3. Pelvic floor dysfunction   4. History of colon cancer   5. Change in bowel habits   6. History of hysterectomy    The patient is hemodynamically stable on our discussion today.  However, it seems for years the patient has been dealing with our symptoms are most likely a result of pelvic floor dyssynergia.  Unfortunately this was a telehealth visit so a full rectal exam cannot be performed to further evaluate her anal/sphincter tone.  However based on hearing snippets of the patient's prior history as well as prior doctors notes that she is able to read it seems that her symptoms are  consistent most likely with pelvic floor dyssynergia the possibility of having a rectocele or vaginocele as well.  I think that anorectal manometry would be reasonable to pursue, though, she states that she likely has had this done previously.  She has had pelvic floor retraining done in 2 occasions years ago but never followed through with the Kegel exercises.  She thought she may have been better with having had those performed previously.  We are going to increase her fiber intake with FiberCon and subsequently proceed with follow-up.  We will see her labs and colonoscopy records and prior work-ups that have been done by her colorectal surgeon as well as the urogynecology she previously seen.  She likely will benefit from repeat pelvic floor retraining plus or minus repeat anorectal manometry.  I have described and discussed with her in depth today on the phone for over 35 minutes that we may not be able to get her to never had issues but if we can optimize her that would be our intention.  She likely will benefit from colorectal surgery evaluation as well and may benefit from repeat anorectal manometry and pelvic floor retraining.  All patient questions were answered, to the best of my ability, and the patient agrees to the aforementioned plan of action with follow-up as indicated.   PLAN  Laboratories as outlined below to ensure things are okay metabolically endocrinologically Obtain work-up and prior notation from [facility] from colorectal surgery as well as urogynecology Consider anorectal manometry Consider pelvic floor retraining and PT We will consider role of diagnostic evaluation based on review of her records FiberCon once to twice daily in effort of trying to bulk her stools   Orders Placed This Encounter  Procedures   CBC   TSH   Cortisol   IgA   Tissue Transglutaminase Abs,IgG,IgA   INR/PT   Calcium, ionized    New Prescriptions   No medications on file   Modified  Medications  No medications on file    Planned Follow Up Return in about 5 weeks (around 09/28/2018).   Justice Britain, MD [facility] Office # [phone]

## 2018-08-24 NOTE — Patient Instructions (Addendum)
Your provider has requested that you go to the basement level for lab work before leaving today. Press "B" on the elevator. The lab is located at the first door on the left as you exit the elevator.   Start fibercon once daily for 2-3 weeks, can then  increase to twice daily if bowel movements are not getting soft.   We will attempt to retrieve records from [facility], and Dr. Alger Memos [facility].   We will schedule you for a 4-6 week follow-up Telehealth visit.   Thank you for choosing me and [facility].  Dr. Rush Landmark

## 2018-08-26 DIAGNOSIS — R151 Fecal smearing: Secondary | ICD-10-CM | POA: Insufficient documentation

## 2018-08-26 DIAGNOSIS — M6289 Other specified disorders of muscle: Secondary | ICD-10-CM | POA: Insufficient documentation

## 2018-08-26 DIAGNOSIS — R194 Change in bowel habit: Secondary | ICD-10-CM | POA: Insufficient documentation

## 2018-08-26 DIAGNOSIS — Z90710 Acquired absence of both cervix and uterus: Secondary | ICD-10-CM | POA: Insufficient documentation

## 2018-08-26 DIAGNOSIS — R152 Fecal urgency: Secondary | ICD-10-CM | POA: Insufficient documentation

## 2018-09-20 ENCOUNTER — Encounter: Payer: Self-pay | Admitting: Allergy and Immunology

## 2018-09-20 ENCOUNTER — Ambulatory Visit: Payer: Medicare HMO

## 2018-09-20 ENCOUNTER — Ambulatory Visit (INDEPENDENT_AMBULATORY_CARE_PROVIDER_SITE_OTHER): Payer: Medicare HMO | Admitting: Allergy and Immunology

## 2018-09-20 ENCOUNTER — Other Ambulatory Visit: Payer: Self-pay

## 2018-09-20 DIAGNOSIS — Z8709 Personal history of other diseases of the respiratory system: Secondary | ICD-10-CM

## 2018-09-20 DIAGNOSIS — J3089 Other allergic rhinitis: Secondary | ICD-10-CM | POA: Diagnosis not present

## 2018-09-20 DIAGNOSIS — J309 Allergic rhinitis, unspecified: Secondary | ICD-10-CM

## 2018-09-20 MED ORDER — FLUTICASONE PROPIONATE 50 MCG/ACT NA SUSP: NASAL | 1 refills | Status: DC

## 2018-09-20 NOTE — Assessment & Plan Note (Addendum)
Stable.  Continue appropriate allergen avoidance measures, immunotherapy injections as prescribed, fluticasone nasal spray daily, and fexofenadine if needed.  Nasal saline spray (i.e. Simply Saline) is recommended prior to medicated nasal sprays and as needed.

## 2018-09-20 NOTE — Progress Notes (Signed)
Follow-up Telemedicine Note  RE: Julia [name] MRN: [id number] DOB: [birth year] Date of Telemedicine Visit: 09/20/2018  Primary care provider: Leamon Arnt, MD Referring provider: Leamon Arnt, MD  Telemedicine Follow Up Visit via Telephone: I connected with Julia [name] for a follow up on 09/20/18 by telephone and verified that I am speaking with the correct person using two identifiers.   The limitations, risks, security and privacy concerns of performing an evaluation and management service by telemedicine, the availability of in person appointments, and that there may be a patient responsible charge related to this service were discussed. The patient expressed understanding and agreed to proceed.  Patient is at home.  Provider is at the office.  Visit start time: 9:57 AM Visit end time: 10:12 AM Insurance consent/check in by: Anderson Malta Medical consent and medical assistant/nurse: Loma Sousa  History of present illness: Julia [name] is a 78 y.o. 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  She was previously seen in this clinic for her initial evaluation on February 01, 2018.  She reports that her nasal allergy symptoms have been well controlled with the allergy injections as well as fluticasone nasal spray daily.  She notes that she has missed the last 3 rounds of allergy injections.  She has no complaints today or new problems.  She needs a refill prescription for fluticasone nasal spray.  Assessment and plan: Perennial and seasonal allergic rhinitis Stable.  Continue appropriate allergen avoidance measures, immunotherapy injections as prescribed, fluticasone nasal spray daily, and fexofenadine if needed.  Nasal saline spray (i.e. Simply Saline) is recommended prior to medicated nasal sprays and as needed.  History of nasal polyp Status post polypectomy.   Continue fluticasone nasal spray daily to prevent recurrence.     Meds ordered this encounter  Medications  . fluticasone (FLONASE) 50 MCG/ACT nasal spray    Sig: SPRAY TWO SPRAYS IN EACH NOSTRIL ONCE DAILY AS NEEDED    Dispense:  16 g    Refill:  1    Diagnostics: None.   Physical examination: Physical Exam Not obtained as encounter was done via telephone.   The following portions of the patient's history were reviewed and updated as appropriate: allergies, current medications, past family history, past medical history, past social history, past surgical history and problem list.  Allergies as of 09/20/2018      Reactions   Cefdinir    Cholestyramine    GERD   Ezetimibe    Meperidine    Nitrofurantoin Diarrhea   Statins    Teriparatide       Medication List       Accurate as of September 20, 2018  2:13 PM. If you have any questions, ask your nurse or doctor.        ALLEGRA ALLERGY PO Take by mouth.   Calcium Citrate-Vitamin D 315-250 MG-UNIT Tabs Take by mouth.   Carbidopa-Levodopa ER 25-100 MG tablet controlled release Commonly known as: Sinemet CR Take 1 tablet by mouth 4 (four) times daily.   CENTRUM SILVER PO Take by mouth.   EPINEPHrine 0.3 mg/0.3 mL Soaj injection Commonly known as: EPI-PEN   fluticasone 50 MCG/ACT nasal spray Commonly known as: FLONASE SPRAY TWO SPRAYS IN EACH NOSTRIL ONCE DAILY AS NEEDED What changed: See the new instructions. Changed by: Edmonia Lynch, MD   levothyroxine 88 MCG tablet Commonly known as: SYNTHROID Take 1 tablet (88  mcg total) by mouth daily before breakfast.   PROBIOTIC DAILY PO Take by mouth.   tamoxifen 20 MG tablet Commonly known as: NOLVADEX       Allergies  Allergen Reactions  . Cefdinir   . Cholestyramine     GERD  . Ezetimibe   . Meperidine   . Nitrofurantoin Diarrhea  . Statins   . Teriparatide     Previous notes and tests were reviewed.  I discussed the assessment and treatment  plan with the patient. The patient was provided an opportunity to ask questions and all were answered. The patient agreed with the plan and demonstrated an understanding of the instructions.   The patient was advised to call back or seek an in-person evaluation if the symptoms worsen or if the condition fails to improve as anticipated.  I provided 15 minutes of non-face-to-face time during this encounter.  I appreciate the opportunity to take part in [facility] care. Please do not hesitate to contact me with questions.  Sincerely,   R. Edgar Frisk, MD

## 2018-09-20 NOTE — Patient Instructions (Addendum)
Perennial and seasonal allergic rhinitis Stable.  Continue appropriate allergen avoidance measures, immunotherapy injections as prescribed, fluticasone nasal spray daily, and fexofenadine if needed.  Nasal saline spray (i.e. Simply Saline) is recommended prior to medicated nasal sprays and as needed.  History of nasal polyp Status post polypectomy.  Continue fluticasone nasal spray daily to prevent recurrence.     Return in about 1 year (around 09/20/2019), or if symptoms worsen or fail to improve.

## 2018-09-20 NOTE — Assessment & Plan Note (Signed)
Status post polypectomy.  Continue fluticasone nasal spray daily to prevent recurrence. 

## 2018-09-27 ENCOUNTER — Other Ambulatory Visit: Payer: Self-pay

## 2018-09-27 ENCOUNTER — Ambulatory Visit (INDEPENDENT_AMBULATORY_CARE_PROVIDER_SITE_OTHER): Payer: Medicare HMO

## 2018-09-27 DIAGNOSIS — J309 Allergic rhinitis, unspecified: Secondary | ICD-10-CM | POA: Diagnosis not present

## 2018-10-06 ENCOUNTER — Telehealth: Payer: Self-pay | Admitting: Family

## 2018-10-06 ENCOUNTER — Inpatient Hospital Stay: Payer: Medicare HMO | Attending: Hematology | Admitting: Hematology

## 2018-10-06 ENCOUNTER — Inpatient Hospital Stay: Payer: Medicare HMO

## 2018-10-06 ENCOUNTER — Other Ambulatory Visit: Payer: Self-pay

## 2018-10-06 ENCOUNTER — Encounter: Payer: Self-pay | Admitting: Hematology

## 2018-10-06 VITALS — BP 138/75 | HR 96 | Resp 17 | Wt 133.0 lb

## 2018-10-06 DIAGNOSIS — G20 Parkinson's disease: Secondary | ICD-10-CM | POA: Diagnosis not present

## 2018-10-06 DIAGNOSIS — Z170 Estrogen receptor positive status [ER+]: Secondary | ICD-10-CM

## 2018-10-06 DIAGNOSIS — Z85038 Personal history of other malignant neoplasm of large intestine: Secondary | ICD-10-CM | POA: Diagnosis not present

## 2018-10-06 DIAGNOSIS — Z923 Personal history of irradiation: Secondary | ICD-10-CM

## 2018-10-06 DIAGNOSIS — Z79810 Long term (current) use of selective estrogen receptor modulators (SERMs): Secondary | ICD-10-CM | POA: Diagnosis not present

## 2018-10-06 DIAGNOSIS — N8110 Cystocele, unspecified: Secondary | ICD-10-CM

## 2018-10-06 DIAGNOSIS — C50912 Malignant neoplasm of unspecified site of left female breast: Secondary | ICD-10-CM | POA: Diagnosis not present

## 2018-10-06 LAB — CBC WITH DIFFERENTIAL (CANCER CENTER ONLY)
Abs Immature Granulocytes: 0.03 10*3/uL (ref 0.00–0.07)
Basophils Absolute: 0 10*3/uL (ref 0.0–0.1)
Basophils Relative: 1 %
Eosinophils Absolute: 0.2 10*3/uL (ref 0.0–0.5)
Eosinophils Relative: 3 %
HCT: 41.9 % (ref 36.0–46.0)
Hemoglobin: 13.6 g/dL (ref 12.0–15.0)
Immature Granulocytes: 0 %
Lymphocytes Relative: 14 %
Lymphs Abs: 1 10*3/uL (ref 0.7–4.0)
MCH: 30.5 pg (ref 26.0–34.0)
MCHC: 32.5 g/dL (ref 30.0–36.0)
MCV: 93.9 fL (ref 80.0–100.0)
Monocytes Absolute: 0.5 10*3/uL (ref 0.1–1.0)
Monocytes Relative: 8 %
Neutro Abs: 5 10*3/uL (ref 1.7–7.7)
Neutrophils Relative %: 74 %
Platelet Count: 232 10*3/uL (ref 150–400)
RBC: 4.46 MIL/uL (ref 3.87–5.11)
RDW: 13.2 % (ref 11.5–15.5)
WBC Count: 6.8 10*3/uL (ref 4.0–10.5)
nRBC: 0 % (ref 0.0–0.2)

## 2018-10-06 LAB — CMP (CANCER CENTER ONLY)
ALT: 5 U/L (ref 0–44)
AST: 14 U/L — ABNORMAL LOW (ref 15–41)
Albumin: 4.2 g/dL (ref 3.5–5.0)
Alkaline Phosphatase: 36 U/L — ABNORMAL LOW (ref 38–126)
Anion gap: 6 (ref 5–15)
BUN: 21 mg/dL (ref 8–23)
CO2: 29 mmol/L (ref 22–32)
Calcium: 8.4 mg/dL — ABNORMAL LOW (ref 8.9–10.3)
Chloride: 102 mmol/L (ref 98–111)
Creatinine: 0.9 mg/dL (ref 0.44–1.00)
GFR, Est AFR Am: >60 mL/min (ref >60)
GFR, Estimated: >60 mL/min (ref >60)
Glucose, Bld: 154 mg/dL — ABNORMAL HIGH (ref 70–99)
Potassium: 3.9 mmol/L (ref 3.5–5.1)
Sodium: 137 mmol/L (ref 135–145)
Total Bilirubin: 0.4 mg/dL (ref 0.3–1.2)
Total Protein: 6.4 g/dL — ABNORMAL LOW (ref 6.5–8.1)

## 2018-10-06 MED ORDER — TAMOXIFEN CITRATE 20 MG PO TABS: 20.0000 mg | ORAL_TABLET | Freq: Every day | ORAL | 3 refills | Status: AC

## 2018-10-06 NOTE — Progress Notes (Signed)
[facility] OFFICE PROGRESS NOTE  Patient Care Team: Leamon Arnt, MD as PCP - General (Family Medicine) Tish Men, MD as Consulting Physician (Oncology) Kathrynn Ducking, MD as Consulting Physician (Neurology)  HEME/ONC OVERVIEW: 1. 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) -Early 02/2015: left breast lump biopsy showed IDC, Grade 2, ER+, PR-, HER2- -03/04/2015: left lumpectomy with SLN; path showed 2cm IDC, grade 2, 2/5 SLN positive for carcinoma; treated with adjuvant RT; NGS negative -Multiple switches in hormonal therapies due to subjective toxicity; currently on tamoxifen    2. History of recurrent colon cancer -12/2000: Stage III adenocarcinoma of the cecum, s/p right hemicolectomy and 6 months of adjuvant 5-FU using NSABP protocol -08/2002: pelvic relapse of the colon cancer, treated with TAH/BSO, omentectomy, LN dissection and resection of peritoneal tumors followed by 6 months of adjuvant FOLFOX6  3. 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, BRCA- -Currently tolerating tamoxifen well without significant side effects  -Continue tamoxifen, plan for at least 5 years (ie 2022); may be longer due to positive sentinel LN  -MMG unremarkable in 03/2018; next due in 1 year (ie 03/2019) -No need for gynecologic surveillance indicated (increased risk of endometrial cancer secondary to tamoxifen) due to hsitory of completed hysterectomy  History of recurrent colon cancer -Patient was referred to GI for colon cancer surveillance but she was reluctant to go to the appt -I  counseled the patient on the importance of colon cancer surveillance, given her history of recurrent colon cancer -Patient expressed understanding, and agreed with the plan  Parkinson's disease -Currently on carbidopa/levodopa  -Continue follow-up with neurology  Bladder prolapse  -Mostly secondary to extensive intra-abdominal surgeries -Patient requested to have referral to urology for further evaluation; I have placed referral to urology   Orders Placed This Encounter  Procedures  . Ambulatory referral to Urology    Referral Priority:   Routine    Referral Type:   Consultation    Referral Reason:   Specialty Services Required    Requested Specialty:   Urology    Number of Visits Requested:   1    All questions were answered. The patient knows to call the clinic with any problems, questions or concerns. No barriers to learning was detected.  A total of more than 25 minutes were spent face-to-face with the patient during this encounter and over half of that time was spent on counseling and coordination of care as outlined above.   Return in 6 months for labs and clinic follow-up.  Tish Men, MD 10/06/2018 12:30 PM  CHIEF COMPLAINT: "I am doing okay"  INTERVAL HISTORY: Ms. Beason returns to clinic for follow-up with history of left breast cancer.  Patient reports that she has been tolerating tamoxifen relatively well, and denies any new side effects.  She has been losing some hair, as well as having some hip/muscle soreness from her Prolia injection for osteoporosis.  She has some intermittent shortness of breath, usually with climbing stairs, but she denies any significant dyspnea at rest or with ADLs.  She denies any chest pain, hemoptysis or unilateral lower extremity swelling.  She has been having some bladder prolapse since her extensive  surgeries in 2000's, and would like a referral to urology.  She denies any other complaint today.  REVIEW OF SYSTEMS:   Constitutional: ( - )  fevers, ( - )  chills , ( - ) night sweats Eyes: ( - ) blurriness of vision, ( - ) double vision, ( - ) watery eyes Ears, nose, mouth, throat, and face: ( - ) mucositis, ( - ) sore throat Respiratory: ( - ) cough, ( + ) dyspnea, ( - ) wheezes Cardiovascular: ( - ) palpitation, ( - ) chest discomfort, ( - ) lower extremity swelling Gastrointestinal:  ( - ) nausea, ( - ) heartburn, ( - ) change in bowel habits Skin: ( - ) abnormal skin rashes Lymphatics: ( - ) new lymphadenopathy, ( - ) easy bruising Neurological: ( - ) numbness, ( - ) tingling, ( - ) new weaknesses Behavioral/Psych: ( - ) mood change, ( - ) new changes  All other systems were reviewed with the patient and are negative.  I have reviewed the past medical history, past surgical history, social history and family history with the patient and they are unchanged from previous note.  ALLERGIES:  is allergic to cefdinir; cholestyramine; ezetimibe; meperidine; nitrofurantoin; statins; and teriparatide.  MEDICATIONS:  Current Outpatient Medications  Medication Sig Dispense Refill  . Calcium Citrate-Vitamin D 315-250 MG-UNIT TABS Take by mouth.    . Carbidopa-Levodopa ER (SINEMET CR) 25-100 MG tablet controlled release Take 1 tablet by mouth 4 (four) times daily. 360 tablet 1  . EPINEPHrine 0.3 mg/0.3 mL IJ SOAJ injection     . Fexofenadine HCl (ALLEGRA ALLERGY PO) Take by mouth.    . fluticasone (FLONASE) 50 MCG/ACT nasal spray SPRAY TWO SPRAYS IN EACH NOSTRIL ONCE DAILY AS NEEDED 16 g 1  . levothyroxine (SYNTHROID, LEVOTHROID) 88 MCG tablet Take 1 tablet (88 mcg total) by mouth daily before breakfast. 90 tablet 1  . Multiple Vitamins-Minerals (CENTRUM SILVER PO) Take by mouth.    . Probiotic Product (PROBIOTIC DAILY PO) Take by mouth.    . tamoxifen (NOLVADEX) 20 MG tablet Take 1 tablet (20 mg total) by mouth daily. 90 tablet 3   No current facility-administered medications for this visit.     PHYSICAL EXAMINATION: ECOG  PERFORMANCE STATUS: 2 - Symptomatic, <50% confined to bed  Today's Vitals   10/06/18 1127 10/06/18 1128  BP: 138/75   Pulse: 96   Resp: 17   SpO2: 100%   Weight: 133 lb (60.3 kg)   PainSc:  0-No pain   Body mass index is 24.33 kg/m.  Filed Weights   10/06/18 1127  Weight: 133 lb (60.3 kg)    GENERAL: alert, no distress and comfortable SKIN: skin color, texture, turgor are normal, no rashes or significant lesions EYES: conjunctiva are pink and non-injected, sclera clear OROPHARYNX: no exudate, no erythema; lips, buccal mucosa, and tongue normal  NECK: supple, non-tender LUNGS: clear to auscultation with normal breathing effort HEART: regular rate & rhythm and no murmurs and no lower extremity edema ABDOMEN: soft, non-tender, non-distended, normal bowel sounds Musculoskeletal: no cyanosis of digits and no clubbing  PSYCH: alert & oriented x 3, fluent speech  LABORATORY DATA:  I have reviewed the data as listed    Component Value Date/Time   NA 137 10/06/2018 1045   NA 134 (A) 09/23/2016   K 3.9 10/06/2018 1045   CL 102 10/06/2018 1045   CO2 29 10/06/2018 1045   GLUCOSE 154 (H) 10/06/2018 1045   BUN  21 10/06/2018 1045   BUN 14 09/23/2016   CREATININE 0.90 10/06/2018 1045   CALCIUM 8.4 (L) 10/06/2018 1045   PROT 6.4 (L) 10/06/2018 1045   ALBUMIN 4.2 10/06/2018 1045   AST 14 (L) 10/06/2018 1045   ALT 5 10/06/2018 1045   ALKPHOS 36 (L) 10/06/2018 1045   BILITOT 0.4 10/06/2018 1045   GFRNONAA >60 10/06/2018 1045   GFRAA >60 10/06/2018 1045    No results found for: SPEP, UPEP  Lab Results  Component Value Date   WBC 6.8 10/06/2018   NEUTROABS 5.0 10/06/2018   HGB 13.6 10/06/2018   HCT 41.9 10/06/2018   MCV 93.9 10/06/2018   PLT 232 10/06/2018      Chemistry      Component Value Date/Time   NA 137 10/06/2018 1045   NA 134 (A) 09/23/2016   K 3.9 10/06/2018 1045   CL 102 10/06/2018 1045   CO2 29 10/06/2018 1045   BUN 21 10/06/2018 1045   BUN 14  09/23/2016   CREATININE 0.90 10/06/2018 1045   GLU 103 09/23/2016      Component Value Date/Time   CALCIUM 8.4 (L) 10/06/2018 1045   ALKPHOS 36 (L) 10/06/2018 1045   AST 14 (L) 10/06/2018 1045   ALT 5 10/06/2018 1045   BILITOT 0.4 10/06/2018 1045

## 2018-10-06 NOTE — Telephone Encounter (Signed)
Called  And spoke with patient regarding appointments per 7/9 los

## 2018-10-11 ENCOUNTER — Ambulatory Visit (INDEPENDENT_AMBULATORY_CARE_PROVIDER_SITE_OTHER): Payer: Medicare HMO

## 2018-10-11 ENCOUNTER — Other Ambulatory Visit: Payer: Self-pay

## 2018-10-11 DIAGNOSIS — J309 Allergic rhinitis, unspecified: Secondary | ICD-10-CM | POA: Diagnosis not present

## 2018-10-12 ENCOUNTER — Encounter: Payer: Self-pay | Admitting: Family Medicine

## 2018-10-12 ENCOUNTER — Ambulatory Visit (INDEPENDENT_AMBULATORY_CARE_PROVIDER_SITE_OTHER): Payer: Medicare HMO | Admitting: Family Medicine

## 2018-10-12 VITALS — BP 124/76 | HR 86 | Temp 97.6°F | Resp 14 | Ht 62.0 in | Wt 134.2 lb

## 2018-10-12 DIAGNOSIS — E063 Autoimmune thyroiditis: Secondary | ICD-10-CM | POA: Diagnosis not present

## 2018-10-12 DIAGNOSIS — L84 Corns and callosities: Secondary | ICD-10-CM | POA: Diagnosis not present

## 2018-10-12 DIAGNOSIS — R6881 Early satiety: Secondary | ICD-10-CM | POA: Diagnosis not present

## 2018-10-12 DIAGNOSIS — Z0000 Encounter for general adult medical examination without abnormal findings: Secondary | ICD-10-CM

## 2018-10-12 DIAGNOSIS — E038 Other specified hypothyroidism: Secondary | ICD-10-CM

## 2018-10-12 DIAGNOSIS — Z85038 Personal history of other malignant neoplasm of large intestine: Secondary | ICD-10-CM | POA: Diagnosis not present

## 2018-10-12 DIAGNOSIS — G20 Parkinson's disease: Secondary | ICD-10-CM | POA: Diagnosis not present

## 2018-10-12 DIAGNOSIS — R7301 Impaired fasting glucose: Secondary | ICD-10-CM | POA: Diagnosis not present

## 2018-10-12 DIAGNOSIS — E782 Mixed hyperlipidemia: Secondary | ICD-10-CM

## 2018-10-12 LAB — COMPREHENSIVE METABOLIC PANEL
ALT: 6 U/L (ref 0–35)
AST: 15 U/L (ref 0–37)
Albumin: 4.2 g/dL (ref 3.5–5.2)
Alkaline Phosphatase: 33 U/L — ABNORMAL LOW (ref 39–117)
BUN: 16 mg/dL (ref 6–23)
CO2: 29 mEq/L (ref 19–32)
Calcium: 9.2 mg/dL (ref 8.4–10.5)
Chloride: 101 mEq/L (ref 96–112)
Creatinine, Ser: 0.9 mg/dL (ref 0.40–1.20)
GFR: 60.86 mL/min (ref >60.00)
Glucose, Bld: 98 mg/dL (ref 70–99)
Potassium: 4.1 mEq/L (ref 3.5–5.1)
Sodium: 137 mEq/L (ref 135–145)
Total Bilirubin: 0.5 mg/dL (ref 0.2–1.2)
Total Protein: 6.3 g/dL (ref 6.0–8.3)

## 2018-10-12 LAB — CBC WITH DIFFERENTIAL/PLATELET
Basophils Absolute: 0.1 10*3/uL (ref 0.0–0.1)
Basophils Relative: 1.3 % (ref 0.0–3.0)
Eosinophils Absolute: 0.2 10*3/uL (ref 0.0–0.7)
Eosinophils Relative: 2.8 % (ref 0.0–5.0)
HCT: 40.8 % (ref 36.0–46.0)
Hemoglobin: 13.4 g/dL (ref 12.0–15.0)
Lymphocytes Relative: 11.9 % — ABNORMAL LOW (ref 12.0–46.0)
Lymphs Abs: 0.8 10*3/uL (ref 0.7–4.0)
MCHC: 32.7 g/dL (ref 30.0–36.0)
MCV: 93.6 fl (ref 78.0–100.0)
Monocytes Absolute: 0.5 10*3/uL (ref 0.1–1.0)
Monocytes Relative: 7 % (ref 3.0–12.0)
Neutro Abs: 5.3 10*3/uL (ref 1.4–7.7)
Neutrophils Relative %: 77 % (ref 43.0–77.0)
Platelets: 218 10*3/uL (ref 150.0–400.0)
RBC: 4.36 Mil/uL (ref 3.87–5.11)
RDW: 13.6 % (ref 11.5–15.5)
WBC: 6.8 10*3/uL (ref 4.0–10.5)

## 2018-10-12 LAB — LIPID PANEL
Cholesterol: 185 mg/dL (ref 0–200)
HDL: 79.5 mg/dL (ref >39.00)
LDL Cholesterol: 90 mg/dL (ref 0–99)
NonHDL: 105.69
Total CHOL/HDL Ratio: 2
Triglycerides: 77 mg/dL (ref 0.0–149.0)
VLDL: 15.4 mg/dL (ref 0.0–40.0)

## 2018-10-12 LAB — TSH: TSH: 0.99 u[IU]/mL (ref 0.35–4.50)

## 2018-10-12 LAB — HEMOGLOBIN A1C: Hgb A1c MFr Bld: 5.9 % (ref 4.6–6.5)

## 2018-10-12 MED ORDER — TRIAMCINOLONE ACETONIDE 0.1 % EX CREA: 1.0000 "application " | TOPICAL_CREAM | Freq: Two times a day (BID) | CUTANEOUS | 0 refills | Status: DC

## 2018-10-12 NOTE — Patient Instructions (Signed)
Please return in 6 weeks for recheck.  Start tums twice a day.  I will release your lab results to you on your MyChart account with further instructions. Please reply with any questions.  Use a metatarsal pad for your shoes.   If you have any questions or concerns, please don't hesitate to send me a message via MyChart or call the office at [phone]. Thank you for visiting with [place] today! It's our pleasure caring for you.  We will let you know when your next prolia injection is ready.

## 2018-10-12 NOTE — Progress Notes (Signed)
Subjective  Chief Complaint  Patient presents with  . Annual Exam    Fasting    HPI: Julia [name] is a 78 y.o. female who presents to [place] [facility] at [facility] today for a Female Wellness Visit. She also has the concerns and/or needs as listed above in the chief complaint. These will be addressed in addition to the Health Maintenance Visit.   Wellness Visit: annual visit with health maintenance review and exam without Pap   HM: up to date.  Chronic disease f/u and/or acute problem visit: (deemed necessary to be done in addition to the wellness visit):  Multiple chronic medical problems. Needs f/u on sugars and thyroid. Reports energy is fine. Denies sxs of hyperglycemia. Seeing neuro for parkinsons.   H/o colon and breast ca: oncology notes reviewed. Recent labs show low calcium. Nl albumin, and low protein.   Osteoporosis due for 2nd prolia injection.   C/o early satiety intermittently w/o pain or dysphagia or odynophagia. No gerd sxs. No weight loss. Appetite if fine. Increased bloating.   C/o painful corns bilateral feet   Assessment  1. Annual physical exam   2. Impaired fasting glucose   3. Mixed hyperlipidemia   4. Hypothyroidism due to Hashimoto's thyroiditis   5. Parkinson's disease ([place])   6. Hypocalcemia   7. History of colon cancer   8. Corns and callus   9. Early satiety      Plan  Female Wellness Visit:  Age appropriate Health Maintenance and Prevention measures were discussed with patient. Included topics are cancer screening recommendations, ways to keep healthy (see AVS) including dietary and exercise recommendations, regular eye and dental care, use of seat belts, and avoidance of moderate alcohol use and tobacco use.   BMI: discussed patient's BMI and encouraged positive lifestyle modifications to help get to or maintain a target BMI.  HM needs and immunizations were addressed and ordered. See below for orders. See HM and immunization  section for updates.  Routine labs and screening tests ordered including cmp, cbc and lipids where appropriate.  Discussed recommendations regarding Vit D and calcium supplementation (see AVS)  Chronic disease management visit and/or acute problem visit:  Check labs for thyroid, renal, lytes, lipids and calcium f/u  Continue current medications.   Osteoporosis: will defer prolia until ensure calcium is normal  Early satiety: monitor for now. If worsens or pain, then f/u with GI again.   rec metatarsal pads to feet.  Follow up: No follow-ups on file.  Orders Placed This Encounter  Procedures  . Comprehensive metabolic panel  . Lipid panel  . TSH  . CBC with Differential/Platelet  . Calcium, ionized  . PTH, intact (no Ca)  . Hemoglobin A1c   Meds ordered this encounter  Medications  . triamcinolone cream (KENALOG) 0.1 %    Sig: Apply 1 application topically 2 (two) times daily. For 2 weeks, then as needed    Dispense:  45 g    Refill:  0      Lifestyle: Body mass index is 24.55 kg/m. Wt Readings from Last 3 Encounters:  10/12/18 134 lb 3.2 oz (60.9 kg)  10/06/18 133 lb (60.3 kg)  09/20/18 133 lb (60.3 kg)     Patient Active Problem List   Diagnosis Date Noted  . Age-related osteoporosis without current pathological fracture 07/20/2016    Priority: High    Reports took fosamax x 8 years, then forteo x 6 months.  DEXA 03/2018: osteoporosis, T = -3.8   .  Hyperlipidemia 03/07/2015    Priority: High    Noted 01/23/14   . Impaired fasting glucose 03/07/2015    Priority: High    Noted 01/23/14   . 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    . Hypothyroidism due to Hashimoto's thyroiditis 02/13/2015    Priority: High  . Malignant neoplasm of female breast ([place]) 02/13/2015    Priority: High    Left, 2016; s/p lumpectomy  and rads TX and tamoxifen;    . Anxiety 09/29/2017    Priority: Medium  . Parkinson's disease ([place]) 09/29/2017    Priority: Medium    PD L since 2018, anx, cancer with chemotherapy-induced neuropathy at the hands and feet  2016 B12 and TSH unremarkable   . Dysfunctional voiding of urine 09/13/2016    Priority: Medium  . Female stress incontinence 09/13/2016    Priority: Medium  . 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 Gastroesophageal reflux disease 09/13/2016    Priority: Medium  . 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 Irritable bowel syndrome with both constipation and diarrhea 02/13/2015    Priority: Medium  . Atrophic vaginitis 02/13/2015    Priority: Low  . Recurrent UTI 02/13/2015    Priority: Low  . Vitamin D deficiency disease 02/13/2015    Priority: Low  . Change in bowel habits 08/26/2018  . History of hysterectomy 08/26/2018  . Pelvic floor dysfunction 08/26/2018  . Fecal smearing 08/26/2018  . Fecal urgency 08/26/2018  . RLS (restless legs syndrome) 07/14/2018  . 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 Female bladder prolapse 04/07/2018  . 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 See media section   . Perennial and seasonal allergic rhinitis 02/01/2018  . Allergic conjunctivitis 02/01/2018  . History of nasal polyp 02/01/2018  . History of food allergy 02/01/2018   Health Maintenance  Topic Date Due  . INFLUENZA VACCINE  10/29/2018  . MAMMOGRAM  04/05/2019  . DEXA SCAN  04/04/2020  . PNA vac Low Risk Adult  Completed   Immunization History  Administered Date(s) Administered  . Influenza, High Dose Seasonal PF 01/05/2015  . Influenza,inj,Quad PF,6+ Mos 01/10/2018  . Pneumococcal Conjugate-13 01/16/2014  . Pneumococcal Polysaccharide-23 12/10/2007,  01/29/2015  . Zoster Recombinat (Shingrix) 10/15/2017, 01/12/2018, 02/17/2018   We updated and reviewed the patient's past history in detail and it is documented below. Allergies: Patient is allergic to cefdinir; cholestyramine; ezetimibe; meperidine; nitrofurantoin; statins; and teriparatide. 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). Past Surgical History Patient  has a past surgical history that includes Tonsillectomy; Hemorrhoid surgery; laparoscopy; Bunionectomy; Tubal ligation; Colon surgery; Oophorectomy; Nasal sinus surgery; Adenoidectomy; and Breast lumpectomy (Left). Family History: Patient family history includes Arthritis in her father, mother, and sister; Cancer in her brother, father, sister, sister, and sister; Depression in her father; Diabetes in her mother and sister; Heart disease in her mother; Hyperlipidemia in her brother and sister; Hypertension in her mother; Osteoporosis in her father. Social  History:  Patient  reports that she has never smoked. She has never used smokeless tobacco. She reports current alcohol use. She reports that she does not use drugs.  Review of Systems: Constitutional: negative for fever or malaise Ophthalmic: negative for photophobia, double vision or loss of vision Cardiovascular: negative for chest pain, dyspnea on exertion, or new LE swelling Respiratory: negative for SOB or persistent cough Gastrointestinal: negative for abdominal pain, change in bowel habits or melena, +fecal incontienence Genitourinary: negative for dysuria or gross hematuria, no abnormal uterine bleeding or disharge Musculoskeletal: negative for new gait disturbance or muscular weakness Integumentary: negative for new or persistent rashes, no  breast lumps Neurological: negative for TIA or stroke symptoms Psychiatric: negative for SI or delusions Allergic/Immunologic: negative for hives  Patient Care Team    Relationship Specialty Notifications Start End  Leamon Arnt, MD PCP - General Family Medicine  01/10/18   Tish Men, MD Consulting Physician Oncology  04/12/18   Kathrynn Ducking, MD Consulting Physician Neurology  04/12/18     Objective  Vitals: BP 124/76   Pulse 86   Temp 97.6 F (36.4 C) (Oral)   Resp 14   Ht 5\' 2"  (1.575 m)   Wt 134 lb 3.2 oz (60.9 kg)   SpO2 96%   BMI 24.55 kg/m  General:  Well developed, well nourished, no acute distress  Psych:  Alert and orientedx3,normal mood and affect HEENT:  Normocephalic, atraumatic, non-icteric sclera, PERRL, oropharynx is clear without mass or exudate, supple neck without adenopathy, mass or thyromegaly Cardiovascular:  Normal S1, S2, RRR without gallop, rub or murmur, nondisplaced PMI Respiratory:  Good breath sounds bilaterally, CTAB with normal respiratory effort Gastrointestinal: normal bowel sounds, soft, non-tender, no noted masses. No HSM MSK: no deformities, contusions. Joints are without erythema or swelling. Spine and CVA region are nontender Skin:  Warm, no rashes or suspicious lesions noted Neurologic:    Mental status is normal. CN 2-11 are normal. Gross motor and sensory exams are normal. Normal gait. No tremor  Procedure Note: Paring Skin Callus: foot  Indication: pain  Verbal consent obtained.  Alcohol swab used for cleansing area of callus.  Using an #15 scalpel, two calluses, right and left feet, were bluntly pared down, core was removed. There were no complications. Pt tolerated procedure well and felt improved afterwards.     Commons side effects, risks, benefits, and alternatives for medications and treatment plan prescribed today were discussed, and the patient expressed understanding of the given instructions. Patient is instructed to  call or message via MyChart if he/she has any questions or concerns regarding our treatment plan. No barriers to understanding were identified. We discussed Red Flag symptoms and signs in detail. Patient expressed understanding regarding what to do in case of urgent or emergency type symptoms.   Medication list was reconciled, printed and provided to the patient in AVS. Patient instructions and summary information was reviewed with the patient as documented in the AVS. This note was prepared with assistance of Dragon voice recognition software. Occasional wrong-word or sound-a-like substitutions may have occurred due to the inherent limitations of voice recognition software

## 2018-10-13 ENCOUNTER — Encounter: Payer: Self-pay | Admitting: *Deleted

## 2018-10-13 ENCOUNTER — Encounter: Payer: Self-pay | Admitting: Family Medicine

## 2018-10-13 ENCOUNTER — Telehealth: Payer: Self-pay

## 2018-10-13 DIAGNOSIS — Z789 Other specified health status: Secondary | ICD-10-CM | POA: Insufficient documentation

## 2018-10-13 NOTE — Telephone Encounter (Signed)
Insurance verification for [place] is in process.

## 2018-10-13 NOTE — Telephone Encounter (Signed)
Prolia covered at no out of pocket cost.  Patient's last injection was on 04/26/2018.  Will need next injection after 10/25/2018.

## 2018-10-14 ENCOUNTER — Other Ambulatory Visit: Payer: Self-pay | Admitting: Allergy and Immunology

## 2018-10-18 ENCOUNTER — Ambulatory Visit (INDEPENDENT_AMBULATORY_CARE_PROVIDER_SITE_OTHER): Payer: Medicare HMO

## 2018-10-18 ENCOUNTER — Other Ambulatory Visit: Payer: Self-pay

## 2018-10-18 DIAGNOSIS — J309 Allergic rhinitis, unspecified: Secondary | ICD-10-CM | POA: Diagnosis not present

## 2018-10-19 LAB — CALCIUM, IONIZED: Calcium, Ion: 5.01 mg/dL (ref 4.8–5.6)

## 2018-10-19 LAB — TIQ-MISC

## 2018-10-19 LAB — EXTRA SPECIMEN

## 2018-10-19 LAB — PARATHYROID HORMONE, INTACT (NO CA): PTH: 26 pg/mL (ref 14–64)

## 2018-10-25 ENCOUNTER — Ambulatory Visit (INDEPENDENT_AMBULATORY_CARE_PROVIDER_SITE_OTHER): Payer: Medicare HMO

## 2018-10-25 ENCOUNTER — Other Ambulatory Visit: Payer: Self-pay

## 2018-10-25 DIAGNOSIS — J309 Allergic rhinitis, unspecified: Secondary | ICD-10-CM | POA: Diagnosis not present

## 2018-11-01 ENCOUNTER — Other Ambulatory Visit: Payer: Self-pay

## 2018-11-01 ENCOUNTER — Ambulatory Visit (INDEPENDENT_AMBULATORY_CARE_PROVIDER_SITE_OTHER): Payer: Medicare HMO

## 2018-11-01 DIAGNOSIS — M810 Age-related osteoporosis without current pathological fracture: Secondary | ICD-10-CM | POA: Diagnosis not present

## 2018-11-01 MED ORDER — DENOSUMAB 60 MG/ML ~~LOC~~ SOSY
60.0000 mg | PREFILLED_SYRINGE | Freq: Once | SUBCUTANEOUS | Status: AC
  Administered 2018-11-01: 60 mg via SUBCUTANEOUS

## 2018-11-01 NOTE — Progress Notes (Signed)
Per orders of Dr. Jonni Sanger, injection of Prolia 60 mg/mL given in right arm by Gertie Exon, CMA.  Patient tolerated injection well.  Her next injection will be in 6 months.

## 2018-11-03 ENCOUNTER — Other Ambulatory Visit: Payer: Self-pay

## 2018-11-03 ENCOUNTER — Ambulatory Visit (INDEPENDENT_AMBULATORY_CARE_PROVIDER_SITE_OTHER): Payer: Medicare HMO

## 2018-11-03 DIAGNOSIS — J309 Allergic rhinitis, unspecified: Secondary | ICD-10-CM | POA: Diagnosis not present

## 2018-11-08 ENCOUNTER — Other Ambulatory Visit: Payer: Self-pay

## 2018-11-08 ENCOUNTER — Ambulatory Visit (INDEPENDENT_AMBULATORY_CARE_PROVIDER_SITE_OTHER): Payer: Medicare HMO

## 2018-11-08 DIAGNOSIS — J309 Allergic rhinitis, unspecified: Secondary | ICD-10-CM | POA: Diagnosis not present

## 2018-11-15 ENCOUNTER — Other Ambulatory Visit: Payer: Self-pay

## 2018-11-15 ENCOUNTER — Ambulatory Visit (INDEPENDENT_AMBULATORY_CARE_PROVIDER_SITE_OTHER): Payer: Medicare HMO

## 2018-11-15 DIAGNOSIS — J309 Allergic rhinitis, unspecified: Secondary | ICD-10-CM

## 2018-11-22 ENCOUNTER — Other Ambulatory Visit: Payer: Self-pay

## 2018-11-22 ENCOUNTER — Ambulatory Visit (INDEPENDENT_AMBULATORY_CARE_PROVIDER_SITE_OTHER): Payer: Medicare HMO

## 2018-11-22 DIAGNOSIS — J309 Allergic rhinitis, unspecified: Secondary | ICD-10-CM | POA: Diagnosis not present

## 2018-11-24 ENCOUNTER — Encounter: Payer: Self-pay | Admitting: Family Medicine

## 2018-11-29 ENCOUNTER — Other Ambulatory Visit: Payer: Self-pay

## 2018-11-29 ENCOUNTER — Ambulatory Visit (INDEPENDENT_AMBULATORY_CARE_PROVIDER_SITE_OTHER): Payer: Medicare HMO

## 2018-11-29 DIAGNOSIS — J309 Allergic rhinitis, unspecified: Secondary | ICD-10-CM | POA: Diagnosis not present

## 2018-11-30 ENCOUNTER — Ambulatory Visit: Payer: Medicare HMO | Admitting: Family Medicine

## 2018-12-06 ENCOUNTER — Other Ambulatory Visit: Payer: Self-pay

## 2018-12-06 ENCOUNTER — Ambulatory Visit (INDEPENDENT_AMBULATORY_CARE_PROVIDER_SITE_OTHER): Payer: Medicare HMO

## 2018-12-06 DIAGNOSIS — J309 Allergic rhinitis, unspecified: Secondary | ICD-10-CM | POA: Diagnosis not present

## 2018-12-09 ENCOUNTER — Ambulatory Visit: Payer: Medicare HMO | Admitting: Family Medicine

## 2018-12-13 ENCOUNTER — Other Ambulatory Visit: Payer: Self-pay

## 2018-12-13 ENCOUNTER — Ambulatory Visit (INDEPENDENT_AMBULATORY_CARE_PROVIDER_SITE_OTHER): Payer: Medicare HMO

## 2018-12-13 DIAGNOSIS — J309 Allergic rhinitis, unspecified: Secondary | ICD-10-CM

## 2018-12-14 ENCOUNTER — Ambulatory Visit: Payer: Medicare HMO | Admitting: Family Medicine

## 2018-12-15 ENCOUNTER — Telehealth: Payer: Self-pay | Admitting: Physical Therapy

## 2018-12-15 NOTE — Telephone Encounter (Signed)
Please advise 

## 2018-12-15 NOTE — Telephone Encounter (Signed)
Copied from [place] [id number]. Topic: General - Other >> Dec 15, 2018 12:51 PM Leward Quan A wrote: Reason for CRM: Patient called to inquire from Dr Jonni Sanger if she think it is ok for her and her husband to go to a wedding in [place] that should have about 2 to 45 people. Asking for a call back with an answer please.  Ph# [phone]

## 2018-12-15 NOTE — Telephone Encounter (Signed)
Please let her know that travel via plane and larger crowds still carry risk. She is a high risk patient due to her age.  It is up to her but I would not advise it.

## 2018-12-15 NOTE — Telephone Encounter (Signed)
Pt aware ans verbalized understanding

## 2018-12-16 ENCOUNTER — Other Ambulatory Visit: Payer: Self-pay

## 2018-12-16 ENCOUNTER — Ambulatory Visit: Payer: Medicare HMO | Admitting: Family Medicine

## 2018-12-16 ENCOUNTER — Encounter: Payer: Self-pay | Admitting: Family Medicine

## 2018-12-16 VITALS — BP 126/80 | HR 95 | Temp 98.1°F | Ht 62.0 in | Wt 138.0 lb

## 2018-12-16 DIAGNOSIS — N390 Urinary tract infection, site not specified: Secondary | ICD-10-CM

## 2018-12-16 DIAGNOSIS — Z23 Encounter for immunization: Secondary | ICD-10-CM

## 2018-12-16 MED ORDER — SULFAMETHOXAZOLE-TRIMETHOPRIM 800-160 MG PO TABS: 1.0000 | ORAL_TABLET | Freq: Two times a day (BID) | ORAL | 0 refills | Status: DC

## 2018-12-16 NOTE — Progress Notes (Signed)
Subjective   CC:  Chief Complaint  Patient presents with  . Dysuria    started today     HPI: Julia [name] is a 78 y.o. female who presents to the office today to address the problems listed above in the chief complaint.  Patient reports dysuria and urinary frequency.  She has sensation of increased urinary pressure.  She denies fevers flank pain nausea vomiting or gross hematuria.  Symptoms have been present for several hours.  She denies history of interstitial cystitis.  She denies vaginal symptoms including vaginal discharge or pelvic pain.  H/o recurrent uti: klebsiella and enterococcus in 04/2018 and 12/2017: both sensitive to bactrim.  No recent uti.  Assessment  1. Recurrent UTI   2. Need for immunization against influenza      Plan   Treat with septra and follow up on culture. Routine guidance.   Flu shot today  Follow up: f/u as scheduled.  Orders Placed This Encounter  Procedures  . Urine Culture  . Flu Vaccine QUAD High Dose(Fluad)   Meds ordered this encounter  Medications  . sulfamethoxazole-trimethoprim (BACTRIM DS) 800-160 MG tablet    Sig: Take 1 tablet by mouth 2 (two) times daily.    Dispense:  14 tablet    Refill:  0      I reviewed the patients updated PMH, FH, and SocHx.    Patient Active Problem List   Diagnosis Date Noted  . Statin intolerance 10/13/2018    Priority: High  . Age-related osteoporosis without current pathological fracture 07/20/2016    Priority: High  . Hyperlipidemia 03/07/2015    Priority: High  . Impaired fasting glucose 03/07/2015    Priority: High  . History of colon cancer 02/13/2015    Priority: High  . Hypothyroidism due to Hashimoto's thyroiditis 02/13/2015    Priority: High  . 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 RLS (restless legs syndrome) 07/14/2018    Priority: Medium  . 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 Anxiety 09/29/2017    Priority:  Medium  . Parkinson's disease ([place]) 09/29/2017    Priority: Medium  . Dysfunctional voiding of urine 09/13/2016    Priority: Medium  . Female stress incontinence 09/13/2016    Priority: Medium  . 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 Gastroesophageal reflux disease 09/13/2016    Priority: Medium  . Drug-induced polyneuropathy ([place]) 03/07/2015    Priority: Medium  . Irritable bowel syndrome with both constipation and diarrhea 02/13/2015    Priority: Medium  . Perennial and seasonal allergic rhinitis 02/01/2018    Priority: Low  . Atrophic vaginitis 02/13/2015    Priority: Low  . Recurrent UTI 02/13/2015    Priority: Low  . Vitamin D deficiency disease 02/13/2015    Priority: Low  . Pelvic floor dysfunction 08/26/2018  . Fecal smearing 08/26/2018  . Fecal urgency 08/26/2018  . Female bladder prolapse 04/07/2018  . 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 Calcium Citrate-Vitamin D 315-250 MG-UNIT TABS Take by mouth.  . Carbidopa-Levodopa ER (SINEMET CR) 25-100 MG tablet controlled release Take 1 tablet by mouth 4 (four) times daily. (Patient taking differently: Take 1 tablet by mouth 3 (three) times daily. )  . EPINEPHrine 0.3 mg/0.3 mL IJ SOAJ injection   . Fexofenadine HCl (ALLEGRA ALLERGY PO) Take by mouth.  . fluticasone (  FLONASE) 50 MCG/ACT nasal spray SPRAY TWO SPRAYS IN EACH NOSTRIL ONCE DAILY AS NEEDED  . levothyroxine (SYNTHROID, LEVOTHROID) 88 MCG tablet Take 1 tablet (88 mcg total) by mouth daily before breakfast.  . Multiple Vitamins-Minerals (CENTRUM SILVER PO) Take by mouth.  . Probiotic Product (PROBIOTIC DAILY PO) Take by mouth.  . tamoxifen (NOLVADEX) 20 MG tablet Take 1 tablet (20 mg total) by mouth daily.  Marland Kitchen triamcinolone cream (KENALOG) 0.1 % Apply 1 application topically 2 (two) times daily. For 2 weeks, then as needed    Review of Systems: Cardiovascular: negative for chest pain  Respiratory: negative for SOB or persistent cough Gastrointestinal: negative for abdominal pain Constitutional: Negative for fever malaise or anorexia  Objective  Vitals: BP 126/80   Pulse 95   Temp 98.1 F (36.7 C) (Temporal)   Ht 5\' 2"  (1.575 m)   Wt 138 lb (62.6 kg)   SpO2 100%   BMI 25.24 kg/m  General: no acute distress  Psych:  Alert and oriented, normal mood and affect Cardiovascular:  RRR without murmur or gallop. no peripheral edema Respiratory:  Good breath sounds bilaterally, CTAB with normal respiratory effort Gastrointestinal: soft, flat abdomen, normal active bowel sounds, no palpable masses, NO CVAT, no suprapubic ttp or rebound or guarding Skin:  Warm, no rashes Neurologic:   Mental status is normal    Commons side effects, risks, benefits, and alternatives for medications and treatment plan prescribed today were discussed, and the patient expressed understanding of the given instructions. Patient is instructed to call or message via MyChart if he/she has any questions or concerns regarding our treatment plan. No barriers to understanding were identified. We discussed Red Flag symptoms and signs in detail. Patient expressed understanding regarding what to do in case of urgent or emergency type symptoms.   Medication list was reconciled, printed and provided to the patient in AVS. Patient instructions and summary information was reviewed with the patient as documented in the AVS. This note was prepared with assistance of Dragon voice recognition software. Occasional wrong-word or sound-a-like substitutions may have occurred due to the inherent limitations of voice recognition software

## 2018-12-16 NOTE — Patient Instructions (Signed)
Please follow up as scheduled for your next visit with me: 12/22/2018   If you have any questions or concerns, please don't hesitate to send me a message via MyChart or call the office at [phone]. Thank you for visiting with [place] today! It's our pleasure caring for you.  Start the antibiotic. I will call you Monday if the urine culture shows that something different is needed.

## 2018-12-18 LAB — URINE CULTURE
MICRO NUMBER:: 898050
SPECIMEN QUALITY:: ADEQUATE

## 2018-12-19 DIAGNOSIS — H16223 Keratoconjunctivitis sicca, not specified as Sjogren's, bilateral: Secondary | ICD-10-CM | POA: Diagnosis not present

## 2018-12-19 DIAGNOSIS — D3131 Benign neoplasm of right choroid: Secondary | ICD-10-CM | POA: Diagnosis not present

## 2018-12-19 DIAGNOSIS — Z961 Presence of intraocular lens: Secondary | ICD-10-CM | POA: Diagnosis not present

## 2018-12-19 NOTE — Progress Notes (Signed)
Please call patient: I have reviewed his/her lab results. Urine did show infection; the antibiotic should be clearing her symtpoms. Thanks for checking on her.   pansensitive E.coli.

## 2018-12-20 ENCOUNTER — Other Ambulatory Visit: Payer: Self-pay

## 2018-12-20 ENCOUNTER — Ambulatory Visit: Payer: Medicare HMO

## 2018-12-22 ENCOUNTER — Ambulatory Visit (INDEPENDENT_AMBULATORY_CARE_PROVIDER_SITE_OTHER): Payer: Medicare HMO | Admitting: Family Medicine

## 2018-12-22 ENCOUNTER — Telehealth: Payer: Self-pay | Admitting: Physical Therapy

## 2018-12-22 ENCOUNTER — Other Ambulatory Visit: Payer: Self-pay

## 2018-12-22 ENCOUNTER — Encounter: Payer: Self-pay | Admitting: Family Medicine

## 2018-12-22 VITALS — BP 134/74 | HR 92 | Temp 97.7°F | Resp 14 | Ht 62.0 in | Wt 136.4 lb

## 2018-12-22 DIAGNOSIS — R6881 Early satiety: Secondary | ICD-10-CM | POA: Diagnosis not present

## 2018-12-22 DIAGNOSIS — M810 Age-related osteoporosis without current pathological fracture: Secondary | ICD-10-CM

## 2018-12-22 DIAGNOSIS — N390 Urinary tract infection, site not specified: Secondary | ICD-10-CM

## 2018-12-22 NOTE — Patient Instructions (Addendum)
Please return in February as scheduled for recheck.  05/04/2019  If you have any questions or concerns, please don't hesitate to send me a message via MyChart or call the office at [phone]. Thank you for visiting with [place] today! It's our pleasure caring for you.

## 2018-12-22 NOTE — Progress Notes (Signed)
Subjective  CC:  Chief Complaint  Patient presents with  . Osteoporosis    Reports that for about a week after injection she has sore hips, right side worse    HPI: Julia [name] is a 78 y.o. female who presents to the office today to address the problems listed above in the chief complaint.  Here for f/u on early satiety: see last note. No worse. Gets full quickly w/o pain, dysphagia, reflux, nausea, vomiting. Waits a little bit and then can go back and finish her meal. No weight loss. H/o colon cancer back in early 2000s. Reports has had numerous abd CT scans. No melena. Feels she is doing fine. Lab work was unrevealing.   Fatigued and polyneuropathy is worsening. Related to PD and PN  Osteoporosis: on Prolia: had increased back ache after last injection. Resolved after a few days. No systemic reaction.   UTI on bactrim and now sxs resolved.   No visits with results within 1 Day(s) from this visit.  Latest known visit with results is:  Office Visit on 12/16/2018  Component Date Value Ref Range Status  . MICRO NUMBER: 12/16/2018 [id number]   Final  . SPECIMEN QUALITY: 12/16/2018 Adequate   Final  . Sample Source 12/16/2018 URINE   Final  . STATUS: 12/16/2018 FINAL   Final  . ISOLATE 1: 12/16/2018 Escherichia coli*  Final     Assessment  1. Early satiety   2. Age-related osteoporosis without current pathological fracture   3. Recurrent UTI      Plan   Early satiety:  No worrisome features. Offered imaging but pt declines at this time. Discussed red flag sxs. Will monitor.   Continue prolia; premedicate with advil and monitor.   Recurrent UTI: resolving   Follow up:   05/04/2019  No orders of the defined types were placed in this encounter.  No orders of the defined types were placed in this encounter.     I reviewed the patients updated PMH, FH, and SocHx.    Patient Active Problem List   Diagnosis Date Noted  . Statin intolerance 10/13/2018    Priority: High   . Age-related osteoporosis without current pathological fracture 07/20/2016    Priority: High  . Hyperlipidemia 03/07/2015    Priority: High  . Impaired fasting glucose 03/07/2015    Priority: High  . History of colon cancer 02/13/2015    Priority: High  . Hypothyroidism due to Hashimoto's thyroiditis 02/13/2015    Priority: High  . Malignant neoplasm of female breast ([place]) 02/13/2015    Priority: High  . RLS (restless legs syndrome) 07/14/2018    Priority: Medium  . 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 Anxiety 09/29/2017    Priority: Medium  . Parkinson's disease ([place]) 09/29/2017    Priority: Medium  . Dysfunctional voiding of urine 09/13/2016    Priority: Medium  . Female stress incontinence 09/13/2016    Priority: Medium  . 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 Gastroesophageal reflux disease 09/13/2016    Priority: Medium  . Drug-induced polyneuropathy ([place]) 03/07/2015    Priority: Medium  . Irritable bowel syndrome with both constipation and diarrhea 02/13/2015    Priority: Medium  . Perennial and seasonal allergic rhinitis 02/01/2018    Priority: Low  . Atrophic vaginitis 02/13/2015    Priority: Low  . Recurrent UTI 02/13/2015    Priority: Low  . Vitamin D deficiency disease 02/13/2015  Priority: Low  . Pelvic floor dysfunction 08/26/2018  . Fecal smearing 08/26/2018  . Fecal urgency 08/26/2018  . Female bladder prolapse 04/07/2018  . 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 Calcium Citrate-Vitamin D 315-250 MG-UNIT TABS Take by mouth.  . Carbidopa-Levodopa ER (SINEMET CR) 25-100 MG tablet controlled release Take 1 tablet by mouth 4 (four) times daily. (Patient taking differently: Take 1 tablet by mouth 3 (three) times daily. )  . EPINEPHrine 0.3 mg/0.3 mL IJ SOAJ injection   . Fexofenadine HCl (ALLEGRA ALLERGY PO) Take by mouth.  . fluticasone (FLONASE)  50 MCG/ACT nasal spray SPRAY TWO SPRAYS IN EACH NOSTRIL ONCE DAILY AS NEEDED  . levothyroxine (SYNTHROID, LEVOTHROID) 88 MCG tablet Take 1 tablet (88 mcg total) by mouth daily before breakfast.  . Multiple Vitamins-Minerals (CENTRUM SILVER PO) Take by mouth.  . Probiotic Product (PROBIOTIC DAILY PO) Take by mouth.  . sulfamethoxazole-trimethoprim (BACTRIM DS) 800-160 MG tablet Take 1 tablet by mouth 2 (two) times daily.  . tamoxifen (NOLVADEX) 20 MG tablet Take 1 tablet (20 mg total) by mouth daily.  Marland Kitchen triamcinolone cream (KENALOG) 0.1 % Apply 1 application topically 2 (two) times daily. For 2 weeks, then as needed    Allergies: Patient is allergic to cefdinir; cholestyramine; ezetimibe; meperidine; nitrofurantoin; statins; and teriparatide. Family History: Patient family history includes Arthritis in her father, mother, and sister; Cancer in her brother, father, sister, sister, and sister; Depression in her father; Diabetes in her mother and sister; Heart disease in her mother; Hyperlipidemia in her brother and sister; Hypertension in her mother; Osteoporosis in her father. Social History:  Patient  reports that she has never smoked. She has never used smokeless tobacco. She reports current alcohol use. She reports that she does not use drugs.  Review of Systems: Constitutional: Negative for fever malaise or anorexia Cardiovascular: negative for chest pain Respiratory: negative for SOB or persistent cough Gastrointestinal: negative for abdominal pain  Objective  Vitals: BP 134/74   Pulse 92   Temp 97.7 F (36.5 C) (Tympanic)   Resp 14   Ht 5\' 2"  (1.575 m)   Wt 136 lb 6.4 oz (61.9 kg)   SpO2 98%   BMI 24.95 kg/m  General: no acute distress , A&Ox3, appears well HEENT: PEERL, conjunctiva normal, Oropharynx moist,neck is supple Cardiovascular:  RRR without murmur or gallop.  Respiratory:  Good breath sounds bilaterally, CTAB with normal respiratory effort Skin:  Warm, no rashes  ABD: no abdominal ttp or mass     Commons side effects, risks, benefits, and alternatives for medications and treatment plan prescribed today were discussed, and the patient expressed understanding of the given instructions. Patient is instructed to call or message via MyChart if he/she has any questions or concerns regarding our treatment plan. No barriers to understanding were identified. We discussed Red Flag symptoms and signs in detail. Patient expressed understanding regarding what to do in case of urgent or emergency type symptoms.   Medication list was reconciled, printed and provided to the patient in AVS. Patient instructions and summary information was reviewed with the patient as documented in the AVS. This note was prepared with assistance of Dragon voice recognition software. Occasional wrong-word or sound-a-like substitutions may have occurred due to the inherent limitations of voice recognition software

## 2018-12-22 NOTE — Telephone Encounter (Signed)
Copied from [place] [id number]. Topic: General - Inquiry >> Dec 22, 2018 12:05 PM Sheran Luz wrote: Patient is scheduled for today with Dr. Jonni Sanger. Patient would like to know if she is able to bring in urine specimen from home.

## 2018-12-27 ENCOUNTER — Other Ambulatory Visit: Payer: Self-pay

## 2018-12-27 ENCOUNTER — Ambulatory Visit (INDEPENDENT_AMBULATORY_CARE_PROVIDER_SITE_OTHER): Payer: Medicare HMO

## 2018-12-27 DIAGNOSIS — J309 Allergic rhinitis, unspecified: Secondary | ICD-10-CM | POA: Diagnosis not present

## 2018-12-28 ENCOUNTER — Encounter: Payer: Self-pay | Admitting: Neurology

## 2018-12-28 ENCOUNTER — Ambulatory Visit: Payer: Medicare HMO | Admitting: Neurology

## 2018-12-28 ENCOUNTER — Encounter

## 2018-12-28 VITALS — BP 141/83 | HR 90 | Temp 95.9°F | Ht 64.5 in | Wt 137.0 lb

## 2018-12-28 DIAGNOSIS — E538 Deficiency of other specified B group vitamins: Secondary | ICD-10-CM

## 2018-12-28 DIAGNOSIS — G609 Hereditary and idiopathic neuropathy, unspecified: Secondary | ICD-10-CM | POA: Diagnosis not present

## 2018-12-28 DIAGNOSIS — G20 Parkinson's disease: Secondary | ICD-10-CM

## 2018-12-28 DIAGNOSIS — R413 Other amnesia: Secondary | ICD-10-CM | POA: Diagnosis not present

## 2018-12-28 MED ORDER — GABAPENTIN 100 MG PO CAPS: 100.0000 mg | ORAL_CAPSULE | Freq: Every day | ORAL | 1 refills | Status: DC

## 2018-12-28 NOTE — Patient Instructions (Signed)
Begin gabapentin 100 mg at night, call for dose adjustments.   Neurontin (gabapentin) may result in drowsiness, ankle swelling, gait instability, or possibly dizziness. Please contact our office if significant side effects occur with this medication.

## 2018-12-28 NOTE — Progress Notes (Signed)
Reason for visit: Parkinson's disease, memory disturbance  Julia [name] is an 78 y.o. female  History of present illness:  Julia [name] is a 78 year old right-handed white female with a history of Parkinson's disease.  The patient has noted that when she walks she tends to flex the left arm and has decreased arm swing.  She does report micrographia with handwriting, she is right-handed.  She denies any falls, her balance is slightly off however.  She tries to stay active and walk, but she no longer has access to the gym.  She is not always sleeping well, she has vivid dreams at night, she may have fatigue during the day.  She also has noted some problems with memory, she feels more forgetful.  She is having a lot of anxiety issues with driving, she is not able to drive much around town.  She may have a raspy voice at times.  She denies issues with swallowing.  She has cold sensations in the hands and feet, she has some discomfort in the feet and feeling of a pad being on the bottom of the feet.  This does not allow her to sleep well at night adding to the problem with fatigue during the day.  She still has some nausea associated with the Sinemet dosing even after converting to the CR tablet.  Past Medical History:  Diagnosis Date  . Arthritis   . Breast cancer ([place])   . Colon cancer ([place])    previous colon cancer  . Hyperlipidemia   . 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 RLS (restless legs syndrome) 07/14/2018  . 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 ADENOIDECTOMY    . BREAST LUMPECTOMY Left    radiation only  . BUNIONECTOMY    . COLON SURGERY    . HEMORRHOID SURGERY    . LAPAROSCOPY    . NASAL SINUS SURGERY    . OOPHORECTOMY    . TONSILLECTOMY    . TUBAL LIGATION      Family History  Problem Relation Age of Onset  . Arthritis Mother   . Diabetes Mother   . Heart disease Mother   . Hypertension  Mother   . Arthritis Father   . Cancer Father   . Depression Father   . Osteoporosis Father   . Arthritis Sister   . Cancer Sister   . Diabetes Sister   . Cancer Brother   . Cancer Sister   . Cancer Sister   . Hyperlipidemia Sister   . Hyperlipidemia Brother     Social history:  reports that she has never smoked. She has never used smokeless tobacco. She reports current alcohol use. She reports that she does not use drugs.    Allergies  Allergen Reactions  . Cefdinir   . Cholestyramine     GERD  . Contrast Media [Iodinated Diagnostic Agents] Diarrhea  . Ezetimibe   . Meperidine   . Nitrofurantoin Diarrhea  . Statins   . Teriparatide     Medications:  Prior to Admission medications   Medication Sig Start Date End Date Taking? Authorizing Provider  Calcium Citrate-Vitamin D 315-250 MG-UNIT TABS Take by mouth.   Yes [provider]  Carbidopa-Levodopa ER (SINEMET CR) 25-100 MG tablet controlled release Take 1 tablet by mouth 4 (four) times daily. Patient taking differently: Take 1 tablet by mouth 3 (three) times daily.  07/14/18  Yes Jannifer Franklin,  Elon Alas, MD  denosumab (PROLIA) 60 MG/ML SOSY injection Inject 60 mg into the skin every 6 (six) months.   Yes [provider]  EPINEPHrine 0.3 mg/0.3 mL IJ SOAJ injection  02/01/18  Yes [provider]  Fexofenadine HCl (ALLEGRA ALLERGY PO) Take by mouth.   Yes [provider]  fluticasone (FLONASE) 50 MCG/ACT nasal spray SPRAY TWO SPRAYS IN EACH NOSTRIL ONCE DAILY AS NEEDED 10/14/18  Yes Bobbitt, Sed[place] Muta, MD  levothyroxine (SYNTHROID, LEVOTHROID) 88 MCG tablet Take 1 tablet (88 mcg total) by mouth daily before breakfast. 07/12/18  Yes Leamon Arnt, MD  Multiple Vitamins-Minerals (CENTRUM SILVER PO) Take by mouth.   Yes [provider]  Probiotic Product (PROBIOTIC DAILY PO) Take by mouth.   Yes [provider]  sulfamethoxazole-trimethoprim (BACTRIM DS) 800-160 MG tablet Take 1  tablet by mouth 2 (two) times daily. 12/16/18  Yes Leamon Arnt, MD  tamoxifen (NOLVADEX) 20 MG tablet Take 1 tablet (20 mg total) by mouth daily. 10/06/18 01/04/19 Yes Tish Men, MD  triamcinolone cream (KENALOG) 0.1 % Apply 1 application topically 2 (two) times daily. For 2 weeks, then as needed 10/12/18  Yes Leamon Arnt, MD  UNABLE TO FIND Med Name: Allergy shots 1/week   Yes [provider]    ROS:  Out of a complete 14 system review of symptoms, the patient complains only of the following symptoms, and all other reviewed systems are negative.  Memory problems Sleeping difficulty, vivid dreams Tremor  Blood pressure (!) 141/83, pulse 90, temperature (!) 95.9 F (35.5 C), temperature source Temporal, height 5' 4.5" (1.638 m), weight 137 lb (62.1 kg).  Physical Exam  General: The patient is alert and cooperative at the time of the examination.  Skin: No significant peripheral edema is noted.   Neurologic Exam  Mental status: The patient is alert and oriented x 3 at the time of the examination. The patient has apparent normal recent and remote memory, with an apparently normal attention span and concentration ability.  Mini-Mental status examination done today shows a total score 30/30.   Cranial nerves: Facial symmetry is present. Speech is normal, no aphasia or dysarthria is noted. Extraocular movements are full. Visual fields are full.  Mild masking the face is seen.  Motor: The patient has good strength in all 4 extremities.  Sensory examination: Soft touch sensation is symmetric on the face, arms, and legs.  Coordination: The patient has good finger-nose-finger and heel-to-shin bilaterally.  Gait and station: The patient is able to arise from a seated position with arms crossed.  Once up, she is able to walk quite well, she has good stride and good turns.  She does have the left arm in slight flexion, slightly decreased arm swing on the left.  She is able to  perform tandem gait.  Romberg is negative.  Reflexes: Deep tendon reflexes are symmetric.   Assessment/Plan:  1.  Parkinson's disease  2.  Mild memory disturbance  3.  Foot discomfort, possible peripheral neuropathy  The patient is not sleeping well at night in part because of what sounds like neuropathy symptoms.  Blood work will be done in this regard, the patient however has received chemotherapy previously.  She has cold sensations in the hands and feet.  She will be given gabapentin 100 mg at night, she will call for any dose adjustments.  She will continue the current dose of Sinemet for now, we will follow the memory issues over time.  She is still having some nausea from the Sinemet.  She is to remain as active as possible. If the sensory symptoms in the feet appear to be progressing, EMG and NCV will be done.  Jill Alexanders MD 12/28/2018 10:36 AM  [facility] [address] [place], [place] [address]  Phone [phone] Fax [phone]

## 2018-12-30 LAB — RPR: RPR Ser Ql: NONREACTIVE

## 2018-12-30 LAB — MULTIPLE MYELOMA PANEL, SERUM
Albumin SerPl Elph-Mcnc: 3.8 g/dL (ref 2.9–4.4)
Albumin/Glob SerPl: 1.6 (ref 0.7–1.7)
Alpha 1: 0.3 g/dL (ref 0.0–0.4)
Alpha2 Glob SerPl Elph-Mcnc: 0.6 g/dL (ref 0.4–1.0)
B-Globulin SerPl Elph-Mcnc: 0.7 g/dL (ref 0.7–1.3)
Gamma Glob SerPl Elph-Mcnc: 0.9 g/dL (ref 0.4–1.8)
Globulin, Total: 2.5 g/dL (ref 2.2–3.9)
IgA/Immunoglobulin A, Serum: 86 mg/dL (ref 64–422)
IgG (Immunoglobin G), Serum: 921 mg/dL (ref 586–1602)
IgM (Immunoglobulin M), Srm: 62 mg/dL (ref 26–217)
Total Protein: 6.3 g/dL (ref 6.0–8.5)

## 2018-12-30 LAB — VITAMIN B12: Vitamin B-12: 830 pg/mL (ref 232–1245)

## 2018-12-30 LAB — SEDIMENTATION RATE: Sed Rate: 2 mm/hr (ref 0–40)

## 2018-12-30 LAB — B. BURGDORFI ANTIBODIES: Lyme IgG/IgM Ab: <0.91 {ISR} (ref 0.00–0.90)

## 2018-12-30 LAB — ANA W/REFLEX: Anti Nuclear Antibody (ANA): NEGATIVE

## 2018-12-30 LAB — ANGIOTENSIN CONVERTING ENZYME: Angio Convert Enzyme: 29 U/L (ref 14–82)

## 2019-01-03 ENCOUNTER — Other Ambulatory Visit: Payer: Self-pay

## 2019-01-03 ENCOUNTER — Ambulatory Visit (INDEPENDENT_AMBULATORY_CARE_PROVIDER_SITE_OTHER): Payer: Medicare HMO

## 2019-01-03 DIAGNOSIS — J309 Allergic rhinitis, unspecified: Secondary | ICD-10-CM | POA: Diagnosis not present

## 2019-01-05 ENCOUNTER — Other Ambulatory Visit: Payer: Self-pay | Admitting: Neurology

## 2019-01-10 ENCOUNTER — Other Ambulatory Visit: Payer: Self-pay

## 2019-01-10 ENCOUNTER — Ambulatory Visit (INDEPENDENT_AMBULATORY_CARE_PROVIDER_SITE_OTHER): Payer: Medicare HMO

## 2019-01-10 DIAGNOSIS — J309 Allergic rhinitis, unspecified: Secondary | ICD-10-CM

## 2019-01-15 ENCOUNTER — Other Ambulatory Visit: Payer: Self-pay | Admitting: Family Medicine

## 2019-01-17 ENCOUNTER — Ambulatory Visit (INDEPENDENT_AMBULATORY_CARE_PROVIDER_SITE_OTHER): Payer: Medicare HMO

## 2019-01-17 ENCOUNTER — Other Ambulatory Visit: Payer: Self-pay

## 2019-01-17 DIAGNOSIS — J309 Allergic rhinitis, unspecified: Secondary | ICD-10-CM

## 2019-01-24 ENCOUNTER — Ambulatory Visit (INDEPENDENT_AMBULATORY_CARE_PROVIDER_SITE_OTHER): Payer: Medicare HMO

## 2019-01-24 ENCOUNTER — Other Ambulatory Visit: Payer: Self-pay

## 2019-01-24 DIAGNOSIS — J309 Allergic rhinitis, unspecified: Secondary | ICD-10-CM | POA: Diagnosis not present

## 2019-01-27 ENCOUNTER — Other Ambulatory Visit: Payer: Self-pay

## 2019-01-27 ENCOUNTER — Ambulatory Visit (INDEPENDENT_AMBULATORY_CARE_PROVIDER_SITE_OTHER): Payer: Medicare HMO

## 2019-01-27 DIAGNOSIS — Z0000 Encounter for general adult medical examination without abnormal findings: Secondary | ICD-10-CM | POA: Diagnosis not present

## 2019-01-27 NOTE — Progress Notes (Signed)
I connected with Julia [name] on 01/27/19 at 1430 by phone and verified that I am speaking with the correct person using two identifiers. Location patient: Home Location provider: [facility], Office Persons participating in the virtual visit: Denman George LPN and Dr. Dimas Chyle    I discussed the limitations of evaluation and management by telemedicine and the availability of in person appointments. The patient expressed understanding and agreed to proceed.  Subjective:   Julia [name] is a 78 y.o. female who presents for Medicare Annual (Subsequent) preventive examination.  Review of Systems:   Cardiac Risk Factors include: advanced age (>20men, >33 women)     Objective:     Vitals: There were no vitals taken for this visit.  There is no height or weight on file to calculate BMI.  Advanced Directives 01/27/2019  Does Patient Have a Medical Advance Directive? Yes  Type of Paramedic of [place];Living will  Does patient want to make changes to medical advance directive? No - Patient declined  Copy of [place] in Chart? No - copy requested    Tobacco Social History   Tobacco Use  Smoking Status Never Smoker  Smokeless Tobacco Never Used     Counseling given: Not Answered   Clinical Intake:  Pre-visit preparation completed: Yes  Pain : No/denies pain  Diabetes: No  How often do you need to have someone help you when you read instructions, pamphlets, or other written materials from your doctor or pharmacy?: 1 - Never  Interpreter Needed?: No  Information entered by :: Denman George LPN  Past Medical History:  Diagnosis Date  . Arthritis   . Breast cancer ([place])   . Colon cancer ([place])    previous colon cancer  . Hyperlipidemia   . 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 RLS (restless legs syndrome) 07/14/2018  . 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 ADENOIDECTOMY    . BREAST LUMPECTOMY Left    radiation only  . BUNIONECTOMY    . COLON SURGERY    . HEMORRHOID SURGERY    . LAPAROSCOPY    . NASAL SINUS SURGERY    . OOPHORECTOMY    . TONSILLECTOMY    . TUBAL LIGATION     Family History  Problem Relation Age of Onset  . Arthritis Mother   . Diabetes Mother   . Heart disease Mother   . Hypertension Mother   . Arthritis Father   . Cancer Father   . Depression Father   . Osteoporosis Father   . Arthritis Sister   . Cancer Sister   . Diabetes Sister   . Cancer Brother   . Cancer Sister   . Cancer Sister   . Hyperlipidemia Sister   . Hyperlipidemia Brother    Social History   Socioeconomic History  . Marital status: Married    Spouse name: Not on file  . Number of children: Not on file  . Years of education: 3  . Highest education level: Master's degree (e.g., MA, MS, MEng, MEd, MSW, MBA)  Occupational History  . Occupation: Retired Management consultant  . Financial resource strain: Not on file  . Food insecurity    Worry: Not on file    Inability: Not on file  . Transportation needs    Medical: Not on file    Non-medical: Not on file  Tobacco Use  .  Smoking status: Never Smoker  . Smokeless tobacco: Never Used  Substance and Sexual Activity  . Alcohol use: Yes    Comment: occ  . Drug use: Never  . Sexual activity: Not on file  Lifestyle  . Physical activity    Days per week: Not on file    Minutes per session: Not on file  . Stress: Not on file  Relationships  . Social Herbalist on phone: Not on file    Gets together: Not on file    Attends religious service: Not on file    Active member of club or organization: Not on file    Attends meetings of clubs or organizations: Not on file    Relationship status: Not on file  Other Topics Concern  . Not on file  Social History Narrative   Right handed    Lives with husband    Caffeine 1 cup daily     Outpatient  Encounter Medications as of 01/27/2019  Medication Sig  . tamoxifen (NOLVADEX) 20 MG tablet Take 20 mg by mouth daily.  . Calcium Citrate-Vitamin D 315-250 MG-UNIT TABS Take by mouth.  . Carbidopa-Levodopa ER (SINEMET CR) 25-100 MG tablet controlled release TAKE 1 TABLET BY MOUTH FOUR TIMES A DAY  . denosumab (PROLIA) 60 MG/ML SOSY injection Inject 60 mg into the skin every 6 (six) months.  . EPINEPHrine 0.3 mg/0.3 mL IJ SOAJ injection   . Fexofenadine HCl (ALLEGRA ALLERGY PO) Take by mouth.  . fluticasone (FLONASE) 50 MCG/ACT nasal spray SPRAY TWO SPRAYS IN EACH NOSTRIL ONCE DAILY AS NEEDED  . gabapentin (NEURONTIN) 100 MG capsule Take 1 capsule (100 mg total) by mouth at bedtime.  Marland Kitchen levothyroxine (SYNTHROID) 88 MCG tablet TAKE ONE TABLET BY MOUTH DAILY BEFORE BREAKFAST  . Multiple Vitamins-Minerals (CENTRUM SILVER PO) Take by mouth.  . Probiotic Product (PROBIOTIC DAILY PO) Take by mouth.  . sulfamethoxazole-trimethoprim (BACTRIM DS) 800-160 MG tablet Take 1 tablet by mouth 2 (two) times daily.  Marland Kitchen triamcinolone cream (KENALOG) 0.1 % Apply 1 application topically 2 (two) times daily. For 2 weeks, then as needed  . UNABLE TO FIND Med Name: Allergy shots 1/week   No facility-administered encounter medications on file as of 01/27/2019.     Activities of Daily Living In your present state of health, do you have any difficulty performing the following activities: 01/27/2019 10/12/2018  Hearing? N N  Vision? N Y  Difficulty concentrating or making decisions? N N  Walking or climbing stairs? Y N  Dressing or bathing? N N  Doing errands, shopping? N N  Preparing Food and eating ? N -  Using the Toilet? N -  In the past six months, have you accidently leaked urine? N -  Do you have problems with loss of bowel control? N -  Managing your Medications? N -  Managing your Finances? N -  Housekeeping or managing your Housekeeping? N -  Some recent data might be hidden    Patient Care Team:  Leamon Arnt, MD as PCP - General (Family Medicine) Tish Men, MD as Consulting Physician (Oncology) Kathrynn Ducking, MD as Consulting Physician (Neurology) Bobbitt, [place] Muta, MD as Consulting Physician (Allergy and Immunology) Mansouraty, Telford Nab., MD as Consulting Physician (Gastroenterology)    Assessment:   This is a routine wellness examination for [facility].  Exercise Activities and Dietary recommendations Current Exercise Habits: The patient does not participate in regular exercise at present  Goals   None  Fall Risk Fall Risk  01/27/2019 10/12/2018 06/15/2018 01/10/2018  Falls in the past year? 0 0 0 No  Number falls in past yr: - 0 0 -  Injury with Fall? 0 0 0 -  Follow up Falls evaluation completed;Education provided;Falls prevention discussed Falls evaluation completed Falls evaluation completed -   Is the patient's home free of loose throw rugs in walkways, pet beds, electrical cords, etc?   yes      Grab bars in the bathroom? yes      Handrails on the stairs?   yes      Adequate lighting?   yes   Depression Screen PHQ 2/9 Scores 01/27/2019 10/12/2018 01/10/2018  PHQ - 2 Score 0 0 0     Cognitive Function- no cognitive concerns at this time  MMSE - Mini Mental State Exam 12/28/2018  Orientation to time 5  Orientation to Place 5  Registration 3  Attention/ Calculation 5  Recall 3  Language- name 2 objects 2  Language- repeat 1  Language- follow 3 step command 3  Language- read & follow direction 1  Write a sentence 1  Copy design 1  Total score 30     6CIT Screen 01/27/2019  What Year? 0 points  What month? 0 points  What time? 0 points  Count back from 20 0 points  Months in reverse 0 points  Repeat phrase 0 points  Total Score 0    Immunization History  Administered Date(s) Administered  . Fluad Quad(high Dose 65+) 12/16/2018  . Influenza, High Dose Seasonal PF 01/05/2015  . Influenza,inj,Quad PF,6+ Mos 01/10/2018  . Pneumococcal  Conjugate-13 01/16/2014  . Pneumococcal Polysaccharide-23 12/10/2007, 01/29/2015  . Zoster Recombinat (Shingrix) 10/15/2017, 01/12/2018, 02/17/2018    Qualifies for Shingles Vaccine?Shingrix completed   Screening Tests Health Maintenance  Topic Date Due  . MAMMOGRAM  04/05/2019  . DEXA SCAN  04/04/2020  . INFLUENZA VACCINE  Completed  . PNA vac Low Risk Adult  Completed    Cancer Screenings: Lung: Low Dose CT Chest recommended if Age 34-80 years, 30 pack-year currently smoking OR have quit w/in 15years. Patient does not qualify. Breast:  Up to date on Mammogram? Yes   Up to date of Bone Density/Dexa? Yes Colorectal: colonoscopy last 2018; no longer indicated     Plan:  I have personally reviewed and addressed the Medicare Annual Wellness questionnaire and have noted the following in the patient's chart:  A. Medical and social history B. Use of alcohol, tobacco or illicit drugs  C. Current medications and supplements D. Functional ability and status E.  Nutritional status F.  Physical activity G. Advance directives H. List of other physicians I.  Hospitalizations, surgeries, and ER visits in previous 12 months J.  [place] such as hearing and vision if needed, cognitive and depression L. Referrals, records requested, and appointments- none   In addition, I have reviewed and discussed with patient certain preventive protocols, quality metrics, and best practice recommendations. A written personalized care plan for preventive services as well as general preventive health recommendations were provided to patient.   Signed,  Denman George, LPN  Nurse Health Advisor   Nurse Notes: Patient with concerns that Prolia is causing hip pain.  She would like to discuss stopping medication.  She also is complaining of vaginal discharge similar to a yeast infection that she attributes to Tamoxifen.  States that she discussed this with oncologist and nothing was changed. She  would like to know what  protocol or process should be to change oncologists due to current driving distance.  Please advise.

## 2019-01-27 NOTE — Progress Notes (Signed)
I have personally reviewed the Medicare Annual Wellness Visit and agree with the assessment and plan.  Signing today in place of PCP who is out of office. Will forward to them for patient's additional concerns. Patient will likely need office visit.   Algis Greenhouse. Jerline Pain, MD 01/27/2019 4:16 PM

## 2019-01-27 NOTE — Patient Instructions (Addendum)
Julia [name] , Thank you for taking time to come for your Medicare Wellness Visit. I appreciate your ongoing commitment to your health goals. Please review the following plan we discussed and let me know if I can assist you in the future.   Screening recommendations/referrals: Colorectal Screening: colonoscopy 03/30/16 Mammogram: up to date; last 04/04/18 Bone Density: up to date; last 04/04/18  Vision and Dental Exams: Recommended annual ophthalmology exams for early detection of glaucoma and other disorders of the eye Recommended annual dental exams for proper oral hygiene  Vaccinations: Influenza vaccine: completed 12/16/18 Pneumococcal vaccine: up to date; last 01/29/15 Tdap vaccine: recommended every 10 years; Please call your insurance company to determine your out of pocket expense. You may receive this vaccine at your local pharmacy or Health Dept. Shingles vaccine: Shingrix completed   Advanced directives: Please bring a copy of your POA (Power of Attorney) and/or Living Will to your next appointment.  Goals: Recommend to drink at least 6-8 8oz glasses of water per day and consume a balanced diet rich in fresh fruits and vegetables.   Next appointment: Please schedule your Annual Wellness Visit with your Nurse Health Advisor in one year.   Covid Testing Sites Information: [email or web address]  Preventive Care 65 Years and Older, Female Preventive care refers to lifestyle choices and visits with your health care provider that can promote health and wellness. What does preventive care include?  A yearly physical exam. This is also called an annual well check.  Dental exams once or twice a year.  Routine eye exams. Ask your health care provider how often you should have your eyes checked.  Personal lifestyle choices, including:  Daily care of your teeth and gums.  Regular physical activity.  Eating a healthy diet.  Avoiding  tobacco and drug use.  Limiting alcohol use.  Practicing safe sex.  Taking low-dose aspirin every day if recommended by your health care provider.  Taking vitamin and mineral supplements as recommended by your health care provider. What happens during an annual well check? The services and screenings done by your health care provider during your annual well check will depend on your age, overall health, lifestyle risk factors, and family history of disease. Counseling  Your health care provider may ask you questions about your:  Alcohol use.  Tobacco use.  Drug use.  Emotional well-being.  Home and relationship well-being.  Sexual activity.  Eating habits.  History of falls.  Memory and ability to understand (cognition).  Work and work Statistician.  Reproductive health. Screening  You may have the following tests or measurements:  Height, weight, and BMI.  Blood pressure.  Lipid and cholesterol levels. These may be checked every 5 years, or more frequently if you are over 90 years old.  Skin check.  Lung cancer screening. You may have this screening every year starting at age 15 if you have a 30-pack-year history of smoking and currently smoke or have quit within the past 15 years.  Fecal occult blood test (FOBT) of the stool. You may have this test every year starting at age 27.  Flexible sigmoidoscopy or colonoscopy. You may have a sigmoidoscopy every 5 years or a colonoscopy every 10 years starting at age 31.  Hepatitis C blood test.  Hepatitis B blood test.  Sexually transmitted disease (STD) testing.  Diabetes screening. This is done by checking your blood sugar (glucose) after you have not eaten for a while (fasting). You may have this done every 1-3 years.  Bone density scan. This is done to screen for osteoporosis. You may have this done starting at age 36.  Mammogram. This may be done every 1-2 years. Talk to your health care provider about how  often you should have regular mammograms. Talk with your health care provider about your test results, treatment options, and if necessary, the need for more tests. Vaccines  Your health care provider may recommend certain vaccines, such as:  Influenza vaccine. This is recommended every year.  Tetanus, diphtheria, and acellular pertussis (Tdap, Td) vaccine. You may need a Td booster every 10 years.  Zoster vaccine. You may need this after age 60.  Pneumococcal 13-valent conjugate (PCV13) vaccine. One dose is recommended after age 42.  Pneumococcal polysaccharide (PPSV23) vaccine. One dose is recommended after age 61. Talk to your health care provider about which screenings and vaccines you need and how often you need them. This information is not intended to replace advice given to you by your health care provider. Make sure you discuss any questions you have with your health care provider. Document Released: 04/12/2015 Document Revised: 12/04/2015 Document Reviewed: 01/15/2015 Elsevier Interactive Patient Education  2017 [place] Prevention in the Home Falls can cause injuries. They can happen to people of all ages. There are many things you can do to make your home safe and to help prevent falls. What can I do on the outside of my home?  Regularly fix the edges of walkways and driveways and fix any cracks.  Remove anything that might make you trip as you walk through a door, such as a raised step or threshold.  Trim any bushes or trees on the path to your home.  Use bright outdoor lighting.  Clear any walking paths of anything that might make someone trip, such as rocks or tools.  Regularly check to see if handrails are loose or broken. Make sure that both sides of any steps have handrails.  Any raised decks and porches should have guardrails on the edges.  Have any leaves, snow, or ice cleared regularly.  Use sand or salt on walking paths during winter.  Clean  up any spills in your garage right away. This includes oil or grease spills. What can I do in the bathroom?  Use night lights.  Install grab bars by the toilet and in the tub and shower. Do not use towel bars as grab bars.  Use non-skid mats or decals in the tub or shower.  If you need to sit down in the shower, use a plastic, non-slip stool.  Keep the floor dry. Clean up any water that spills on the floor as soon as it happens.  Remove soap buildup in the tub or shower regularly.  Attach bath mats securely with double-sided non-slip rug tape.  Do not have throw rugs and other things on the floor that can make you trip. What can I do in the bedroom?  Use night lights.  Make sure that you have a light by your bed that is easy to reach.  Do not use any sheets or blankets that are too big for your bed. They should not hang down onto the floor.  Have a firm chair that has side arms. You can use this for support while you get dressed.  Do not have throw rugs and other things on the floor that can make you trip. What can I do in the kitchen?  Clean up any spills right away.  Avoid walking  on wet floors.  Keep items that you use a lot in easy-to-reach places.  If you need to reach something above you, use a strong step stool that has a grab bar.  Keep electrical cords out of the way.  Do not use floor polish or wax that makes floors slippery. If you must use wax, use non-skid floor wax.  Do not have throw rugs and other things on the floor that can make you trip. What can I do with my stairs?  Do not leave any items on the stairs.  Make sure that there are handrails on both sides of the stairs and use them. Fix handrails that are broken or loose. Make sure that handrails are as long as the stairways.  Check any carpeting to make sure that it is firmly attached to the stairs. Fix any carpet that is loose or worn.  Avoid having throw rugs at the top or bottom of the stairs.  If you do have throw rugs, attach them to the floor with carpet tape.  Make sure that you have a light switch at the top of the stairs and the bottom of the stairs. If you do not have them, ask someone to add them for you. What else can I do to help prevent falls?  Wear shoes that:  Do not have high heels.  Have rubber bottoms.  Are comfortable and fit you well.  Are closed at the toe. Do not wear sandals.  If you use a stepladder:  Make sure that it is fully opened. Do not climb a closed stepladder.  Make sure that both sides of the stepladder are locked into place.  Ask someone to hold it for you, if possible.  Clearly mark and make sure that you can see:  Any grab bars or handrails.  First and last steps.  Where the edge of each step is.  Use tools that help you move around (mobility aids) if they are needed. These include:  Canes.  Walkers.  Scooters.  Crutches.  Turn on the lights when you go into a dark area. Replace any light bulbs as soon as they burn out.  Set up your furniture so you have a clear path. Avoid moving your furniture around.  If any of your floors are uneven, fix them.  If there are any pets around you, be aware of where they are.  Review your medicines with your doctor. Some medicines can make you feel dizzy. This can increase your chance of falling. Ask your doctor what other things that you can do to help prevent falls. This information is not intended to replace advice given to you by your health care provider. Make sure you discuss any questions you have with your health care provider. Document Released: 01/10/2009 Document Revised: 08/22/2015 Document Reviewed: 04/20/2014 Elsevier Interactive Patient Education  2017 [employer].

## 2019-01-30 NOTE — Progress Notes (Signed)
Needs office visit to assess vaginal discharge; may try monistat first IF she feels it is a yeast infection.  Need more information about request to change oncologist. Can discuss at ov as well.

## 2019-01-31 ENCOUNTER — Telehealth: Payer: Self-pay | Admitting: *Deleted

## 2019-01-31 NOTE — Telephone Encounter (Signed)
Called pt per Dr. Jonni Sanger request to make OV for vaginal discharge & hip pain.  She is aware to come 11/5 at 9:50 for 10am visit.. I blocked that time, please remove same day and add pt for 10am visit.

## 2019-02-02 ENCOUNTER — Ambulatory Visit (INDEPENDENT_AMBULATORY_CARE_PROVIDER_SITE_OTHER): Payer: Medicare HMO

## 2019-02-02 ENCOUNTER — Other Ambulatory Visit (HOSPITAL_COMMUNITY)
Admission: RE | Admit: 2019-02-02 | Discharge: 2019-02-02 | Disposition: A | Discharge status: Home / Self Care | Payer: Medicare HMO | Source: Ambulatory Visit | Attending: Family Medicine | Admitting: Family Medicine

## 2019-02-02 ENCOUNTER — Other Ambulatory Visit: Payer: Self-pay

## 2019-02-02 ENCOUNTER — Encounter: Payer: Self-pay | Admitting: Family Medicine

## 2019-02-02 ENCOUNTER — Ambulatory Visit: Payer: Medicare HMO | Admitting: Family Medicine

## 2019-02-02 VITALS — BP 118/74 | HR 92 | Temp 98.9°F | Ht 64.5 in | Wt 138.2 lb

## 2019-02-02 DIAGNOSIS — G2581 Restless legs syndrome: Secondary | ICD-10-CM | POA: Diagnosis not present

## 2019-02-02 DIAGNOSIS — T887XXA Unspecified adverse effect of drug or medicament, initial encounter: Secondary | ICD-10-CM

## 2019-02-02 DIAGNOSIS — N898 Other specified noninflammatory disorders of vagina: Secondary | ICD-10-CM | POA: Diagnosis not present

## 2019-02-02 DIAGNOSIS — Z85038 Personal history of other malignant neoplasm of large intestine: Secondary | ICD-10-CM

## 2019-02-02 DIAGNOSIS — J309 Allergic rhinitis, unspecified: Secondary | ICD-10-CM

## 2019-02-02 DIAGNOSIS — G620 Drug-induced polyneuropathy: Secondary | ICD-10-CM | POA: Diagnosis not present

## 2019-02-02 DIAGNOSIS — N398 Other specified disorders of urinary system: Secondary | ICD-10-CM

## 2019-02-02 DIAGNOSIS — C50912 Malignant neoplasm of unspecified site of left female breast: Secondary | ICD-10-CM

## 2019-02-02 DIAGNOSIS — M7062 Trochanteric bursitis, left hip: Secondary | ICD-10-CM

## 2019-02-02 NOTE — Patient Instructions (Signed)
Please return in January for a nurse visit for your next due Prolia injection.  Please return to see me in 3 months for recheck. Sooner if you need more help with your hip pain.  I have referred you to an oncologist in [place].  Continue your tamoxifen for now.   We can consider gabapentin for your neuropathy; if you have nightmares, let me know and I will try to get you Lyrica that should help.   If you have any questions or concerns, please don't hesitate to send me a message via MyChart or call the office at [phone]. Thank you for visiting with [place] today! It's our pleasure caring for you.   Hip Bursitis  Hip bursitis is inflammation of a fluid-filled sac (bursa) in the hip joint. The bursa prevents the bones in the hip joint from rubbing against each other. Hip bursitis can cause mild to moderate pain, and symptoms often come and go over time. What are the causes? This condition may be caused by:  Injury to the hip.  Overuse of the muscles that surround the hip joint.  Previous injury or surgery of the hip.  Arthritis or gout.  Diabetes.  Thyroid disease.  Infection. In some cases, the cause may not be known. What are the signs or symptoms? Symptoms of this condition include:  Mild or moderate pain in the hip area. Pain may get worse with movement.  Tenderness and swelling of the hip, especially on the outer side of the hip.  In rare cases, the bursa may become infected. This may cause a fever, as well as warmth and redness in the area. Symptoms may come and go. How is this diagnosed? This condition may be diagnosed based on:  A physical exam.  Your medical history.  X-rays.  Removal of fluid from your inflamed bursa for testing (biopsy). You may be sent to a health care provider who specializes in bone diseases (orthopedist) or a provider who specializes in joint inflammation (rheumatologist). How is this treated? This condition is treated by resting,  icing, applying pressure (compression), and raising (elevating) the injured area. This is called RICE treatment. In some cases, this may be enough to make your symptoms go away. Treatment may also include:  Using crutches.  Draining fluid out of the bursa to help relieve swelling.  Injecting medicine that helps to reduce inflammation (cortisone).  Additional medicines if the bursa is infected. Follow these instructions at home: Managing pain, stiffness, and swelling   If directed, put ice on the painful area. ? Put ice in a plastic bag. ? Place a towel between your skin and the bag. ? Leave the ice on for 20 minutes, 2-3 times a day. ? Raise (elevate) your hip above the level of your heart as much as you can without pain. To do this, try putting a pillow under your hips while you lie down. Activity  Return to your normal activities as told by your health care provider. Ask your health care provider what activities are safe for you.  Rest and protect your hip as much as possible until your pain and swelling get better. General instructions  Take over-the-counter and prescription medicines only as told by your health care provider.  Wear compression wraps only as told by your health care provider.  Do not use your hip to support your body weight until your health care provider says that you can. Use crutches as told by your health care provider.  Gently massage and  stretch your injured area as often as is comfortable.  Keep all follow-up visits as told by your health care provider. This is important. How is this prevented?  Exercise regularly, as told by your health care provider.  Warm up and stretch before being active.  Cool down and stretch after being active.  If an activity irritates your hip or causes pain, avoid the activity as much as possible.  Avoid sitting down for long periods at a time. Contact a health care provider if you:  Have a fever.  Develop new  symptoms.  Have difficulty walking or doing everyday activities.  Have pain that gets worse or does not get better with medicine.  Develop red skin or a feeling of warmth in your hip area. Get help right away if you:  Cannot move your hip.  Have severe pain. Summary  Hip bursitis is inflammation of a fluid-filled sac (bursa) in the hip joint.  Hip bursitis can cause mild to moderate pain, and symptoms often come and go over time.  This condition is treated with rest, ice, compression, elevation, and medicines. This information is not intended to replace advice given to you by your health care provider. Make sure you discuss any questions you have with your health care provider. Document Released: 09/05/2001 Document Revised: 11/22/2017 Document Reviewed: 11/22/2017 Elsevier Patient Education  2020 [employer].

## 2019-02-02 NOTE — Progress Notes (Signed)
Subjective  CC:  Chief Complaint  Patient presents with  . Vaginal Discharge    thinks that it is comming from the tamoxifen   . Hip Pain    x 6 months unable to roll over in bed and increased pain with bending     HPI: Julia [name] is a 78 y.o. female who presents to the office today to address the problems listed above in the chief complaint.  Thick vaginal d/c x 2 years since on tamoxifen. Would like another medication. Also would like oncologist due to location: current onc is in [place] and pt lives in [place]. No vaginal pain or odor or itching. Has h/o urinary dysfunction and atrophic vaginitis. On tamox for breast cancer. Also has h/o colon cancer  Drug induced neuropathy: interferes with sleep. Took gabapentin x 2 and had visual nightmares. Some leg cramps. Some RLS sxs. Also on meds for parkinsons per neuro  L>R hip pain; painful when lies on side at night. No groin pain. Some low back pain w/o radicular sxs. She attributes it to side effects of prolia. Last injection in august. Next due in February. No myalgias or arthralgias, fevers or chills. No pain with ambulation.    Assessment  1. Medication side effect   2. Vaginal discharge   3. Trochanteric bursitis of left hip   4. 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 Dysfunctional voiding of urine   7. Malignant neoplasm of left female breast, unspecified estrogen receptor status, unspecified site of breast ([place])   8. History of colon cancer      Plan   Vaginal discharge: side effect from tamox:  To discuss with oncologist. R/o infection with vaginal testing.   Hip bursitis: Ice and stretching. Pt defers steroid injections at this time. Doubt related to prolia  Osteoporosis: to schedule next prolia in feb  Neuropathy: she will try gabapentin again: if nightmares reoccur: will try lyrica  Urinary incontinence with h/o same. Reassured. Monitor. Rare occurrence.   Refer to oncologist in  [place].   Follow up: 3 months for recheck  05/04/2019  Orders Placed This Encounter  Procedures  . Ambulatory referral to Oncology   No orders of the defined types were placed in this encounter.     I reviewed the patients updated PMH, FH, and SocHx.    Patient Active Problem List   Diagnosis Date Noted  . Statin intolerance 10/13/2018    Priority: High  . Age-related osteoporosis without current pathological fracture 07/20/2016    Priority: High  . Hyperlipidemia 03/07/2015    Priority: High  . Impaired fasting glucose 03/07/2015    Priority: High  . History of colon cancer 02/13/2015    Priority: High  . Hypothyroidism due to Hashimoto's thyroiditis 02/13/2015    Priority: High  . Malignant neoplasm of female breast ([place]) 02/13/2015    Priority: High  . RLS (restless legs syndrome) 07/14/2018    Priority: Medium  . 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 Anxiety 09/29/2017    Priority: Medium  . Parkinson's disease ([place]) 09/29/2017    Priority: Medium  . Dysfunctional voiding of urine 09/13/2016    Priority: Medium  . Female stress incontinence 09/13/2016    Priority: Medium  . 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 Gastroesophageal reflux disease 09/13/2016    Priority: Medium  . Drug-induced polyneuropathy ([place]) 03/07/2015    Priority: Medium  .  Irritable bowel syndrome with both constipation and diarrhea 02/13/2015    Priority: Medium  . Perennial and seasonal allergic rhinitis 02/01/2018    Priority: Low  . Atrophic vaginitis 02/13/2015    Priority: Low  . Recurrent UTI 02/13/2015    Priority: Low  . Vitamin D deficiency disease 02/13/2015    Priority: Low  . Pelvic floor dysfunction 08/26/2018  . Fecal smearing 08/26/2018  . Fecal urgency 08/26/2018  . Female bladder prolapse 04/07/2018  . 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 Calcium  Citrate-Vitamin D 315-250 MG-UNIT TABS Take by mouth.  . Carbidopa-Levodopa ER (SINEMET CR) 25-100 MG tablet controlled release TAKE 1 TABLET BY MOUTH FOUR TIMES A DAY  . denosumab (PROLIA) 60 MG/ML SOSY injection Inject 60 mg into the skin every 6 (six) months.  . EPINEPHrine 0.3 mg/0.3 mL IJ SOAJ injection   . Fexofenadine HCl (ALLEGRA ALLERGY PO) Take by mouth.  . fluticasone (FLONASE) 50 MCG/ACT nasal spray SPRAY TWO SPRAYS IN EACH NOSTRIL ONCE DAILY AS NEEDED  . levothyroxine (SYNTHROID) 88 MCG tablet TAKE ONE TABLET BY MOUTH DAILY BEFORE BREAKFAST  . Multiple Vitamins-Minerals (CENTRUM SILVER PO) Take by mouth.  . Probiotic Product (PROBIOTIC DAILY PO) Take by mouth.  . tamoxifen (NOLVADEX) 20 MG tablet Take 20 mg by mouth daily.  Marland Kitchen triamcinolone cream (KENALOG) 0.1 % Apply 1 application topically 2 (two) times daily. For 2 weeks, then as needed  . UNABLE TO FIND Med Name: Allergy shots 1/week    Allergies: Patient is allergic to cefdinir; cholestyramine; contrast media [iodinated diagnostic agents]; ezetimibe; meperidine; nitrofurantoin; statins; and teriparatide. Family History: Patient family history includes Arthritis in her father, mother, and sister; Cancer in her brother, father, sister, sister, and sister; Depression in her father; Diabetes in her mother and sister; Heart disease in her mother; Hyperlipidemia in her brother and sister; Hypertension in her mother; Osteoporosis in her father. Social History:  Patient  reports that she has never smoked. She has never used smokeless tobacco. She reports current alcohol use. She reports that she does not use drugs.  Review of Systems: Constitutional: Negative for fever malaise or anorexia Cardiovascular: negative for chest pain Respiratory: negative for SOB or persistent cough Gastrointestinal: negative for abdominal pain  Objective  Vitals: BP 118/74   Pulse 92   Temp 98.9 F (37.2 C) (Temporal)   Ht 5' 4.5" (1.638 m)   Wt  138 lb 3.2 oz (62.7 kg)   SpO2 99%   BMI 23.36 kg/m  General: no acute distress , A&Ox3 HEENT: PEERL, conjunctiva normal, Oropharynx moist,neck is supple Cardiovascular:  RRR without murmur or gallop.  Respiratory:  Good breath sounds bilaterally, CTAB with normal respiratory effort Skin:  Warm, no rashes Bilateral tender gr troch with normal gait and good ROM of back.      Commons side effects, risks, benefits, and alternatives for medications and treatment plan prescribed today were discussed, and the patient expressed understanding of the given instructions. Patient is instructed to call or message via MyChart if he/she has any questions or concerns regarding our treatment plan. No barriers to understanding were identified. We discussed Red Flag symptoms and signs in detail. Patient expressed understanding regarding what to do in case of urgent or emergency type symptoms.   Medication list was reconciled, printed and provided to the patient in AVS. Patient instructions and summary information was reviewed with the patient as documented in the AVS. This  note was prepared with assistance of Systems analyst. Occasional wrong-word or sound-a-like substitutions may have occurred due to the inherent limitations of voice recognition software

## 2019-02-03 ENCOUNTER — Encounter: Payer: Self-pay | Admitting: Nurse Practitioner

## 2019-02-03 LAB — CERVICOVAGINAL ANCILLARY ONLY
Bacterial Vaginitis (gardnerella): NEGATIVE
Candida Glabrata: NEGATIVE
Candida Vaginitis: POSITIVE — AB
Comment: NEGATIVE
Comment: NEGATIVE
Comment: NEGATIVE
Comment: NEGATIVE
Trichomonas: NEGATIVE

## 2019-02-03 MED ORDER — FLUCONAZOLE 150 MG PO TABS: ORAL_TABLET | ORAL | 0 refills | Status: DC

## 2019-02-03 NOTE — Progress Notes (Signed)
Please call patient: I have reviewed his/her lab results. Her vaginal test shows she has a yeast infection. I've ordered some medication to take that will help.

## 2019-02-03 NOTE — Addendum Note (Signed)
Addended by: Billey Chang on: 02/03/2019 02:58 PM   Modules accepted: Orders

## 2019-02-07 ENCOUNTER — Other Ambulatory Visit: Payer: Self-pay

## 2019-02-07 ENCOUNTER — Ambulatory Visit (INDEPENDENT_AMBULATORY_CARE_PROVIDER_SITE_OTHER): Payer: Medicare HMO

## 2019-02-07 DIAGNOSIS — J309 Allergic rhinitis, unspecified: Secondary | ICD-10-CM

## 2019-02-14 ENCOUNTER — Ambulatory Visit (INDEPENDENT_AMBULATORY_CARE_PROVIDER_SITE_OTHER): Payer: Medicare HMO

## 2019-02-14 ENCOUNTER — Other Ambulatory Visit: Payer: Self-pay | Admitting: Allergy and Immunology

## 2019-02-14 ENCOUNTER — Other Ambulatory Visit: Payer: Self-pay

## 2019-02-14 DIAGNOSIS — J309 Allergic rhinitis, unspecified: Secondary | ICD-10-CM

## 2019-02-21 ENCOUNTER — Ambulatory Visit (INDEPENDENT_AMBULATORY_CARE_PROVIDER_SITE_OTHER): Payer: Medicare HMO

## 2019-02-21 ENCOUNTER — Other Ambulatory Visit: Payer: Self-pay

## 2019-02-21 DIAGNOSIS — J309 Allergic rhinitis, unspecified: Secondary | ICD-10-CM

## 2019-02-28 ENCOUNTER — Ambulatory Visit (INDEPENDENT_AMBULATORY_CARE_PROVIDER_SITE_OTHER): Payer: Medicare HMO

## 2019-02-28 ENCOUNTER — Other Ambulatory Visit: Payer: Self-pay

## 2019-02-28 DIAGNOSIS — J309 Allergic rhinitis, unspecified: Secondary | ICD-10-CM

## 2019-03-07 ENCOUNTER — Other Ambulatory Visit: Payer: Self-pay

## 2019-03-07 ENCOUNTER — Ambulatory Visit (INDEPENDENT_AMBULATORY_CARE_PROVIDER_SITE_OTHER): Payer: Medicare HMO

## 2019-03-07 DIAGNOSIS — J309 Allergic rhinitis, unspecified: Secondary | ICD-10-CM

## 2019-03-10 ENCOUNTER — Other Ambulatory Visit: Payer: Self-pay | Admitting: Family Medicine

## 2019-03-10 DIAGNOSIS — Z1231 Encounter for screening mammogram for malignant neoplasm of breast: Secondary | ICD-10-CM

## 2019-03-13 ENCOUNTER — Other Ambulatory Visit: Payer: Self-pay

## 2019-03-13 ENCOUNTER — Inpatient Hospital Stay: Payer: Medicare HMO | Attending: Nurse Practitioner | Admitting: Nurse Practitioner

## 2019-03-13 ENCOUNTER — Inpatient Hospital Stay: Payer: Medicare HMO

## 2019-03-13 ENCOUNTER — Encounter: Payer: Self-pay | Admitting: Nurse Practitioner

## 2019-03-13 ENCOUNTER — Telehealth: Payer: Self-pay | Admitting: Nurse Practitioner

## 2019-03-13 ENCOUNTER — Ambulatory Visit (HOSPITAL_COMMUNITY)
Admission: RE | Admit: 2019-03-13 | Discharge: 2019-03-13 | Disposition: A | Discharge status: Home / Self Care | Payer: Medicare HMO | Source: Ambulatory Visit | Attending: Nurse Practitioner | Admitting: Nurse Practitioner

## 2019-03-13 VITALS — BP 140/78 | HR 96 | Temp 98.2°F | Resp 18 | Ht 64.5 in | Wt 138.8 lb

## 2019-03-13 DIAGNOSIS — M818 Other osteoporosis without current pathological fracture: Secondary | ICD-10-CM | POA: Diagnosis not present

## 2019-03-13 DIAGNOSIS — C50412 Malignant neoplasm of upper-outer quadrant of left female breast: Secondary | ICD-10-CM | POA: Diagnosis not present

## 2019-03-13 DIAGNOSIS — Z79810 Long term (current) use of selective estrogen receptor modulators (SERMs): Secondary | ICD-10-CM | POA: Diagnosis not present

## 2019-03-13 DIAGNOSIS — E079 Disorder of thyroid, unspecified: Secondary | ICD-10-CM | POA: Diagnosis not present

## 2019-03-13 DIAGNOSIS — M1612 Unilateral primary osteoarthritis, left hip: Secondary | ICD-10-CM | POA: Diagnosis not present

## 2019-03-13 DIAGNOSIS — M25552 Pain in left hip: Secondary | ICD-10-CM

## 2019-03-13 DIAGNOSIS — Z85038 Personal history of other malignant neoplasm of large intestine: Secondary | ICD-10-CM | POA: Diagnosis not present

## 2019-03-13 DIAGNOSIS — Z923 Personal history of irradiation: Secondary | ICD-10-CM | POA: Insufficient documentation

## 2019-03-13 DIAGNOSIS — G20 Parkinson's disease: Secondary | ICD-10-CM | POA: Insufficient documentation

## 2019-03-13 DIAGNOSIS — Z170 Estrogen receptor positive status [ER+]: Secondary | ICD-10-CM | POA: Diagnosis not present

## 2019-03-13 DIAGNOSIS — C50919 Malignant neoplasm of unspecified site of unspecified female breast: Secondary | ICD-10-CM

## 2019-03-13 LAB — CBC WITH DIFFERENTIAL (CANCER CENTER ONLY)
Abs Immature Granulocytes: 0.02 10*3/uL (ref 0.00–0.07)
Basophils Absolute: 0.1 10*3/uL (ref 0.0–0.1)
Basophils Relative: 1 %
Eosinophils Absolute: 0.1 10*3/uL (ref 0.0–0.5)
Eosinophils Relative: 1 %
HCT: 42.4 % (ref 36.0–46.0)
Hemoglobin: 13.9 g/dL (ref 12.0–15.0)
Immature Granulocytes: 0 %
Lymphocytes Relative: 11 %
Lymphs Abs: 0.8 10*3/uL (ref 0.7–4.0)
MCH: 30.6 pg (ref 26.0–34.0)
MCHC: 32.8 g/dL (ref 30.0–36.0)
MCV: 93.4 fL (ref 80.0–100.0)
Monocytes Absolute: 0.6 10*3/uL (ref 0.1–1.0)
Monocytes Relative: 8 %
Neutro Abs: 5.6 10*3/uL (ref 1.7–7.7)
Neutrophils Relative %: 79 %
Platelet Count: 258 10*3/uL (ref 150–400)
RBC: 4.54 MIL/uL (ref 3.87–5.11)
RDW: 13.5 % (ref 11.5–15.5)
WBC Count: 7 10*3/uL (ref 4.0–10.5)
nRBC: 0 % (ref 0.0–0.2)

## 2019-03-13 LAB — CMP (CANCER CENTER ONLY)
ALT: <6 U/L (ref 0–44)
AST: 17 U/L (ref 15–41)
Albumin: 4.2 g/dL (ref 3.5–5.0)
Alkaline Phosphatase: 41 U/L (ref 38–126)
Anion gap: 11 (ref 5–15)
BUN: 22 mg/dL (ref 8–23)
CO2: 25 mmol/L (ref 22–32)
Calcium: 9.5 mg/dL (ref 8.9–10.3)
Chloride: 104 mmol/L (ref 98–111)
Creatinine: 1.01 mg/dL — ABNORMAL HIGH (ref 0.44–1.00)
GFR, Est AFR Am: >60 mL/min (ref >60)
GFR, Estimated: 54 mL/min — ABNORMAL LOW (ref >60)
Glucose, Bld: 103 mg/dL — ABNORMAL HIGH (ref 70–99)
Potassium: 4.3 mmol/L (ref 3.5–5.1)
Sodium: 140 mmol/L (ref 135–145)
Total Bilirubin: 0.2 mg/dL — ABNORMAL LOW (ref 0.3–1.2)
Total Protein: 7.1 g/dL (ref 6.5–8.1)

## 2019-03-13 IMAGING — DX DG HIP (WITH OR WITHOUT PELVIS) 1V*L*
2 series · 2 of 2 positions shown · non-contrast
Comparison: No prior.

CLINICAL DATA: Left hip pain.  History of breast cancer.

EXAM:
DG HIP (WITH OR WITHOUT PELVIS) 1V*L*

[hip lat]
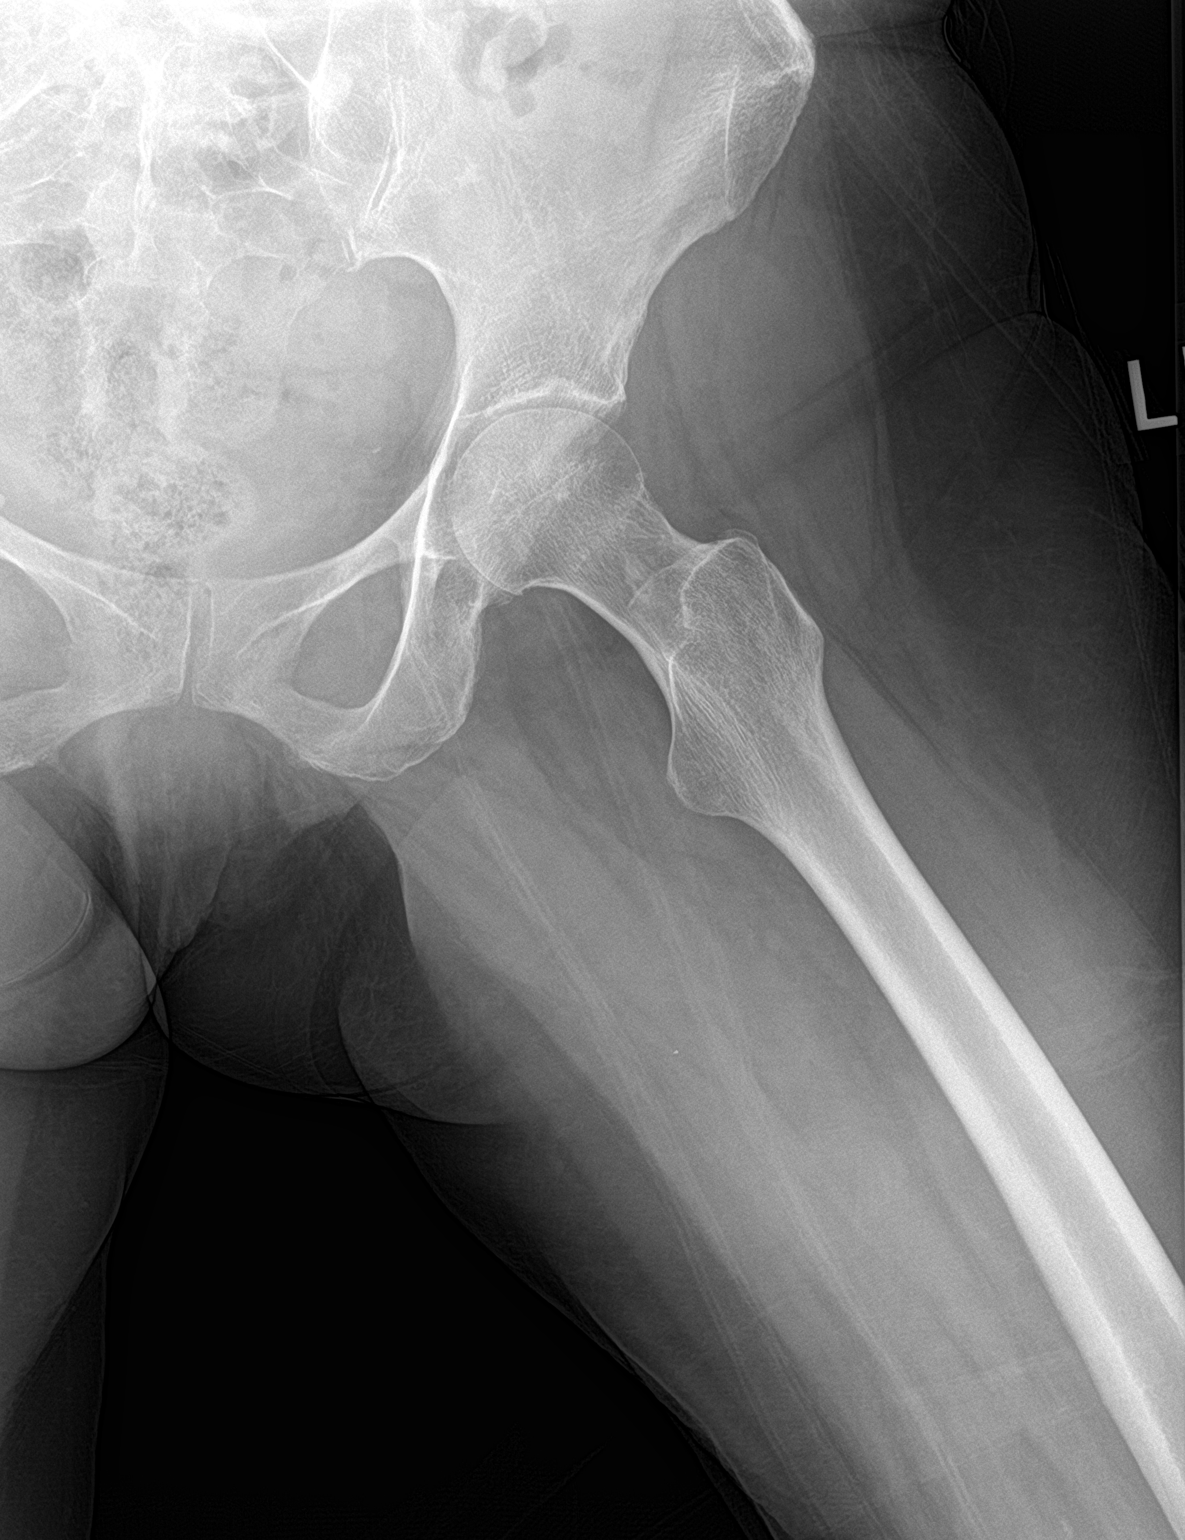

[hip ap]
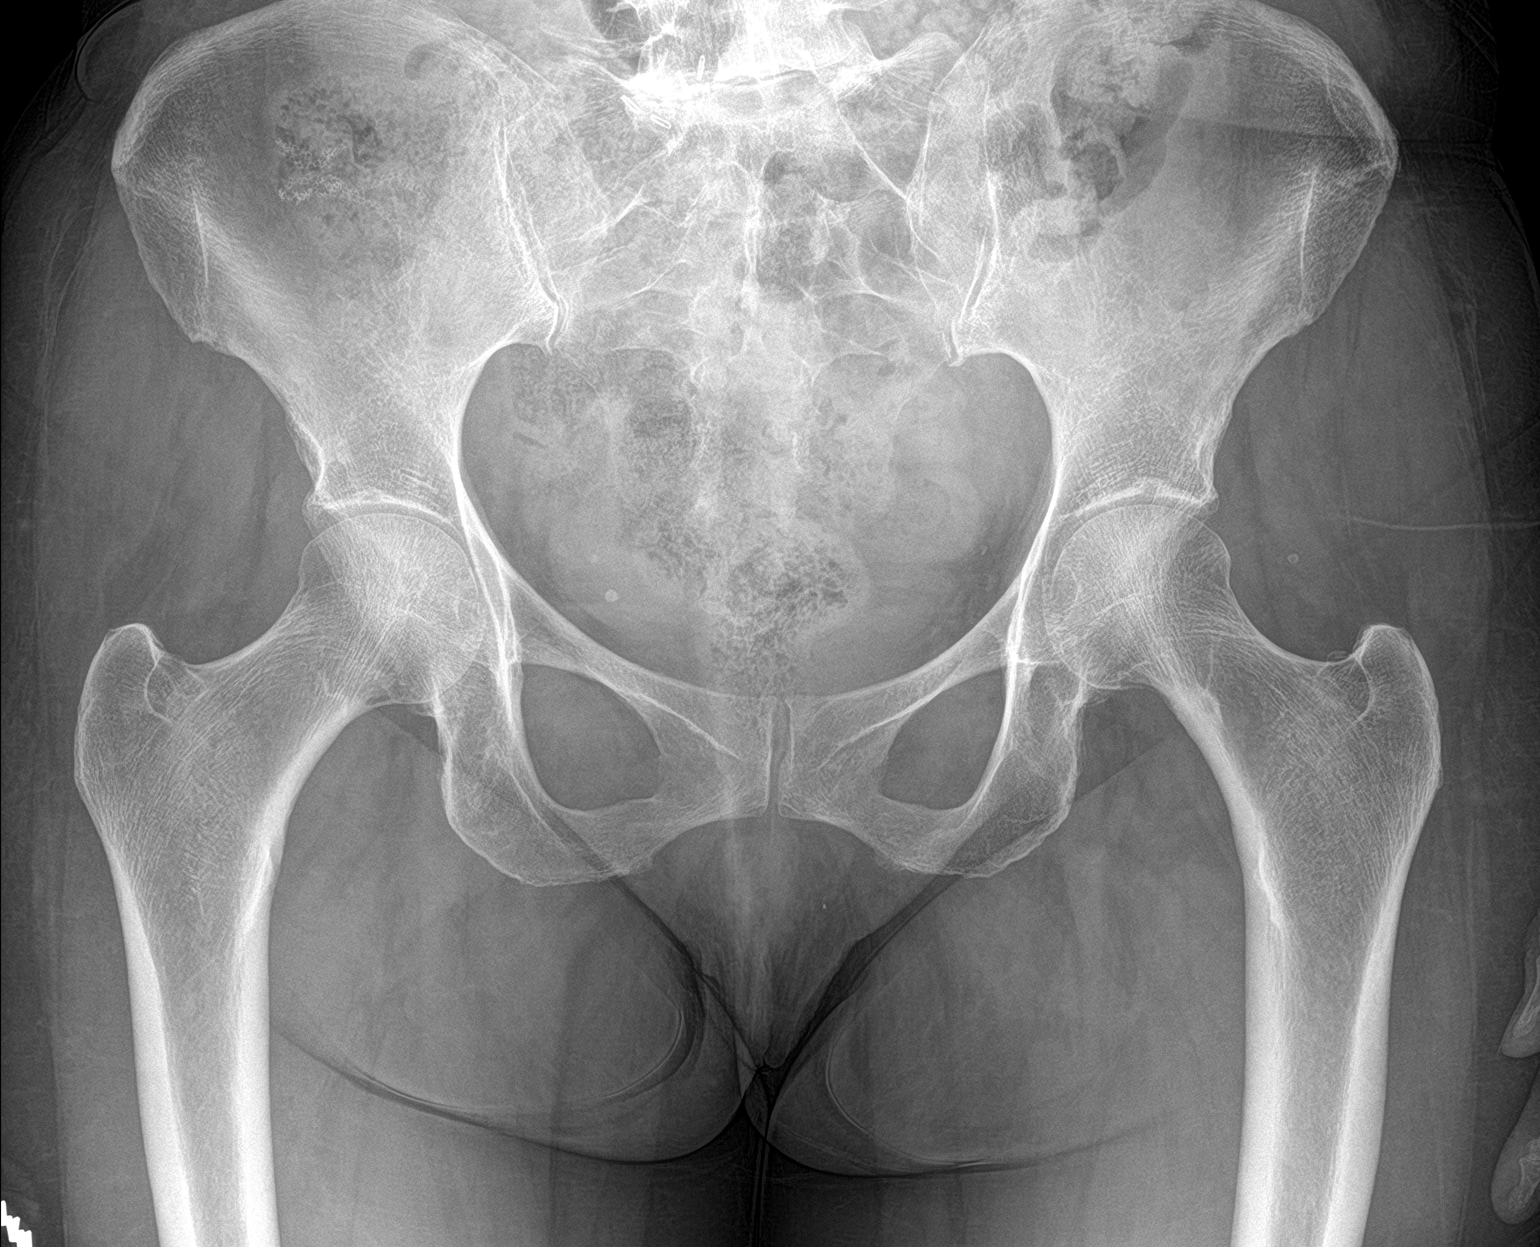

[2 of 2 positions shown; findings below may reference images not displayed]

FINDINGS: Questionable tiny mixed sclerotic/lucent focus noted over the left
femoral neck. This could represent a benign lesion such as osteoid
osteoma. A small metastatic lesion cannot be excluded. MRI of the
left hip suggested for further evaluation. Diffuse osteopenia.
Degenerative changes lumbar spine and both hips. Pelvic
calcifications consistent with phleboliths. Surgical clips in the
pelvis.
IMPRESSION: 1. Questionable tiny sclerotic/lucent focus noted over the left
femoral neck. This could represent a benign lesion such as osteoid
osteoma. Small metastatic lesion cannot be excluded. MRI of the left
hip suggested for further evaluation.

2. Diffuse osteopenia. Degenerative changes lumbar spine and both
hips.

## 2019-03-13 NOTE — Progress Notes (Addendum)
[facility]  [phone] Fax:([phone]) [place] Note   Patient Care Team: Julia Arnt, MD as PCP - General (Family Medicine) Julia Men, MD as Consulting Physician (Oncology) Julia Ducking, MD as Consulting Physician (Neurology) Julia [name], Sedalia Muta, MD as Consulting Physician (Allergy and Immunology) Julia [name], Julia Nab., MD as Consulting Physician (Gastroenterology)  Date of Service: 03/13/2019   CHIEF COMPLAINTS/PURPOSE OF CONSULTATION:  H/o breast and colon cancers, transfer from Julia [name] in [place] ILLNESS:  Julia [name] 78 y.o. female is here because of h/o breast and colon cancer transferring care from Julia [name] at [facility] She previously lived in [place], [place] and moved within the last few years to be closer to family. Complete records are not available. 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 She evidently had recurrence in the pelvis/ovary in 08/2002 and underwent TAH/BSO, omenectomy, LN dissection, and resection of peritoneal tumors followed by additional 6 months of FOLFOX chemo. Patient notes she has had subsequent colonoscopies that are "clean."   She also has h/o breast cancer that was found on screening mammogram in 01/2015. Diagnostic mammo/US demonstrated a palpable 18 mm mass at the 2 o'clock position left breast. A biopsy confirmed invasive ductal carcinoma, Nottingham grade 2, reportedly ER+/PR-/HER2-. She underwent left lumpectomy with SLN biopsy, path showed 2 cm IDC, grade 2, 2/5 SLN positive for carcinoma. It is unknown if mammaprint or oncotype testing was performed. She completed adjuvant radiation and began anti-estrogen therapy in early 2017. Due to her personal history and strong family history of cancer, she underwent genetic  testing with [employer] cancer analysis panel including lynch syndrome and BRCA1/2 among others, which was negative. Patient reportedly had undesirable side effects from AI, she cannot recall specifics, and is currently on Tamoxifen. Subsequent mammograms most recently in 03/2018 have been negative for malignancy.   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 Socially, she lives with her husband. She has 3 adult children who do not have cancer. She is independent with ADLs and can drive but prefers not to. She drinks alcohol rarely, maybe once per month. Denies tobacco use or other drug use. Family history is significant for stomach cancer - father, esophagus cancer - brother, uterine cancer - sister, breast cancer - sister, brain cancer - sister, and liver or kidney cancer - brother.   GYN HISTORY  Menarchal: age 75 LMP: unknown, natural HRT: >8 years G5P3, 2 miscarriages   Today, Julia [name] presents to clinic alone. She feels at her baseline health. Has become less active overtime due to PD. Deals with mild neuropathy in her feet. She is fatigued but remains ambulatory and mobile at home. She eats a regular diet, gets full quickly, and manages intermittent constipation. Denies rectal bleeding or abdominal pain. She reports new onset gradual left hip pain starting 2-3 months ago, no fall or injury. May have started after beginning prolia. Pain is occasionally on right side but more prominent and consistent on left. Pain is worse in the morning, improves after movement. Denies recent fever, chills, cough, chest pain. She has stable DOE, she attributes to pneumonia and pleurisy as a child. Sleep fluctuates, related to her tremors. She is anxious and  emotional lately, especially with driving.    MEDICAL HISTORY:  Past Medical History:  Diagnosis  Date  . Arthritis   . Breast cancer ([place])   . Colon cancer ([place])    previous colon cancer  . Hyperlipidemia   . 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 RLS (restless legs syndrome) 07/14/2018  . 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 ADENOIDECTOMY    . BREAST LUMPECTOMY Left    radiation only  . BUNIONECTOMY    . COLON SURGERY    . HEMORRHOID SURGERY    . LAPAROSCOPY    . NASAL SINUS SURGERY    . OOPHORECTOMY    . TONSILLECTOMY    . TUBAL LIGATION      SOCIAL HISTORY: Social History   Socioeconomic History  . Marital status: Married    Spouse name: Not on file  . Number of children: Not on file  . Years of education: 3  . Highest education level: Master's degree (e.g., MA, MS, MEng, MEd, MSW, MBA)  Occupational History  . Occupation: Retired Pharmacist, hospital   Tobacco Use  . Smoking status: Never Smoker  . Smokeless tobacco: Never Used  Substance and Sexual Activity  . Alcohol use: Yes    Comment: occ  . Drug use: Never  . Sexual activity: Not on file  Other Topics Concern  . Not on file  Social History Narrative   Right handed    Lives with husband    Caffeine 1 cup daily    Social Determinants of Health   Financial Resource Strain:   . Difficulty of Paying Living Expenses: Not on file  Food Insecurity:   . Worried About Charity fundraiser in the Last Year: Not on file  . Ran Out of Food in the Last Year: Not on file  Transportation Needs:   . Lack of Transportation (Medical): Not on file  . Lack of Transportation (Non-Medical): Not on file  Physical Activity:   . Days of Exercise per Week: Not on file  . Minutes of Exercise per Session: Not on file  Stress:   . [name] of Stress : Not on file  Social Connections:   . Frequency of Communication with Friends and Family: Not on file  . Frequency of Social Gatherings with Friends and Family: Not on file  . Attends  Religious Services: Not on file  . Active Member of Clubs or Organizations: Not on file  . Attends Archivist Meetings: Not on file  . Marital Status: Not on file  Intimate Partner Violence:   . Fear of Current or Ex-Partner: Not on file  . Emotionally Abused: Not on file  . Physically Abused: Not on file  . Sexually Abused: Not on file    FAMILY HISTORY: Family History  Problem Relation Age of Onset  . Arthritis Mother   . Diabetes Mother   . Heart disease Mother   . Hypertension Mother   . Arthritis Father   . Cancer Father   . Depression Father   . Osteoporosis Father   . Arthritis Sister   . Cancer Sister   . Diabetes Sister   . Cancer Brother   . Cancer Sister   . Cancer Sister   . Hyperlipidemia Sister   . Hyperlipidemia Brother     ALLERGIES:  is allergic to cefdinir; cholestyramine; contrast media [iodinated diagnostic agents]; ezetimibe; meperidine; nitrofurantoin; statins;  and teriparatide.  MEDICATIONS:  Current Outpatient Medications  Medication Sig Dispense Refill  . Calcium Citrate-Vitamin D 315-250 MG-UNIT TABS Take by mouth.    . Carbidopa-Levodopa ER (SINEMET CR) 25-100 MG tablet controlled release TAKE 1 TABLET BY MOUTH FOUR TIMES A DAY 360 tablet 1  . denosumab (PROLIA) 60 MG/ML SOSY injection Inject 60 mg into the skin every 6 (six) months.    . EPINEPHRINE 0.3 mg/0.3 mL IJ SOAJ injection INJECT INTO THE MIDDLE OF THE OUTER THIGH AND HOLD FOR 3 SECONDS AS NEEDED FOR SEVERE ALLERGIC REACTION THEN CALL 911 IF USED. 2 each 1  . Fexofenadine HCl (ALLEGRA ALLERGY PO) Take by mouth.    . fluconazole (DIFLUCAN) 150 MG tablet Take one tablet today; may repeat in 3 days if symptoms persist 2 tablet 0  . fluticasone (FLONASE) 50 MCG/ACT nasal spray SPRAY TWO SPRAYS IN EACH NOSTRIL ONCE DAILY AS NEEDED 16 mL 5  . levothyroxine (SYNTHROID) 88 MCG tablet TAKE ONE TABLET BY MOUTH DAILY BEFORE BREAKFAST 90 tablet 2  . Multiple Vitamins-Minerals (CENTRUM  SILVER PO) Take by mouth.    . Probiotic Product (PROBIOTIC DAILY PO) Take by mouth.    . tamoxifen (NOLVADEX) 20 MG tablet Take 20 mg by mouth daily.    Marland Kitchen triamcinolone cream (KENALOG) 0.1 % Apply 1 application topically 2 (two) times daily. For 2 weeks, then as needed 45 g 0  . UNABLE TO FIND Med Name: Allergy shots 1/week     No current facility-administered medications for this visit.    REVIEW OF SYSTEMS:   Constitutional: Denies fevers, chills or abnormal night sweats (+) mild fatigue at baseline Eyes: Denies blurriness of vision, double vision or watery eyes Ears, nose, mouth, throat, and face: Denies mucositis or sore throat Respiratory: Denies cough, chest pain or wheezes (+) DOE at baseline Cardiovascular: Denies palpitation, chest discomfort or lower extremity swelling Gastrointestinal:  Denies nausea, vomiting, diarrhea, melena, hematochezia, hematemesis, abdominal pain, heartburn or change in bowel habits (+) intermittent constipation (+) early satiety  Skin: Denies abnormal skin rashes Lymphatics: Denies new lymphadenopathy or easy bruising Neurological:Denies numbness, tingling or new weaknesses (+) neuropathy in feet, chronic (+) parkinson's disease (+) tremors, controlled with medication  Behavioral/Psych: Mood is stable, no new changes (+) anxiety (+) emotional lability with driving  MSK: (+) gradual 2-3 month h/o left hip pain worse in AM  All other systems were reviewed with the patient and are negative.  PHYSICAL EXAMINATION: ECOG PERFORMANCE STATUS: 1 - Symptomatic but completely ambulatory  Vitals:   03/13/19 1400  BP: 140/78  Pulse: 96  Resp: 18  Temp: 98.2 F (36.8 C)  SpO2: 98%   Filed Weights   03/13/19 1400  Weight: 138 lb 12.8 oz (63 kg)    GENERAL:alert, no distress and comfortable SKIN: no rash  EYES: normal, sclera clear LYMPH:  no palpable cervical, supraclavicular, or axillary lymphadenopathy LUNGS: clear with normal breathing  effort HEART: regular rate & rhythm, no lower extremity edema ABDOMEN:abdomen soft, non-tender and normal bowel sounds. Healed surgical incision Musculoskeletal: no focal spinal or left hip tenderness. Pain is not reproducible PSYCH: alert & oriented x 3 with fluent speech.  NEURO: No obvious tremor  BREAST: s/p left lumpectomy, breast and axillary incisions are completely healed. No palpable mass in either breast or axilla that I could appreciate. No nipple discharge or inversion.  LABORATORY DATA:  I have reviewed the data as listed CBC Latest Ref Rng & Units 03/13/2019 10/12/2018 10/06/2018  WBC  4.0 - 10.5 K/uL 7.0 6.8 6.8  Hemoglobin 12.0 - 15.0 g/dL 13.9 13.4 13.6  Hematocrit 36.0 - 46.0 % 42.4 40.8 41.9  Platelets 150 - 400 K/uL 258 218.0 232   CMP Latest Ref Rng & Units 03/13/2019 12/28/2018 10/12/2018  Glucose 70 - 99 mg/dL 103(H) - 98  BUN 8 - 23 mg/dL 22 - 16  Creatinine 0.44 - 1.00 mg/dL 1.01(H) - 0.90  Sodium 135 - 145 mmol/L 140 - 137  Potassium 3.5 - 5.1 mmol/L 4.3 - 4.1  Chloride 98 - 111 mmol/L 104 - 101  CO2 22 - 32 mmol/L 25 - 29  Calcium 8.9 - 10.3 mg/dL 9.5 - 9.2  Total Protein 6.5 - 8.1 g/dL 7.1 6.3 6.3  Total Bilirubin 0.3 - 1.2 mg/dL 0.2(L) - 0.5  Alkaline Phos 38 - 126 U/L 41 - 33(L)  AST 15 - 41 U/L 17 - 15  ALT 0 - 44 U/L <6 - 6    RADIOGRAPHIC STUDIES: I have personally reviewed the radiological images as listed and agreed with the findings in the report. DG Hip Unilat W or W/O Pelvis 1 View Left  Result Date: 03/14/2019 CLINICAL DATA:  Left hip pain.  History of breast cancer. EXAM: DG HIP (WITH OR WITHOUT PELVIS) 1V*L* COMPARISON:  No prior. FINDINGS: Questionable tiny mixed sclerotic/lucent focus noted over the left femoral neck. This could represent a benign lesion such as osteoid osteoma. A small metastatic lesion cannot be excluded. MRI of the left hip suggested for further evaluation. Diffuse osteopenia. Degenerative changes lumbar spine and both  hips. Pelvic calcifications consistent with phleboliths. Surgical clips in the pelvis. IMPRESSION: 1. Questionable tiny sclerotic/lucent focus noted over the left femoral neck. This could represent a benign lesion such as osteoid osteoma. Small metastatic lesion cannot be excluded. MRI of the left hip suggested for further evaluation. 2. Diffuse osteopenia. Degenerative changes lumbar spine and both hips. Electronically Signed   By: Marcello Moores  Register   On: 03/14/2019 05:50    ASSESSMENT & PLAN: 78 yo female with   1. 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  -Diagnosed in 01/2015, s/p lumpectomy, adjuvant radiation, and endocrine therapy starting in 2017, she was reportedly intolerance to AI and is now on tamoxifen, tolerating well except bothersome vaginal discharge. She will continue for total of at least 5 years.  -I have requested outside records to review final surgical path and to determine if mammaprint/oncotype testing was performed. This will help further classify her recurrence risk and duration of anti-estrogen therapy -We reviewed her recent mammogram on 04/04/2018 which shows post-lumpectomy changes in the left breast, otherwise negative for malignancy. She has breast density category d which is extremely dense and may obscure small masses. She is open to additional screening MRI but required anti-anxiety medication previously.  -Today's labs are unremarkable, breast exam is benign, no clinical concern for recurrence in the breast.  -We discussed surveillance plan, including annual mammogram, possible interval screening MRI, self breast exam, and routine office visit with breast exam and lab q4-6 months for at least 3 years, then q6-12 months for at least 5 years. She will continue tamoxifen.  -Will obtain outside records and f/u in 4 months   2. History of stage III  adenocarcinoma of cecum  -Diagnosed in 2002, s/p right hemicolectomy and adjuvant 5FU based chemo (6 months) with ovarian recurrence  in 2004, s/p TAH/BSO, omenectomy, LN dissection, and resection of peritoneal tumors and additional 6 months adjuvant FOLFOX  -Dr. 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 Rush Landmark    3. Left hip pain  -gradual onset 2-3 months ago, questionably after she began prolia injections  -pain is worse in the morning, improves with movement -I obtained left hip xray including pelvis on 12/14 which shows questionable tiny sclerotic/lucent focus over the left femoral neck which could represent benign osteoid osteoma however, osseous metastatic focus is not excluded.  -Will obtain MRI for further evaluation.   4. Family history, genetics  -Due to personal history of breast and colon cancers and strong family history including stomach, esophagus, uterine, breast, and brain cancer in father and siblings, she underwent genetic testing which was negative in 01/2015  5. Bone health, osteoporosis  -T-score is -2.7 AP spine in 2018 -left hip Xray on 12/14 shows diffuse osteopenia  -on Prolia and cal+vit D. I also reviewed the bone strengthening quality of tamoxifen -continue monitoring   6. Vaginal prolapse -likely secondary to abdominal surgeries  -she has been referred to urology per PCP but she has not gone yet.  -I have sent a message to Dr. Denman George and Lenna Sciara, NP in Gyn onc to see if they can help her, otherwise plan to refer to GYN  7. Chronic conditions - parkinson's diease, thyroid disease  -Followed by neurology, PCP  PLAN: -Requested complete outside records -Continue colon and breast cancer  surveillance -Continue tamoxifen -Left hip xray reviewed -Left hip MRI pending  -Continue f/u with PCP, neuro, GI -Message sent to Gyn onc regarding prolapse -Return for routine lab and f/u in 4 months, sooner if MRI is abnormal     Orders Placed This Encounter  Procedures  . DG Hip Unilat W or W/O Pelvis 1 View Left    Include pelvis    Standing Status:   Future    Number of Occurrences:   1    Standing Expiration Date:   03/12/2020    Order Specific Question:   Reason for Exam (SYMPTOM  OR DIAGNOSIS REQUIRED)    Answer:   2-3 mo left hip pain, h/o breast cancer on tamoxifen    Order Specific Question:   Preferred imaging location?    Answer:   [facility]    Order Specific Question:   Radiology Contrast Protocol - do NOT remove file path    Answer:   _[email or web address]  . CBC with Differential ([place] Only)    Standing Status:   Standing    Number of Occurrences:   50    Standing Expiration Date:   03/12/2021  . CMP ([place] only)    Standing Status:   Standing    Number of Occurrences:   50    Standing Expiration Date:   03/12/2021    All questions were answered. The patient knows to call the clinic with any problems, questions or concerns.    Julia Feeling, NP 03/14/2019 9:58 AM   Addendum  I have seen the patient, examined her. I agree with the assessment and and plan and have edited the notes. She was transferred from my partner Julia [name] at [facility] to me.   She is clinically  doing well, despite her multiple medical comorbidities, and history of metastatic colon cancer (cured) and right breast cancer. Will try to get her outside records about her breast cancer (surgicl path and molecular testing if it was done), to get a better sense of her risk of recurrence, to determine the duration of Tamoxifen.  She is tolerating tamoxifen well, except vaginal discharge.  She previously did not tolerate letrozole and  exemestane.  I encouraged her continue tamoxifen, and stay active, to reduce her risk of thrombosis.  Will get a x-ray of her left hip for her pain, and decide if she needs further imaging such as bone scan.    Truitt Merle 03/13/2019

## 2019-03-13 NOTE — Telephone Encounter (Signed)
Scheduled appt per 12/14 los.  Printed calendar and avs. 

## 2019-03-14 ENCOUNTER — Encounter: Payer: Self-pay | Admitting: Nurse Practitioner

## 2019-03-14 MED ORDER — LORAZEPAM 0.5 MG PO TABS: 0.5000 mg | ORAL_TABLET | Freq: Once | ORAL | 0 refills | Status: DC | PRN

## 2019-03-17 NOTE — Progress Notes (Signed)
Reviewed report/notes and updated pt's chart/history/PL and/or HM accordingly. 

## 2019-03-21 ENCOUNTER — Ambulatory Visit (INDEPENDENT_AMBULATORY_CARE_PROVIDER_SITE_OTHER): Payer: Medicare HMO

## 2019-03-21 ENCOUNTER — Telehealth: Payer: Self-pay

## 2019-03-21 ENCOUNTER — Other Ambulatory Visit: Payer: Self-pay

## 2019-03-21 DIAGNOSIS — J309 Allergic rhinitis, unspecified: Secondary | ICD-10-CM | POA: Diagnosis not present

## 2019-03-21 NOTE — Telephone Encounter (Signed)
Returned patients phone call in regard to her questions about her MRI today. She had some concerns about having somewhere to store her belonging such as her cell phone and I let her know that they should have a place there for her to put her things while the MRI is being done. She also wanted to know if they would have a pillow there or something for her to rest her neck on while getting the MRI. I let her know that they should have something there that can be placed under her neck while she lies down to get the MRI. She also wanted to know if she would be put to sleep for this procedure and I let her know that she would be awake. Her last question was if the she was getting contrast for her MRI and I let her know that her MRI was scheduled with NO CONTRAST. Patient verbalized understanding of everything. No further problems or concerns at this time.

## 2019-03-22 ENCOUNTER — Ambulatory Visit (INDEPENDENT_AMBULATORY_CARE_PROVIDER_SITE_OTHER): Payer: Medicare HMO | Admitting: Physician Assistant

## 2019-03-22 ENCOUNTER — Ambulatory Visit (HOSPITAL_COMMUNITY): Admission: RE | Admit: 2019-03-22 | Payer: Medicare HMO | Source: Ambulatory Visit

## 2019-03-22 ENCOUNTER — Other Ambulatory Visit: Payer: Self-pay

## 2019-03-22 ENCOUNTER — Other Ambulatory Visit (INDEPENDENT_AMBULATORY_CARE_PROVIDER_SITE_OTHER): Payer: Medicare HMO

## 2019-03-22 ENCOUNTER — Encounter: Payer: Self-pay | Admitting: Physician Assistant

## 2019-03-22 ENCOUNTER — Other Ambulatory Visit: Payer: Self-pay | Admitting: Physician Assistant

## 2019-03-22 VITALS — BP 126/78 | HR 94 | Temp 98.6°F | Ht 64.5 in | Wt 138.0 lb

## 2019-03-22 DIAGNOSIS — R300 Dysuria: Secondary | ICD-10-CM

## 2019-03-22 DIAGNOSIS — R21 Rash and other nonspecific skin eruption: Secondary | ICD-10-CM | POA: Diagnosis not present

## 2019-03-22 LAB — POC URINALSYSI DIPSTICK (AUTOMATED)
Bilirubin, UA: NEGATIVE
Blood, UA: POSITIVE
Glucose, UA: NEGATIVE
Ketones, UA: NEGATIVE
Nitrite, UA: POSITIVE
Protein, UA: POSITIVE — AB
Spec Grav, UA: >=1.03 — AB (ref 1.010–1.025)
Urobilinogen, UA: 0.2 E.U./dL
pH, UA: 6 (ref 5.0–8.0)

## 2019-03-22 MED ORDER — TRIAMCINOLONE ACETONIDE 0.1 % EX CREA: 1.0000 "application " | TOPICAL_CREAM | Freq: Two times a day (BID) | CUTANEOUS | 0 refills | Status: DC

## 2019-03-22 MED ORDER — SULFAMETHOXAZOLE-TRIMETHOPRIM 800-160 MG PO TABS: 1.0000 | ORAL_TABLET | Freq: Two times a day (BID) | ORAL | 0 refills | Status: AC

## 2019-03-22 NOTE — Progress Notes (Signed)
Julia [name] is a 78 y.o. female here for a new problem.  I,Manolo Bosket,acting as a Education administrator for [employer], [employer].,have documented all relevant documentation on the behalf of Julia Coke, PA,as directed by  Julia Coke, PA while in the presence of Julia [name], [place].  History of Present Illness:   Chief Complaint  Patient presents with  . Urinary Tract Infection    x 1 day     HPI   Dysuria Patient has history of UTI, most recently in February of 2020. Started having some burning and frequency today. In the past she has taken sulfa antibiotics and they worked well. Denies: fevers, chills, n/v, back pain, malaise, hematuria.  Rash  Patient has history of rash in lower abdomen. In the past she has received cream that helped but now sure what it was called. She would like to have a refill of that today. Denies any rash today.  Past Medical History:  Diagnosis Date  . Arthritis   . Breast cancer ([place])   . Colon cancer ([place])    previous colon cancer  . Hyperlipidemia   . 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 RLS (restless legs syndrome) 07/14/2018  . 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 Marital status: Married    Spouse name: Not on file  . Number of children: Not on file  . Years of education: 3  . Highest education level: Master's degree (e.g., MA, MS, MEng, MEd, MSW, MBA)  Occupational History  . Occupation: Retired Pharmacist, hospital   Tobacco Use  . Smoking status: Never Smoker  . Smokeless tobacco: Never Used  Substance and Sexual Activity  . Alcohol use: Yes    Comment: occ  . Drug use: Never  . Sexual activity: Not on file  Other Topics Concern  . Not on file  Social History Narrative   Right handed    Lives with husband    Caffeine 1 cup daily    Social Determinants of Health   Financial Resource Strain:   . Difficulty of Paying Living Expenses: Not on file  Food Insecurity:   .  Worried About Charity fundraiser in the Last Year: Not on file  . Ran Out of Food in the Last Year: Not on file  Transportation Needs:   . Lack of Transportation (Medical): Not on file  . Lack of Transportation (Non-Medical): Not on file  Physical Activity:   . Days of Exercise per Week: Not on file  . Minutes of Exercise per Session: Not on file  Stress:   . Feeling of Stress : Not on file  Social Connections:   . Frequency of Communication with Friends and Family: Not on file  . Frequency of Social Gatherings with Friends and Family: Not on file  . Attends Religious Services: Not on file  . Active Member of Clubs or Organizations: Not on file  . Attends Archivist Meetings: Not on file  . Marital Status: Not on file  Intimate Partner Violence:   . Fear of Current or Ex-Partner: Not on file  . Emotionally Abused: Not on file  . Physically Abused: Not on file  . Sexually Abused: Not on file    Past Surgical History:  Procedure Laterality Date  . ADENOIDECTOMY    . BREAST LUMPECTOMY Left    radiation only  . BUNIONECTOMY    . COLON SURGERY    . HEMORRHOID  SURGERY    . LAPAROSCOPY    . NASAL SINUS SURGERY    . OOPHORECTOMY    . TONSILLECTOMY    . TUBAL LIGATION      Family History  Problem Relation Age of Onset  . Arthritis Mother   . Diabetes Mother   . Heart disease Mother   . Hypertension Mother   . Arthritis Father   . Cancer Father   . Depression Father   . Osteoporosis Father   . Arthritis Sister   . Cancer Sister   . Diabetes Sister   . Cancer Brother   . Cancer Sister   . Cancer Sister   . Hyperlipidemia Sister   . Hyperlipidemia Brother     Allergies  Allergen Reactions  . Cefdinir   . Cholestyramine     GERD  . Contrast Media [Iodinated Diagnostic Agents] Diarrhea  . Ezetimibe   . Meperidine   . Nitrofurantoin Diarrhea  . Statins   . Teriparatide     Current Medications:   Current Outpatient Medications:  .  Calcium  Citrate-Vitamin D 315-250 MG-UNIT TABS, Take by mouth., Disp: , Rfl:  .  Carbidopa-Levodopa ER (SINEMET CR) 25-100 MG tablet controlled release, TAKE 1 TABLET BY MOUTH FOUR TIMES A DAY, Disp: 360 tablet, Rfl: 1 .  denosumab (PROLIA) 60 MG/ML SOSY injection, Inject 60 mg into the skin every 6 (six) months., Disp: , Rfl:  .  EPINEPHRINE 0.3 mg/0.3 mL IJ SOAJ injection, INJECT INTO THE MIDDLE OF THE OUTER THIGH AND HOLD FOR 3 SECONDS AS NEEDED FOR SEVERE ALLERGIC REACTION THEN CALL 911 IF USED., Disp: 2 each, Rfl: 1 .  Fexofenadine HCl (ALLEGRA ALLERGY PO), Take by mouth., Disp: , Rfl:  .  fluticasone (FLONASE) 50 MCG/ACT nasal spray, SPRAY TWO SPRAYS IN EACH NOSTRIL ONCE DAILY AS NEEDED, Disp: 16 mL, Rfl: 5 .  levothyroxine (SYNTHROID) 88 MCG tablet, TAKE ONE TABLET BY MOUTH DAILY BEFORE BREAKFAST, Disp: 90 tablet, Rfl: 2 .  LORazepam (ATIVAN) 0.5 MG tablet, Take 1 tablet (0.5 mg total) by mouth once as needed for up to 1 dose for anxiety. Take 1 hour before MRI, Disp: 2 tablet, Rfl: 0 .  Multiple Vitamins-Minerals (CENTRUM SILVER PO), Take by mouth., Disp: , Rfl:  .  Probiotic Product (PROBIOTIC DAILY PO), Take by mouth., Disp: , Rfl:  .  tamoxifen (NOLVADEX) 20 MG tablet, Take 20 mg by mouth daily., Disp: , Rfl:  .  UNABLE TO FIND, Med Name: Allergy shots 1/week, Disp: , Rfl:  .  sulfamethoxazole-trimethoprim (BACTRIM DS) 800-160 MG tablet, Take 1 tablet by mouth 2 (two) times daily for 7 days., Disp: 14 tablet, Rfl: 0 .  triamcinolone cream (KENALOG) 0.1 %, Apply 1 application topically 2 (two) times daily., Disp: 30 g, Rfl: 0   Review of Systems:   ROS Negative unless otherwise specified per HPI.  Vitals:   Vitals:   03/22/19 1009  BP: 126/78  Pulse: 94  Temp: 98.6 F (37 C)  TempSrc: Temporal  SpO2: 97%  Weight: 138 lb (62.6 kg)  Height: 5' 4.5" (1.638 m)     Body mass index is 23.32 kg/m.  Physical Exam:   Physical Exam Vitals and nursing note reviewed.  Constitutional:       General: She is not in acute distress.    Appearance: She is well-developed. She is not ill-appearing or toxic-appearing.  Cardiovascular:     Rate and Rhythm: Normal rate and regular rhythm.     Pulses:  Normal pulses.     Heart sounds: Normal heart sounds, S1 normal and S2 normal.     Comments: No LE edema Pulmonary:     Effort: Pulmonary effort is normal.     Breath sounds: Normal breath sounds.  Abdominal:     General: Abdomen is flat. Bowel sounds are normal.     Palpations: Abdomen is soft.     Tenderness: There is no abdominal tenderness. There is no right CVA tenderness or left CVA tenderness.  Skin:    General: Skin is warm and dry.  Neurological:     Mental Status: She is alert.     GCS: GCS eye subscore is 4. GCS verbal subscore is 5. GCS motor subscore is 6.  Psychiatric:        Speech: Speech normal.        Behavior: Behavior normal. Behavior is cooperative.     Results for orders placed or performed in visit on 03/22/19  POCT Urinalysis Dipstick (Automated)  Result Value Ref Range   Color, UA Yellow    Clarity, UA Cloudy    Glucose, UA Negative Negative   Bilirubin, UA Negative    Ketones, UA Negative    Spec Grav, UA >=1.030 (A) 1.010 - 1.025   Blood, UA Positive    pH, UA 6.0 5.0 - 8.0   Protein, UA Positive (A) Negative   Urobilinogen, UA 0.2 0.2 or 1.0 E.U./dL   Nitrite, UA Positive    Leukocytes, UA Large (3+) (A) Negative    Assessment and Plan:   Jadine was seen today for urinary tract infection.  Diagnoses and all orders for this visit:  Dysuria UA concerning for acute cystitis. Will trial Bactrim, she has had good results with this in the past. Urine culture pending. Worsening precautions advised in the interim. No red flags or evidence of systemic illness on today's exam.  Rash No rash present today, sounds like intermittent dermatitis, but she would like refill of kenalog should she need it. I have filled this for her today.  Other  orders -     sulfamethoxazole-trimethoprim (BACTRIM DS) 800-160 MG tablet; Take 1 tablet by mouth 2 (two) times daily for 7 days. -     triamcinolone cream (KENALOG) 0.1 %; Apply 1 application topically 2 (two) times daily.  . Reviewed expectations re: course of current medical issues. . Discussed self-management of symptoms. . Outlined signs and symptoms indicating need for more acute intervention. . Patient verbalized understanding and all questions were answered. . See orders for this visit as documented in the electronic medical record. . Patient received an After-Visit Summary.  CMA or LPN served as scribe during this visit. History, Physical, and Plan performed by medical provider. The above documentation has been reviewed and is accurate and complete.  Julia Coke, PA-C

## 2019-03-22 NOTE — Patient Instructions (Signed)
It was great to see you!  Start the oral bactrim. The ointment has been sent in for your rash if you need it.  Happy Holidays!  General instructions  Make sure you: ? Pee until your bladder is empty. ? Do not hold pee for a long time. ? Empty your bladder after sex. ? Wipe from front to back after pooping if you are a female. Use each tissue one time when you wipe.  Drink enough fluid to keep your pee pale yellow.  Keep all follow-up visits as told by your doctor. This is important. Contact a doctor if:  You do not get better after 1-2 days.  Your symptoms go away and then come back. Get help right away if:  You have very bad back pain.  You have very bad pain in your lower belly.  You have a fever.  You are sick to your stomach (nauseous).  You are throwing up.

## 2019-03-22 NOTE — Progress Notes (Signed)
69 

## 2019-03-22 NOTE — Addendum Note (Signed)
Addended by: Francis Dowse T on: 03/22/2019 10:08 AM   Modules accepted: Orders

## 2019-03-22 NOTE — Addendum Note (Signed)
Addended by: Francis Dowse T on: 03/22/2019 11:10 AM   Modules accepted: Orders

## 2019-03-24 LAB — URINE CULTURE
MICRO NUMBER:: 1227043
SPECIMEN QUALITY:: ADEQUATE

## 2019-03-28 ENCOUNTER — Other Ambulatory Visit: Payer: Self-pay

## 2019-03-28 ENCOUNTER — Ambulatory Visit (HOSPITAL_COMMUNITY)
Admission: RE | Admit: 2019-03-28 | Discharge: 2019-03-28 | Disposition: A | Discharge status: Home / Self Care | Payer: Medicare HMO | Source: Ambulatory Visit | Attending: Nurse Practitioner | Admitting: Nurse Practitioner

## 2019-03-28 DIAGNOSIS — M25552 Pain in left hip: Secondary | ICD-10-CM | POA: Diagnosis not present

## 2019-03-28 IMAGING — MR MR HIP*L* WO/W CM
5 of 10 series · 19 of 40 positions shown · IV contrast (gadavist)
Comparison: Plain films of the left hip [DATE].

CLINICAL DATA: Left hip pain in a patient with a history of breast
and colon carcinoma. Question metastatic disease.

EXAM:
MRI OF THE LEFT HIP WITHOUT AND WITH CONTRAST
TECHNIQUE: Multiplanar, multisequence MR imaging was performed both before and
after administration of intravenous contrast.
CONTRAST:  6 mL GADAVIST IV

[Series 3: T1 · coronal · 4.0mm · 0.78mm/px · 4 of 31 slices shown]
[im 1/31]
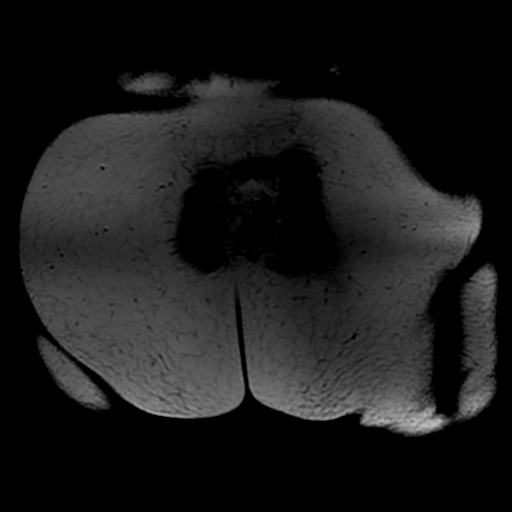
[im 11/31]
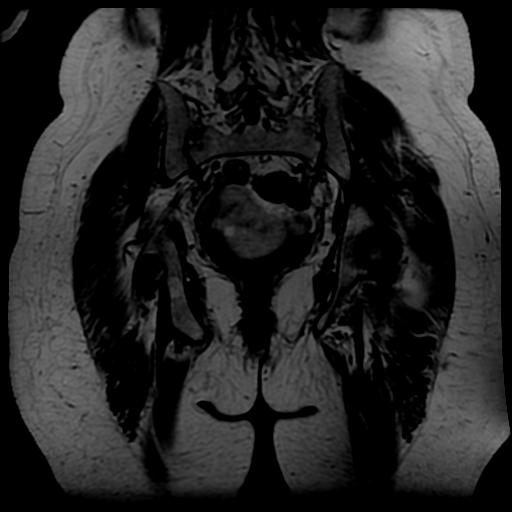
[im 21/31]
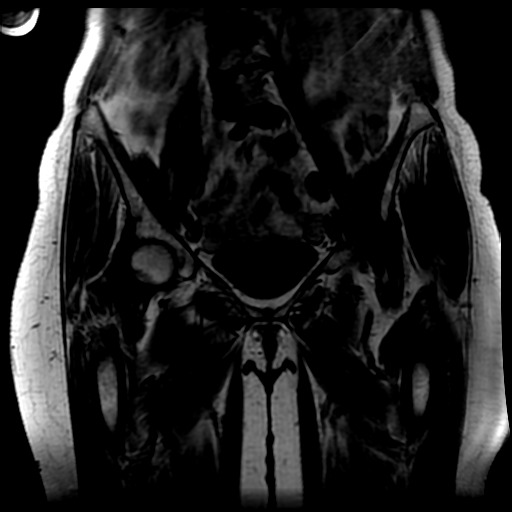
[im 31/31]
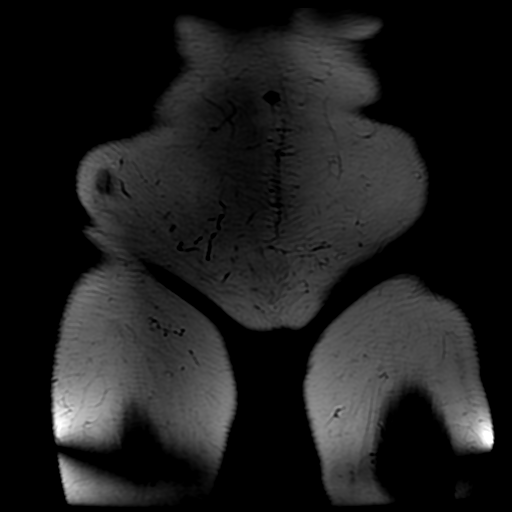

[Series 6: PD · sagittal · 4.0mm · 0.35mm/px · 4 of 28 slices shown (1 of 2)]
[im 1/28]
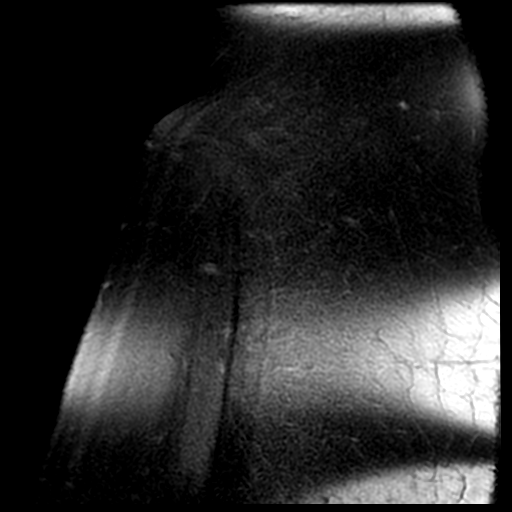
[im 10/28]
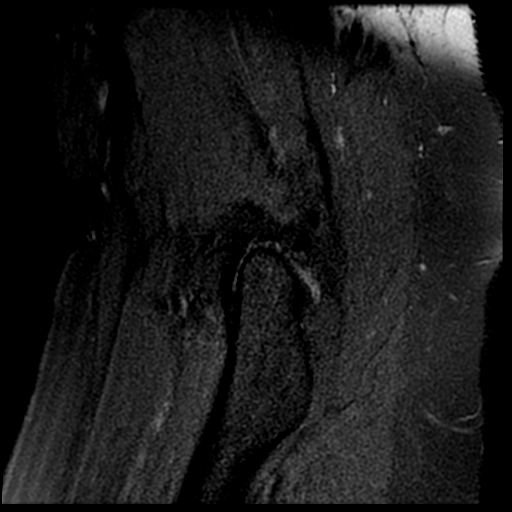
[im 19/28]
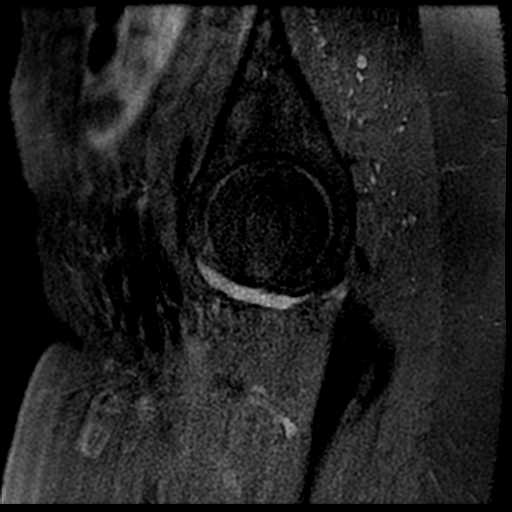
[im 28/28]
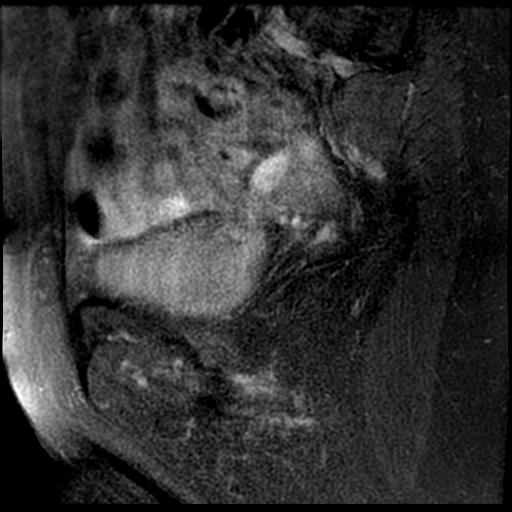

[Series 7: PD · coronal · 4.0mm · 0.35mm/px · 3 of 25 slices shown (2 of 2)]
[im 1/25]
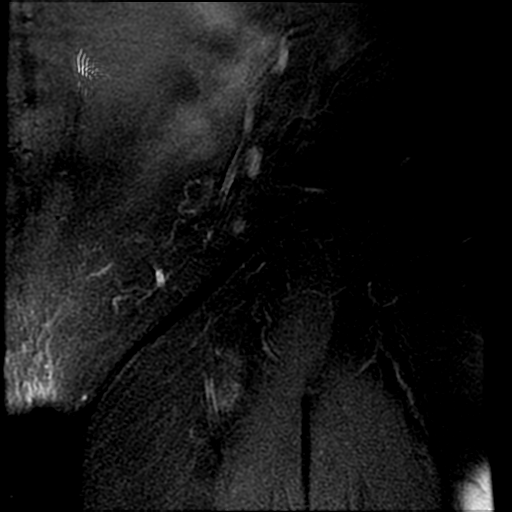
[im 13/25]
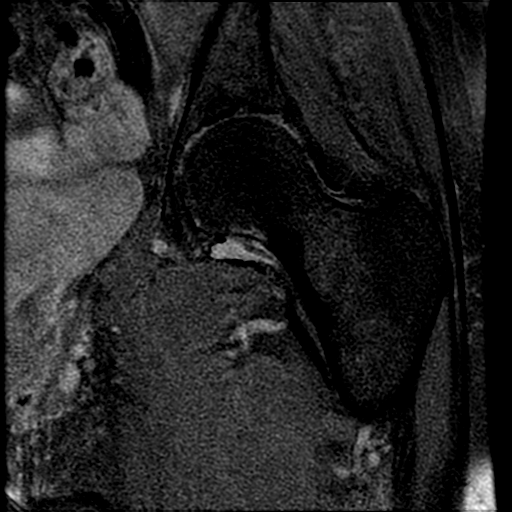
[im 25/25]
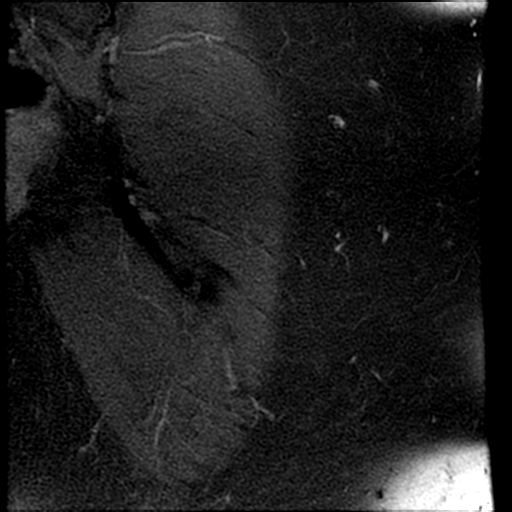

[Series 8: T1 fat-sat · axial · non-contrast · 4.0mm · 0.35mm/px · z∈[-190,-10]mm · 5 of 37 slices shown]
[im 1/37]
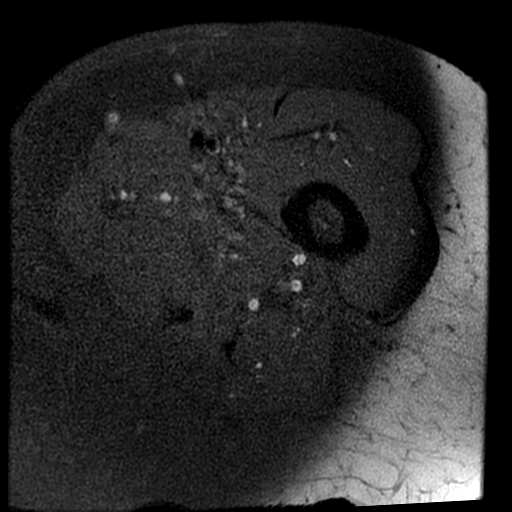
[im 10/37]
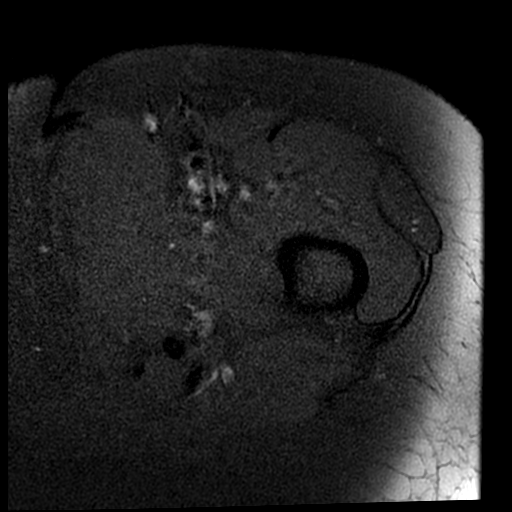
[im 19/37]
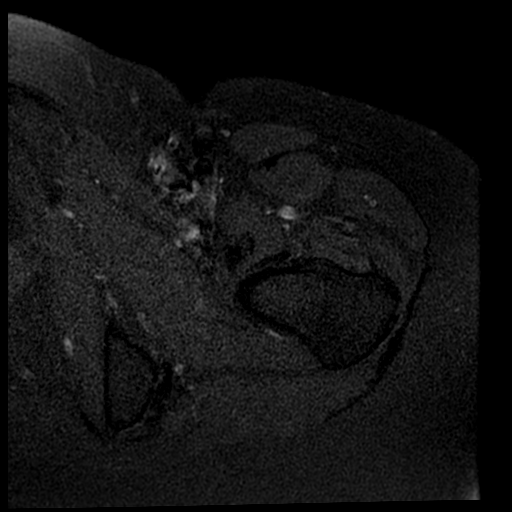
[im 28/37]
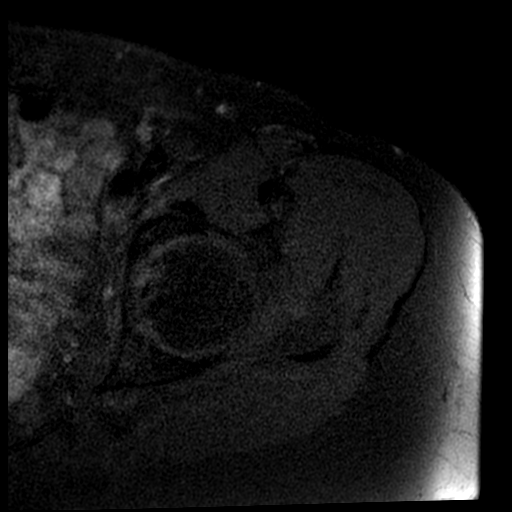
[im 37/37]
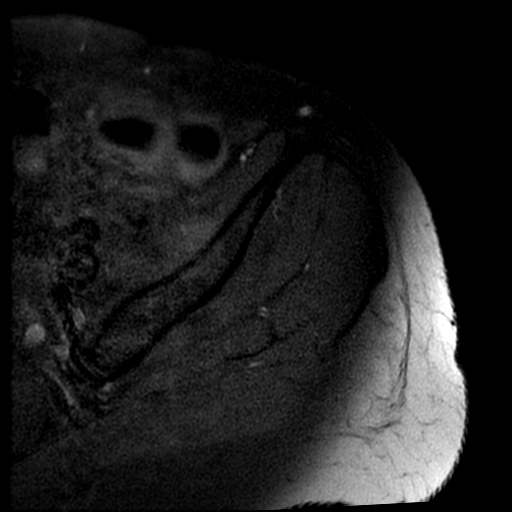

[Series 9: T1 fat-sat post-contrast · axial · 4.0mm · 0.35mm/px · z∈[-190,-100]mm · 3 of 37 slices shown]
[im 1/37]
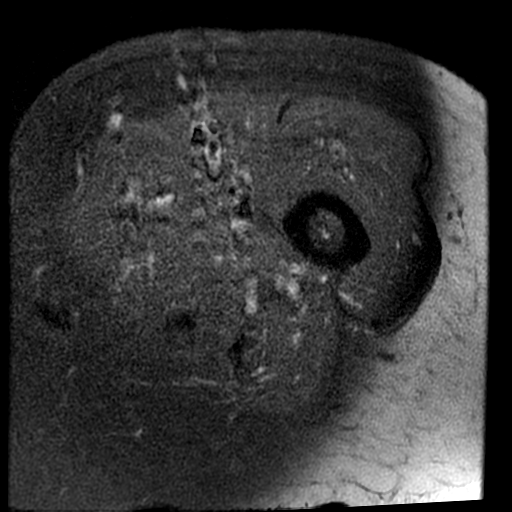
[im 10/37]
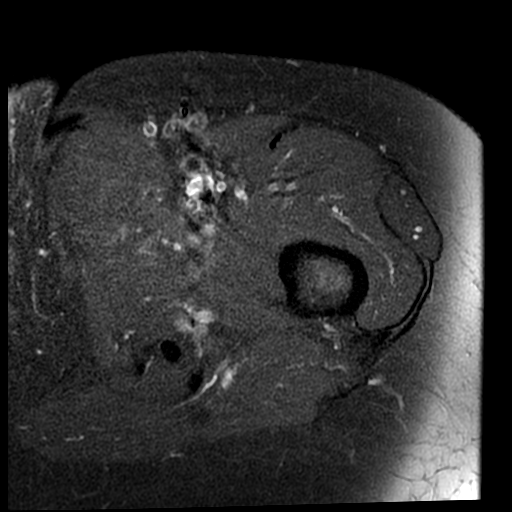
[im 19/37]
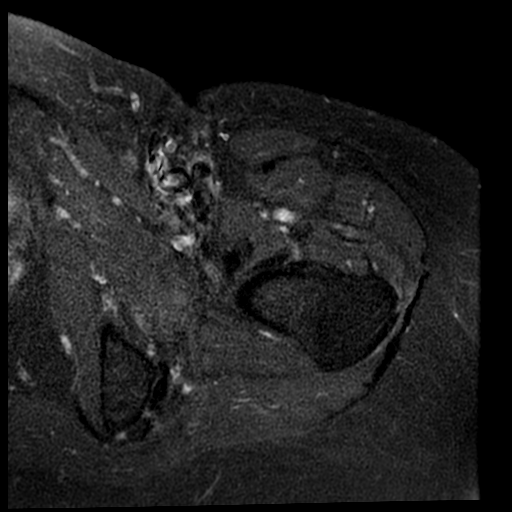

[19 of 40 positions shown; findings below may reference images not displayed]

FINDINGS: Bones: There is marrow edema in the L3 and L4 vertebral bodies which
is more extensive in L3 and eccentric anteriorly. Mild degenerative
endplate signal change is present at L4-5.

Bone marrow signal throughout the pelvis, hips and visualized femurs
is normal. Mild sclerosis in the medial aspect of the left femoral
neck is most consistent with buttressing secondary to degenerative
change. There is no subchondral cyst formation or edema about the
hips. No fracture, stress change or avascular necrosis.

Articular cartilage and labrum

Articular cartilage:  Thinned with associated joint space narrowing.

Labrum:  Intact.

Joint or bursal effusion

Joint effusion:  None.

Bursae: Negative.

Muscles and tendons

Muscles and tendons:  Intact.

Other findings

Miscellaneous: Imaged intrapelvic contents demonstrate postoperative
change of hysterectomy. No acute abnormality.
IMPRESSION: Abnormal signal in the endplates at L3-4 is worse anteriorly in the
L3 vertebral body. Finding could be due to degenerative disc change
but the appearance is worrisome for metastatic disease. Whole-body
bone scan could be used for further evaluation. Additionally, MRI of
the lumbar spine with and without contrast could be used for further
evaluation.

Negative for metastatic disease in the pelvis, hips or visualized
femurs. Questioned abnormality in the left femoral neck on prior
plain films is secondary to buttressing related to hip
osteoarthritis.

## 2019-03-28 MED ORDER — GADOBUTROL 1 MMOL/ML IV SOLN
6.0000 mL | Freq: Once | INTRAVENOUS | Status: AC | PRN
  Administered 2019-03-28: 6 mL via INTRAVENOUS

## 2019-03-29 ENCOUNTER — Telehealth: Payer: Self-pay | Admitting: Nurse Practitioner

## 2019-03-29 DIAGNOSIS — C50919 Malignant neoplasm of unspecified site of unspecified female breast: Secondary | ICD-10-CM

## 2019-03-29 DIAGNOSIS — Z170 Estrogen receptor positive status [ER+]: Secondary | ICD-10-CM

## 2019-03-29 DIAGNOSIS — M25552 Pain in left hip: Secondary | ICD-10-CM

## 2019-03-29 MED ORDER — LORAZEPAM 0.5 MG PO TABS: 0.5000 mg | ORAL_TABLET | Freq: Once | ORAL | 0 refills | Status: DC | PRN

## 2019-03-29 NOTE — Telephone Encounter (Signed)
After reviewing hip MRI with Dr. Burr Medico I called Julia [name] to discuss the report. The abnormality in left femur neck is felt to represent OA finding. There is abnormal signal in L3-L4 which could represent degenerative disc change but metastasis is not excluded. We will follow radiologist's recommendation to proceed with lumbar MRI and bone scan to r/o bone metastasis. Patient agrees. Will get insurance approval before scheduling. Ativan was refilled per her request for anxiety related to scans. Cira Rue, NP  03/29/2019

## 2019-03-30 ENCOUNTER — Other Ambulatory Visit: Payer: Self-pay

## 2019-03-30 ENCOUNTER — Ambulatory Visit (HOSPITAL_COMMUNITY)
Admission: RE | Admit: 2019-03-30 | Discharge: 2019-03-30 | Disposition: A | Discharge status: Home / Self Care | Payer: Medicare HMO | Source: Ambulatory Visit | Attending: Nurse Practitioner | Admitting: Nurse Practitioner

## 2019-03-30 DIAGNOSIS — Z853 Personal history of malignant neoplasm of breast: Secondary | ICD-10-CM | POA: Diagnosis not present

## 2019-03-30 DIAGNOSIS — Z170 Estrogen receptor positive status [ER+]: Secondary | ICD-10-CM | POA: Insufficient documentation

## 2019-03-30 DIAGNOSIS — C50919 Malignant neoplasm of unspecified site of unspecified female breast: Secondary | ICD-10-CM | POA: Diagnosis present

## 2019-03-30 IMAGING — MR MR BREAST BILAT WO/W CM
6 of 13 series · 20 of 48 positions shown · IV contrast (Yes)
Comparison: Previous mammograms, the most recent dated [DATE].

CLINICAL DATA: Status post left lumpectomy and radiation therapy
for invasive ductal carcinoma in [W2]. History of breast cancer in
her sister.

LABS:  None obtained on site today.
EXAM:
BILATERAL BREAST MRI WITH AND WITHOUT CONTRAST
TECHNIQUE: Multiplanar, multisequence MR images of both breasts were obtained
prior to and following the intravenous administration of 6 ml of
Gadavist

[Series 1: 3 plane loc · axial · 7.0mm · 1.56mm/px · 1 of 25 slices shown]
[im 1/25]
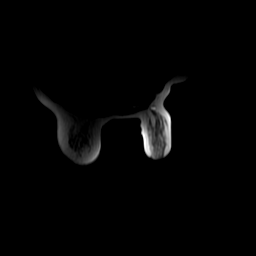

[Series 3: T1 · axial · non-contrast · 1.8mm · 0.62mm/px · z∈[-167,+49]mm · 5 of 240 slices shown]
[im 1/240]
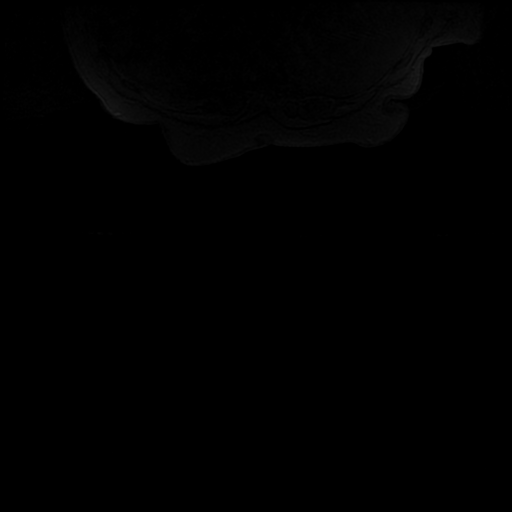
[im 60/240]
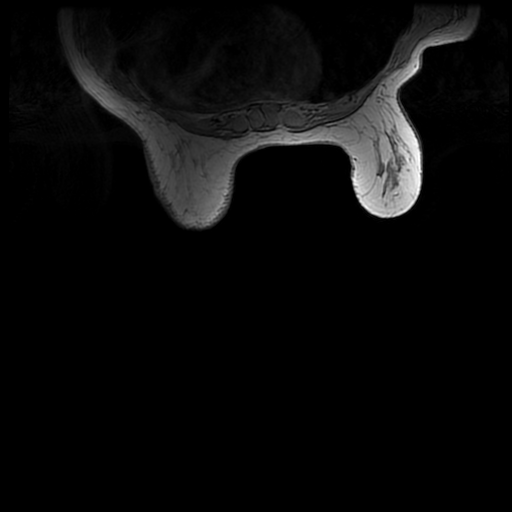
[im 120/240]
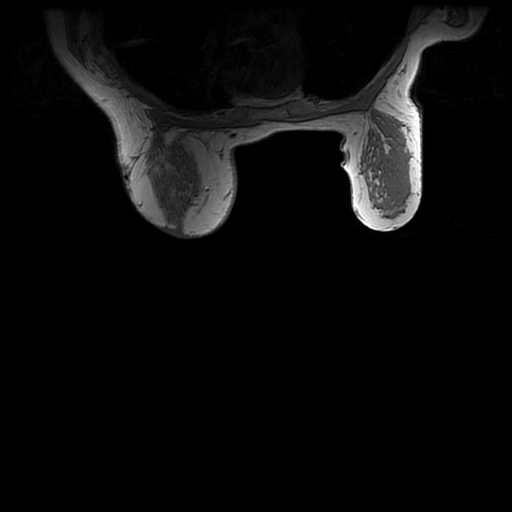
[im 180/240]
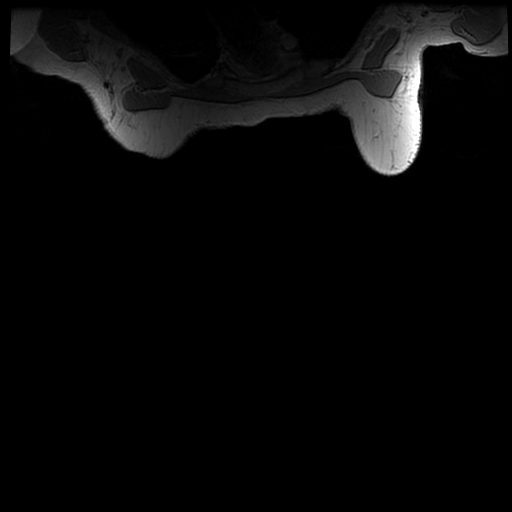
[im 240/240]
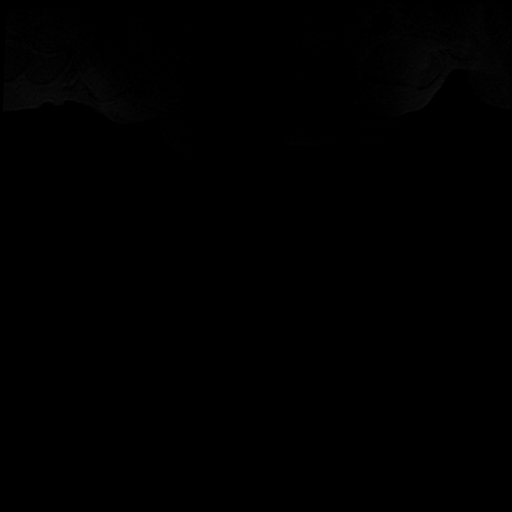

[Series 4: T2 · axial · 3.0mm · 0.70mm/px · 1 of 72 slices shown]
[im 1/72]
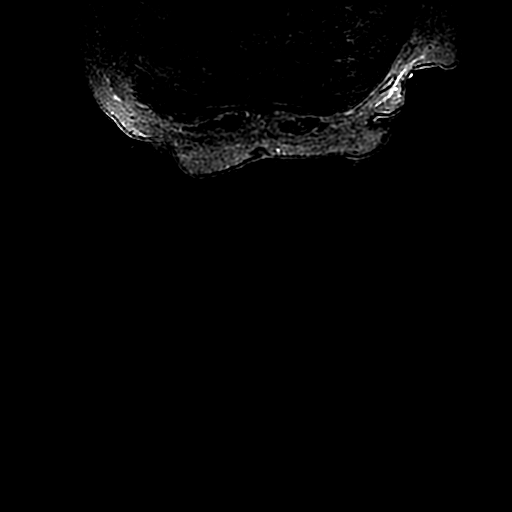

[Series 600: T1 fat-sat · axial · 1.8mm · 0.62mm/px · z∈[-167,+49]mm · 5 of 240 slices shown (1 of 3)]
[im 1/240]
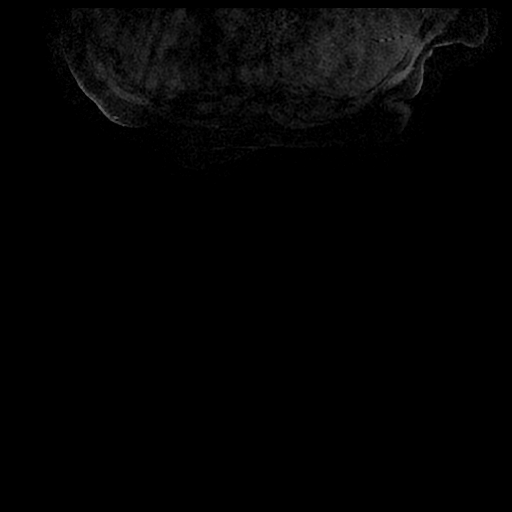
[im 60/240]
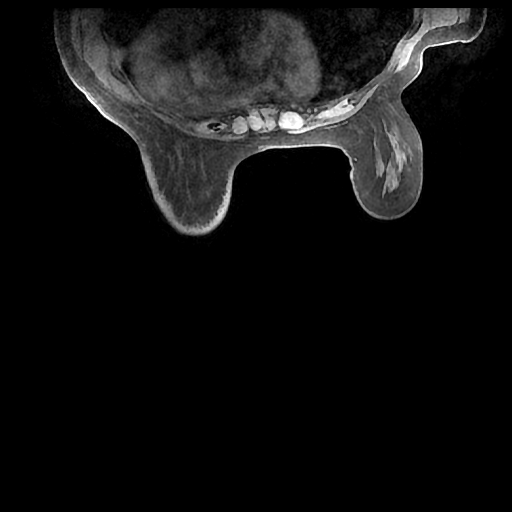
[im 120/240]
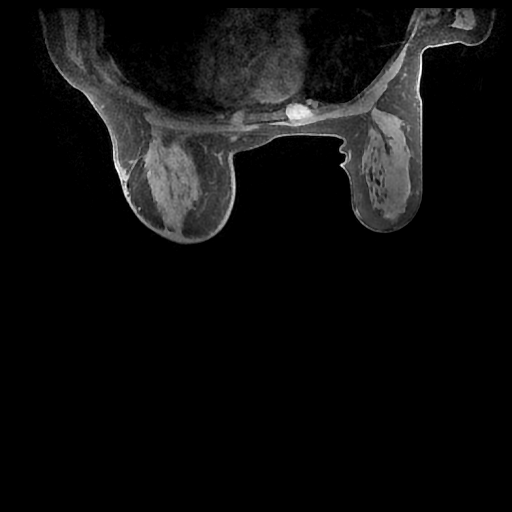
[im 180/240]
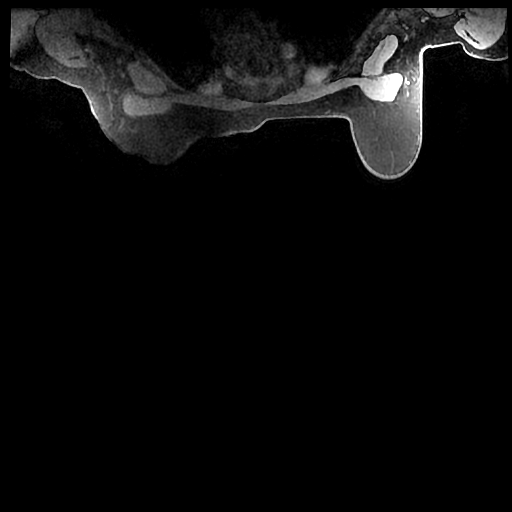
[im 240/240]
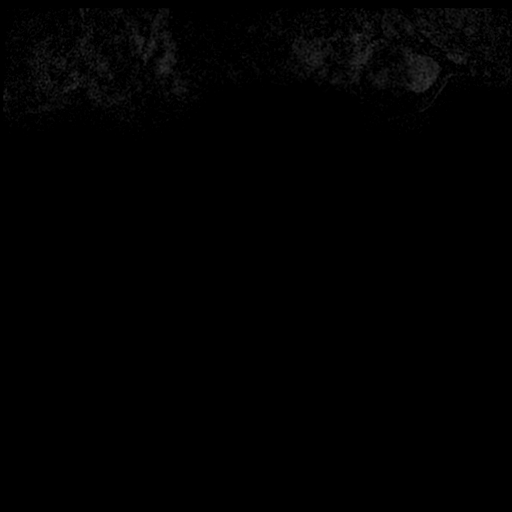

[Series 601: T1 fat-sat · axial · 1.8mm · 0.62mm/px · z∈[-167,+49]mm · 5 of 240 slices shown (2 of 3)]
[im 1/240]
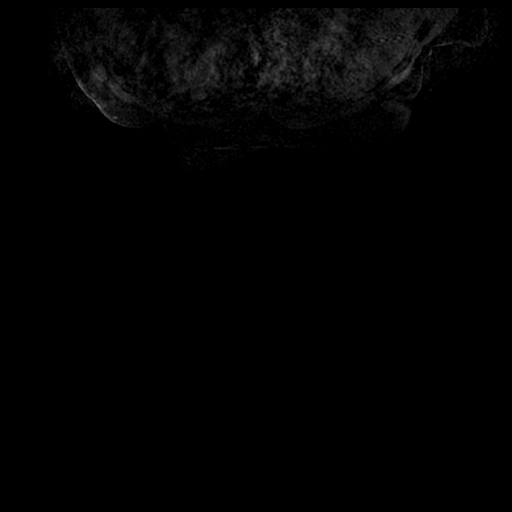
[im 60/240]
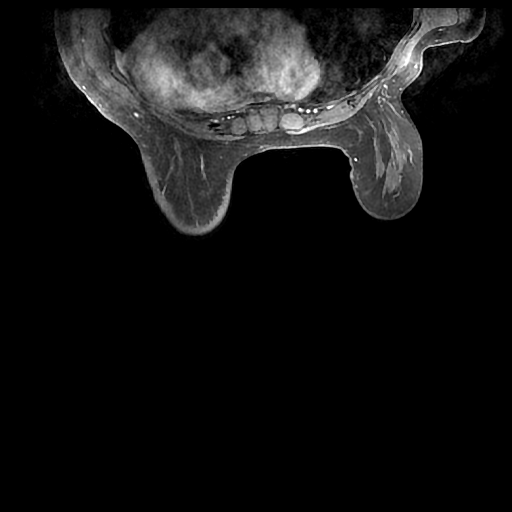
[im 120/240]
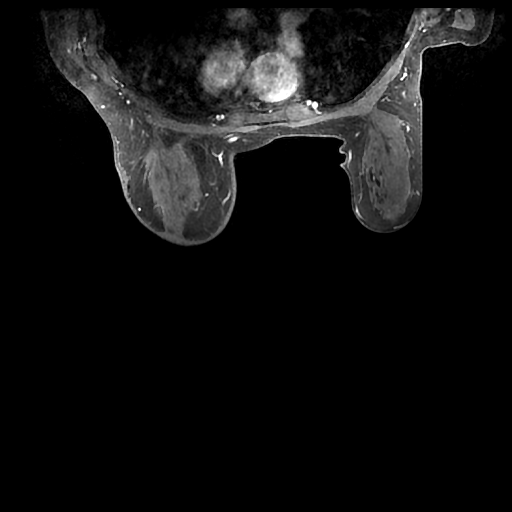
[im 180/240]
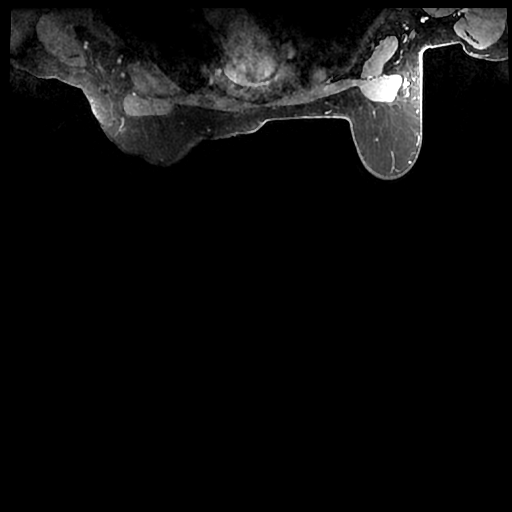
[im 240/240]
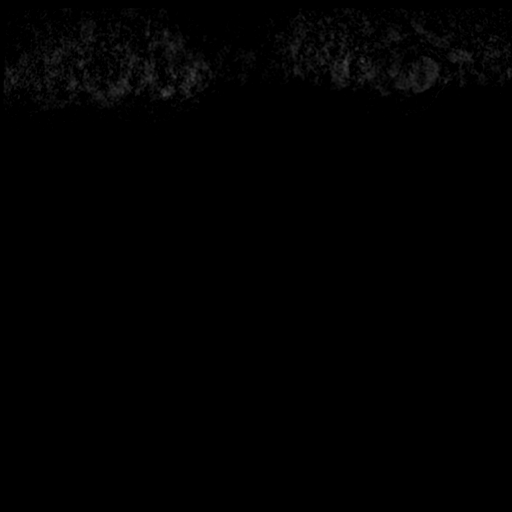

[Series 602: T1 fat-sat · axial · 1.8mm · 0.62mm/px · z∈[-167,-59]mm · 3 of 240 slices shown (3 of 3)]
[im 1/240]
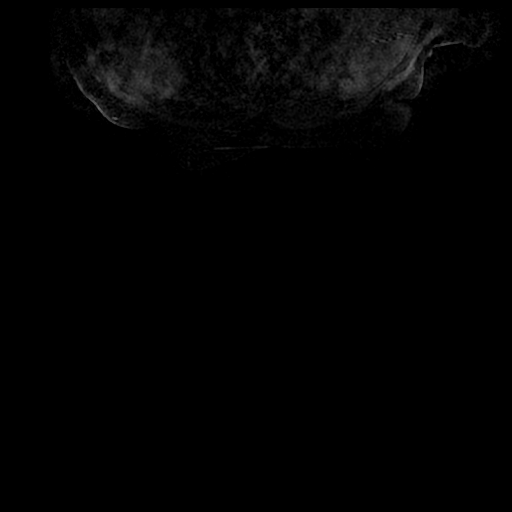
[im 60/240]
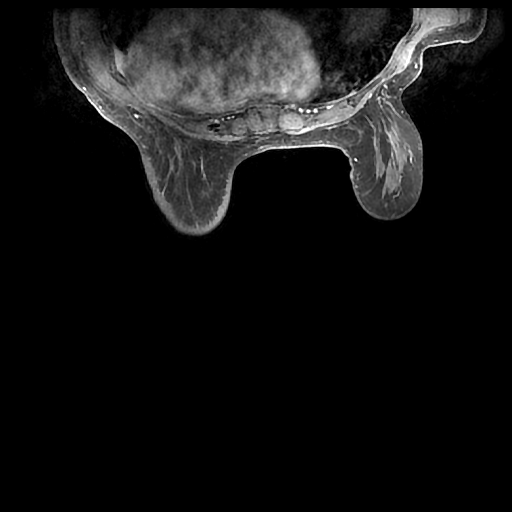
[im 120/240]
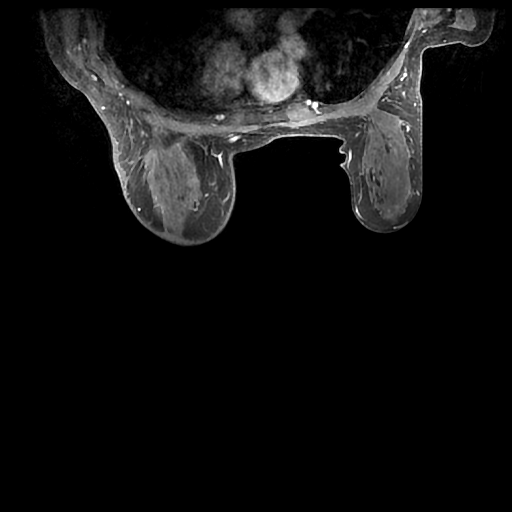

[20 of 48 positions shown; findings below may reference images not displayed]

Three-dimensional MR images were rendered by post-processing of the
original MR data on an independent workstation. The
three-dimensional MR images were interpreted, and findings are
reported in the following complete MRI report for this study. Three
dimensional images were evaluated at the independent [employer]
workstation
FINDINGS: Breast composition: d. Extreme fibroglandular tissue.

Background parenchymal enhancement: Moderate.

Right breast: No mass or abnormal enhancement.

Left breast: Post lumpectomy changes laterally. No mass or abnormal
enhancement.

Lymph nodes: No abnormal appearing lymph nodes.

Ancillary findings:  None.
IMPRESSION: No evidence of malignancy.

RECOMMENDATION:
Bilateral screening mammogram in 1 week when due.

BI-RADS CATEGORY  2: Benign.

## 2019-03-30 MED ORDER — GADOBUTROL 1 MMOL/ML IV SOLN
8.0000 mL | Freq: Once | INTRAVENOUS | Status: AC | PRN
  Administered 2019-03-30: 8 mL via INTRAVENOUS

## 2019-04-04 ENCOUNTER — Ambulatory Visit (INDEPENDENT_AMBULATORY_CARE_PROVIDER_SITE_OTHER): Payer: Medicare HMO

## 2019-04-04 ENCOUNTER — Other Ambulatory Visit: Payer: Self-pay

## 2019-04-04 DIAGNOSIS — J309 Allergic rhinitis, unspecified: Secondary | ICD-10-CM | POA: Diagnosis not present

## 2019-04-04 NOTE — Progress Notes (Signed)
Awaiting call back.

## 2019-04-05 ENCOUNTER — Telehealth: Payer: Self-pay | Admitting: *Deleted

## 2019-04-05 NOTE — Telephone Encounter (Signed)
Per Cira Rue, NP, called to notify pt that her MRI is negative for evidence of breast cancer. Pt was appreciative and verbalized understanding.

## 2019-04-05 NOTE — Telephone Encounter (Signed)
-----   Message from Alla Feeling, NP sent at 04/04/2019  2:53 PM EST ----- Please let her know breast MRI is negative for evidence of breast cancer.  Thanks, Regan Rakers

## 2019-04-07 ENCOUNTER — Other Ambulatory Visit: Payer: Medicare HMO

## 2019-04-07 ENCOUNTER — Ambulatory Visit: Payer: Medicare HMO | Admitting: Hematology

## 2019-04-12 DIAGNOSIS — J3081 Allergic rhinitis due to animal (cat) (dog) hair and dander: Secondary | ICD-10-CM

## 2019-04-12 NOTE — Progress Notes (Signed)
VIALS EXP 04-11-20 

## 2019-04-13 ENCOUNTER — Encounter (HOSPITAL_COMMUNITY)
Admission: RE | Admit: 2019-04-13 | Discharge: 2019-04-13 | Disposition: A | Discharge status: Home / Self Care | Payer: Medicare HMO | Source: Ambulatory Visit | Attending: Nurse Practitioner | Admitting: Nurse Practitioner

## 2019-04-13 ENCOUNTER — Ambulatory Visit (HOSPITAL_COMMUNITY)
Admission: RE | Admit: 2019-04-13 | Discharge: 2019-04-13 | Disposition: A | Discharge status: Home / Self Care | Payer: Medicare HMO | Source: Ambulatory Visit | Attending: Nurse Practitioner | Admitting: Nurse Practitioner

## 2019-04-13 ENCOUNTER — Other Ambulatory Visit: Payer: Self-pay

## 2019-04-13 DIAGNOSIS — C50919 Malignant neoplasm of unspecified site of unspecified female breast: Secondary | ICD-10-CM | POA: Insufficient documentation

## 2019-04-13 DIAGNOSIS — M25552 Pain in left hip: Secondary | ICD-10-CM

## 2019-04-13 DIAGNOSIS — Z170 Estrogen receptor positive status [ER+]: Secondary | ICD-10-CM | POA: Insufficient documentation

## 2019-04-13 DIAGNOSIS — J3089 Other allergic rhinitis: Secondary | ICD-10-CM | POA: Diagnosis not present

## 2019-04-13 IMAGING — NM NM BONE WHOLE BODY
2 series · 2 of 2 positions shown · non-contrast
Comparison: None

Imaging correlation: MRI lumbar spine [DATE]

CLINICAL DATA: Breast cancer

EXAM:
NUCLEAR MEDICINE WHOLE BODY BONE SCAN
TECHNIQUE: Whole body anterior and posterior images were obtained approximately
3 hours after intravenous injection of radiopharmaceutical.
RADIOPHARMACEUTICALS:  21.3 mCi [ZX] MDP IV

[Series 1: whole body · 2.66mm/px · 1 of 1 slices shown (1 of 2)]
[im 1/1]
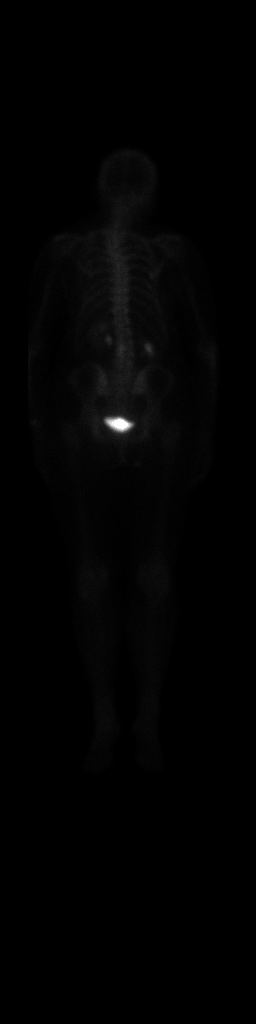

[Series 1: whole body · 2.66mm/px · 1 of 1 slices shown (2 of 2)]
[im 1/1]
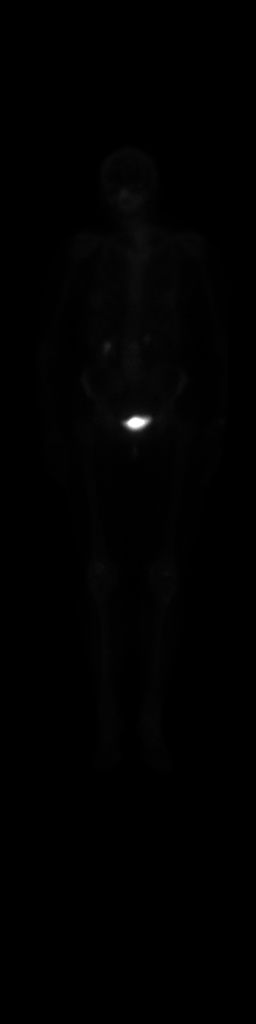

[2 of 2 positions shown; findings below may reference images not displayed]

FINDINGS: Minimal uptake at knees typically degenerative.

Dextroconvex lumbar scoliosis with mild degenerative changes along
the concave border of the LEFT lumbar spine likely degenerative.

Minimal abnormal tracer uptake at the posterior RIGHT eighth rib,
nonspecific.

No additional worrisome sites of abnormal osseous tracer
accumulation are seen.

Expected urinary tract and soft tissue distribution of tracer.
IMPRESSION: Single focus of abnormal osseous tracer accumulation at the
posterior RIGHT eighth rib, nonspecific; radiographic correlation
recommended.

No other worrisome scintigraphic abnormalities.

## 2019-04-13 IMAGING — MR MR LUMBAR SPINE WO/W CM
4 of 7 series · 19 of 48 positions shown · IV contrast (gadavist)
Comparison: Left hip MRI [DATE].

CLINICAL DATA: History of breast and colon carcinoma. Left hip
pain. Abnormal appearance of the L3-4 level on prior hip MRI.

EXAM:
MRI LUMBAR SPINE WITHOUT AND WITH CONTRAST
TECHNIQUE: Multiplanar and multiecho pulse sequences of the lumbar spine were
obtained without and with intravenous contrast.
CONTRAST:  6 mL GADAVIST IV SOLN

[Series 3: T1 · sagittal · 4.0mm · 0.51mm/px · 3 of 14 slices shown (1 of 2)]
[im 1/14]
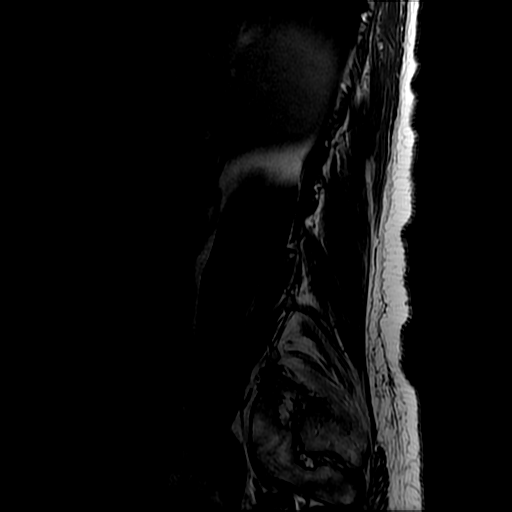
[im 7/14]
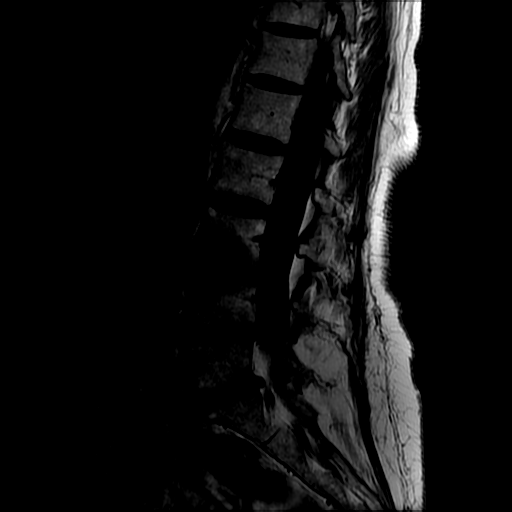
[im 14/14]
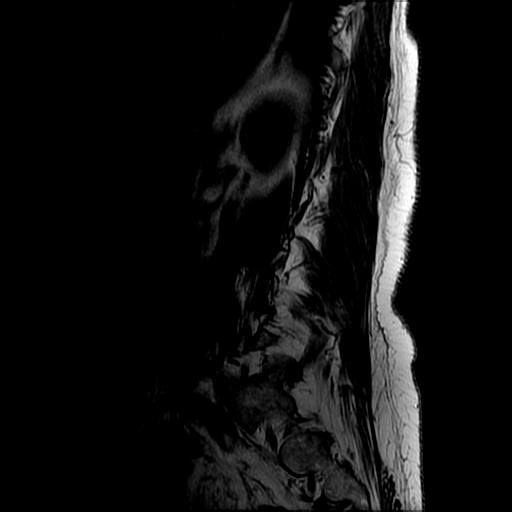

[Series 5: T2 · axial · 4.0mm · 0.39mm/px · z∈[-50,+124]mm · 10 of 40 slices shown]
[im 1/40]
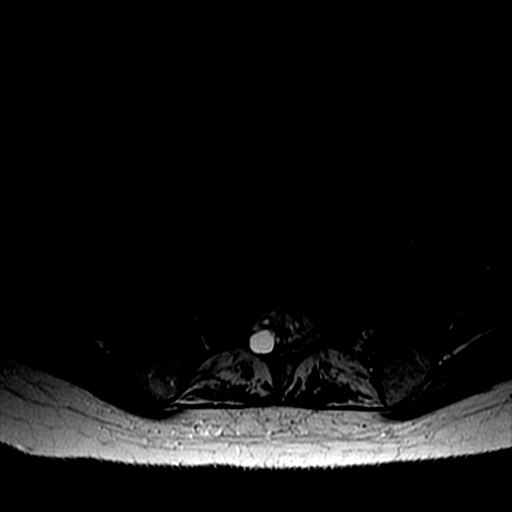
[im 4/40]
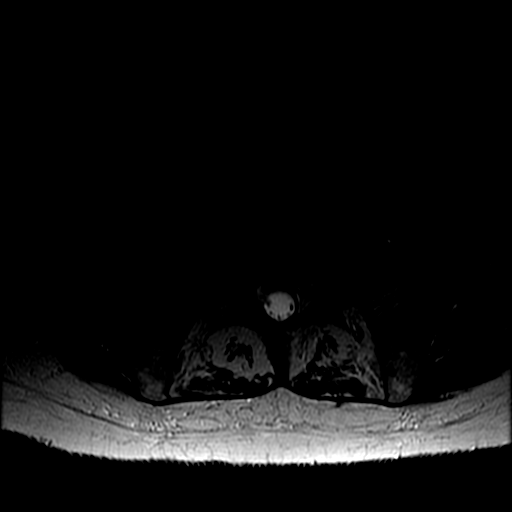
[im 8/40]
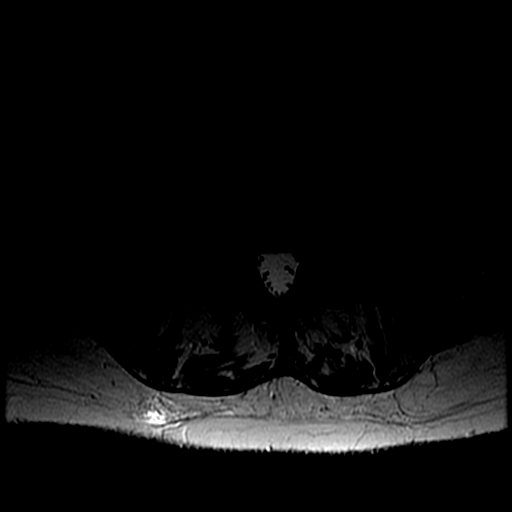
[im 12/40]
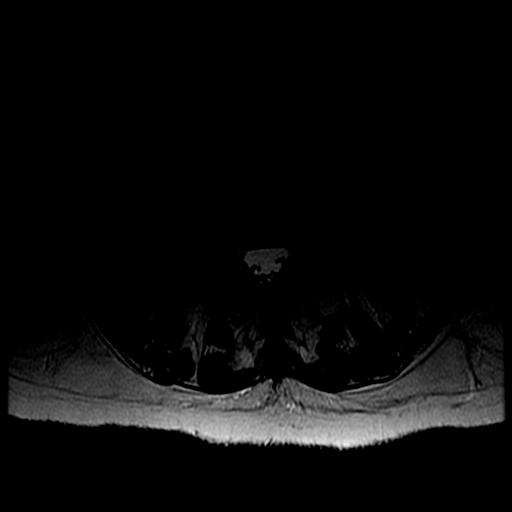
[im 16/40]
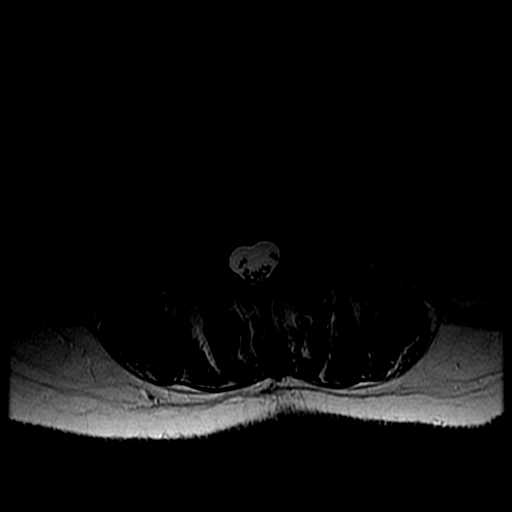
[im 20/40]
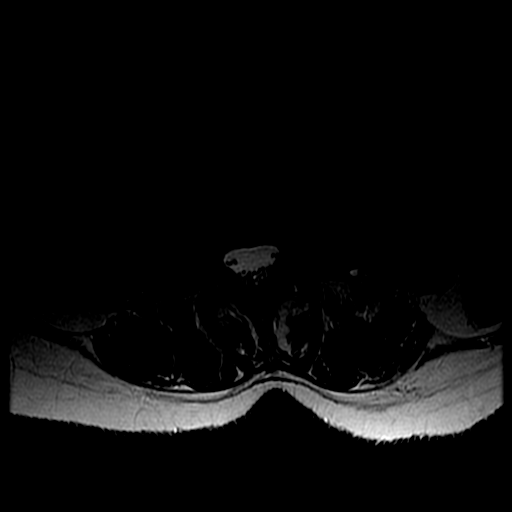
[im 24/40]
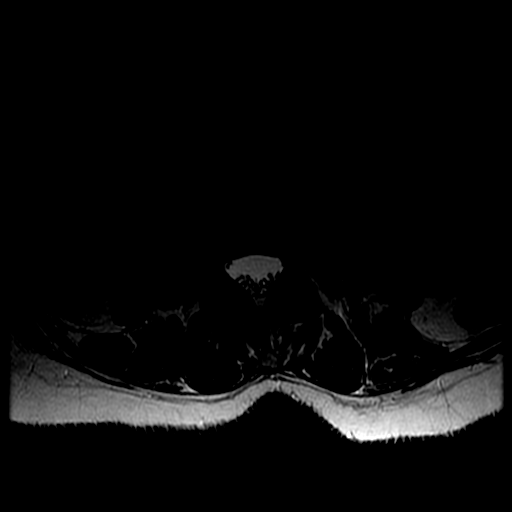
[im 28/40]
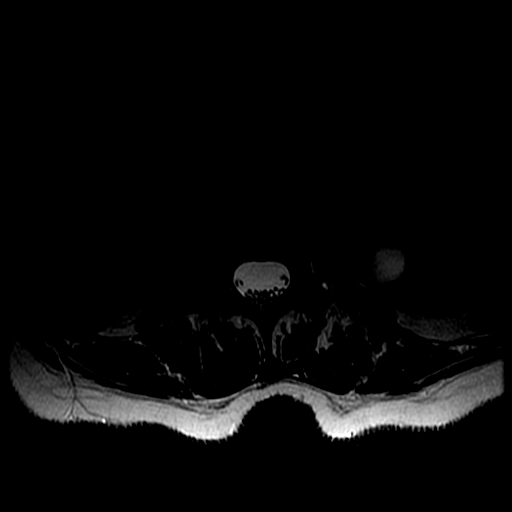
[im 32/40]
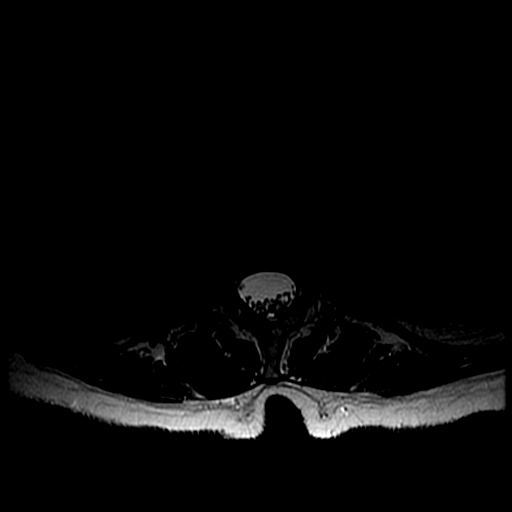
[im 36/40]
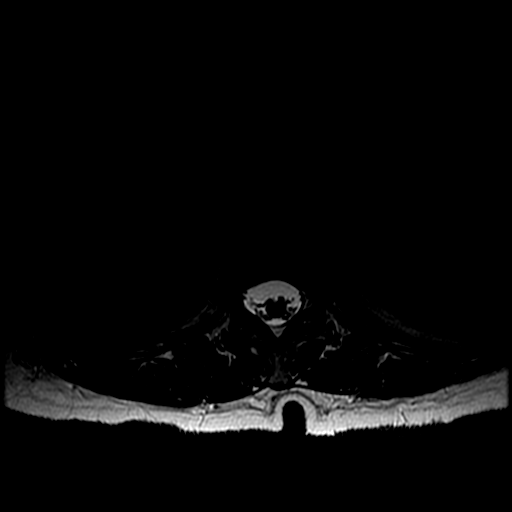

[Series 6: T1 · axial · 4.0mm · 0.39mm/px · z∈[-35,+124]mm · 3 of 40 slices shown (2 of 2)]
[im 4/40]
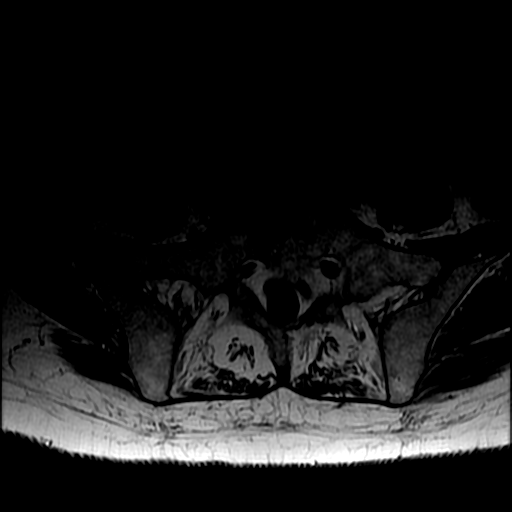
[im 20/40]
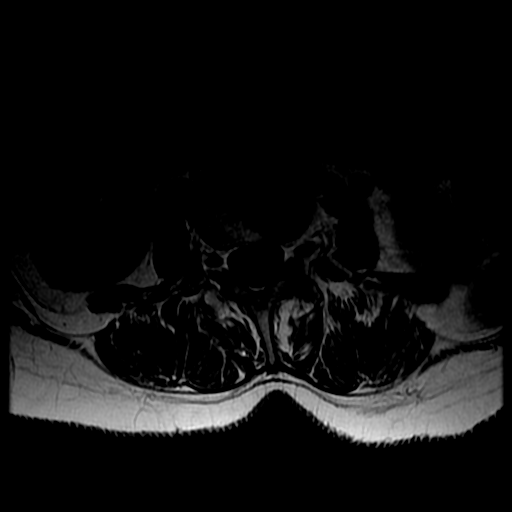
[im 36/40]
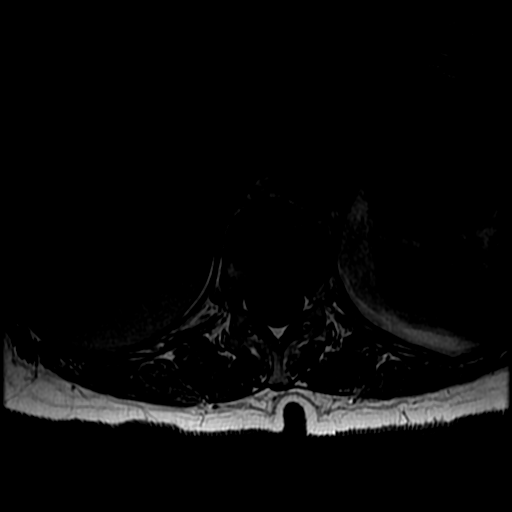

[Series 7: T2 post-contrast · sagittal · 4.0mm · 0.51mm/px · 3 of 14 slices shown]
[im 1/14]
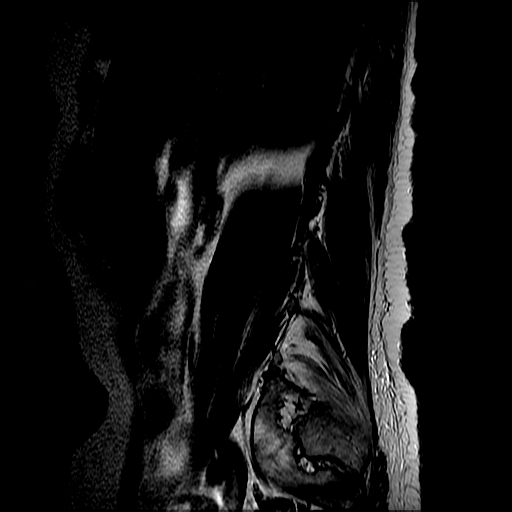
[im 9/14]
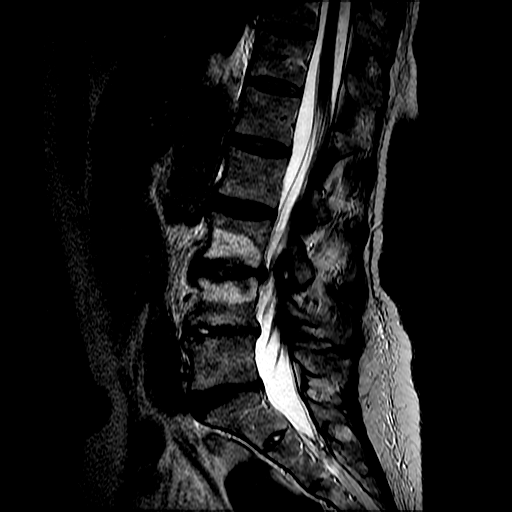
[im 14/14]
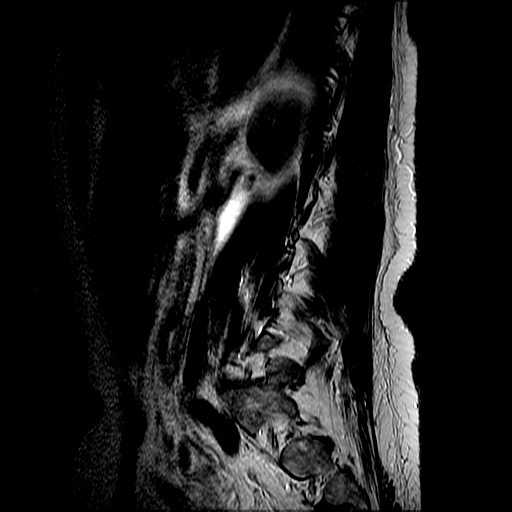

[19 of 48 positions shown; findings below may reference images not displayed]

FINDINGS: Segmentation:  Standard.

Alignment:  Mild convex right scoliosis.

Vertebrae: No fracture, evidence of discitis, or bone lesion. Signal
abnormality at the L3-4 level is due to degenerative disease. There
is no evidence of metastatic disease. Mild degenerative endplate
signal change is also seen at L4-5.

Conus medullaris and cauda equina: Conus extends to the L1-2 level.
Conus and cauda equina appear normal.

Paraspinal and other soft tissues: Left renal cyst noted. Otherwise
negative.

Disc levels:

T11-12 is imaged in the sagittal plane only and negative.

T12-L1: Negative.

L1-2: Negative.

L2-3: Negative.

L3-4: Shallow disc bulge and mild ligamentum flavum thickening. No
stenosis.

L4-5: Loss of disc space height with a very shallow bulge and
endplate spur. No stenosis.

L5-S1: Mild facet arthropathy. Shallow right paracentral protrusion.
No stenosis.
IMPRESSION: Negative for metastatic disease. Signal abnormality in L3-4 seen on
prior MRI is due to degenerative disease.

Lower lumbar degenerative disease without central canal or foraminal
narrowing.

## 2019-04-13 MED ORDER — TECHNETIUM TC 99M MEDRONATE IV KIT
21.3000 | PACK | Freq: Once | INTRAVENOUS | Status: AC
  Administered 2019-04-13: 09:00:00 21.3 via INTRAVENOUS

## 2019-04-13 MED ORDER — GADOBUTROL 1 MMOL/ML IV SOLN
6.0000 mL | Freq: Once | INTRAVENOUS | Status: AC | PRN
  Administered 2019-04-13: 6 mL via INTRAVENOUS

## 2019-04-18 ENCOUNTER — Other Ambulatory Visit: Payer: Self-pay

## 2019-04-18 ENCOUNTER — Ambulatory Visit (INDEPENDENT_AMBULATORY_CARE_PROVIDER_SITE_OTHER): Payer: Medicare HMO

## 2019-04-18 DIAGNOSIS — J309 Allergic rhinitis, unspecified: Secondary | ICD-10-CM

## 2019-04-25 ENCOUNTER — Encounter: Payer: Self-pay | Admitting: Nurse Practitioner

## 2019-04-27 ENCOUNTER — Telehealth: Payer: Self-pay | Admitting: Family Medicine

## 2019-04-27 NOTE — Telephone Encounter (Signed)
Please see message below and advise, thanks.

## 2019-04-27 NOTE — Telephone Encounter (Signed)
Julia [name] to get her appointment rescheduled, and she asked if someone could give her a call to help her understand her MRI that she had done in December, she also wanted to know if it is safe to have the mammogram after she just had the MRI of her breast. She says if she does not answer the first time to try one more time because her phone has been messing up.

## 2019-04-28 ENCOUNTER — Telehealth: Payer: Self-pay

## 2019-04-28 NOTE — Telephone Encounter (Signed)
Called and reviewed all information will call if any more questions.

## 2019-04-28 NOTE — Telephone Encounter (Signed)
Left voice message for patient answering her question regarding Bone Scan result.  Dr. Burr Medico reviewed the scan and feels that the single focus of tracer accumulation at the posterior right eight rib is probably of no concern.  Could indicate old fib fracture.  We will continue to monitor her and repeat the scan in the future.  Also if it is time for annual mammogram go ahead and proceed with this as scheduled on 2/1.  Encouraged her to call back if she has any questions.

## 2019-04-28 NOTE — Telephone Encounter (Signed)
pt states she has a virtual appt between 12pm-1pm please call after that tine

## 2019-04-28 NOTE — Telephone Encounter (Signed)
Left message to return call to our office.  

## 2019-04-28 NOTE — Telephone Encounter (Signed)
Patient returning phone call 

## 2019-04-28 NOTE — Telephone Encounter (Signed)
Reviewed MRI's results.  Her f/u MRI of her back was negative for spreading cancers. The abnormality was due to arthritic changes only. This is good news.  Yes, she needs to have her mammogram; this is in addition to the recent breast MRI.   Thanks, Dr. Jonni Sanger

## 2019-05-01 ENCOUNTER — Other Ambulatory Visit: Payer: Self-pay

## 2019-05-01 ENCOUNTER — Ambulatory Visit
Admission: RE | Admit: 2019-05-01 | Discharge: 2019-05-01 | Disposition: A | Discharge status: Home / Self Care | Payer: Medicare HMO | Source: Ambulatory Visit | Attending: Family Medicine | Admitting: Family Medicine

## 2019-05-01 DIAGNOSIS — Z1231 Encounter for screening mammogram for malignant neoplasm of breast: Secondary | ICD-10-CM

## 2019-05-01 IMAGING — MG DIGITAL SCREENING BILAT W/ TOMO W/ CAD
8 series · 9 of 24 positions shown · non-contrast
Comparison: Previous exam(s).

CLINICAL DATA: Screening.

EXAM:
DIGITAL SCREENING BILATERAL MAMMOGRAM WITH TOMO AND CAD

[R CC synth-2D]
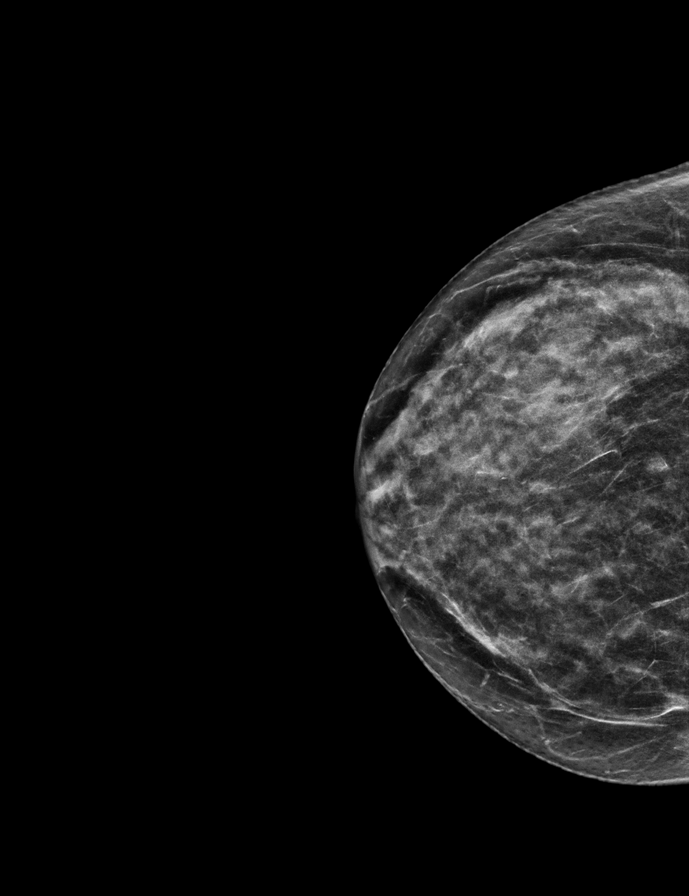

[L CC synth-2D]
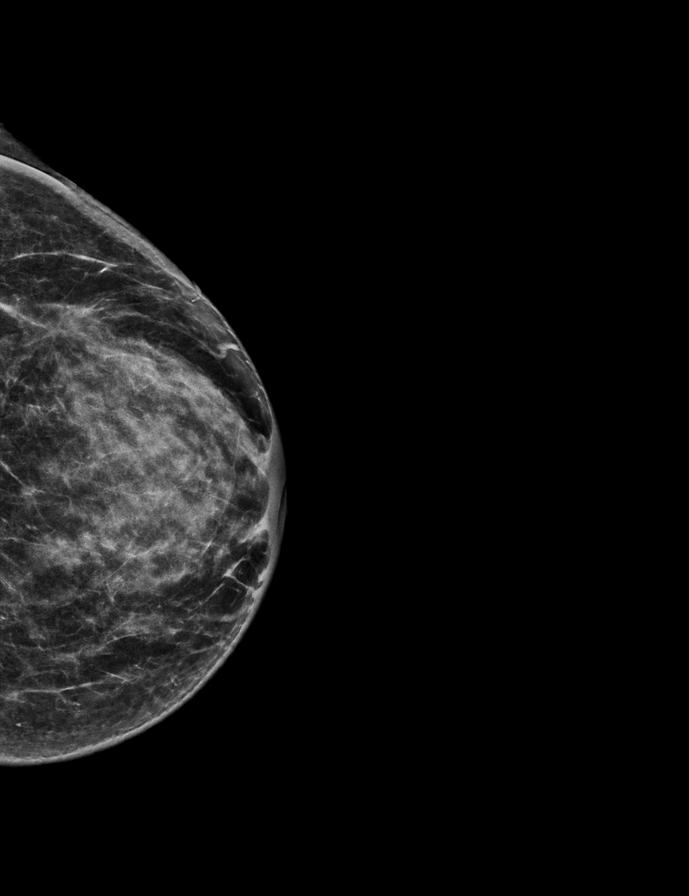

[L MLO synth-2D]
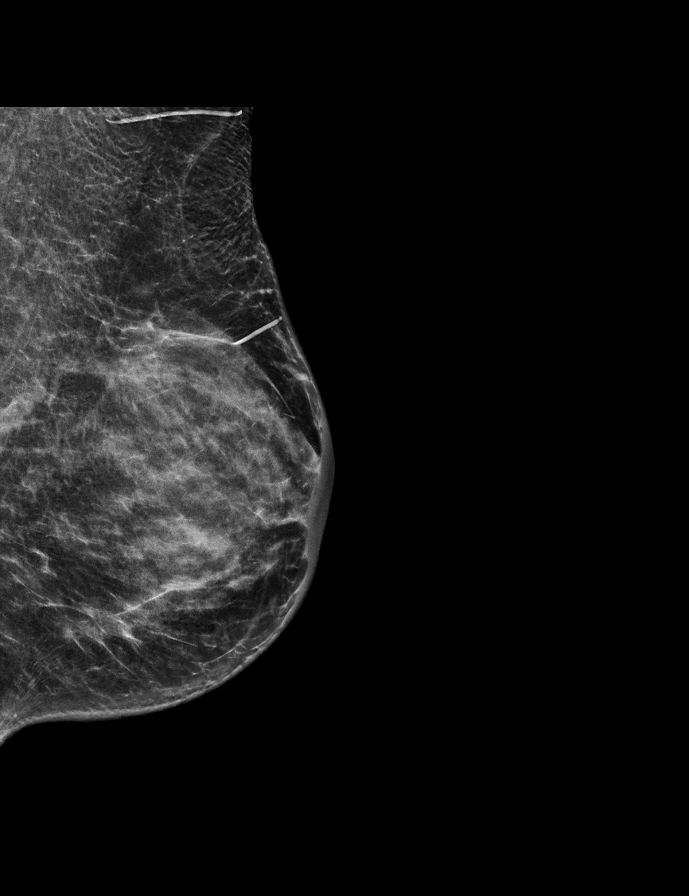

[R MLO synth-2D]
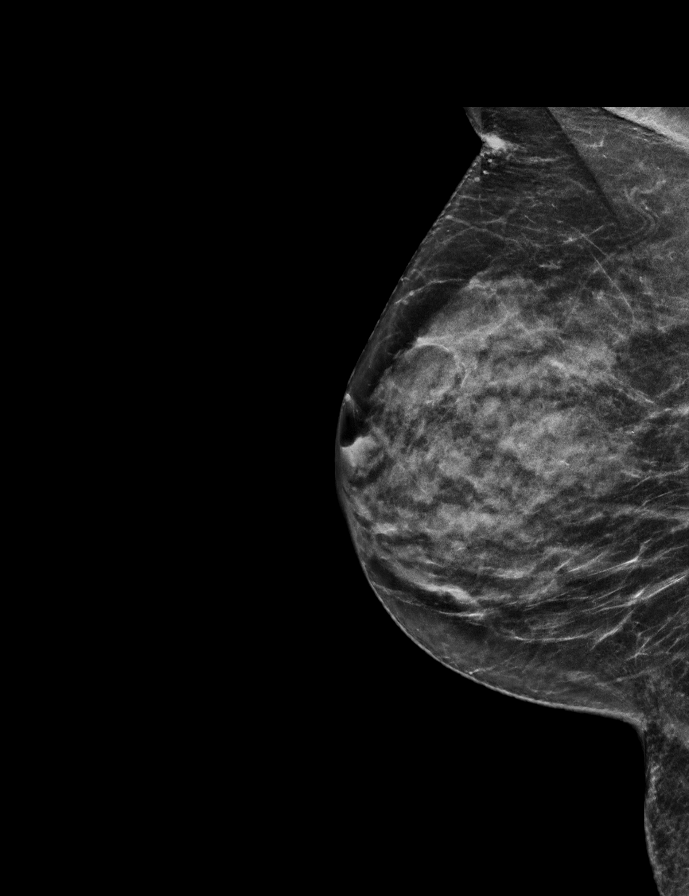

[R MLO tomo · 2 of 56 frames shown]
[frame 19/56]
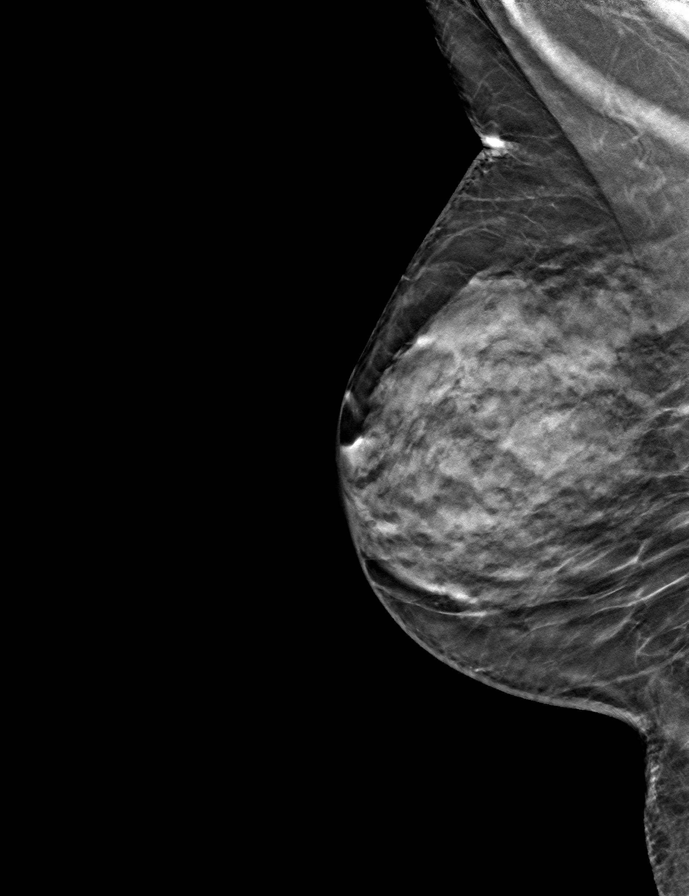
[frame 29/56]
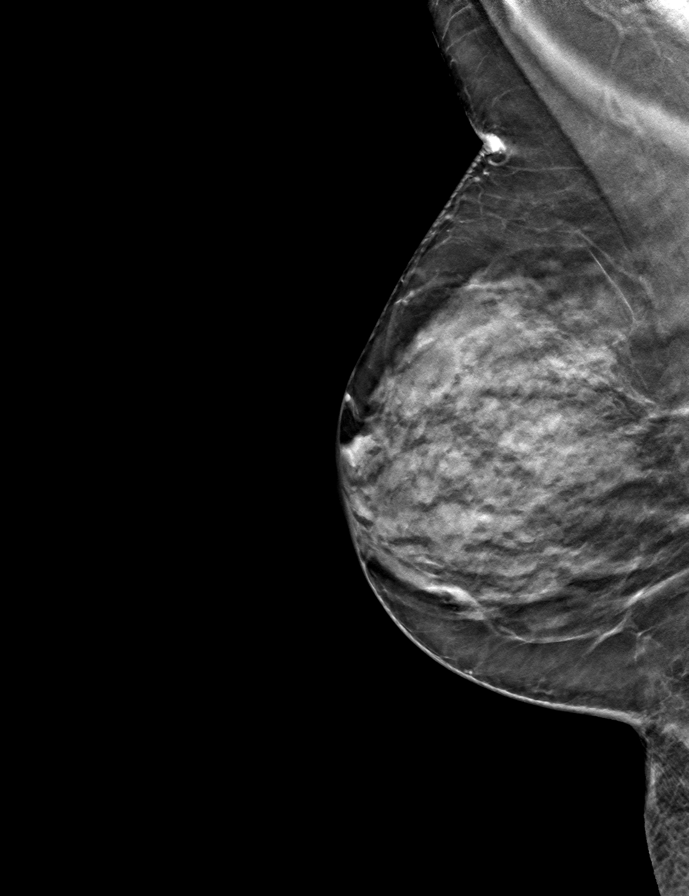

[L CC tomo · tomo slice 28/55.0]
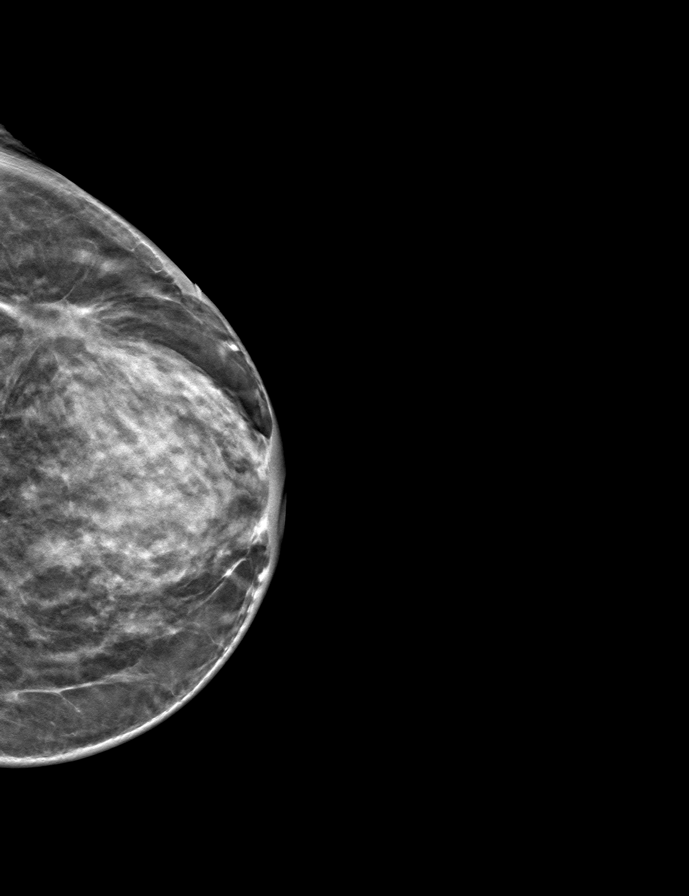

[R CC tomo · tomo slice 27/52.0]
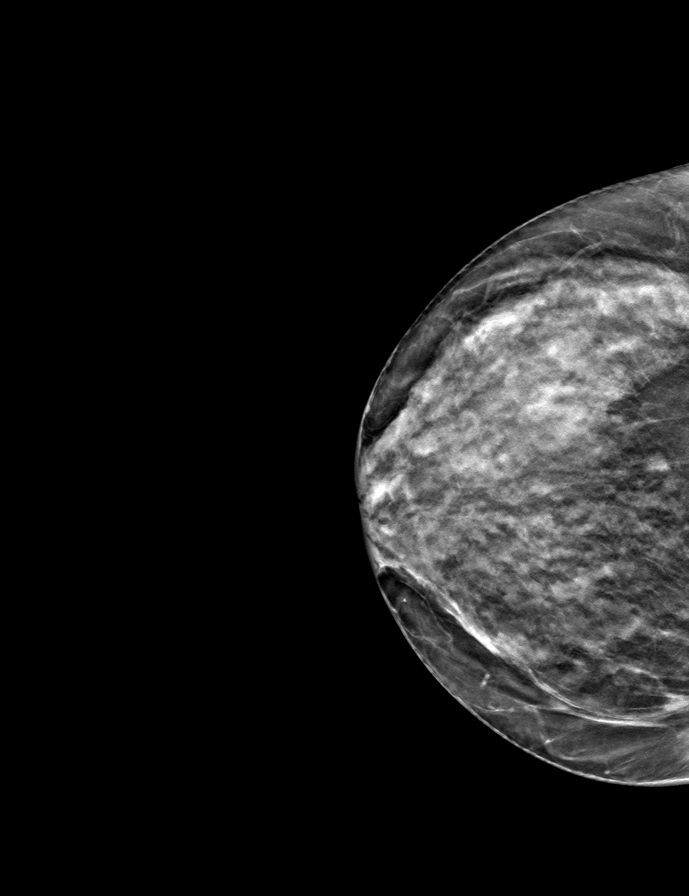

[L MLO tomo · tomo slice 31/61.0]
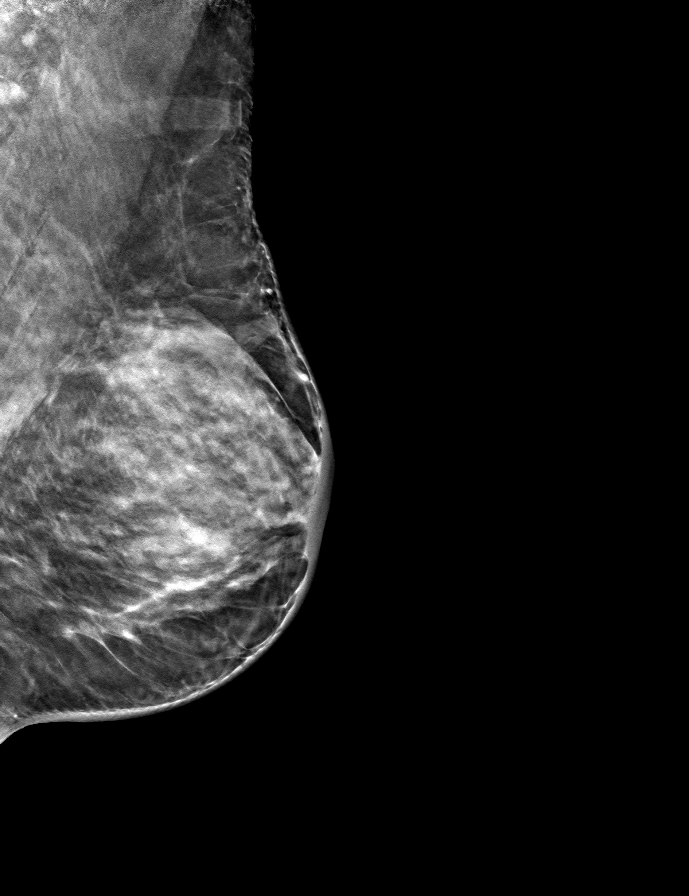

[9 of 24 positions shown; findings below may reference images not displayed]

ACR Breast Density Category c: The breast tissue is heterogeneously
dense, which may obscure small masses.
FINDINGS: There are no findings suspicious for malignancy. Images were
processed with CAD.
IMPRESSION: No mammographic evidence of malignancy. A result letter of this
screening mammogram will be mailed directly to the patient.

RECOMMENDATION:
Screening mammogram in one year. (Code:[5V])

BI-RADS CATEGORY  1: Negative.

## 2019-05-02 ENCOUNTER — Other Ambulatory Visit: Payer: Self-pay

## 2019-05-02 ENCOUNTER — Ambulatory Visit (INDEPENDENT_AMBULATORY_CARE_PROVIDER_SITE_OTHER): Payer: Medicare HMO

## 2019-05-02 DIAGNOSIS — J309 Allergic rhinitis, unspecified: Secondary | ICD-10-CM | POA: Diagnosis not present

## 2019-05-04 ENCOUNTER — Ambulatory Visit: Payer: Medicare HMO | Admitting: Family Medicine

## 2019-05-09 ENCOUNTER — Other Ambulatory Visit: Payer: Self-pay

## 2019-05-09 ENCOUNTER — Encounter: Payer: Self-pay | Admitting: Family Medicine

## 2019-05-09 ENCOUNTER — Ambulatory Visit (INDEPENDENT_AMBULATORY_CARE_PROVIDER_SITE_OTHER): Payer: Medicare HMO | Admitting: Family Medicine

## 2019-05-09 VITALS — BP 112/70 | HR 95 | Temp 98.4°F | Ht 64.5 in | Wt 141.8 lb

## 2019-05-09 DIAGNOSIS — Z85038 Personal history of other malignant neoplasm of large intestine: Secondary | ICD-10-CM

## 2019-05-09 DIAGNOSIS — L84 Corns and callosities: Secondary | ICD-10-CM | POA: Diagnosis not present

## 2019-05-09 DIAGNOSIS — G620 Drug-induced polyneuropathy: Secondary | ICD-10-CM | POA: Diagnosis not present

## 2019-05-09 DIAGNOSIS — Z853 Personal history of malignant neoplasm of breast: Secondary | ICD-10-CM

## 2019-05-09 DIAGNOSIS — G20 Parkinson's disease: Secondary | ICD-10-CM

## 2019-05-09 DIAGNOSIS — G2581 Restless legs syndrome: Secondary | ICD-10-CM

## 2019-05-09 DIAGNOSIS — M810 Age-related osteoporosis without current pathological fracture: Secondary | ICD-10-CM

## 2019-05-09 NOTE — Progress Notes (Signed)
Subjective  CC:  Chief Complaint  Patient presents with  . Foot scraping    callus on both feet    HPI: Thereas [name] is a 79 y.o. female who presents to the office today to address the problems listed above in the chief complaint.  Osteoporosis: due for prolia injection today;however received 2nd moderna covid 19 vaccine yesterday and is feeling achy and fatigued.   Foot calluses w/ peripheral neuropathy: needs paring down as they are painful. No redness or drainage.  Toenail care: can't feel her toes and worries she will cut herself.   Neuropathy: pain at night along w/ RLS; failed gabapentin again: reports "makes me go to the bathroom and gives me nightmares". She declines trial of lyrica at this time.   Parkinson's: nl MMSE w/ neuro recently. Mild movement issues. Stable. On meds.   Early satiety and h/o bowel issues: reviewed GI notes from 07/2018: multifactorial in nature. Pt had virtual visit w/o follow up. C/o small hard stools, occ urgency sxs, and early satiety.   Breast cancer and colon cancer history: reviewed recent oncology evaluation; had multiple scans: clear and will have f/u.   Assessment  1. Drug-induced polyneuropathy ([place])   2. Foot callus   3. 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 History of colon cancer   5. RLS (restless legs syndrome)   6. Parkinson's disease ([place])   7. History of breast cancer      Plan   neuropathy:  Symptomatic. Pt elects monitoring for now. Had recent labs per neuro: no secondary etiology identified.   Callus and toenail care; s/p paring down calluses today. Tolerated well. Refer to podiatry for toenail care given neuropathy/numbness  Osteoporosis: to reschedule prolia injection for 2 weeks to ensure no complications or side effects from moderna vaccine.   Oncology: surveillance ongoing. On breast cancer meds.   parkinsons and rls per neuro.  Gi concerns: recommend scheduling a follow up visit  to see if further pelvic floor training or meds could be again useful.   Follow up: No follow-ups on file.  10/17/2019  Orders Placed This Encounter  Procedures  . Ambulatory referral to Podiatry   No orders of the defined types were placed in this encounter.     I reviewed the patients updated PMH, FH, and SocHx.    Patient Active Problem List   Diagnosis Date Noted  . Statin intolerance 10/13/2018    Priority: High  . Age-related osteoporosis without current pathological fracture 07/20/2016    Priority: High  . Hyperlipidemia 03/07/2015    Priority: High  . Impaired fasting glucose 03/07/2015    Priority: High  . History of colon cancer 02/13/2015    Priority: High  . Hypothyroidism due to Hashimoto's thyroiditis 02/13/2015    Priority: High  . Malignant neoplasm of female breast ([place]) 02/13/2015    Priority: High  . RLS (restless legs syndrome) 07/14/2018    Priority: Medium  . 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 Anxiety 09/29/2017    Priority: Medium  . Parkinson's disease ([place]) 09/29/2017    Priority: Medium  . Dysfunctional voiding of urine 09/13/2016    Priority: Medium  . Female stress incontinence 09/13/2016    Priority: Medium  . 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 Gastroesophageal reflux disease 09/13/2016    Priority: Medium  . Drug-induced polyneuropathy ([employer]) 03/07/2015    Priority: Medium  . Irritable bowel  syndrome with both constipation and diarrhea 02/13/2015    Priority: Medium  . Perennial and seasonal allergic rhinitis 02/01/2018    Priority: Low  . Atrophic vaginitis 02/13/2015    Priority: Low  . Recurrent UTI 02/13/2015    Priority: Low  . Vitamin D deficiency disease 02/13/2015    Priority: Low  . Pelvic floor dysfunction 08/26/2018  . Fecal smearing 08/26/2018  . Fecal urgency 08/26/2018  . Female bladder prolapse 04/07/2018  . 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 Calcium Citrate-Vitamin D 315-250 MG-UNIT TABS Take by mouth.  . Carbidopa-Levodopa ER (SINEMET CR) 25-100 MG tablet controlled release TAKE 1 TABLET BY MOUTH FOUR TIMES A DAY  . denosumab (PROLIA) 60 MG/ML SOSY injection Inject 60 mg into the skin every 6 (six) months.  . EPINEPHRINE 0.3 mg/0.3 mL IJ SOAJ injection INJECT INTO THE MIDDLE OF THE OUTER THIGH AND HOLD FOR 3 SECONDS AS NEEDED FOR SEVERE ALLERGIC REACTION THEN CALL 911 IF USED.  Marland Kitchen Fexofenadine HCl (ALLEGRA ALLERGY PO) Take by mouth.  . fluticasone (FLONASE) 50 MCG/ACT nasal spray SPRAY TWO SPRAYS IN EACH NOSTRIL ONCE DAILY AS NEEDED  . levothyroxine (SYNTHROID) 88 MCG tablet TAKE ONE TABLET BY MOUTH DAILY BEFORE BREAKFAST  . LORazepam (ATIVAN) 0.5 MG tablet Take 1 tablet (0.5 mg total) by mouth once as needed for anxiety. Take 1 hour before MRI  . Multiple Vitamins-Minerals (CENTRUM SILVER PO) Take by mouth.  . Probiotic Product (PROBIOTIC DAILY PO) Take by mouth.  . tamoxifen (NOLVADEX) 20 MG tablet Take 20 mg by mouth daily.  Marland Kitchen triamcinolone cream (KENALOG) 0.1 % Apply 1 application topically 2 (two) times daily.  Marland Kitchen UNABLE TO FIND Med Name: Allergy shots 1/week    Allergies: Patient is allergic to cefdinir; cholestyramine; contrast media [iodinated diagnostic agents]; ezetimibe; meperidine; nitrofurantoin; statins; and teriparatide. Family History: Patient family history includes Arthritis in her father, mother, and sister; Cancer in her brother, father, sister, sister, and sister; Depression in her father; Diabetes in her mother and sister; Heart disease in her mother; Hyperlipidemia in her brother and sister; Hypertension in her mother; Osteoporosis in her father. Social History:  Patient  reports that she has never smoked. She has never used smokeless tobacco. She reports current alcohol use. She reports that she does not use drugs.  Review of Systems: Constitutional: Negative  for fever malaise or anorexia Cardiovascular: negative for chest pain Respiratory: negative for SOB or persistent cough Gastrointestinal: negative for abdominal pain  Objective  Vitals: BP 112/70 (BP Location: Left Arm, Patient Position: Sitting, Cuff Size: Normal)   Pulse 95   Temp 98.4 F (36.9 C) (Temporal)   Ht 5' 4.5" (1.638 m)   Wt 141 lb 12.8 oz (64.3 kg)   SpO2 95%   BMI 23.96 kg/m  General: no acute distress , A&Ox3 HEENT: PEERL, conjunctiva normal, Oropharynx moist,neck is supple Cardiovascular:  RRR without murmur or gallop.  Respiratory:  Good breath sounds bilaterally, CTAB with normal respiratory effort Skin:  Warm, no rashes Feet: bilateral callus tender over 3rd metatarsal heads  Procedure Note: Paring Skin Callus: foot  Indication: pain  Verbal consent obtained.  Alcohol swab used for cleansing area of callus.  Using an #15 scalpel, the calluses x 2 was bluntly pared down, core was removed. There were no complications. Pt tolerated procedure well and felt improved afterwards.      Commons side effects, risks, benefits, and alternatives for  medications and treatment plan prescribed today were discussed, and the patient expressed understanding of the given instructions. Patient is instructed to call or message via MyChart if he/she has any questions or concerns regarding our treatment plan. No barriers to understanding were identified. We discussed Red Flag symptoms and signs in detail. Patient expressed understanding regarding what to do in case of urgent or emergency type symptoms.   Medication list was reconciled, printed and provided to the patient in AVS. Patient instructions and summary information was reviewed with the patient as documented in the AVS. This note was prepared with assistance of Dragon voice recognition software. Occasional wrong-word or sound-a-like substitutions may have occurred due to the inherent limitations of voice recognition  software  This visit occurred during the SARS-CoV-2 public health emergency.  Safety protocols were in place, including screening questions prior to the visit, additional usage of staff PPE, and extensive cleaning of exam room while observing appropriate contact time as indicated for disinfecting solutions.

## 2019-05-09 NOTE — Patient Instructions (Signed)
Please return in July 2021 for your annual complete physical; please come fasting.  I have made a referral to podiatry for further foot and toenail care.   If you have any questions or concerns, please don't hesitate to send me a message via MyChart or call the office at [phone]. Thank you for visiting with [place] today! It's our pleasure caring for you.  Please call Dr. Rush Landmark for further evaluation regarding your bowels and stomach concerns. You last saw him in may 2020: [phone]

## 2019-05-16 ENCOUNTER — Ambulatory Visit (INDEPENDENT_AMBULATORY_CARE_PROVIDER_SITE_OTHER): Payer: Medicare HMO

## 2019-05-16 ENCOUNTER — Other Ambulatory Visit: Payer: Self-pay

## 2019-05-16 DIAGNOSIS — J309 Allergic rhinitis, unspecified: Secondary | ICD-10-CM

## 2019-05-23 ENCOUNTER — Other Ambulatory Visit: Payer: Self-pay

## 2019-05-23 ENCOUNTER — Ambulatory Visit: Payer: Medicare HMO

## 2019-05-29 ENCOUNTER — Ambulatory Visit: Payer: Medicare HMO | Admitting: Neurology

## 2019-05-29 ENCOUNTER — Encounter: Payer: Self-pay | Admitting: Neurology

## 2019-05-29 ENCOUNTER — Other Ambulatory Visit: Payer: Self-pay

## 2019-05-29 VITALS — BP 111/77 | HR 88 | Ht 64.75 in | Wt 140.0 lb

## 2019-05-29 DIAGNOSIS — G20 Parkinson's disease: Secondary | ICD-10-CM

## 2019-05-29 DIAGNOSIS — N952 Postmenopausal atrophic vaginitis: Secondary | ICD-10-CM

## 2019-05-29 NOTE — Progress Notes (Signed)
Reason for visit: Parkinson's disease, memory disturbance, peripheral neuropathy  Dalena Graci is an 79 y.o. female  History of present illness:  Ms. Rasmusson is a 79 year old right-handed white female with a history of Parkinson's disease.  The patient reports tremors mainly on the left side of the body, but the tremors are not severe.  She mainly notes problems when she is trying to get to sleep at night, her legs are now involved with the tremors as well.  She also reports nocturnal leg cramps affecting the calf muscles and she has to get up immediately and stretch.  She is having troubles getting back to sleep when she gets up at 3 or 4 in the morning to use the bathroom, and she cannot get in for the rest.  She remains fatigued during the day.  The patient is having some discomfort in the feet, she has tried gabapentin previously but did not do well with this.  She is reporting some underlying depression and anxiety.  She has not had any falls, she denies issues with keeping up with day-to-day activities.  Past Medical History:  Diagnosis Date  . Arthritis   . Breast cancer ([place])   . Colon cancer ([place])    previous colon cancer  . Hyperlipidemia   . 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 RLS (restless legs syndrome) 07/14/2018  . 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 ADENOIDECTOMY    . BREAST LUMPECTOMY Left    radiation only  . BUNIONECTOMY    . COLON SURGERY    . HEMORRHOID SURGERY    . LAPAROSCOPY    . NASAL SINUS SURGERY    . OOPHORECTOMY    . TONSILLECTOMY    . TUBAL LIGATION      Family History  Problem Relation Age of Onset  . Arthritis Mother   . Diabetes Mother   . Heart disease Mother   . Hypertension Mother   . Arthritis Father   . Cancer Father   . Depression Father   . Osteoporosis Father   . Arthritis Sister   . Cancer Sister   . Diabetes Sister   . Cancer Brother   .  Cancer Sister   . Cancer Sister   . Hyperlipidemia Sister   . Hyperlipidemia Brother     Social history:  reports that she has never smoked. She has never used smokeless tobacco. She reports current alcohol use. She reports that she does not use drugs.    Allergies  Allergen Reactions  . Cefdinir   . Cholestyramine     GERD  . Contrast Media [Iodinated Diagnostic Agents] Diarrhea  . Ezetimibe   . Meperidine   . Nitrofurantoin Diarrhea  . Statins   . Teriparatide     Medications:  Prior to Admission medications   Medication Sig Start Date End Date Taking? Authorizing Provider  Calcium Citrate-Vitamin D 315-250 MG-UNIT TABS Take by mouth.   Yes [provider]  Carbidopa-Levodopa ER (SINEMET CR) 25-100 MG tablet controlled release TAKE 1 TABLET BY MOUTH FOUR TIMES A DAY 01/05/19  Yes Kathrynn Ducking, MD  denosumab (PROLIA) 60 MG/ML SOSY injection Inject 60 mg into the skin every 6 (six) months.   Yes [provider]  EPINEPHRINE 0.3 mg/0.3 mL IJ SOAJ injection INJECT INTO THE MIDDLE OF THE OUTER THIGH AND HOLD FOR 3 SECONDS AS NEEDED FOR SEVERE ALLERGIC REACTION THEN CALL  911 IF USED. 02/14/19  Yes Bobbitt, Sed[place] Muta, MD  Fexofenadine HCl [facility] ALLERGY PO) Take by mouth.   Yes [provider]  fluticasone (FLONASE) 50 MCG/ACT nasal spray SPRAY TWO SPRAYS IN EACH NOSTRIL ONCE DAILY AS NEEDED 10/14/18  Yes Bobbitt, [place] Muta, MD  levothyroxine (SYNTHROID) 88 MCG tablet TAKE ONE TABLET BY MOUTH DAILY BEFORE BREAKFAST 01/16/19  Yes Leamon Arnt, MD  LORazepam (ATIVAN) 0.5 MG tablet Take 1 tablet (0.5 mg total) by mouth once as needed for anxiety. Take 1 hour before MRI 03/29/19  Yes Alla Feeling, NP  Multiple Vitamins-Minerals (CENTRUM SILVER PO) Take by mouth.   Yes [provider]  Probiotic Product (PROBIOTIC DAILY PO) Take by mouth.   Yes [provider]  tamoxifen (NOLVADEX) 20 MG tablet Take 20 mg by mouth daily.   Yes  [provider]  triamcinolone cream (KENALOG) 0.1 % Apply 1 application topically 2 (two) times daily. 03/22/19  Yes Inda Coke, PA  UNABLE TO FIND Med Name: Allergy shots 1/week   Yes [provider]    ROS:  Out of a complete 14 system review of symptoms, the patient complains only of the following symptoms, and all other reviewed systems are negative.  Foot discomfort Leg cramps Mild balance issues Fatigue Depression, anxiety  Blood pressure 111/77, pulse 88, height 5' 4.75" (1.645 m), weight 140 lb (63.5 kg).  Physical Exam  General: The patient is alert and cooperative at the time of the examination.  Skin: No significant peripheral edema is noted.   Neurologic Exam  Mental status: The patient is alert and oriented x 3 at the time of the examination. The Mini-Mental Status Examination done today shows a total score 27/30.   Cranial nerves: Facial symmetry is present. Speech is normal, no aphasia or dysarthria is noted. Extraocular movements are full. Visual fields are full.  Mild masking the face is seen.  Motor: The patient has good strength in all 4 extremities.  Sensory examination: Soft touch sensation is symmetric on the face, arms, and legs.  Coordination: The patient has good finger-nose-finger and heel-to-shin bilaterally.  Gait and station: The patient has a normal gait, with only a minimal decrease in arm swing on the left.  The patient is able to rise from a seated position with arms crossed. Tandem gait is normal. Romberg is negative. No drift is seen.  Reflexes: Deep tendon reflexes are symmetric.   Assessment/Plan:  1.  Parkinson's disease  2.  Peripheral neuropathy  3.  Mild memory disturbance  4.  Reports of mild depression and anxiety  The patient will try melatonin at night for sleep, if this is not helpful we will place her on Cymbalta for the neuropathy discomfort and for her depression.  The patient is barely  tolerating the Sinemet currently taking the 25/100 mg CR tablet, 1 tablet 4 times daily.  She gets some nausea about half an hour after taking the medication.  She will follow-up here in 5 months, I have encouraged her to remain active, she walks about a mile or 2 frequently.  Greater than 50% of the visit was spent in counseling and coordination of care.  Face-to-face time with the patient was 25 minutes.   Jill Alexanders MD 05/29/2019 11:16 AM  [facility] [address] [place], [place] [address]  Phone [phone] Fax [phone]

## 2019-05-30 ENCOUNTER — Ambulatory Visit (INDEPENDENT_AMBULATORY_CARE_PROVIDER_SITE_OTHER): Payer: Medicare HMO

## 2019-05-30 ENCOUNTER — Telehealth: Payer: Self-pay

## 2019-05-30 ENCOUNTER — Other Ambulatory Visit: Payer: Self-pay

## 2019-05-30 DIAGNOSIS — J309 Allergic rhinitis, unspecified: Secondary | ICD-10-CM

## 2019-05-30 NOTE — Telephone Encounter (Signed)
Patient is scheduled for 06/15/19 at 10am

## 2019-05-30 NOTE — Telephone Encounter (Signed)
Patient have questions about prolia and covid vaccine. Patient is schedule to come into the office for prolia tomorrow. Please return patient call

## 2019-05-30 NOTE — Telephone Encounter (Signed)
Patient is getting her allergy injection today and wants to know if it is ok to get her prolia shot tomorrow? Please advise.

## 2019-05-30 NOTE — Telephone Encounter (Signed)
Patient canceled appt for tomorrow

## 2019-05-30 NOTE — Telephone Encounter (Signed)
Patient schedule for prolia injection 06/08/19 at 10am. Patient states she wants to see if this prolia injection causing her hip to hurt. Patient states if so she will cancel this upcoming appt as well.

## 2019-05-30 NOTE — Telephone Encounter (Signed)
Please have her reschedule for two weeks from now for prolia

## 2019-05-30 NOTE — Telephone Encounter (Signed)
See below

## 2019-05-31 ENCOUNTER — Ambulatory Visit: Payer: Medicare HMO

## 2019-06-06 ENCOUNTER — Other Ambulatory Visit: Payer: Self-pay

## 2019-06-06 ENCOUNTER — Ambulatory Visit (INDEPENDENT_AMBULATORY_CARE_PROVIDER_SITE_OTHER): Payer: Medicare HMO

## 2019-06-06 DIAGNOSIS — J309 Allergic rhinitis, unspecified: Secondary | ICD-10-CM

## 2019-06-08 ENCOUNTER — Ambulatory Visit: Payer: Medicare HMO

## 2019-06-09 ENCOUNTER — Ambulatory Visit: Payer: Medicare HMO | Admitting: Family Medicine

## 2019-06-09 ENCOUNTER — Ambulatory Visit (INDEPENDENT_AMBULATORY_CARE_PROVIDER_SITE_OTHER): Payer: Medicare HMO

## 2019-06-09 ENCOUNTER — Telehealth: Payer: Self-pay | Admitting: Family Medicine

## 2019-06-09 ENCOUNTER — Other Ambulatory Visit: Payer: Self-pay | Admitting: Physician Assistant

## 2019-06-09 ENCOUNTER — Other Ambulatory Visit: Payer: Self-pay

## 2019-06-09 VITALS — BP 124/82 | Ht 64.0 in | Wt 142.0 lb

## 2019-06-09 DIAGNOSIS — R0781 Pleurodynia: Secondary | ICD-10-CM

## 2019-06-09 DIAGNOSIS — M549 Dorsalgia, unspecified: Secondary | ICD-10-CM

## 2019-06-09 DIAGNOSIS — S2231XA Fracture of one rib, right side, initial encounter for closed fracture: Secondary | ICD-10-CM

## 2019-06-09 IMAGING — DX DG RIBS W/ CHEST 3+V*R*
3 series · 3 of 3 positions shown · non-contrast
Comparison: Whole-body bone scan dated [DATE]

CLINICAL DATA: Right posterior rib/back pain.:/breast cancer with
metastases. Right posterior 8th rib lesion on bone scan.

EXAM:
RIGHT RIBS AND CHEST - 3+ VIEW

[chest pa]
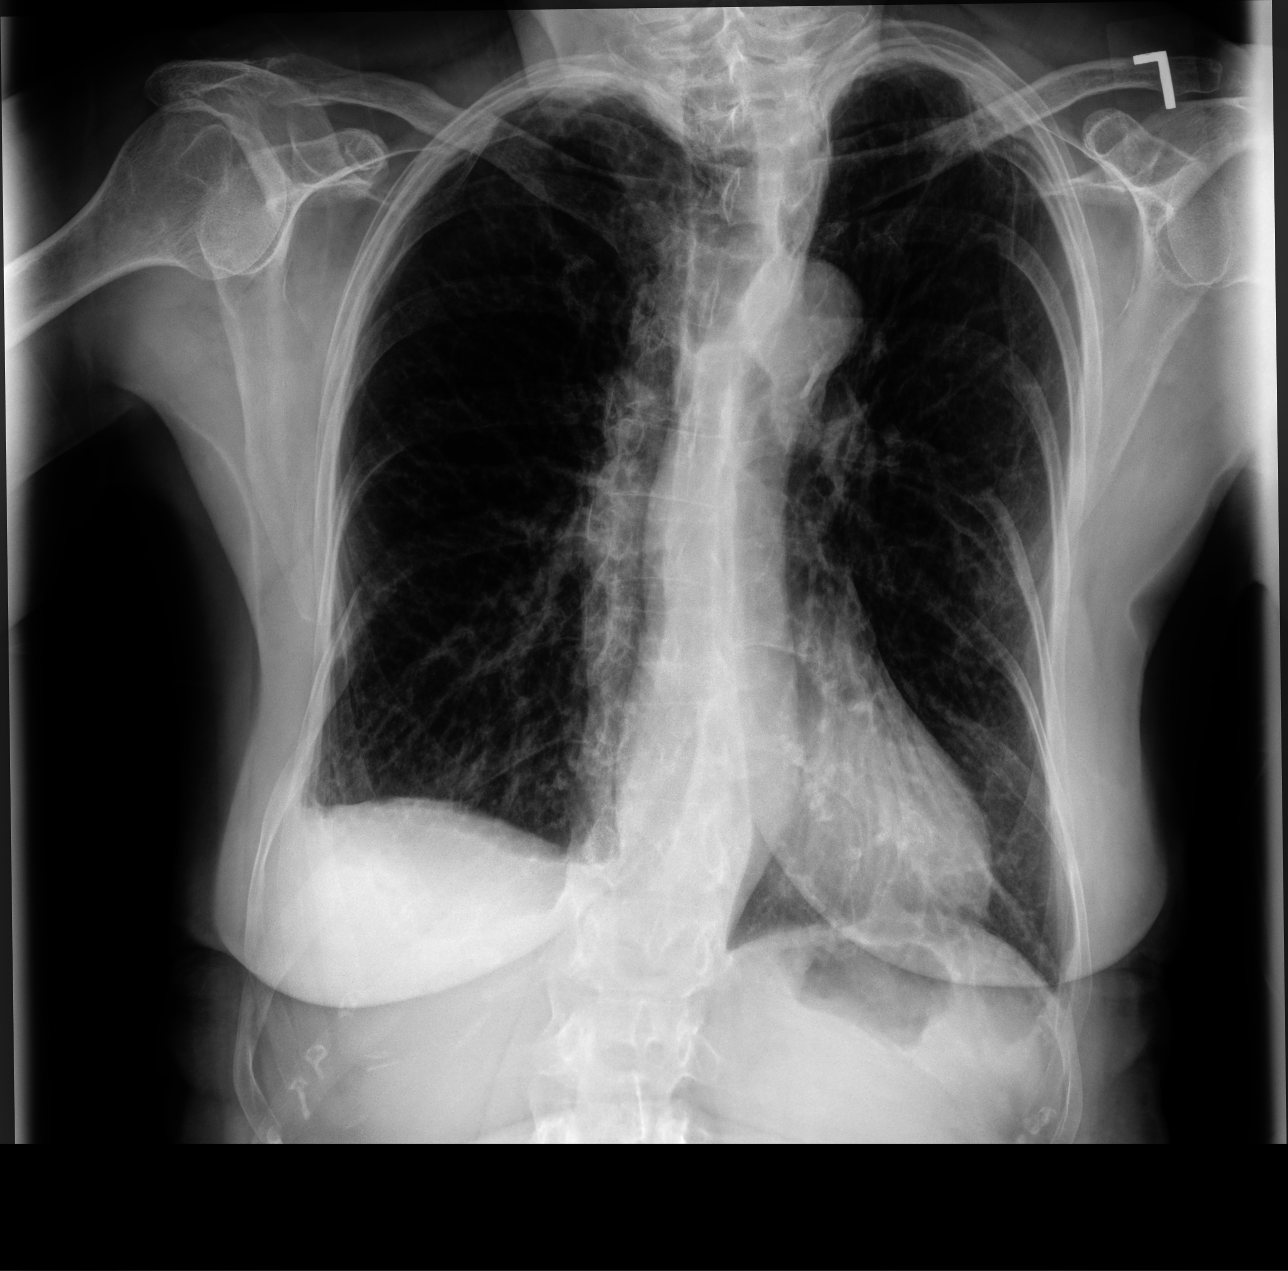

[hemithorax (ribs) ap]
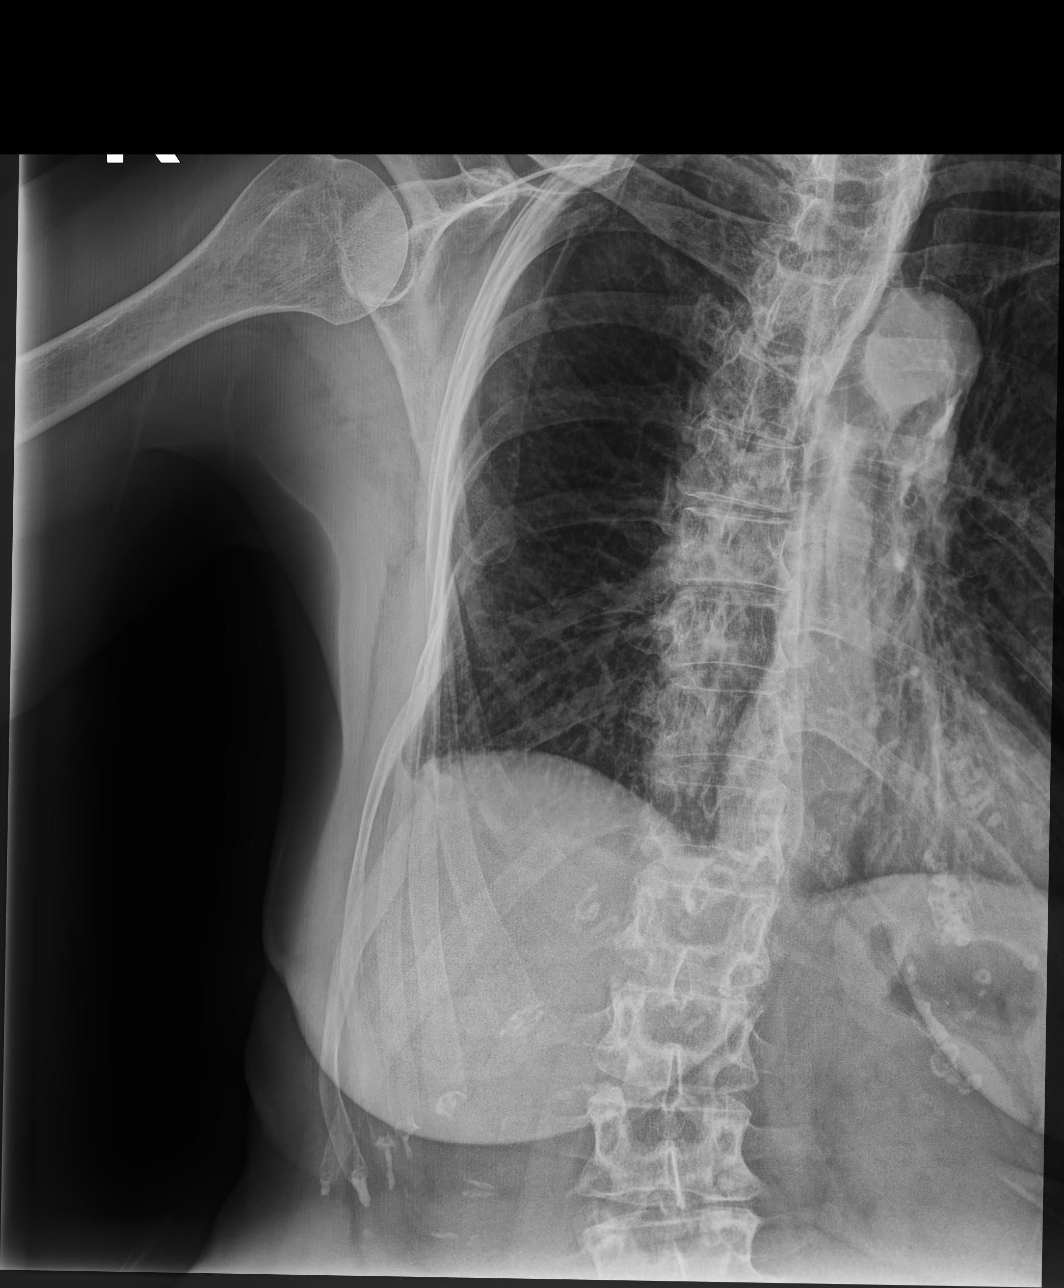

[hemithorax (ribs) mlo]
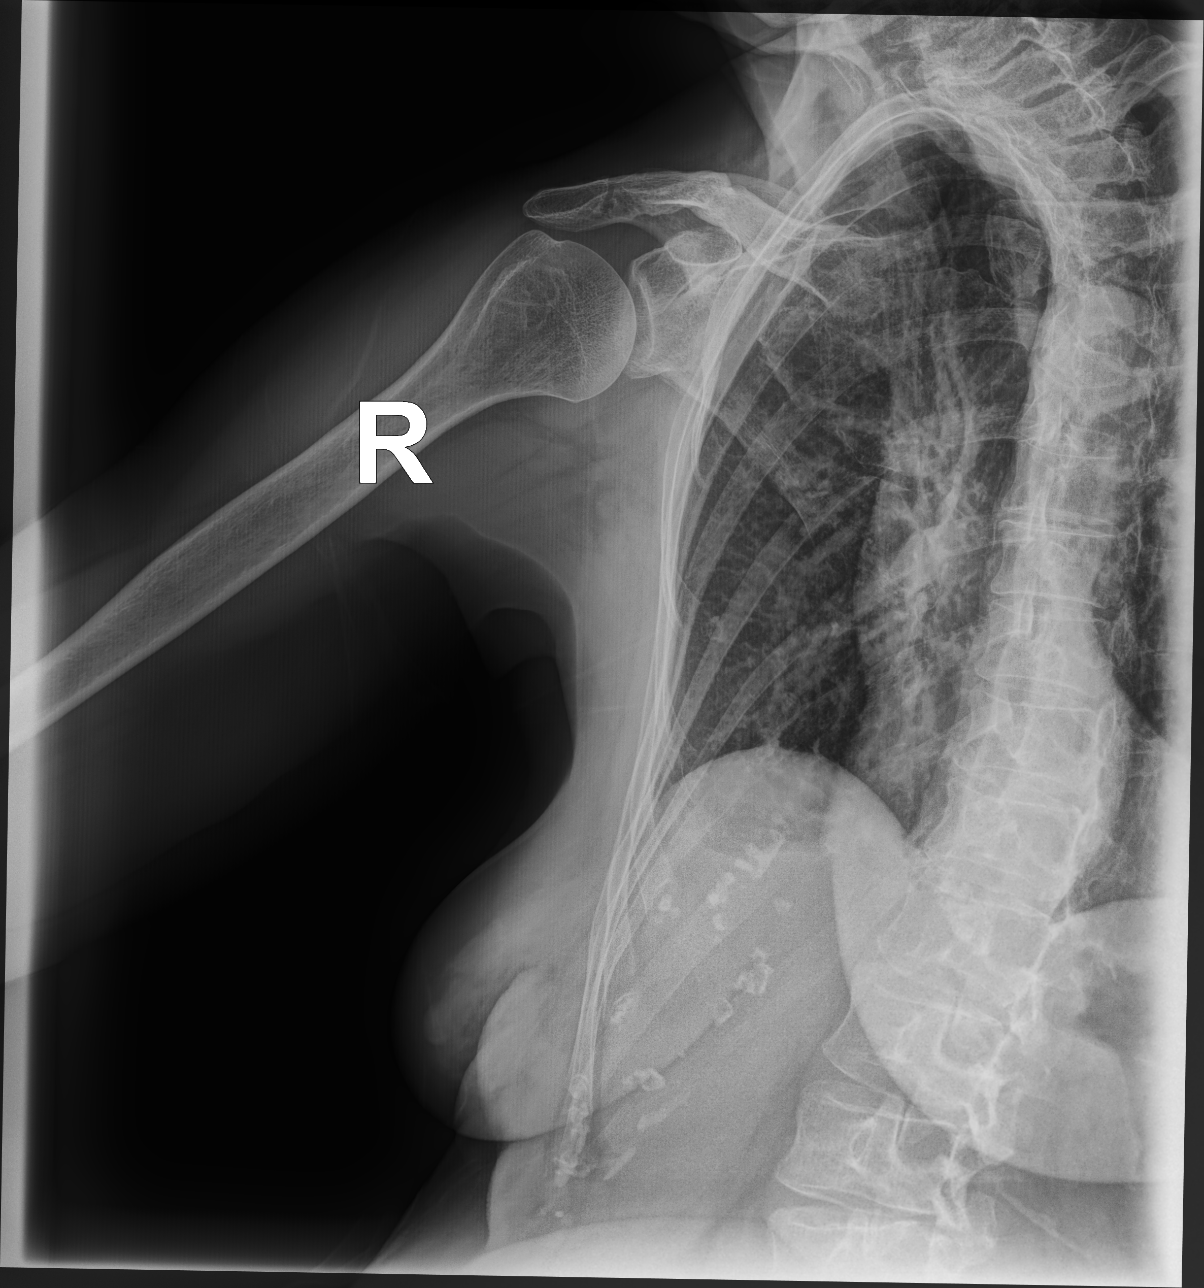

[3 of 3 positions shown; findings below may reference images not displayed]

FINDINGS: Suspected nondisplaced right posterior 8th rib fracture. Correlate
for point tenderness.

No focal osseous lesions.

Lungs are clear.  Trace right pleural effusion.  No pneumothorax.

The heart is normal in size.
IMPRESSION: Suspected nondisplaced right posterior 8th rib fracture. Correlate
for point tenderness.

No focal osseous lesions.

Trace right pleural effusion.

## 2019-06-09 MED ORDER — TRAMADOL HCL 50 MG PO TABS: 50.0000 mg | ORAL_TABLET | Freq: Three times a day (TID) | ORAL | 0 refills | Status: DC | PRN

## 2019-06-09 NOTE — Patient Instructions (Addendum)
Thank you for coming in today.  Use a rib binder/belt.  Try heat.  Keep me updated.  If not improving next week let me know.  We may do PT or proceed with further imaging such as CT scan without contrast.

## 2019-06-09 NOTE — Progress Notes (Signed)
Radiology thinks there is a rib fracture there as well. Plan on CT scan of the chest this upcoming week.

## 2019-06-09 NOTE — Progress Notes (Signed)
Patient called for an appointment for severe back pain.  Offered appointment with [name] in sports medicine as he has earlier availability.  Patient agreeable.  Referral placed for completion.   Patient was not seen by me, no charge.  Inda Coke

## 2019-06-09 NOTE — Telephone Encounter (Signed)
Rib fx on Xray. Bone scan in Jan showed uptake. Hx cancer. Plan CT scan chest

## 2019-06-09 NOTE — Progress Notes (Signed)
Subjective:    CC: Low back pain  I, Julia [name], LAT, ATC, am serving as scribe for [name].  HPI: Pt is a 79 y/o female presenting w/ c/o severe thoracic and lower back pain. Was rolling up from a chest press bench. Placed weights on chest and sat up.  She rates her pain at a 9/10 and describes her pain as sharp. Pain occurring inferior to right scapula but can radiate into right breast. Has been using ice, heat, and Aleve. Patient notes that she does have Parkinson's disease.  Also history of breast and colon cancer.  Radiating pain: No pain radiating down arms or legs.  Some pain radiating around the right anterior chest wall. LE numbness/tingling: No LE weakness: No Aggravating factors: Worse with activity and deep inspiration. Treatments tried: Heat ibuprofen Tylenol and ice  Pertinent review of Systems: Fevers or chills.  No trouble breathing or cough.  Relevant historical information: History of breast and colon cancer   Objective:    Vitals:   06/09/19 1005  BP: 124/82   General: Well Developed, well nourished, and in no acute distress.   MSK: T and L-spine nontender to spinal midline.  Mildly tender palpation right thoracolumbar paraspinal musculature. Decreased lumbar thoracic motion. Tender palpation around right inferior posterior to lateral chest wall. Lungs normal symmetrical expansion bilaterally  Lab and Radiology Results  X-ray images chest and right ribs obtained today personally independent reviewed Possible fracture versus overlying lung field at right inferior posterior rib margin.  Uncertain if true fracture. Await formal radiology review    EXAM: NUCLEAR MEDICINE WHOLE BODY BONE SCAN  TECHNIQUE: Whole body anterior and posterior images were obtained approximately 3 hours after intravenous injection of radiopharmaceutical.  RADIOPHARMACEUTICALS:  21.3 mCi Technetium-32m MDP IV  COMPARISON:  None  Imaging correlation: MRI  lumbar spine 04/13/2019  FINDINGS: Minimal uptake at knees typically degenerative.  Dextroconvex lumbar scoliosis with mild degenerative changes along the concave border of the LEFT lumbar spine likely degenerative.  Minimal abnormal tracer uptake at the posterior RIGHT eighth rib, nonspecific.  No additional worrisome sites of abnormal osseous tracer accumulation are seen.  Expected urinary tract and soft tissue distribution of tracer.  IMPRESSION: Single focus of abnormal osseous tracer accumulation at the posterior RIGHT eighth rib, nonspecific; radiographic correlation recommended.  No other worrisome scintigraphic abnormalities.   Electronically Signed   By: Lavonia Dana M.D.   On: 04/13/2019 13:31  I, Lynne Leader, personally (independently) visualized and performed the interpretation of the images attached in this note.    Impression and Recommendations:    Assessment and Plan: 79 y.o. female with  Right lateral thoracolumbar pain and right lateral rib pain.  Concern for muscle strain versus rib fracture.  Patient does have a history of cancer and did have uptake on bone scan in this region in January.  X-ray today is difficult to interpret.  Patient has lots of changes lung field markings interfering with observation of rib per my interpretation.  Formal radiology review is still pending.  Patient will check back later this upcoming week to proceed with next steps possibly physical therapy or CT scan.Marland Kitchen  PDMP reviewed during this encounter. Orders Placed This Encounter  Procedures  . DG Ribs Unilateral W/Chest Right    Standing Status:   Future    Number of Occurrences:   1    Standing Expiration Date:   08/08/2020    Order Specific Question:   Reason for Exam (  SYMPTOM  OR DIAGNOSIS REQUIRED)    Answer:   eval right posterior lower rib and back pain. hx colon and breast cancer with mets. Uptake rt 8th rib on bone scan    Order Specific Question:   Preferred  imaging location?    Answer:   [name]    Order Specific Question:   Radiology Contrast Protocol - do NOT remove file path    Answer:   [email or web address]   Meds ordered this encounter  Medications  . traMADol (ULTRAM) 50 MG tablet    Sig: Take 1 tablet (50 mg total) by mouth every 8 (eight) hours as needed for severe pain.    Dispense:  15 tablet    Refill:  0    Discussed warning signs or symptoms. Please see discharge instructions. Patient expresses understanding.   The above documentation has been reviewed and is accurate and complete Lynne Leader

## 2019-06-12 ENCOUNTER — Telehealth: Payer: Self-pay | Admitting: Family Medicine

## 2019-06-12 ENCOUNTER — Telehealth: Payer: Self-pay

## 2019-06-12 ENCOUNTER — Ambulatory Visit: Payer: Medicare HMO | Admitting: Podiatry

## 2019-06-12 DIAGNOSIS — C50912 Malignant neoplasm of unspecified site of left female breast: Secondary | ICD-10-CM

## 2019-06-12 DIAGNOSIS — S2231XA Fracture of one rib, right side, initial encounter for closed fracture: Secondary | ICD-10-CM

## 2019-06-12 MED ORDER — HYDROCODONE-ACETAMINOPHEN 5-325 MG PO TABS: 1.0000 | ORAL_TABLET | Freq: Four times a day (QID) | ORAL | 0 refills | Status: DC | PRN

## 2019-06-12 NOTE — Telephone Encounter (Signed)
Patient called stating that Dr Georgina Snell mentioned that the CT might be able to be done at [facility]? I told her I was unsure about that but I would check to make sure.Marland Kitchen

## 2019-06-12 NOTE — Telephone Encounter (Signed)
We will try to use hydrocodone instead.  New prescription sent to [facility]

## 2019-06-12 NOTE — Telephone Encounter (Signed)
[name] called and LM for pt concerning possibility of getting her CT done in [place] to get in sooner than 06/26/19.  See separate phone note for that.

## 2019-06-12 NOTE — Telephone Encounter (Signed)
Patient was told to call to follow up on how she is doing. She said that she took the Tramadol on Friday night and threw up all night. She felt very tired and was able to sleep for 12 hours (which is not common for her).  She thinks that the Tramadol may have been what made her sick so she has not taken it anymore, She is still having a lot of pain.  Please advise.

## 2019-06-12 NOTE — Telephone Encounter (Signed)
Called and left detailed message on VM regarding XR result and fact that [name] has ordered a CT of her chest.  [facility] will call to schedule.

## 2019-06-12 NOTE — Telephone Encounter (Signed)
CT was scheduled for the 29th of this month and patient wanted to know if that is too far away and wouldn't it be healed or start to heal on its own by then?

## 2019-06-12 NOTE — Telephone Encounter (Signed)
Called and left a message.  CT scan can probably be done sooner at [facility]. Asked patient to call me back.

## 2019-06-13 ENCOUNTER — Other Ambulatory Visit: Payer: Self-pay

## 2019-06-13 ENCOUNTER — Ambulatory Visit (INDEPENDENT_AMBULATORY_CARE_PROVIDER_SITE_OTHER): Payer: Medicare HMO

## 2019-06-13 DIAGNOSIS — J309 Allergic rhinitis, unspecified: Secondary | ICD-10-CM | POA: Diagnosis not present

## 2019-06-13 NOTE — Telephone Encounter (Signed)
Faxed further information to [facility] as requested to get CT scheduled if I do not hear back by end of day will call

## 2019-06-13 NOTE — Telephone Encounter (Signed)
CT scan chest reordered at [facility].  I called and confirmed that the scanner there is working. Direct phone number to [facility] imaging is [phone]

## 2019-06-13 NOTE — Telephone Encounter (Signed)
Patient is scheduled for her CT this week on [address] They were able to get her in sooner. Just wanted to make sure you aware for the authorization.

## 2019-06-13 NOTE — Telephone Encounter (Signed)
Pt returned call. She is OK with going to [facility] if she can get in sooner. She has 2 questions: Will they call her or will we schedule? Does it cost more to have it done at this facility?

## 2019-06-13 NOTE — Telephone Encounter (Signed)
Called pt and [name] spoke to her to discuss the need for the CT scan.  Advised her that the order for the CT scan has been changed to [facility].  She verbalizes understanding.  [name] also informs her that he changed her pain meds and placed a new order yesterday.

## 2019-06-13 NOTE — Telephone Encounter (Signed)
Can you place another order for [facility]?  I know they will call her to schedule, not sure about the pricing difference

## 2019-06-13 NOTE — Addendum Note (Signed)
Addended by: Gregor Hams on: 06/13/2019 09:27 AM   Modules accepted: Orders

## 2019-06-14 ENCOUNTER — Telehealth: Payer: Self-pay | Admitting: Family Medicine

## 2019-06-14 NOTE — Telephone Encounter (Signed)
See below

## 2019-06-14 NOTE — Telephone Encounter (Signed)
Please advise 

## 2019-06-14 NOTE — Telephone Encounter (Signed)
Pt states the hip pain has subsided and she tried to get an appointment but you were full so she is going to see an orthopedic on Friday. Pt had cancelled prolia and I transferred her to the front to get rescheduled for that injection.

## 2019-06-14 NOTE — Telephone Encounter (Signed)
Patient is calling to cancel her appointment for tomorrow for the Prolia shot because she has been having some hip pain and thinks that the Prolia has been making it worse, asked for Dr.Andy's advice on stopping the shots, offered an appointment but declined.

## 2019-06-14 NOTE — Telephone Encounter (Signed)
Prolia should be used consistently in order to help prevent bone loss; if starts and stops, can acutally worsen risk. Pt with h/o back and hip pain; I am uncertain that it is related to the prolia injection and would recommend continuation. We can check her out in the office for her hip pain to discuss further. At this time, I recommend getting her next shot to stay on track to help her bones.

## 2019-06-15 ENCOUNTER — Ambulatory Visit: Payer: Medicare HMO

## 2019-06-15 ENCOUNTER — Inpatient Hospital Stay: Admission: RE | Admit: 2019-06-15 | Payer: Medicare HMO | Source: Ambulatory Visit

## 2019-06-16 ENCOUNTER — Other Ambulatory Visit: Payer: Self-pay

## 2019-06-16 ENCOUNTER — Ambulatory Visit (INDEPENDENT_AMBULATORY_CARE_PROVIDER_SITE_OTHER)
Admission: RE | Admit: 2019-06-16 | Discharge: 2019-06-16 | Disposition: A | Discharge status: Home / Self Care | Payer: Medicare HMO | Source: Ambulatory Visit | Attending: Family Medicine | Admitting: Family Medicine

## 2019-06-16 DIAGNOSIS — S2231XA Fracture of one rib, right side, initial encounter for closed fracture: Secondary | ICD-10-CM | POA: Diagnosis not present

## 2019-06-16 IMAGING — CT CT CHEST W/O CM
2 of 4 series · 15 of 36 positions shown, 18 images · non-contrast
Comparison: No priors.

CLINICAL DATA: 78-year-old female with history of rib uptake on
prior bone scan.

EXAM:
CT CHEST WITHOUT CONTRAST
TECHNIQUE: Multidetector CT imaging of the chest was performed following the
standard protocol without IV contrast.

[Series 2: thorax · axial · 0.59mm/px · z∈[-282,-28]mm · 12 of 151 slices shown, 15 images]
[im 12/151  mediastinal]
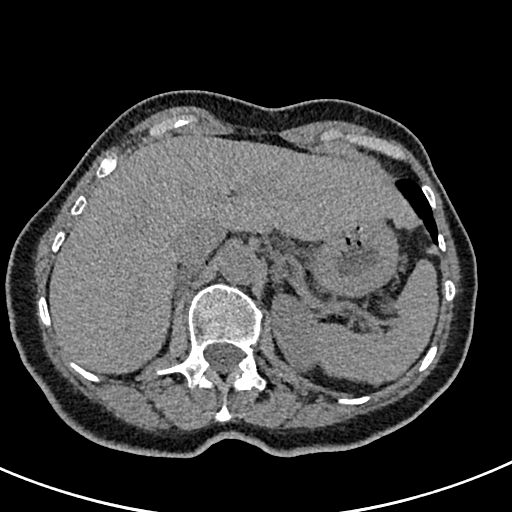
[im 12/151  lung]
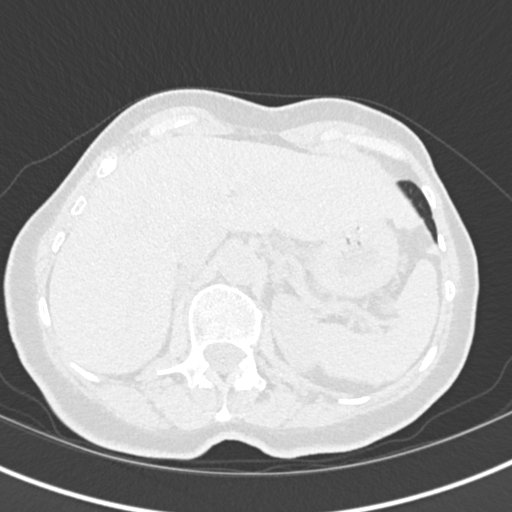
[im 24/151  lung]
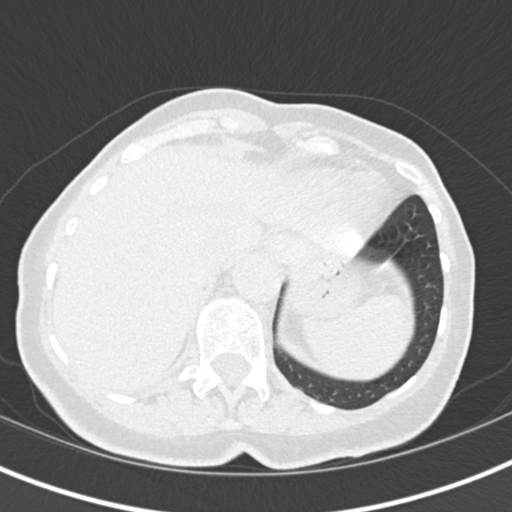
[im 35/151  lung]
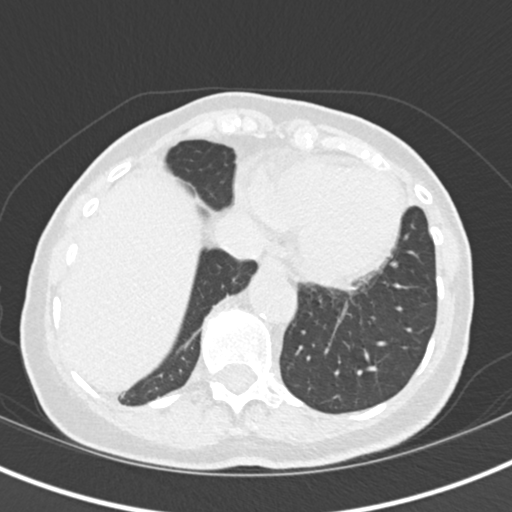
[im 47/151  lung]
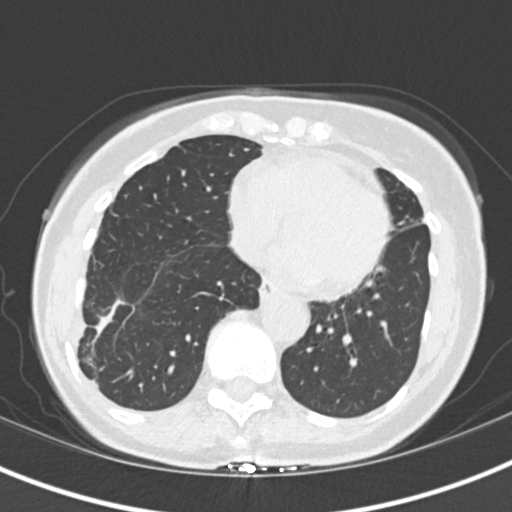
[im 58/151  mediastinal]
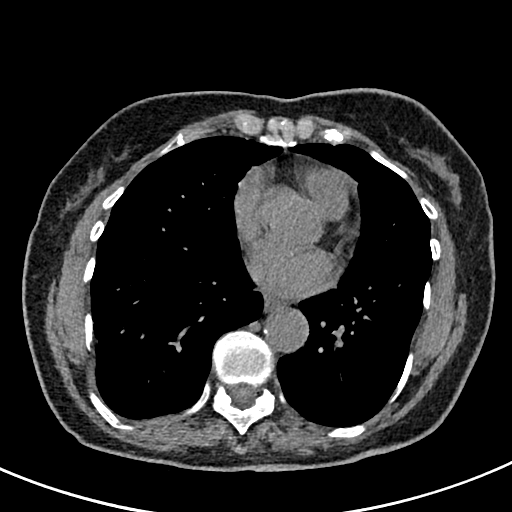
[im 58/151  lung]
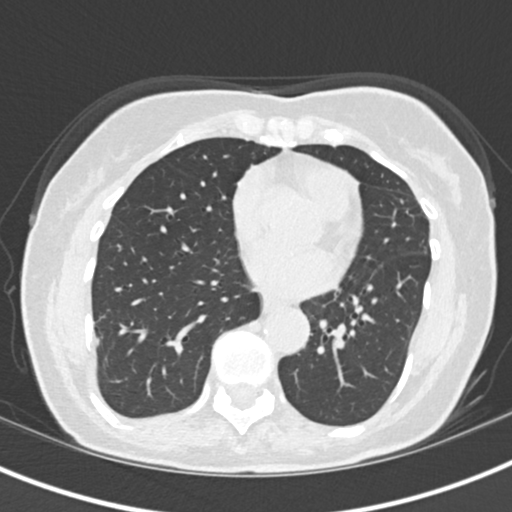
[im 70/151  lung]
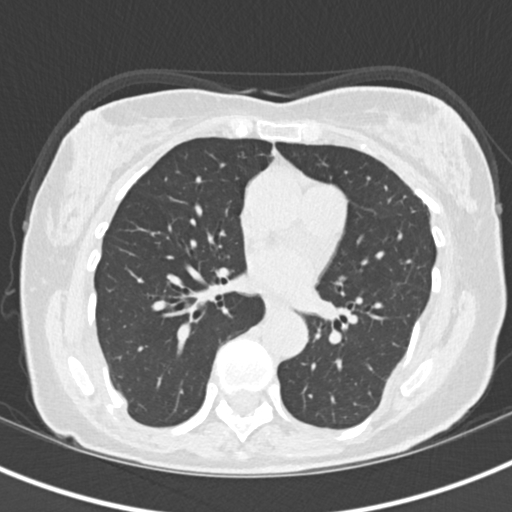
[im 81/151  lung]
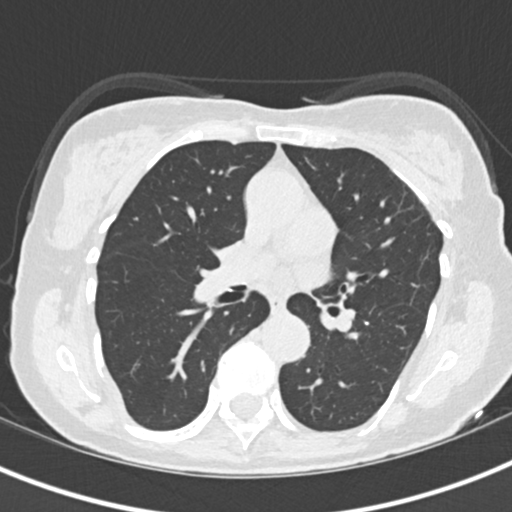
[im 93/151  lung]
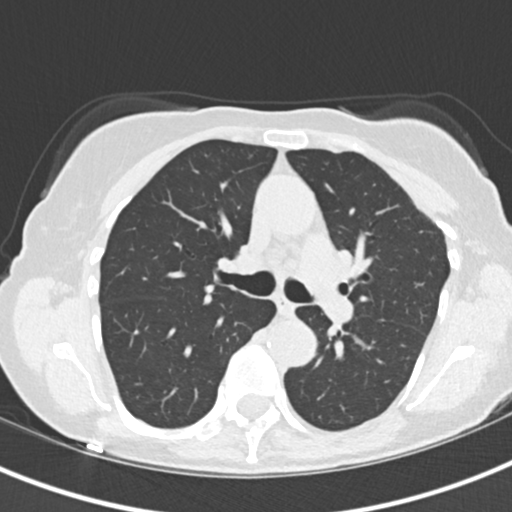
[im 104/151  mediastinal]
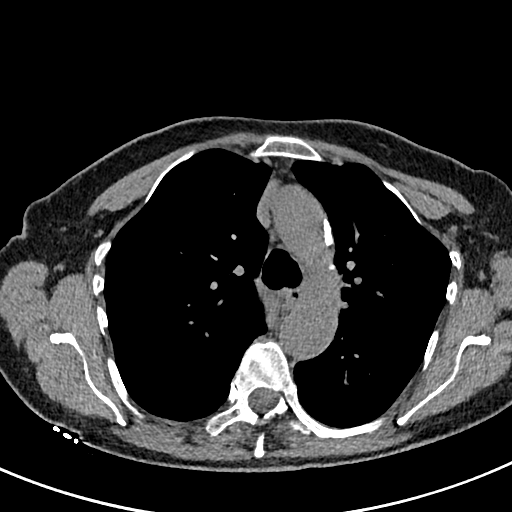
[im 104/151  lung]
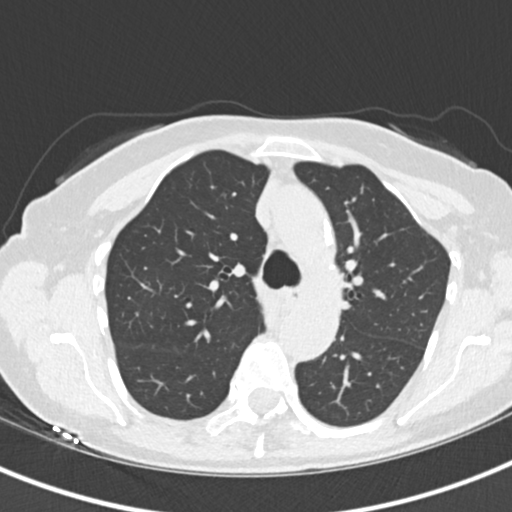
[im 116/151  lung]
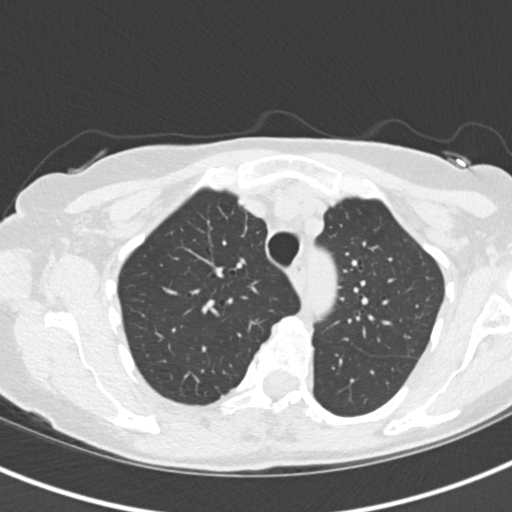
[im 127/151  lung]
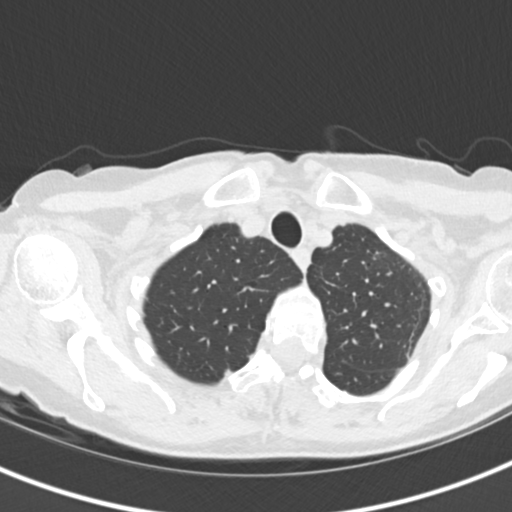
[im 139/151  lung]
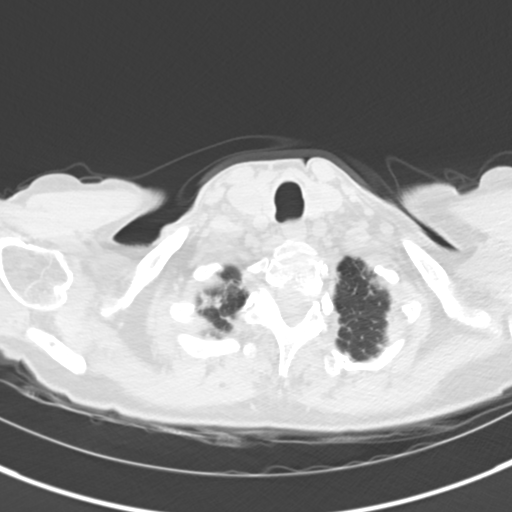

[Series 5: coronal · coronal · 0.54mm/px · 3 of 108 slices shown]
[im 22/108  lung]
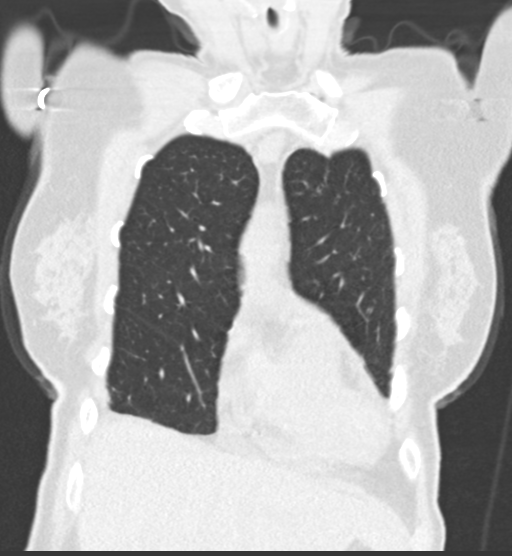
[im 43/108  lung]
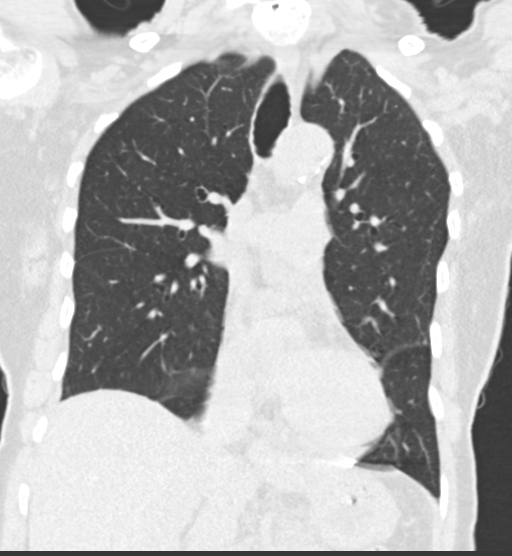
[im 65/108  lung]
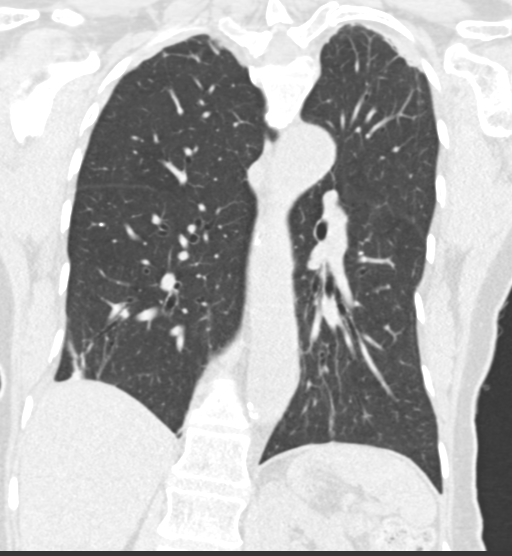

[15 of 36 positions shown; findings below may reference images not displayed]

FINDINGS: Cardiovascular: Heart size is normal. There is no significant
pericardial fluid, thickening or pericardial calcification. Aortic
atherosclerosis. No definite coronary artery calcifications.

Mediastinum/Nodes: No pathologically enlarged mediastinal or hilar
lymph nodes. Please note that accurate exclusion of hilar adenopathy
is limited on noncontrast CT scans. Esophagus is unremarkable in
appearance. No axillary lymphadenopathy.

Lungs/Pleura: A few scattered small pulmonary nodules are noted
throughout the lungs bilaterally, largest of which measures only 3
mm in the right upper lobe (axial image 30 of series 3). No larger
more suspicious appearing pulmonary nodules or masses are noted.
Scattered areas of architectural distortion and scarring are noted,
most evident in the right lower lobe. No pneumothorax. No acute
consolidative airspace disease. No pleural effusions.

Upper Abdomen: Aortic atherosclerosis.

Musculoskeletal: Minimally displaced fracture of the posterolateral
aspect of the right eighth rib. There are no aggressive appearing
lytic or blastic lesions noted in the visualized portions of the
skeleton.
IMPRESSION: 1. Minimally displaced fracture of the posterolateral aspect of the
right eighth rib. No pneumothorax. Mild scarring in the adjacent
portion of the right lower lobe.
2. Aortic atherosclerosis.
3. Small pulmonary nodules measuring 3 mm or less in size in the
lungs bilaterally, nonspecific, but statistically likely benign. No
follow-up needed if patient is low-risk (and has no known or
suspected primary neoplasm). Non-contrast chest CT can be considered
in 12 months if patient is high-risk. This recommendation follows
the consensus statement: Guidelines for Management of Incidental
Pulmonary Nodules Detected on CT Images: From the [HOSPITAL]

Aortic Atherosclerosis ([XI]-[XI]).

## 2019-06-19 NOTE — Progress Notes (Signed)
CT scan does show fracture but does not show any signs of cancer in and around the fracture.

## 2019-06-20 ENCOUNTER — Telehealth: Payer: Self-pay | Admitting: Family Medicine

## 2019-06-20 ENCOUNTER — Ambulatory Visit (INDEPENDENT_AMBULATORY_CARE_PROVIDER_SITE_OTHER): Payer: Medicare HMO

## 2019-06-20 ENCOUNTER — Encounter: Payer: Self-pay | Admitting: Family Medicine

## 2019-06-20 ENCOUNTER — Other Ambulatory Visit: Payer: Self-pay

## 2019-06-20 DIAGNOSIS — R918 Other nonspecific abnormal finding of lung field: Secondary | ICD-10-CM | POA: Insufficient documentation

## 2019-06-20 DIAGNOSIS — J309 Allergic rhinitis, unspecified: Secondary | ICD-10-CM

## 2019-06-20 DIAGNOSIS — R911 Solitary pulmonary nodule: Secondary | ICD-10-CM | POA: Insufficient documentation

## 2019-06-20 NOTE — Telephone Encounter (Signed)
I called Julia [name] back and answered questions. Plan to follow up her rib fracture on 07/20/19 Recheck sooner if needed

## 2019-06-21 ENCOUNTER — Ambulatory Visit (INDEPENDENT_AMBULATORY_CARE_PROVIDER_SITE_OTHER): Payer: Medicare HMO

## 2019-06-21 DIAGNOSIS — M810 Age-related osteoporosis without current pathological fracture: Secondary | ICD-10-CM

## 2019-06-21 MED ORDER — DENOSUMAB 60 MG/ML ~~LOC~~ SOSY
60.0000 mg | PREFILLED_SYRINGE | Freq: Once | SUBCUTANEOUS | Status: AC
  Administered 2019-06-21: 60 mg via SUBCUTANEOUS

## 2019-06-21 NOTE — Progress Notes (Signed)
Patient came into the office today to receive her Prolia injection. Patient tolerated injection well. No questions or concerns at this time.

## 2019-06-22 ENCOUNTER — Other Ambulatory Visit: Payer: Self-pay | Admitting: Neurology

## 2019-06-26 ENCOUNTER — Inpatient Hospital Stay: Admission: RE | Admit: 2019-06-26 | Payer: Medicare HMO | Source: Ambulatory Visit

## 2019-06-26 ENCOUNTER — Other Ambulatory Visit: Payer: Self-pay

## 2019-06-27 ENCOUNTER — Other Ambulatory Visit: Payer: Self-pay

## 2019-06-27 ENCOUNTER — Ambulatory Visit (INDEPENDENT_AMBULATORY_CARE_PROVIDER_SITE_OTHER): Payer: Medicare HMO

## 2019-06-27 DIAGNOSIS — J309 Allergic rhinitis, unspecified: Secondary | ICD-10-CM

## 2019-06-29 ENCOUNTER — Ambulatory Visit: Payer: Medicare HMO | Admitting: Podiatry

## 2019-06-29 ENCOUNTER — Other Ambulatory Visit: Payer: Self-pay

## 2019-06-29 ENCOUNTER — Encounter: Payer: Self-pay | Admitting: Podiatry

## 2019-06-29 DIAGNOSIS — L600 Ingrowing nail: Secondary | ICD-10-CM

## 2019-06-29 DIAGNOSIS — Q828 Other specified congenital malformations of skin: Secondary | ICD-10-CM

## 2019-06-29 DIAGNOSIS — M79674 Pain in right toe(s): Secondary | ICD-10-CM | POA: Diagnosis not present

## 2019-06-29 DIAGNOSIS — M216X9 Other acquired deformities of unspecified foot: Secondary | ICD-10-CM

## 2019-06-29 DIAGNOSIS — B351 Tinea unguium: Secondary | ICD-10-CM

## 2019-06-29 DIAGNOSIS — G629 Polyneuropathy, unspecified: Secondary | ICD-10-CM | POA: Diagnosis not present

## 2019-06-29 DIAGNOSIS — M79675 Pain in left toe(s): Secondary | ICD-10-CM

## 2019-07-03 NOTE — Progress Notes (Signed)
Agree with documentation and careplan.

## 2019-07-04 ENCOUNTER — Ambulatory Visit (INDEPENDENT_AMBULATORY_CARE_PROVIDER_SITE_OTHER): Payer: Medicare HMO

## 2019-07-04 ENCOUNTER — Other Ambulatory Visit: Payer: Self-pay

## 2019-07-04 DIAGNOSIS — J309 Allergic rhinitis, unspecified: Secondary | ICD-10-CM | POA: Diagnosis not present

## 2019-07-04 NOTE — Progress Notes (Signed)
Subjective:   Patient ID: Julia [name], female   DOB: 79 y.o.   MRN: [id number]   HPI  79 year old female presents the office today for concerns of calluses to both of her feet on the ball of her foot.  They do hurt with walking and pressure.  She denies any open sores no redness.  No drainage.  Also she states her nails are thick and discolored she cannot trim her self.  She also has numbness to both of her feet she previously was on gabapentin for neuropathy but had to stop this due to side effects.  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   Review of Systems  All other systems reviewed and are negative.  Past Medical History:  Diagnosis Date  . Arthritis   . Breast cancer ([place])   . Colon cancer ([place])    previous colon cancer  . Hyperlipidemia   . 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 RLS (restless legs syndrome) 07/14/2018  . Secondary malignant neoplasm of unspecified ovary [facility])     Past Surgical History:  Procedure Laterality Date  . ADENOIDECTOMY    . BREAST LUMPECTOMY Left    radiation only  . BUNIONECTOMY    . COLON SURGERY    . HEMORRHOID SURGERY    . LAPAROSCOPY    . NASAL SINUS SURGERY    . OOPHORECTOMY    . TONSILLECTOMY    . TUBAL LIGATION       Current Outpatient Medications:  .  NON FORMULARY, [facility]  Creams-#11 Peripheral Neuropathy cream, Disp: , Rfl:  .  Calcium Citrate-Vitamin D 315-250 MG-UNIT TABS, Take by mouth., Disp: , Rfl:  .  Carbidopa-Levodopa ER (SINEMET CR) 25-100 MG tablet controlled release, TAKE 1 TABLET BY MOUTH FOUR TIMES A DAY, Disp: 360 tablet, Rfl: 1 .  denosumab (PROLIA) 60 MG/ML SOSY injection, Inject 60 mg into the skin every 6 (six) months., Disp: , Rfl:  .  EPINEPHRINE 0.3 mg/0.3 mL IJ SOAJ injection, INJECT INTO THE MIDDLE OF THE OUTER THIGH AND HOLD FOR 3 SECONDS AS NEEDED FOR SEVERE ALLERGIC REACTION THEN CALL 911 IF USED., Disp: 2 each, Rfl:  1 .  Fexofenadine HCl (ALLEGRA ALLERGY PO), Take by mouth., Disp: , Rfl:  .  fluticasone (FLONASE) 50 MCG/ACT nasal spray, SPRAY TWO SPRAYS IN EACH NOSTRIL ONCE DAILY AS NEEDED, Disp: 16 mL, Rfl: 5 .  gabapentin (NEURONTIN) 100 MG capsule, TAKE 1 CAPSULE BY MOUTH AT BEDTIME, Disp: 90 capsule, Rfl: 0 .  HYDROcodone-acetaminophen (NORCO/VICODIN) 5-325 MG tablet, Take 1 tablet by mouth every 6 (six) hours as needed., Disp: 15 tablet, Rfl: 0 .  levothyroxine (SYNTHROID) 88 MCG tablet, TAKE ONE TABLET BY MOUTH DAILY BEFORE BREAKFAST, Disp: 90 tablet, Rfl: 2 .  LORazepam (ATIVAN) 0.5 MG tablet, Take 1 tablet (0.5 mg total) by mouth once as needed for anxiety. Take 1 hour before MRI, Disp: 2 tablet, Rfl: 0 .  Multiple Vitamins-Minerals (CENTRUM SILVER PO), Take by mouth., Disp: , Rfl:  .  Probiotic Product (PROBIOTIC DAILY PO), Take by mouth., Disp: , Rfl:  .  tamoxifen (NOLVADEX) 20 MG tablet, Take 20 mg by mouth daily., Disp: , Rfl:  .  traMADol (ULTRAM) 50 MG tablet, Take 1 tablet (50 mg total) by mouth every 8 (eight) hours as needed for severe pain., Disp: 15 tablet, Rfl: 0 .  triamcinolone cream (KENALOG) 0.1 %, Apply 1  application topically 2 (two) times daily., Disp: 30 g, Rfl: 0 .  UNABLE TO FIND, Med Name: Allergy shots 1/week, Disp: , Rfl:   Allergies  Allergen Reactions  . Cefdinir   . Cholestyramine     GERD  . Contrast Media [Iodinated Diagnostic Agents] Diarrhea  . Ezetimibe   . Meperidine   . Nitrofurantoin Diarrhea  . Statins   . Teriparatide          Objective:  Physical Exam  General: AAO x3, NAD  Dermatological: Nails are hypertrophic, dystrophic, brittle, discolored, elongated 10. No surrounding redness or drainage. Tenderness nails 1-5 bilaterally.  Hyperkeratotic lesions bilateral submetatarsal area.  Upon debridement there is no ongoing ulceration, drainage or any signs of infection.  No open lesions or other pre-ulcerative lesions are identified  today.  Vascular: Dorsalis Pedis artery and Posterior Tibial artery pedal pulses are 2/4 bilateral with immedate capillary fill time. There is no pain with calf compression, swelling, warmth, erythema.   Neruologic: Sensation intact with Thornell Mule monofilament she still describing numbness to her feet.  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  Musculoskeletal:  Muscular strength 5/5 in all groups tested bilateral.  Assessment:   Symptomatic hyperkeratotic lesions, onychomycosis    Plan:  -Treatment options discussed including all alternatives, risks, and complications -Etiology of symptoms were discussed -Nails debrided x10 without any complications or bleeding -Hyperkeratotic lesion sharply debrided x2 without any complications or bleeding.  Discussed moisturizer.  Offloading. -Discussed neuropathy.  Unfortunately she has not been able to tolerate the medication.  I did order a compound cream today through [facility] to include gabapentin.  Return in about 3 months (around 09/28/2019).  Trula Slade DPM

## 2019-07-11 ENCOUNTER — Other Ambulatory Visit: Payer: Self-pay

## 2019-07-11 ENCOUNTER — Ambulatory Visit (INDEPENDENT_AMBULATORY_CARE_PROVIDER_SITE_OTHER): Payer: Medicare HMO

## 2019-07-11 DIAGNOSIS — J309 Allergic rhinitis, unspecified: Secondary | ICD-10-CM

## 2019-07-12 ENCOUNTER — Inpatient Hospital Stay: Payer: Medicare HMO | Attending: Nurse Practitioner

## 2019-07-12 ENCOUNTER — Inpatient Hospital Stay (HOSPITAL_BASED_OUTPATIENT_CLINIC_OR_DEPARTMENT_OTHER): Payer: Medicare HMO | Admitting: Nurse Practitioner

## 2019-07-12 ENCOUNTER — Telehealth: Payer: Self-pay | Admitting: Hematology

## 2019-07-12 ENCOUNTER — Encounter: Payer: Self-pay | Admitting: Nurse Practitioner

## 2019-07-12 VITALS — BP 143/78 | HR 90 | Temp 98.3°F | Resp 17 | Ht 64.0 in | Wt 139.0 lb

## 2019-07-12 DIAGNOSIS — Z170 Estrogen receptor positive status [ER+]: Secondary | ICD-10-CM | POA: Diagnosis not present

## 2019-07-12 DIAGNOSIS — Z79899 Other long term (current) drug therapy: Secondary | ICD-10-CM | POA: Diagnosis not present

## 2019-07-12 DIAGNOSIS — C50412 Malignant neoplasm of upper-outer quadrant of left female breast: Secondary | ICD-10-CM | POA: Insufficient documentation

## 2019-07-12 DIAGNOSIS — Z85038 Personal history of other malignant neoplasm of large intestine: Secondary | ICD-10-CM | POA: Insufficient documentation

## 2019-07-12 DIAGNOSIS — Z79810 Long term (current) use of selective estrogen receptor modulators (SERMs): Secondary | ICD-10-CM | POA: Insufficient documentation

## 2019-07-12 DIAGNOSIS — Z923 Personal history of irradiation: Secondary | ICD-10-CM | POA: Insufficient documentation

## 2019-07-12 DIAGNOSIS — C50912 Malignant neoplasm of unspecified site of left female breast: Secondary | ICD-10-CM

## 2019-07-12 DIAGNOSIS — M25552 Pain in left hip: Secondary | ICD-10-CM | POA: Diagnosis not present

## 2019-07-12 DIAGNOSIS — E079 Disorder of thyroid, unspecified: Secondary | ICD-10-CM | POA: Insufficient documentation

## 2019-07-12 DIAGNOSIS — G20 Parkinson's disease: Secondary | ICD-10-CM | POA: Insufficient documentation

## 2019-07-12 DIAGNOSIS — C50919 Malignant neoplasm of unspecified site of unspecified female breast: Secondary | ICD-10-CM

## 2019-07-12 DIAGNOSIS — N8110 Cystocele, unspecified: Secondary | ICD-10-CM | POA: Diagnosis not present

## 2019-07-12 LAB — CBC WITH DIFFERENTIAL (CANCER CENTER ONLY)
Abs Immature Granulocytes: 0.02 10*3/uL (ref 0.00–0.07)
Basophils Absolute: 0.1 10*3/uL (ref 0.0–0.1)
Basophils Relative: 1 %
Eosinophils Absolute: 0.1 10*3/uL (ref 0.0–0.5)
Eosinophils Relative: 2 %
HCT: 42.2 % (ref 36.0–46.0)
Hemoglobin: 13.6 g/dL (ref 12.0–15.0)
Immature Granulocytes: 0 %
Lymphocytes Relative: 13 %
Lymphs Abs: 1 10*3/uL (ref 0.7–4.0)
MCH: 30.7 pg (ref 26.0–34.0)
MCHC: 32.2 g/dL (ref 30.0–36.0)
MCV: 95.3 fL (ref 80.0–100.0)
Monocytes Absolute: 0.5 10*3/uL (ref 0.1–1.0)
Monocytes Relative: 7 %
Neutro Abs: 5.8 10*3/uL (ref 1.7–7.7)
Neutrophils Relative %: 77 %
Platelet Count: 230 10*3/uL (ref 150–400)
RBC: 4.43 MIL/uL (ref 3.87–5.11)
RDW: 13.8 % (ref 11.5–15.5)
WBC Count: 7.4 10*3/uL (ref 4.0–10.5)
nRBC: 0 % (ref 0.0–0.2)

## 2019-07-12 LAB — CMP (CANCER CENTER ONLY)
ALT: 8 U/L (ref 0–44)
AST: 16 U/L (ref 15–41)
Albumin: 3.9 g/dL (ref 3.5–5.0)
Alkaline Phosphatase: 44 U/L (ref 38–126)
Anion gap: 7 (ref 5–15)
BUN: 22 mg/dL (ref 8–23)
CO2: 30 mmol/L (ref 22–32)
Calcium: 9.9 mg/dL (ref 8.9–10.3)
Chloride: 103 mmol/L (ref 98–111)
Creatinine: 1.08 mg/dL — ABNORMAL HIGH (ref 0.44–1.00)
GFR, Est AFR Am: 58 mL/min — ABNORMAL LOW (ref >60)
GFR, Estimated: 50 mL/min — ABNORMAL LOW (ref >60)
Glucose, Bld: 133 mg/dL — ABNORMAL HIGH (ref 70–99)
Potassium: 4.2 mmol/L (ref 3.5–5.1)
Sodium: 140 mmol/L (ref 135–145)
Total Bilirubin: 0.3 mg/dL (ref 0.3–1.2)
Total Protein: 6.8 g/dL (ref 6.5–8.1)

## 2019-07-12 MED ORDER — TAMOXIFEN CITRATE 20 MG PO TABS: 20.0000 mg | ORAL_TABLET | Freq: Every day | ORAL | 6 refills | Status: DC

## 2019-07-12 NOTE — Telephone Encounter (Signed)
Scheduled per los. Patient declined printout  

## 2019-07-12 NOTE — Progress Notes (Signed)
[facility]   [phone] [phone]   Clinic Follow up Note   Patient Care Team: Leamon Arnt, MD as PCP - General (Family Medicine) Kathrynn Ducking, MD as Consulting Physician (Neurology) Bobbitt, Sed[place] Muta, MD as Consulting Physician (Allergy and Immunology) Mansouraty, Telford Nab., MD as Consulting Physician (Gastroenterology) Truitt Merle, MD as Consulting Physician (Oncology) 07/12/2019  CHIEF COMPLAINT: F/u history of breast cancer and colon cancer   PROBLEM LIST:  1. H/o Adenocarcinoma of the cecum in 12/2000  -s/p right hemicolectomy and 6 months of adjuvant 5FU-based chemotherapy.  -Reportedly had recurrence in the pelvis/ovary in 08/2002 and underwent TAH/BSO, omenectomy, LN dissection, and resection of peritoneal tumors  -additional 6 months of FOLFOX chemo  2. H/o breast cancer found on screening mammogram in 01/2015.  -Diagnostic mammo/US demonstrated a palpable 18 mm mass at the 2 o'clock position left breast, path showed invasive ductal carcinoma, Nottingham grade 2, reportedly ER+/PR-/HER2-.  -S/p left lumpectomy with SLN biopsy, path showed 2 cm IDC, grade 2, 2/5 SLN positive for carcinoma.  -S/p adjuvant radiation  -Began anti-estrogen therapy in early 2017. Did not tolerate AI. On tamoxifen  -Genetic testing with Custom-Next cancer analysis panel including lynch syndrome and BRCA1/2 among others, which was negative.   CURRENT THERAPY: Tamoxifen, starting early 2017  INTERVAL HISTORY: Julia [name] returns for f/u as scheduled. She was last seen 03/13/19. She denies changes in her breast such as new lump/mass or nipple discharge/inversion. She continues tamoxifen. She has persistent "sticky" vaginal discharge that is bothersome. Denies vaginal or other bleeding. Hot flashes are mild and intermittent. Mood fluctuates with anxiety then depression. She has occasional back pain and left hip pain she attributes to Prolia. Denies new bone  pains. She also attributes exertional dyspnea to Prolia. Takes calcium and vitamin D. No cough, chest pain, leg edema, fever, or chills. Her appetite and baseline fatigue are stable. She has mild nausea she attributes to Parkinson's medication. She has chronic stable constipation. Occasionally feels full quickly, but worse when she's constipated. This is not new. She continues f/u with PCP, ortho/sports med, and podiatry. She has not seen GI lately.   MEDICAL HISTORY:  Past Medical History:  Diagnosis Date  . Arthritis   . Breast cancer ([place])   . Colon cancer ([place])    previous colon cancer  . Hyperlipidemia   . 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 RLS (restless legs syndrome) 07/14/2018  . 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[place])     SURGICAL HISTORY: Past Surgical History:  Procedure Laterality Date  . ADENOIDECTOMY    . BREAST LUMPECTOMY Left    radiation only  . BUNIONECTOMY    . COLON SURGERY    . HEMORRHOID SURGERY    . LAPAROSCOPY    . NASAL SINUS SURGERY    . OOPHORECTOMY    . TONSILLECTOMY    . TUBAL LIGATION      I have reviewed the social history and family history with the patient and they are unchanged from previous note.  ALLERGIES:  is allergic to cefdinir; cholestyramine; contrast media [iodinated diagnostic agents]; ezetimibe; meperidine; nitrofurantoin; statins; and teriparatide.  MEDICATIONS:  Current Outpatient Medications  Medication Sig Dispense Refill  . Calcium Citrate-Vitamin D 315-250 MG-UNIT TABS Take by mouth.    . Carbidopa-Levodopa ER (SINEMET CR) 25-100 MG tablet controlled release TAKE 1 TABLET BY MOUTH FOUR TIMES A DAY 360 tablet 1  . denosumab (PROLIA) 60 MG/ML SOSY injection Inject  60 mg into the skin every 6 (six) months.    . EPINEPHRINE 0.3 mg/0.3 mL IJ SOAJ injection INJECT INTO THE MIDDLE OF THE OUTER THIGH AND HOLD FOR 3 SECONDS AS NEEDED FOR SEVERE ALLERGIC REACTION THEN CALL 911 IF USED. 2 each 1  . Fexofenadine HCl  (ALLEGRA ALLERGY PO) Take by mouth.    . fluticasone (FLONASE) 50 MCG/ACT nasal spray SPRAY TWO SPRAYS IN EACH NOSTRIL ONCE DAILY AS NEEDED 16 mL 5  . gabapentin (NEURONTIN) 100 MG capsule TAKE 1 CAPSULE BY MOUTH AT BEDTIME (Patient not taking: Reported on 07/12/2019) 90 capsule 0  . HYDROcodone-acetaminophen (NORCO/VICODIN) 5-325 MG tablet Take 1 tablet by mouth every 6 (six) hours as needed. 15 tablet 0  . levothyroxine (SYNTHROID) 88 MCG tablet TAKE ONE TABLET BY MOUTH DAILY BEFORE BREAKFAST 90 tablet 2  . LORazepam (ATIVAN) 0.5 MG tablet Take 1 tablet (0.5 mg total) by mouth once as needed for anxiety. Take 1 hour before MRI 2 tablet 0  . Multiple Vitamins-Minerals (CENTRUM SILVER PO) Take by mouth.    . NON FORMULARY [facility]  Creams-#11 Peripheral Neuropathy cream    . Probiotic Product (PROBIOTIC DAILY PO) Take by mouth.    . tamoxifen (NOLVADEX) 20 MG tablet Take 1 tablet (20 mg total) by mouth daily. 30 tablet 6  . traMADol (ULTRAM) 50 MG tablet Take 1 tablet (50 mg total) by mouth every 8 (eight) hours as needed for severe pain. 15 tablet 0  . triamcinolone cream (KENALOG) 0.1 % Apply 1 application topically 2 (two) times daily. 30 g 0  . UNABLE TO FIND Med Name: Allergy shots 1/week     No current facility-administered medications for this visit.    PHYSICAL EXAMINATION: ECOG PERFORMANCE STATUS: 1 - Symptomatic but completely ambulatory  Vitals:   07/12/19 1305  BP: (!) 143/78  Pulse: 90  Resp: 17  Temp: 98.3 F (36.8 C)  SpO2: 100%   Filed Weights   07/12/19 1305  Weight: 139 lb (63 kg)    GENERAL:alert, no distress and comfortable SKIN: no rash EYES: sclera clear LYMPH:  no palpable cervical, supraclavicular, or axillary lymphadenopathy LUNGS: clear with normal breathing effort HEART: regular rate & rhythm, no lower extremity edema ABDOMEN: abdomen soft, non-tender and normal bowel sounds Musculoskeletal: full ROM of upper extremities  NEURO: alert &  oriented x 3 with fluent speech, normal gait BREAST: s/p left lumpectomy and radiation. Incisions completely healed. The breast tissue is dense bilaterally. No palpable mass in either breast or axilla that I could appreciate. No nipple discharge or inversion   LABORATORY DATA:  I have reviewed the data as listed CBC Latest Ref Rng & Units 07/12/2019 03/13/2019 10/12/2018  WBC 4.0 - 10.5 K/uL 7.4 7.0 6.8  Hemoglobin 12.0 - 15.0 g/dL 13.6 13.9 13.4  Hematocrit 36.0 - 46.0 % 42.2 42.4 40.8  Platelets 150 - 400 K/uL 230 258 218.0     CMP Latest Ref Rng & Units 07/12/2019 03/13/2019 12/28/2018  Glucose 70 - 99 mg/dL 133(H) 103(H) -  BUN 8 - 23 mg/dL 22 22 -  Creatinine 0.44 - 1.00 mg/dL 1.08(H) 1.01(H) -  Sodium 135 - 145 mmol/L 140 140 -  Potassium 3.5 - 5.1 mmol/L 4.2 4.3 -  Chloride 98 - 111 mmol/L 103 104 -  CO2 22 - 32 mmol/L 30 25 -  Calcium 8.9 - 10.3 mg/dL 9.9 9.5 -  Total Protein 6.5 - 8.1 g/dL 6.8 7.1 6.3  Total Bilirubin 0.3 -  1.2 mg/dL 0.3 0.2(L) -  Alkaline Phos 38 - 126 U/L 44 41 -  AST 15 - 41 U/L 16 17 -  ALT 0 - 44 U/L 8 <6 -      RADIOGRAPHIC STUDIES: I have personally reviewed the radiological images as listed and agreed with the findings in the report. No results found.   ASSESSMENT & PLAN: 79 yo female with   1. 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, stage II  -Diagnosed in 01/2015, s/p lumpectomy, adjuvant radiation, and endocrine therapy starting in 2017, she was reportedly intolerant to AI and is now on tamoxifen -I have requested outside records repeatedly but have not received path report or any molecular studies (mammaprint/oncotype if done) -she continues surveillance -She has breast density category d which is extremely dense and may obscure small masses, she would benefit from screening breast MRI in addition to mammogram  -MRI in 03/20/2019 was negative for malignancy, mammogram in 05/2019 also negative, continue  annually -Julia [name] is clinically doing well from a breast cancer standpoint. Breast exam and labs today are unremarkable. No clinical concern for recurrence.  -she is tolerating tamoxifen well with mild vaginal discharge, some multifactorial hip and back pain, and mood fluctuation which can also be attributed to parkinson's regimen. She will continue at least for 5 total years, which would bring [place] to early 2022.  -she will return for next routine surveillance visit in 6 months.   2. 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 -last colonoscopy in 2018 -followed by Dr. Rush Landmark    3. Left hip pain  -gradual onset around Oct 2020, questionably after she began prolia injections  -pain is worse in the morning, improves with movement -I obtained left hip xray including pelvis on 12/14 which shows questionable tiny sclerotic/lucent focus over the left femoral neck, MRI favored this to represent OA finding. -Due to abnormal signal in L3-L4 on MRI, a lumbar MRI was ordered which showed degenerative disease. Bone scan on 04/13/19 was negative for osseous metastasis, did show incidental uptake in 8th rib which coincides with minimally displaced fracture of the posterolateral aspect of the right eighth rib on CT chest from 06/16/19  -pain is overall mild, intermittent, and stable, patient attributes to Prolia   4. Family history, genetics  -Due to personal history of breast and colon cancers and strong family history including stomach, esophagus, uterine, breast, and  brain cancer in father and siblings, she underwent genetic testing which was negative in 01/2015  5. Bone health, osteoporosis  -T-score is -2.7 at [facility] in 2018, and worsened to -3.9 at [facility] in 2020 -on Prolia and cal+vit D per PCP. I also reviewed the bone strengthening quality of tamoxifen -encouraged light weight bearing exercises  -she does not like Prolia, I recommend for her to discuss with Dr. Jonni Sanger   6. Vaginal prolapse -likely secondary to abdominal surgeries  -I previously referred her to Urogynecology at [facility] -Not discussed today  7. Chronic conditions - parkinson's diease, thyroid disease  -Followed by neurology, PCP  PLAN: -Labs, mammogram reviewed -Continue tamoxifen and breast cancer surveillance  -Continue f/u with care team, PCP, neuro, ortho, GI, podiatry  -Next routine surveillance visit with lab in 6 months   All questions were answered. The patient knows to call the clinic with any problems, questions or concerns. No barriers to learning were detected. Total encounter time was 30 minutes.      Alla Feeling, NP 07/12/19

## 2019-07-18 ENCOUNTER — Other Ambulatory Visit: Payer: Self-pay

## 2019-07-18 ENCOUNTER — Ambulatory Visit (INDEPENDENT_AMBULATORY_CARE_PROVIDER_SITE_OTHER): Payer: Medicare HMO

## 2019-07-18 DIAGNOSIS — J309 Allergic rhinitis, unspecified: Secondary | ICD-10-CM

## 2019-07-20 ENCOUNTER — Encounter: Payer: Self-pay | Admitting: Family Medicine

## 2019-07-20 ENCOUNTER — Ambulatory Visit: Payer: Self-pay

## 2019-07-20 ENCOUNTER — Ambulatory Visit: Payer: Medicare HMO | Admitting: Family Medicine

## 2019-07-20 ENCOUNTER — Other Ambulatory Visit: Payer: Self-pay

## 2019-07-20 VITALS — BP 130/78 | HR 91 | Ht 64.0 in | Wt 139.2 lb

## 2019-07-20 DIAGNOSIS — M25511 Pain in right shoulder: Secondary | ICD-10-CM | POA: Diagnosis not present

## 2019-07-20 DIAGNOSIS — R252 Cramp and spasm: Secondary | ICD-10-CM | POA: Diagnosis not present

## 2019-07-20 DIAGNOSIS — M899 Disorder of bone, unspecified: Secondary | ICD-10-CM

## 2019-07-20 DIAGNOSIS — S2231XD Fracture of one rib, right side, subsequent encounter for fracture with routine healing: Secondary | ICD-10-CM | POA: Diagnosis not present

## 2019-07-20 DIAGNOSIS — M25551 Pain in right hip: Secondary | ICD-10-CM

## 2019-07-20 MED ORDER — BACLOFEN 5 MG PO TABS: 5.0000 mg | ORAL_TABLET | Freq: Every evening | ORAL | 1 refills | Status: DC | PRN

## 2019-07-20 NOTE — Patient Instructions (Addendum)
Thank you for coming in today. Attend PT for the schoulder pain.  Try baclofen at bedtime for leg cramping or twitching.  Look up Evenity.  Romosozumab injection What is this medicine? ROMOSOZUMAB (roe moe SOZ ue mab) increases bone formation. It is used to treat osteoporosis in women. This medicine may be used for other purposes; ask your health care provider or pharmacist if you have questions. COMMON BRAND NAME(S): EVENITY What should I tell my health care provider before I take this medicine? They need to know if you have any of these conditions:  dental disease or wear dentures  heart disease  history of stroke  kidney disease  low levels of calcium in the blood  an unusual or allergic reaction to romosozumab, other medicines, foods, dyes or preservatives  pregnant or trying to get pregnant  breast-feeding How should I use this medicine? This medicine is for injection under the skin. It is given by a healthcare professional in a hospital or clinic setting. Talk to your pediatrician about the use of this medicine in children. Special care may be needed. Overdosage: If you think you have taken too much of this medicine contact a poison control center or emergency room at once. NOTE: This medicine is only for you. Do not share this medicine with others. What if I miss a dose? Keep appointments for follow-up doses. It is important not to miss your dose. Call your doctor or healthcare professional if you are unable to keep an appointment. What may interact with this medicine? Interactions are not expected. This list may not describe all possible interactions. Give your health care provider a list of all the medicines, herbs, non-prescription drugs, or dietary supplements you use. Also tell them if you smoke, drink alcohol, or use illegal drugs. Some items may interact with your medicine. What should I watch for while using this medicine? Your condition will be monitored  carefully while you are receiving this medicine. You may need blood work done while you are taking this medicine. Some people who take this medicine have severe bone, joint, or muscle pain. This medicine may also increase your risk for jaw problems or a broken thigh bone. Tell your healthcare professional right away if you have severe pain in your jaw, bones, joints, or muscles. Tell your healthcare professional if you have any pain that does not go away or that gets worse. You should make sure you get enough calcium and vitamin D while you are taking this medicine. Discuss the foods you eat and the vitamins you take with your healthcare professional. Tell your dentist and dental surgeon that you are taking this medicine. You should not have major dental surgery while on this medicine. See your dentist to have a dental exam and fix any dental problems before starting this medicine. Take good care of your teeth while on this medicine. Make sure you see your dentist for regular follow-up appointments. What side effects may I notice from receiving this medicine? Side effects that you should report to your doctor or health care professional as soon as possible:  allergic reactions like skin rash, itching or hives, swelling of the face, lips, or tongue  bone pain  chest pain or chest tightness  jaw pain, especially after dental work  signs and symptoms of a stroke like changes in vision; confusion; trouble speaking or understanding; severe headaches; sudden numbness or weakness of the face, arm or leg; trouble walking; dizziness; loss of balance or coordination  signs and  symptoms of low calcium like fast heartbeat; muscle cramps; muscle pain; pain, tingling, numbness in the hands or feet; seizures Side effects that usually do not require medical attention (report these to your doctor or health care professional if they continue or are bothersome):  headache  joint pain  pain, redness, or  irritation at site where injected  pain, tingling, numbness in the hands or feet  swelling of ankles, feet, hands  trouble sleeping This list may not describe all possible side effects. Call your doctor for medical advice about side effects. You may report side effects to FDA at 1-800-FDA-1088. Where should I keep my medicine? This medicine is given in a hospital or clinic and will not be stored at home. NOTE: This sheet is a summary. It may not cover all possible information. If you have questions about this medicine, talk to your doctor, pharmacist, or health care provider.  2020 Elsevier/Gold Standard (2017-07-08 01:15:32)

## 2019-07-20 NOTE — Progress Notes (Signed)
I, [name], LAT, ATC, am serving as scribe for [name].  Julia [name] is a 79 y.o. female who presents to [facility] today for f/u of a R 8th rib fx.  She was last seen by [name] on 06/09/19 and was referred for a chest CT.  She was prescribed Tramadol initially which was switched to Hydrocodone due to a drug reaction w/ the Tramadol.  Since her last visit, pt reports that she con't to have discomfort.  She notes that she is not having much sharp pain.  The original rib pain has effectively resolved at this point.    She is having a lot of popping in her R shoulder and shoulder blade.  She notes a popping clicking sensation at the medial and inferior margin of her scapula bilaterally right worse than left with shoulder abduction motion.  She notes this is sometimes painful.  Additionally she notes that her legs are twitchy and cramping at night.  There is some thought of restless leg syndrome and she has had trials of gabapentin which were not helpful.  She has a history of Parkinson's disease and is currently on Sinemet.  She notes this does interfere with sleep.  Additionally she notes that she is having arthralgias bone soreness from Prolia.  She is pretty sure it is the Prolia that is causing her bone pain.  She notes that when she gets her injection she has worsening pain for several months that slowly subsides.  She has a history of being on bisphosphonates for 8 years.  Recent DEXA scan January 2020 showed bone density quite poor at -3.9.  Diagnostic testing: Rib XR- 06/09/19; Chest CT- 06/16/19   Pertinent review of systems: No fevers or chills  Relevant historical information: Parkinson's disease, hypothyroidism, neuropathy, osteoporosis, history of breast cancer   Exam:  BP 130/78 (BP Location: Right Arm, Patient Position: Sitting, Cuff Size: Normal)   Pulse 91   Ht 5\' 4"  (1.626 m)   Wt 139 lb 3.2 oz (63.1 kg)   SpO2 97%   BMI 23.89  kg/m  General: Well Developed, well nourished, and in no acute distress.   MSK:  Right shoulder normal-appearing nontender. Normal shoulder motion however palpable click and pop at medial and inferior margin of scapula.  Some scapular protraction also present. Shoulder strength intact.  Right thoracic rib cage nontender.     Assessment and Plan: 79 y.o. female with   Right eighth rib posterior lateral rib fracture: Effectively resolved at this point.  Watchful waiting.  Popping and pain at scapula: Patient has quite a bit of scapular dysfunction which is likely the main cause of her pain.  Plan for physical therapy referral.  Recheck if not improving.  Arthralgia due to Prolia: Known side effect with about a 10% rate.  We discussed other potential options other than Prolia.  Evenity may be an option however that also has a similar side effect rate of arthralgia.  I provided her with information for this medication and asked her to think about it.  Happy to arrange for this as needed in the future.  Additionally PCP may be able to do this as well.  Leg twitching cramping: Possibly restless leg syndrome.  Gabapentin was not helpful.  Will try baclofen at bedtime.  Caution against sedation.  Will start very low-dose Baclofen first.  Requip is an obvious option here but she is already on Sinemet which works on the dopamine  system.  Would like to avoid using a dopamine agonist in the situation unless needed.  Recheck about 1 month.  PDMP not reviewed this encounter. Orders Placed This Encounter  Procedures  . [place] LIMITED JOINT SPACE STRUCTURES UP RIGHT(NO LINKED CHARGES)    Order Specific Question:   Reason for Exam (SYMPTOM  OR DIAGNOSIS REQUIRED)    Answer:   R shoulder pain    Order Specific Question:   Preferred imaging location?    Answer:   [facility]  . Ambulatory referral to Physical Therapy    Referral Priority:   Routine    Referral Type:   Physical  Medicine    Referral Reason:   Specialty Services Required    Requested Specialty:   Physical Therapy   Meds ordered this encounter  Medications  . Baclofen 5 MG TABS    Sig: Take 5-10 mg by mouth at bedtime as needed (leg cramping or jerking).    Dispense:  60 tablet    Refill:  1     Discussed warning signs or symptoms. Please see discharge instructions. Patient expresses understanding.   The above documentation has been reviewed and is accurate and complete Lynne Leader

## 2019-07-25 ENCOUNTER — Other Ambulatory Visit: Payer: Self-pay

## 2019-07-25 ENCOUNTER — Other Ambulatory Visit: Payer: Self-pay | Admitting: Allergy and Immunology

## 2019-07-25 ENCOUNTER — Telehealth: Payer: Self-pay | Admitting: Family Medicine

## 2019-07-25 ENCOUNTER — Ambulatory Visit (INDEPENDENT_AMBULATORY_CARE_PROVIDER_SITE_OTHER): Payer: Medicare HMO

## 2019-07-25 DIAGNOSIS — J309 Allergic rhinitis, unspecified: Secondary | ICD-10-CM

## 2019-07-25 MED ORDER — FLUTICASONE PROPIONATE 50 MCG/ACT NA SUSP: 2.0000 | Freq: Every day | NASAL | 2 refills | Status: DC | PRN

## 2019-07-25 NOTE — Telephone Encounter (Signed)
Patient is requesting a refill for Flonase. Julia [name], [place]. Last seen 09-20-18. She is coming in today and I will make an appointment for her then.

## 2019-07-25 NOTE — Telephone Encounter (Signed)
If patient calls back, she just needs to get scheduled for PT.

## 2019-07-25 NOTE — Telephone Encounter (Signed)
Patient was last seen on 09/20/2018 and is to return in a year. Refills sent in. Will inform patient when she comes in for her allergy injections as well as make her follow up appointment.

## 2019-07-27 DIAGNOSIS — J3089 Other allergic rhinitis: Secondary | ICD-10-CM

## 2019-07-27 NOTE — Progress Notes (Signed)
Vials exp 07-26-20

## 2019-07-31 DIAGNOSIS — J302 Other seasonal allergic rhinitis: Secondary | ICD-10-CM

## 2019-08-01 ENCOUNTER — Other Ambulatory Visit: Payer: Self-pay

## 2019-08-01 ENCOUNTER — Ambulatory Visit (INDEPENDENT_AMBULATORY_CARE_PROVIDER_SITE_OTHER): Payer: Medicare HMO

## 2019-08-01 DIAGNOSIS — J309 Allergic rhinitis, unspecified: Secondary | ICD-10-CM

## 2019-08-02 ENCOUNTER — Encounter: Payer: Self-pay | Admitting: Physical Therapy

## 2019-08-02 ENCOUNTER — Ambulatory Visit (INDEPENDENT_AMBULATORY_CARE_PROVIDER_SITE_OTHER): Payer: Medicare HMO | Admitting: Physical Therapy

## 2019-08-02 DIAGNOSIS — M6281 Muscle weakness (generalized): Secondary | ICD-10-CM

## 2019-08-02 DIAGNOSIS — M25511 Pain in right shoulder: Secondary | ICD-10-CM | POA: Diagnosis not present

## 2019-08-02 DIAGNOSIS — M25552 Pain in left hip: Secondary | ICD-10-CM | POA: Diagnosis not present

## 2019-08-02 NOTE — Patient Instructions (Signed)
Access Code: [email or web address] URL: [email or web address] Date: 08/02/2019 Prepared by: Lyndee Hensen  Exercises Seated Shoulder Rolls - 2-3 x daily - 1 sets - 10 reps Seated Scapular Retraction - 2-3 x daily - 1 sets - 10 reps

## 2019-08-03 ENCOUNTER — Encounter: Payer: Self-pay | Admitting: Physical Therapy

## 2019-08-03 ENCOUNTER — Ambulatory Visit (INDEPENDENT_AMBULATORY_CARE_PROVIDER_SITE_OTHER): Payer: Medicare HMO | Admitting: Physical Therapy

## 2019-08-03 ENCOUNTER — Other Ambulatory Visit: Payer: Self-pay

## 2019-08-03 DIAGNOSIS — M25552 Pain in left hip: Secondary | ICD-10-CM | POA: Diagnosis not present

## 2019-08-03 DIAGNOSIS — M6281 Muscle weakness (generalized): Secondary | ICD-10-CM

## 2019-08-03 DIAGNOSIS — M25511 Pain in right shoulder: Secondary | ICD-10-CM | POA: Diagnosis not present

## 2019-08-03 NOTE — Therapy (Signed)
[facility] [address] [place], [name], [address] Phone: [phone]   Fax:  [phone]  Physical Therapy Treatment  Patient Details  Name: Julia [name] MRN: [id number] Date of Birth: 27-Mar-1941 Referring Provider (PT): Lynne Leader   Encounter Date: 08/03/2019  PT End of Session - 08/03/19 1309    Visit Number  2    Number of Visits  12    Date for PT Re-Evaluation  09/13/19    Authorization Type  [employer]    PT Start Time  1215    PT Stop Time  1258    PT Time Calculation (min)  43 min    Activity Tolerance  Patient tolerated treatment well    Behavior During Therapy  [facility] for tasks assessed/performed       Past Medical History:  Diagnosis Date  . Arthritis   . Breast cancer ([place])   . Colon cancer ([place])    previous colon cancer  . Hyperlipidemia   . 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 RLS (restless legs syndrome) 07/14/2018  . 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 ADENOIDECTOMY    . BREAST LUMPECTOMY Left    radiation only  . BUNIONECTOMY    . COLON SURGERY    . HEMORRHOID SURGERY    . LAPAROSCOPY    . NASAL SINUS SURGERY    . OOPHORECTOMY    . TONSILLECTOMY    . TUBAL LIGATION      There were no vitals filed for this visit.  Subjective Assessment - 08/03/19 1307    Subjective  Pt reports not sleeping well last night, has tremors from parkinsons.    Currently in Pain?  Yes    Pain Score  4     Pain Location  Shoulder    Pain Orientation  Right    Pain Descriptors / Indicators  Aching    Pain Type  Acute pain    Pain Onset  More than a month ago    Pain Frequency  Intermittent    Pain Score  5    Pain Location  Hip    Pain Orientation  Left    Pain Descriptors / Indicators  Aching;Burning    Pain Type  Acute pain    Pain Onset  More than a month ago    Pain Frequency  Intermittent                        [facility] Adult PT Treatment/Exercise - 08/03/19 1219      Knee/Hip Exercises: Stretches   Piriformis Stretch  3 reps;30 seconds    Piriformis Stretch Limitations  supine mod fig 4    Other Knee/Hip Stretches  SKTC 30 sec x 2 bil;       Knee/Hip Exercises: Standing   Hip Abduction  2 sets;10 reps      Knee/Hip Exercises: Supine   Other Supine Knee/Hip Exercises  Clam RTB x20;       Shoulder Exercises: Supine   Protraction  15 reps    Protraction Limitations  SA press/cane    Horizontal ABduction  12 reps;AROM    Flexion  AAROM;15 reps    Flexion Limitations  cane      Shoulder Exercises: Standing   Row  20 reps    Theraband Level (Shoulder Row)  Level 2 (Red)      Shoulder  Exercises: Pulleys   Flexion  2 minutes    ABduction  1 minute      Manual Therapy   Manual Therapy  Joint mobilization;Soft tissue mobilization;Passive ROM    Soft tissue mobilization  STM/roller to L hip, gr troch glute and ITB    Passive ROM  R shoulder and L hip all motions                PT Short Term Goals - 08/03/19 0954      PT SHORT TERM GOAL #1   Title  Pt to be independent with initial HEP    Time  2    Period  Weeks    Status  New    Target Date  08/16/19      PT SHORT TERM GOAL #2   Title  Pt to demo decreased pain in L hip to 0-4/10 with activiity    Time  3    Period  Weeks    Status  New    Target Date  08/23/19        PT Long Term Goals - 08/03/19 0957      PT LONG TERM GOAL #1   Title  Pt to be independent with final HEp    Time  6    Period  Weeks    Status  New    Target Date  09/13/19      PT LONG TERM GOAL #2   Title  Pt to report decreased pain in L hip to 0-2/10 with activity and walking    Time  6    Period  Weeks    Status  New    Target Date  09/13/19      PT LONG TERM GOAL #3   Title  Pt to demo decreased pain in R shoulder/scapular region to 0-2/10 with reaching, and IADLS.    Time  6    Period  Weeks     Status  New    Target Date  09/13/19      PT LONG TERM GOAL #4   Title  Pt to demo ability for full shoulder AROM bilaterally, without pain,to improve ability for ADLS    Time  6    Period  Weeks    Status  New    Target Date  09/13/19      PT LONG TERM GOAL #5   Title  Pt to demo improved strength of bil shoulders and scapular muscles to at least 4/5 to improve ability for reaching, lifting, and IADLS.    Time  6    Period  Weeks    Status  New    Target Date  09/13/19            Plan - 08/03/19 1310    Clinical Impression Statement  Pt educated on ther ex for initial HEP for shoulder and Hip today. No crepitus noted in shoulders with AAROM today, plan to progress strengthening as able. Tenderness in L gr troch and ITB region in hip noted. Plan to progress hip strength for HEP.    Personal Factors and Comorbidities  Comorbidity 1    Comorbidities  L hip pain, neuromas, R shoulder pain, scapular dysfunction, previous rib fractures, Osteoporosis, Breast and colon cancer , parkinsons    Examination-Activity Limitations  Bathing;Locomotion Level;Transfers;Bed Mobility;Reach Overhead;Sleep;Bend;Lift;Stand;Stairs    Examination-Participation Restrictions  Meal Prep;Cleaning;Community Activity;Shop;Driving;Laundry;Yard Work    Database administrator  Good    PT Frequency  2x / week    PT Duration  6 weeks    PT Treatment/Interventions  ADLs/Self Care Home Management;Cryotherapy;Electrical Stimulation;Gait training;DME Instruction;Contrast Bath;Ultrasound;Traction;Moist Heat;Iontophoresis 4mg /ml Dexamethasone;Stair training;Functional mobility training;Therapeutic activities;Therapeutic exercise;Balance training;Neuromuscular re-education;Manual techniques;Orthotic Fit/Training;Patient/family education;Passive range of motion;Dry needling;Taping;Spinal Manipulations;Joint Manipulations    PT Home Exercise Plan  [email or web address]     Consulted and Agree with Plan of Care  Patient       Patient will benefit from skilled therapeutic intervention in order to improve the following deficits and impairments:  Abnormal gait, Decreased range of motion, Difficulty walking, Increased muscle spasms, Decreased activity tolerance, Pain, Improper body mechanics, Impaired flexibility, Decreased balance, Decreased mobility, Decreased strength  Visit Diagnosis: Acute pain of right shoulder  Muscle weakness (generalized)  Pain in left hip     Problem List Patient Active Problem List   Diagnosis Date Noted  . Pulmonary nodule 06/20/2019  . Statin intolerance 10/13/2018  . Pelvic floor dysfunction 08/26/2018  . Fecal smearing 08/26/2018  . Fecal urgency 08/26/2018  . RLS (restless legs syndrome) 07/14/2018  . 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 Female bladder prolapse 04/07/2018  . 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 Perennial and seasonal allergic rhinitis 02/01/2018  . Anxiety 09/29/2017  . Parkinson's disease ([place]) 09/29/2017  . Dysfunctional voiding of urine 09/13/2016  . Female stress incontinence 09/13/2016  . 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 Gastroesophageal reflux disease 09/13/2016  . Age-related osteoporosis without current pathological fracture 07/20/2016  . 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 Hyperlipidemia 03/07/2015  . Impaired fasting glucose 03/07/2015  . Atrophic vaginitis 02/13/2015  . History of colon cancer 02/13/2015  . Hypothyroidism due to Hashimoto's thyroiditis 02/13/2015  . Irritable bowel syndrome with both constipation and diarrhea 02/13/2015  . Malignant neoplasm of female breast ([place]) 02/13/2015  . Recurrent UTI 02/13/2015  . Vitamin D deficiency disease 02/13/2015    Lyndee Hensen, PT, DPT 1:11 PM  08/03/19    [facility] [place], [name], [address] Phone: [phone]    Fax:  [phone]  Name: Darlene Woode MRN: [id number] Date of Birth: 1941-01-03

## 2019-08-03 NOTE — Therapy (Signed)
Julia [name] [address] [place], [name], [address] Phone: [phone]   Fax:  [phone]  Physical Therapy Evaluation  Patient Details  Name: Julia [name] MRN: [id number] Date of Birth: 08/13/1940 Referring Provider (PT): Julia [name]   Encounter Date: 08/02/2019  PT End of Session - 08/03/19 0945    Visit Number  1    Number of Visits  12    Date for PT Re-Evaluation  09/13/19    Authorization Type  [employer] Medicare    PT Start Time  [id number]    PT Stop Time  1425    PT Time Calculation (min)  40 min    Activity Tolerance  Patient tolerated treatment well    Behavior During Therapy  [facility] for tasks assessed/performed       Past Medical History:  Diagnosis Date  . Arthritis   . Breast cancer ([place])   . Colon cancer ([place])    previous colon cancer  . Hyperlipidemia   . 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 RLS (restless legs syndrome) 07/14/2018  . Secondary malignant neoplasm of unspecified ovary [facility])     Past Surgical History:  Procedure Laterality Date  . ADENOIDECTOMY    . BREAST LUMPECTOMY Left    radiation only  . BUNIONECTOMY    . COLON SURGERY    . HEMORRHOID SURGERY    . LAPAROSCOPY    . NASAL SINUS SURGERY    . OOPHORECTOMY    . TONSILLECTOMY    . TUBAL LIGATION      There were no vitals filed for this visit.   Subjective Assessment - 08/02/19 1351    Subjective  Pt had increased pain in R side about 1 month ago, no injury. Was found to have rib fracture, that is now healed. Most pain in anterior shoulder. Is R handed. normally exercises at home, does a video for parkinsons. Pt states very bothersome popping in shoulder blade region with shoulder movement and reaching. ALso states bothersome L hip pain, thinks it is from [facility], has increased pain and stiffness with sitting, pain with intiial standing and increased activity. ALso states bil neuromas in feet, bothersome, burning pain at night.    Limitations   Lifting;House hold activities    Patient Stated Goals  decreased pain/popping    Currently in Pain?  Yes    Pain Score  5     Pain Location  Shoulder    Pain Orientation  Right    Pain Descriptors / Indicators  Aching    Pain Type  Acute pain    Pain Onset  More than a month ago    Pain Frequency  Intermittent    Aggravating Factors   UE movment, activitiy    Pain Relieving Factors  rest    Multiple Pain Sites  Yes    Pain Score  5    Pain Location  Hip    Pain Orientation  Left    Pain Descriptors / Indicators  Burning;Aching    Pain Type  Acute pain    Pain Onset  More than a month ago    Pain Frequency  Intermittent         [facility] PT Assessment - 08/03/19 0001      Assessment   Medical Diagnosis  R shoulder pain, L hip pain    Referring Provider (PT)  Julia [name]    Hand Dominance  Right    Prior Therapy  no  Balance Screen   Has the patient fallen in the past 6 months  No      Prior Function   Level of Independence  Independent      Cognition   Overall Cognitive Status  Within Functional Limits for tasks assessed      Posture/Postural Control   Posture Comments  thoracic/lumbar scoliosis, thoracic kyphosis, rounded shoulders, forward head      AROM   Overall AROM Comments  L flexion: 130     Right Shoulder Flexion  150 Degrees    Right Shoulder ABduction  105 Degrees    Right Shoulder Internal Rotation  --   wnl   Right Shoulder External Rotation  --   wnl     PROM   Overall PROM Comments  BIl Shoulder PROM: WNL, Hips: WNL       Strength   Overall Strength Comments  Scapular: 3+/5     Strength Assessment Site  Hip    Right Shoulder Flexion  4-/5    Right Shoulder ABduction  4-/5    Right Shoulder Internal Rotation  4/5    Right Shoulder External Rotation  4/5    Right/Left Hip  Left    Left Hip Flexion  4/5    Left Hip External Rotation  4/5    Left Hip Internal Rotation  4/5    Left Hip ABduction  4-/5      Palpation   Palpation comment   Painful L greater trochanter and proximal ITB, into L glute.;  Mild pain in R anterior shoulder,       Special Tests   Other special tests  Scapular dyskinesia on R vs L with shoulder ROM,  Crepitus in bil scapula with active shoulder flexion and abduction;                 Objective measurements completed on examination: See above findings.      [facility] Adult PT Treatment/Exercise - 08/03/19 0001      Exercises   Exercises  Shoulder;Knee/Hip      Shoulder Exercises: Seated   Other Seated Exercises  shoulder rolls x15: scap squeeze x15;              PT Education - 08/03/19 [id number]    Education Details  [email or web address]   PT POC, Exam findings.    Person(s) Educated  Patient    Methods  Explanation;Demonstration;Tactile cues;Verbal cues;Handout    Comprehension  Verbalized understanding;Returned demonstration;Verbal cues required;Tactile cues required;Need further instruction       PT Short Term Goals - 08/03/19 0954      PT SHORT TERM GOAL #1   Title  Pt to be independent with initial HEP    Time  2    Period  Weeks    Status  New    Target Date  08/16/19      PT SHORT TERM GOAL #2   Title  Pt to demo decreased pain in L hip to 0-4/10 with activiity    Time  3    Period  Weeks    Status  New    Target Date  08/23/19        PT Long Term Goals - 08/03/19 0957      PT LONG TERM GOAL #1   Title  Pt to be independent with final HEp    Time  6    Period  Weeks    Status  New    Target Date  09/13/19  PT LONG TERM GOAL #2   Title  Pt to report decreased pain in L hip to 0-2/10 with activity and walking    Time  6    Period  Weeks    Status  New    Target Date  09/13/19      PT LONG TERM GOAL #3   Title  Pt to demo decreased pain in R shoulder/scapular region to 0-2/10 with reaching, and IADLS.    Time  6    Period  Weeks    Status  New    Target Date  09/13/19      PT LONG TERM GOAL #4   Title  Pt to demo ability for full shoulder AROM  bilaterally, without pain,to improve ability for ADLS    Time  6    Period  Weeks    Status  New    Target Date  09/13/19      PT LONG TERM GOAL #5   Title  Pt to demo improved strength of bil shoulders and scapular muscles to at least 4/5 to improve ability for reaching, lifting, and IADLS.    Time  6    Period  Weeks    Status  New    Target Date  09/13/19             Plan - 08/03/19 0947    Clinical Impression Statement  Pt presents with primary complaint of increased pain and weakness in R shouder, shoulder blade region, with increased crepitus. Pt with crepitus bilaterally in scapular region, with active UE ROM. She has weakness in bil shoulder and scapular muscles. Pt also with decreased posural awareness for ADLS and IADLs. Pt also has L hip pain, tenderness in greater trochanter and ITB region, with mild weakness in hips. Pt to benefit from skilled PT to improve deficits and pain.    Personal Factors and Comorbidities  Comorbidity 1    Comorbidities  L hip pain, neuromas, R shoulder pain, scapular dysfunction, previous rib fractures, Osteoporosis, Breast and colon cancer .    Examination-Activity Limitations  Bathing;Locomotion Level;Transfers;Bed Mobility;Reach Overhead;Sleep;Bend;Lift;Stand;Stairs    Examination-Participation Restrictions  Meal Prep;Cleaning;Community Activity;Shop;Driving;Laundry;Yard Work    [employer]  Evolving/Moderate complexity    Clinical Decision Making  Moderate    Rehab Potential  Good    PT Frequency  2x / week    PT Duration  6 weeks    PT Treatment/Interventions  ADLs/Self Care Home Management;Cryotherapy;Electrical Stimulation;Gait training;DME Instruction;Contrast Bath;Ultrasound;Traction;Moist Heat;Iontophoresis 4mg /ml Dexamethasone;Stair training;Functional mobility training;Therapeutic activities;Therapeutic exercise;Balance training;Neuromuscular re-education;Manual techniques;Orthotic Fit/Training;Patient/family  education;Passive range of motion;Dry needling;Taping;Spinal Manipulations;Joint Manipulations    PT Home Exercise Plan  [email or web address]    Consulted and Agree with Plan of Care  Patient       Patient will benefit from skilled therapeutic intervention in order to improve the following deficits and impairments:  Abnormal gait, Decreased range of motion, Difficulty walking, Increased muscle spasms, Decreased activity tolerance, Pain, Improper body mechanics, Impaired flexibility, Decreased balance, Decreased mobility, Decreased strength  Visit Diagnosis: Acute pain of right shoulder  Muscle weakness (generalized)  Pain in left hip     Problem List Patient Active Problem List   Diagnosis Date Noted  . Pulmonary nodule 06/20/2019  . Statin intolerance 10/13/2018  . Pelvic floor dysfunction 08/26/2018  . Fecal smearing 08/26/2018  . Fecal urgency 08/26/2018  . RLS (restless legs syndrome) 07/14/2018  . 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 Female bladder prolapse 04/07/2018  . 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 Perennial and seasonal allergic rhinitis 02/01/2018  . Anxiety 09/29/2017  . Parkinson's disease ([place]) 09/29/2017  . Dysfunctional voiding of urine 09/13/2016  . Female stress incontinence 09/13/2016  . 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 Gastroesophageal reflux disease 09/13/2016  . Age-related osteoporosis without current pathological fracture 07/20/2016  . 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 Hyperlipidemia 03/07/2015  . Impaired fasting glucose 03/07/2015  . Atrophic vaginitis 02/13/2015  . History of colon cancer 02/13/2015  . Hypothyroidism due to Hashimoto's thyroiditis 02/13/2015  . Irritable bowel syndrome with both constipation and diarrhea 02/13/2015  . Malignant neoplasm of female breast ([place]) 02/13/2015  . Recurrent UTI 02/13/2015  . Vitamin D deficiency disease 02/13/2015   Lyndee Hensen, PT, DPT 11:22 AM   08/03/19    [facility] [place], [name], [address] Phone: [phone]   Fax:  [phone]  Name: Julia [name] MRN: [id number] Date of Birth: 1940/10/14

## 2019-08-05 ENCOUNTER — Other Ambulatory Visit: Payer: Self-pay | Admitting: Neurology

## 2019-08-08 ENCOUNTER — Ambulatory Visit (INDEPENDENT_AMBULATORY_CARE_PROVIDER_SITE_OTHER): Payer: Medicare HMO

## 2019-08-08 ENCOUNTER — Other Ambulatory Visit: Payer: Self-pay

## 2019-08-08 DIAGNOSIS — J309 Allergic rhinitis, unspecified: Secondary | ICD-10-CM | POA: Diagnosis not present

## 2019-08-10 ENCOUNTER — Encounter: Payer: Medicare HMO | Admitting: Physical Therapy

## 2019-08-14 ENCOUNTER — Ambulatory Visit (INDEPENDENT_AMBULATORY_CARE_PROVIDER_SITE_OTHER): Payer: Medicare HMO | Admitting: Physical Therapy

## 2019-08-14 ENCOUNTER — Encounter: Payer: Self-pay | Admitting: Physical Therapy

## 2019-08-14 ENCOUNTER — Other Ambulatory Visit: Payer: Self-pay

## 2019-08-14 DIAGNOSIS — M6281 Muscle weakness (generalized): Secondary | ICD-10-CM

## 2019-08-14 DIAGNOSIS — M25552 Pain in left hip: Secondary | ICD-10-CM | POA: Diagnosis not present

## 2019-08-14 DIAGNOSIS — M25511 Pain in right shoulder: Secondary | ICD-10-CM | POA: Diagnosis not present

## 2019-08-14 NOTE — Patient Instructions (Signed)
Access Code: [email or web address] URL: [email or web address] Date: 08/02/2019 Prepared by: Lyndee Hensen  Exercises Seated Shoulder Rolls - 2-3 x daily - 1 sets - 10 reps Seated Scapular Retraction - 2-3 x daily - 1 sets - 10 reps Supine Shoulder Flexion Extension AAROM with Dowel - 1 x daily - 1-2 sets - 10 reps Supine Piriformis Stretch Pulling Heel to Hip - 2 x daily - 3 reps - 30 hold Supine Single Knee to Chest Stretch - 2 x daily - 3 reps - 30 hold Hooklying Clamshell with Resistance - 1 x daily - 2 sets - 10 reps Standing Hip Abduction with Counter Support - 1 x daily - 2 sets - 10 reps Sidelying Hip Abduction - 1 x daily - 2 sets - 10 reps Standing Shoulder Scaption - 1 x daily - 1 sets - 10 reps

## 2019-08-14 NOTE — Therapy (Signed)
[facility] [address] [place], [name], [address] Phone: [phone]   Fax:  [phone]  Physical Therapy Treatment  Patient Details  Name: Julia [name] MRN: [id number] Date of Birth: Dec 18, 1940 Referring Provider (PT): Lynne Leader   Encounter Date: 08/14/2019  PT End of Session - 08/14/19 1038    Visit Number  3    Number of Visits  12    Date for PT Re-Evaluation  09/13/19    Authorization Type  Aetna Medicare    PT Start Time  0932    PT Stop Time  1027    PT Time Calculation (min)  55 min    Activity Tolerance  Patient tolerated treatment well    Behavior During Therapy  [facility] for tasks assessed/performed       Past Medical History:  Diagnosis Date  . Arthritis   . Breast cancer ([place])   . Colon cancer ([place])    previous colon cancer  . Hyperlipidemia   . 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 RLS (restless legs syndrome) 07/14/2018  . Secondary malignant neoplasm of unspecified ovary [facility])     Past Surgical History:  Procedure Laterality Date  . ADENOIDECTOMY    . BREAST LUMPECTOMY Left    radiation only  . BUNIONECTOMY    . COLON SURGERY    . HEMORRHOID SURGERY    . LAPAROSCOPY    . NASAL SINUS SURGERY    . OOPHORECTOMY    . TONSILLECTOMY    . TUBAL LIGATION      There were no vitals filed for this visit.  Subjective Assessment - 08/14/19 1036    Subjective  Pt states ongoing pain in L hip, and in R shoulder, still hears and feels popping in shoulder blades with reaching forward. HIp bothersome at night , wakes her up    Currently in Pain?  Yes    Pain Score  4     Pain Location  Shoulder    Pain Orientation  Right    Pain Descriptors / Indicators  Aching    Pain Type  Acute pain    Pain Onset  More than a month ago    Pain Frequency  Intermittent    Pain Score  5    Pain Location  Hip    Pain Orientation  Left    Pain Descriptors / Indicators  Aching;Burning    Pain Type  Acute pain    Pain Onset   More than a month ago    Pain Frequency  Intermittent                        [facility] Adult PT Treatment/Exercise - 08/14/19 0941      Knee/Hip Exercises: Stretches   Piriformis Stretch  3 reps;30 seconds    Piriformis Stretch Limitations  supine mod fig 4    Other Knee/Hip Stretches  --      Knee/Hip Exercises: Aerobic   Recumbent Bike  L1 x 7 min       Knee/Hip Exercises: Standing   Hip Abduction  1 set;10 reps      Knee/Hip Exercises: Supine   Other Supine Knee/Hip Exercises  Clam RTB x20;       Knee/Hip Exercises: Sidelying   Hip ABduction  15 reps;Both      Shoulder Exercises: Supine   Protraction  --    Protraction Limitations  --    Horizontal ABduction  AROM;15 reps  Flexion  15 reps;AROM    Flexion Limitations  with practice for shoulder positioning       Shoulder Exercises: Standing   Flexion  AROM;15 reps    Flexion Limitations  with education on shoulder posture     ABduction  15 reps;AROM    ABduction Limitations  with education on shoulder posture     Row  20 reps    Theraband Level (Shoulder Row)  Level 2 (Red)    Other Standing Exercises  Scap retract, arms at sides x 10;       Shoulder Exercises: Pulleys   Flexion  2 minutes    ABduction  --      Manual Therapy   Manual Therapy  Joint mobilization;Soft tissue mobilization;Passive ROM    Soft tissue mobilization  DTM/IASTM to L hip, gr troch, ITB, glute     Passive ROM  manual stretch for L hip flex, IR, ER.              PT Education - 08/14/19 1038    Education Details  HEP reviewed, updated.    Person(s) Educated  Patient    Methods  Explanation    Comprehension  Verbalized understanding       PT Short Term Goals - 08/03/19 0954      PT SHORT TERM GOAL #1   Title  Pt to be independent with initial HEP    Time  2    Period  Weeks    Status  New    Target Date  08/16/19      PT SHORT TERM GOAL #2   Title  Pt to demo decreased pain in L hip to 0-4/10 with  activiity    Time  3    Period  Weeks    Status  New    Target Date  08/23/19        PT Long Term Goals - 08/03/19 0957      PT LONG TERM GOAL #1   Title  Pt to be independent with final HEp    Time  6    Period  Weeks    Status  New    Target Date  09/13/19      PT LONG TERM GOAL #2   Title  Pt to report decreased pain in L hip to 0-2/10 with activity and walking    Time  6    Period  Weeks    Status  New    Target Date  09/13/19      PT LONG TERM GOAL #3   Title  Pt to demo decreased pain in R shoulder/scapular region to 0-2/10 with reaching, and IADLS.    Time  6    Period  Weeks    Status  New    Target Date  09/13/19      PT LONG TERM GOAL #4   Title  Pt to demo ability for full shoulder AROM bilaterally, without pain,to improve ability for ADLS    Time  6    Period  Weeks    Status  New    Target Date  09/13/19      PT LONG TERM GOAL #5   Title  Pt to demo improved strength of bil shoulders and scapular muscles to at least 4/5 to improve ability for reaching, lifting, and IADLS.    Time  6    Period  Weeks    Status  New    Target Date  09/13/19            Plan - 08/14/19 1039    Clinical Impression Statement  Pt with tenderness with palpation of L hip, glute, and greater troch region. Somewhat decreased after manual and IASTM today. Pt req max cuing for back and shoulder posture with shoulder/scapular strengthening, and will benefit from continued education on this. Plan to progress as tolerated.    Personal Factors and Comorbidities  Comorbidity 1    Comorbidities  L hip pain, neuromas, R shoulder pain, scapular dysfunction, previous rib fractures, Osteoporosis, Breast and colon cancer , parkinsons    Examination-Activity Limitations  Bathing;Locomotion Level;Transfers;Bed Mobility;Reach Overhead;Sleep;Bend;Lift;Stand;Stairs    Examination-Participation Restrictions  Meal Prep;Cleaning;Community Activity;Shop;Driving;Laundry;Yard Work     [employer] officer  Evolving/Moderate complexity    Rehab Potential  Good    PT Frequency  2x / week    PT Duration  6 weeks    PT Treatment/Interventions  ADLs/Self Care Home Management;Cryotherapy;Electrical Stimulation;Gait training;DME Instruction;Contrast Bath;Ultrasound;Traction;Moist Heat;Iontophoresis 4mg /ml Dexamethasone;Stair training;Functional mobility training;Therapeutic activities;Therapeutic exercise;Balance training;Neuromuscular re-education;Manual techniques;Orthotic Fit/Training;Patient/family education;Passive range of motion;Dry needling;Taping;Spinal Manipulations;Joint Manipulations    PT Home Exercise Plan  [email or web address]    Consulted and Agree with Plan of Care  Patient       Patient will benefit from skilled therapeutic intervention in order to improve the following deficits and impairments:  Abnormal gait, Decreased range of motion, Difficulty walking, Increased muscle spasms, Decreased activity tolerance, Pain, Improper body mechanics, Impaired flexibility, Decreased balance, Decreased mobility, Decreased strength  Visit Diagnosis: Acute pain of right shoulder  Muscle weakness (generalized)  Pain in left hip     Problem List Patient Active Problem List   Diagnosis Date Noted  . Pulmonary nodule 06/20/2019  . Statin intolerance 10/13/2018  . Pelvic floor dysfunction 08/26/2018  . Fecal smearing 08/26/2018  . Fecal urgency 08/26/2018  . RLS (restless legs syndrome) 07/14/2018  . 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 Female bladder prolapse 04/07/2018  . 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 Perennial and seasonal allergic rhinitis 02/01/2018  . Anxiety 09/29/2017  . Parkinson's disease ([place]) 09/29/2017  . Dysfunctional voiding of urine 09/13/2016  . Female stress incontinence 09/13/2016  . 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 Gastroesophageal reflux disease 09/13/2016  . Age-related osteoporosis without  current pathological fracture 07/20/2016  . Drug-induced polyneuropathy ([place]) 03/07/2015  . Hyperlipidemia 03/07/2015  . Impaired fasting glucose 03/07/2015  . Atrophic vaginitis 02/13/2015  . History of colon cancer 02/13/2015  . Hypothyroidism due to Hashimoto's thyroiditis 02/13/2015  . Irritable bowel syndrome with both constipation and diarrhea 02/13/2015  . Malignant neoplasm of female breast ([place]) 02/13/2015  . Recurrent UTI 02/13/2015  . Vitamin D deficiency disease 02/13/2015    Lyndee Hensen, PT, DPT 10:44 AM  08/14/19    [facility] [place], [name], [address] Phone: [phone]   Fax:  [phone]  Name: Cecily Stpeter MRN: [id number] Date of Birth: [birth year]

## 2019-08-15 ENCOUNTER — Ambulatory Visit (INDEPENDENT_AMBULATORY_CARE_PROVIDER_SITE_OTHER): Payer: Medicare HMO

## 2019-08-15 ENCOUNTER — Ambulatory Visit: Payer: Medicare HMO | Admitting: Allergy and Immunology

## 2019-08-15 DIAGNOSIS — J309 Allergic rhinitis, unspecified: Secondary | ICD-10-CM

## 2019-08-16 ENCOUNTER — Other Ambulatory Visit: Payer: Self-pay

## 2019-08-16 ENCOUNTER — Ambulatory Visit (INDEPENDENT_AMBULATORY_CARE_PROVIDER_SITE_OTHER): Payer: Medicare HMO | Admitting: Rehabilitative and Restorative Service Providers"

## 2019-08-16 ENCOUNTER — Encounter: Payer: Self-pay | Admitting: Rehabilitative and Restorative Service Providers"

## 2019-08-16 DIAGNOSIS — M25552 Pain in left hip: Secondary | ICD-10-CM | POA: Diagnosis not present

## 2019-08-16 DIAGNOSIS — M25511 Pain in right shoulder: Secondary | ICD-10-CM | POA: Diagnosis not present

## 2019-08-16 DIAGNOSIS — M6281 Muscle weakness (generalized): Secondary | ICD-10-CM | POA: Diagnosis not present

## 2019-08-16 NOTE — Patient Instructions (Signed)
Access Code: [email or web address]: [email or web address]: 05/19/2021Prepared by: Joell Usman HoltExercises  Seated Shoulder Rolls - 2-3 x daily - 1 sets - 10 reps  Seated Scapular Retraction - 2-3 x daily - 1 sets - 10 reps  Supine Shoulder Flexion Extension AAROM with Dowel - 1 x daily - 1-2 sets - 10 reps  Supine Piriformis Stretch Pulling Heel to Hip - 2 x daily - 3 reps - 30 hold  Supine Single Knee to Chest Stretch - 2 x daily - 3 reps - 30 hold  Hooklying Clamshell with Resistance - 1 x daily - 2 sets - 10 reps  Standing Hip Abduction with Counter Support - 1 x daily - 2 sets - 10 reps  Sidelying Hip Abduction - 1 x daily - 2 sets - 10 reps  Standing Shoulder Scaption - 1 x daily - 1 sets - 10 reps  Supine Diaphragmatic Breathing - 2 x daily - 7 x weekly - 1 sets - 10 reps - 5 sec hold  Supine Chest Stretch with Elbows Bent - 2 x daily - 7 x weekly - 3 reps - 1 sets - 30 sec hold  Seated Hip Flexor Stretch - 2 x daily - 7 x weekly - 3 reps - 1 sets - 30 sec hold

## 2019-08-16 NOTE — Therapy (Signed)
[place] [address] [place], [name], [address] Phone: [phone]   Fax:  [phone]  Physical Therapy Treatment  Patient Details  Name: Julia [name] MRN: [id number] Date of Birth: Nov 13, 1940 Referring Provider (PT): Lynne Leader   Encounter Date: 08/16/2019  PT End of Session - 08/16/19 0935    Visit Number  4    Number of Visits  12    Date for PT Re-Evaluation  09/13/19    Authorization Type  [employer] Medicare    PT Start Time  0930    PT Stop Time  1018    PT Time Calculation (min)  48 min    Activity Tolerance  Patient tolerated treatment well    Behavior During Therapy  [facility] for tasks assessed/performed       Past Medical History:  Diagnosis Date  . Arthritis   . Breast cancer ([place])   . Colon cancer ([place])    previous colon cancer  . Hyperlipidemia   . 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 RLS (restless legs syndrome) 07/14/2018  . Secondary malignant neoplasm of unspecified ovary [facility])     Past Surgical History:  Procedure Laterality Date  . ADENOIDECTOMY    . BREAST LUMPECTOMY Left    radiation only  . BUNIONECTOMY    . COLON SURGERY    . HEMORRHOID SURGERY    . LAPAROSCOPY    . NASAL SINUS SURGERY    . OOPHORECTOMY    . TONSILLECTOMY    . TUBAL LIGATION      There were no vitals filed for this visit.  Subjective Assessment - 08/16/19 0936    Subjective  Patient reports that she has increased pain in the Lt hip today - not sure why.    Currently in Pain?  Yes    Pain Score  0-No pain    Pain Location  Shoulder    Pain Score  6    Pain Location  Hip    Pain Orientation  Left    Pain Descriptors / Indicators  Sharp;Tightness    Pain Type  Acute pain                        [facility] Adult PT Treatment/Exercise - 08/16/19 0001      Self-Care   Self-Care  ADL's   discussed activities at home-to avoid pulling weeds    ADL's  discussed and demo'ed sitting with pillow and noodle support  to avoid rounded posture       Therapeutic Activites    Therapeutic Activities  --   myofacial ball release work standing wall working post hip     Neuro Re-ed    Neuro Re-ed Details   working on upright posture standing - chest lift       Knee/Hip Exercises: Stretches   Hip Flexor Stretch  Right;Left;2 reps;20 seconds   seated UE support on chair    Piriformis Stretch  3 reps;30 seconds    Piriformis Stretch Limitations  supine mod fig 4      Knee/Hip Exercises: Aerobic   Recumbent Bike  L2 x 5 min       Knee/Hip Exercises: Standing   Other Standing Knee Exercises  hip extension back to wall keeping thoracic spine along wall and pushing hips away from wall 10 sec x 5 reps       Knee/Hip Exercises: Supine   Other Supine Knee/Hip Exercises  hip extension isometric single  leg opposite knee bent; 5 sec hold x 5 reps(Meeks leg lengthener)       Shoulder Exercises: Supine   Flexion  AROM;Both;5 reps    Flexion Limitations  deep breaths with pause at end range - opening chest     Other Supine Exercises  hands behind head holding for 5 deep breaths slow inhale and exhale through the nose       Shoulder Exercises: Seated   Other Seated Exercises  chin tuck 10 sec x 5       Shoulder Exercises: Standing   Other Standing Exercises  Scap retract, arms at sides x 10 noodle along spine        Manual Therapy   Soft tissue mobilization  STM bilat hip flexors/psoas/transverse abdominals; Lt posterior hip gluts and piriformis              PT Education - 08/16/19 1043    Education Details  HEP    Person(s) Educated  Patient    Methods  Explanation;Demonstration;Tactile cues;Verbal cues;Handout   hand writen notes provided   Comprehension  Verbalized understanding;Returned demonstration;Verbal cues required;Tactile cues required       PT Short Term Goals - 08/03/19 0954      PT SHORT TERM GOAL #1   Title  Pt to be independent with initial HEP    Time  2    Period  Weeks     Status  New    Target Date  08/16/19      PT SHORT TERM GOAL #2   Title  Pt to demo decreased pain in L hip to 0-4/10 with activiity    Time  3    Period  Weeks    Status  New    Target Date  08/23/19        PT Long Term Goals - 08/03/19 0957      PT LONG TERM GOAL #1   Title  Pt to be independent with final HEp    Time  6    Period  Weeks    Status  New    Target Date  09/13/19      PT LONG TERM GOAL #2   Title  Pt to report decreased pain in L hip to 0-2/10 with activity and walking    Time  6    Period  Weeks    Status  New    Target Date  09/13/19      PT LONG TERM GOAL #3   Title  Pt to demo decreased pain in R shoulder/scapular region to 0-2/10 with reaching, and IADLS.    Time  6    Period  Weeks    Status  New    Target Date  09/13/19      PT LONG TERM GOAL #4   Title  Pt to demo ability for full shoulder AROM bilaterally, without pain,to improve ability for ADLS    Time  6    Period  Weeks    Status  New    Target Date  09/13/19      PT LONG TERM GOAL #5   Title  Pt to demo improved strength of bil shoulders and scapular muscles to at least 4/5 to improve ability for reaching, lifting, and IADLS.    Time  6    Period  Weeks    Status  New    Target Date  09/13/19  Plan - 08/16/19 1037    Clinical Impression Statement  Patient presents with significnt muscular tightness through bilat hip flexors and abs - tolerated moderate pressure with soft tissue work through psoas and transverse abdominals. Continued work through the [employer] posterior hip and [place]. Added hip flexor stretch and work on diaphragmatic breathing in supine. Continued work on posture and alignment with improved upright posture noted at end of session.    Rehab Potential  Good    PT Frequency  2x / week    PT Duration  6 weeks    PT Treatment/Interventions  ADLs/Self Care Home Management;Cryotherapy;Electrical Stimulation;Gait training;DME Instruction;Contrast  Bath;Ultrasound;Traction;Moist Heat;Iontophoresis 4mg /ml Dexamethasone;Stair training;Functional mobility training;Therapeutic activities;Therapeutic exercise;Balance training;Neuromuscular re-education;Manual techniques;Orthotic Fit/Training;Patient/family education;Passive range of motion;Dry needling;Taping;Spinal Manipulations;Joint Manipulations    PT Home Exercise Plan  [id number]       Patient will benefit from skilled therapeutic intervention in order to improve the following deficits and impairments:     Visit Diagnosis: Acute pain of right shoulder  Muscle weakness (generalized)  Pain in left hip     Problem List Patient Active Problem List   Diagnosis Date Noted  . Pulmonary nodule 06/20/2019  . Statin intolerance 10/13/2018  . Pelvic floor dysfunction 08/26/2018  . Fecal smearing 08/26/2018  . Fecal urgency 08/26/2018  . RLS (restless legs syndrome) 07/14/2018  . 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 Female bladder prolapse 04/07/2018  . 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 Perennial and seasonal allergic rhinitis 02/01/2018  . Anxiety 09/29/2017  . Parkinson's disease ([place]) 09/29/2017  . Dysfunctional voiding of urine 09/13/2016  . Female stress incontinence 09/13/2016  . 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 Gastroesophageal reflux disease 09/13/2016  . Age-related osteoporosis without current pathological fracture 07/20/2016  . 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 Hyperlipidemia 03/07/2015  . Impaired fasting glucose 03/07/2015  . Atrophic vaginitis 02/13/2015  . History of colon cancer 02/13/2015  . Hypothyroidism due to Hashimoto's thyroiditis 02/13/2015  . Irritable bowel syndrome with both constipation and diarrhea 02/13/2015  . Malignant neoplasm of female breast ([place]) 02/13/2015  . Recurrent UTI 02/13/2015  . Vitamin D deficiency disease 02/13/2015    Hayes Rehfeldt Nilda Simmer PT, MPH  08/16/2019, 10:45 AM  [place], [name], [address] Phone: [phone]   Fax:  [phone]  Name: Julia [name] MRN: [id number] Date of Birth: 06-06-1940

## 2019-08-17 ENCOUNTER — Ambulatory Visit: Payer: Medicare HMO | Admitting: Allergy

## 2019-08-18 ENCOUNTER — Ambulatory Visit: Payer: Medicare HMO | Admitting: Family Medicine

## 2019-08-21 ENCOUNTER — Ambulatory Visit: Payer: Medicare HMO | Admitting: Family Medicine

## 2019-08-22 ENCOUNTER — Ambulatory Visit (INDEPENDENT_AMBULATORY_CARE_PROVIDER_SITE_OTHER): Payer: Medicare HMO

## 2019-08-22 ENCOUNTER — Encounter: Payer: Self-pay | Admitting: Physical Therapy

## 2019-08-22 ENCOUNTER — Ambulatory Visit (INDEPENDENT_AMBULATORY_CARE_PROVIDER_SITE_OTHER): Payer: Medicare HMO | Admitting: Physical Therapy

## 2019-08-22 ENCOUNTER — Other Ambulatory Visit: Payer: Self-pay

## 2019-08-22 DIAGNOSIS — M6281 Muscle weakness (generalized): Secondary | ICD-10-CM | POA: Diagnosis not present

## 2019-08-22 DIAGNOSIS — J309 Allergic rhinitis, unspecified: Secondary | ICD-10-CM | POA: Diagnosis not present

## 2019-08-22 DIAGNOSIS — M25511 Pain in right shoulder: Secondary | ICD-10-CM

## 2019-08-22 DIAGNOSIS — M25552 Pain in left hip: Secondary | ICD-10-CM | POA: Diagnosis not present

## 2019-08-23 ENCOUNTER — Encounter: Payer: Self-pay | Admitting: Physical Therapy

## 2019-08-23 ENCOUNTER — Ambulatory Visit (INDEPENDENT_AMBULATORY_CARE_PROVIDER_SITE_OTHER): Payer: Medicare HMO | Admitting: Physical Therapy

## 2019-08-23 DIAGNOSIS — M25552 Pain in left hip: Secondary | ICD-10-CM

## 2019-08-23 DIAGNOSIS — M25511 Pain in right shoulder: Secondary | ICD-10-CM | POA: Diagnosis not present

## 2019-08-23 DIAGNOSIS — M6281 Muscle weakness (generalized): Secondary | ICD-10-CM

## 2019-08-23 NOTE — Therapy (Signed)
[place] [address] [place], [name], [address] Phone: [phone]   Fax:  [phone]  Physical Therapy Treatment  Patient Details  Name: Julia [name] MRN: [id number] Date of Birth: 1940-06-07 Referring Provider (PT): Lynne Leader   Encounter Date: 08/22/2019  PT End of Session - 08/22/19 1034    Visit Number  5    Number of Visits  12    Date for PT Re-Evaluation  09/13/19    Authorization Type  Aetna Medicare    PT Start Time  [id number]    PT Stop Time  1101    PT Time Calculation (min)  38 min    Activity Tolerance  Patient tolerated treatment well    Behavior During Therapy  [facility] for tasks assessed/performed       Past Medical History:  Diagnosis Date  . Arthritis   . Breast cancer ([place])   . Colon cancer ([place])    previous colon cancer  . Hyperlipidemia   . 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 RLS (restless legs syndrome) 07/14/2018  . Secondary malignant neoplasm of unspecified ovary [facility])     Past Surgical History:  Procedure Laterality Date  . ADENOIDECTOMY    . BREAST LUMPECTOMY Left    radiation only  . BUNIONECTOMY    . COLON SURGERY    . HEMORRHOID SURGERY    . LAPAROSCOPY    . NASAL SINUS SURGERY    . OOPHORECTOMY    . TONSILLECTOMY    . TUBAL LIGATION      There were no vitals filed for this visit.  Subjective Assessment - 08/22/19 1031    Subjective  Pt states improved pain in L hip.  R shoulder still hurting with increased UE activity and bending/reaching.    Currently in Pain?  Yes    Pain Score  3     Pain Location  Shoulder    Pain Orientation  Right    Pain Descriptors / Indicators  Aching    Pain Type  Acute pain    Pain Onset  More than a month ago    Pain Frequency  Intermittent    Pain Score  2    Pain Location  Hip    Pain Orientation  Left    Pain Descriptors / Indicators  Aching    Pain Type  Acute pain    Pain Onset  More than a month ago    Pain Frequency  Intermittent                         [facility] Adult PT Treatment/Exercise - 08/22/19 1034      Self-Care   Self-Care  Posture    Posture  edcuation on seated, standing posutre. practice and education on posture for IADLS, cleaning , for shoulder posture and functional squat posture  x 10 min;       Knee/Hip Exercises: Stretches   Hip Flexor Stretch  --   seated UE support on chair    Piriformis Stretch  --    Piriformis Stretch Limitations  --      Knee/Hip Exercises: Aerobic   Recumbent Bike  L1 x 8 min       Knee/Hip Exercises: Standing   Knee Flexion  20 reps    Hip Abduction  10 reps;2 sets    Functional Squat  15 reps    Other Standing Knee Exercises  --  Knee/Hip Exercises: Seated   Long Arc Quad  20 reps      Knee/Hip Exercises: Supine   Other Supine Knee/Hip Exercises  --      Shoulder Exercises: Supine   Flexion  --    Flexion Limitations  --    Other Supine Exercises  --      Shoulder Exercises: Seated   Other Seated Exercises  chin tuck x10      Shoulder Exercises: Standing   Flexion  15 reps;Both    ABduction  15 reps;Both    Row  20 reps    Theraband Level (Shoulder Row)  Level 2 (Red)    Other Standing Exercises  --      Manual Therapy   Soft tissue mobilization  STM/DTM/ Roller to L hip, glute                PT Short Term Goals - 08/03/19 0954      PT SHORT TERM GOAL #1   Title  Pt to be independent with initial HEP    Time  2    Period  Weeks    Status  New    Target Date  08/16/19      PT SHORT TERM GOAL #2   Title  Pt to demo decreased pain in L hip to 0-4/10 with activiity    Time  3    Period  Weeks    Status  New    Target Date  08/23/19        PT Long Term Goals - 08/03/19 0957      PT LONG TERM GOAL #1   Title  Pt to be independent with final HEp    Time  6    Period  Weeks    Status  New    Target Date  09/13/19      PT LONG TERM GOAL #2   Title  Pt to report decreased pain in L hip to 0-2/10 with activity  and walking    Time  6    Period  Weeks    Status  New    Target Date  09/13/19      PT LONG TERM GOAL #3   Title  Pt to demo decreased pain in R shoulder/scapular region to 0-2/10 with reaching, and IADLS.    Time  6    Period  Weeks    Status  New    Target Date  09/13/19      PT LONG TERM GOAL #4   Title  Pt to demo ability for full shoulder AROM bilaterally, without pain,to improve ability for ADLS    Time  6    Period  Weeks    Status  New    Target Date  09/13/19      PT LONG TERM GOAL #5   Title  Pt to demo improved strength of bil shoulders and scapular muscles to at least 4/5 to improve ability for reaching, lifting, and IADLS.    Time  6    Period  Weeks    Status  New    Target Date  09/13/19            Plan - 08/23/19 1009    Clinical Impression Statement  Focus on posture education for sitting, as well as for UE use with IADLs, and functional squat position for reaching. Pt with tendency for scapular protraction for reaching vs using body. Pt will benefit from continued education  and practice with this. Tenderness in hip significantly less today.    Rehab Potential  Good    PT Frequency  2x / week    PT Duration  6 weeks    PT Treatment/Interventions  ADLs/Self Care Home Management;Cryotherapy;Electrical Stimulation;Gait training;DME Instruction;Contrast Bath;Ultrasound;Traction;Moist Heat;Iontophoresis 4mg /ml Dexamethasone;Stair training;Functional mobility training;Therapeutic activities;Therapeutic exercise;Balance training;Neuromuscular re-education;Manual techniques;Orthotic Fit/Training;Patient/family education;Passive range of motion;Dry needling;Taping;Spinal Manipulations;Joint Manipulations    PT Home Exercise Plan  [id number]       Patient will benefit from skilled therapeutic intervention in order to improve the following deficits and impairments:     Visit Diagnosis: Acute pain of right shoulder  Muscle weakness (generalized)  Pain in left  hip     Problem List Patient Active Problem List   Diagnosis Date Noted  . Pulmonary nodule 06/20/2019  . Statin intolerance 10/13/2018  . Pelvic floor dysfunction 08/26/2018  . Fecal smearing 08/26/2018  . Fecal urgency 08/26/2018  . RLS (restless legs syndrome) 07/14/2018  . 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 Female bladder prolapse 04/07/2018  . 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 Perennial and seasonal allergic rhinitis 02/01/2018  . Anxiety 09/29/2017  . Parkinson's disease ([place]) 09/29/2017  . Dysfunctional voiding of urine 09/13/2016  . Female stress incontinence 09/13/2016  . 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 Gastroesophageal reflux disease 09/13/2016  . Age-related osteoporosis without current pathological fracture 07/20/2016  . Drug-induced polyneuropathy ([place]) 03/07/2015  . Hyperlipidemia 03/07/2015  . Impaired fasting glucose 03/07/2015  . Atrophic vaginitis 02/13/2015  . History of colon cancer 02/13/2015  . Hypothyroidism due to Hashimoto's thyroiditis 02/13/2015  . Irritable bowel syndrome with both constipation and diarrhea 02/13/2015  . Malignant neoplasm of female breast ([place]) 02/13/2015  . Recurrent UTI 02/13/2015  . Vitamin D deficiency disease 02/13/2015   Lyndee Hensen, PT, DPT 10:11 AM  08/23/19    [facility] [place], [name], [address] Phone: [phone]   Fax:  [phone]  Name: Julia [name] MRN: [id number] Date of Birth: 1940-08-10

## 2019-08-23 NOTE — Therapy (Signed)
[place] [address] [place], [name], [address] Phone: [phone]   Fax:  [phone]  Physical Therapy Treatment  Patient Details  Name: Julia [name] MRN: [id number] Date of Birth: 04/06/1940 Referring Provider (PT): Lynne Leader   Encounter Date: 08/23/2019  PT End of Session - 08/23/19 1116    Visit Number  6    Number of Visits  12    Date for PT Re-Evaluation  09/13/19    Authorization Type  Aetna Medicare    PT Start Time  [id number]    PT Stop Time  1101    PT Time Calculation (min)  46 min    Activity Tolerance  Patient tolerated treatment well    Behavior During Therapy  [facility] for tasks assessed/performed       Past Medical History:  Diagnosis Date  . Arthritis   . Breast cancer ([place])   . Colon cancer ([place])    previous colon cancer  . Hyperlipidemia   . 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 RLS (restless legs syndrome) 07/14/2018  . Secondary malignant neoplasm of unspecified ovary [facility])     Past Surgical History:  Procedure Laterality Date  . ADENOIDECTOMY    . BREAST LUMPECTOMY Left    radiation only  . BUNIONECTOMY    . COLON SURGERY    . HEMORRHOID SURGERY    . LAPAROSCOPY    . NASAL SINUS SURGERY    . OOPHORECTOMY    . TONSILLECTOMY    . TUBAL LIGATION      There were no vitals filed for this visit.  Subjective Assessment - 08/23/19 1020    Subjective  Pt having a hard time sleeping, due to leg tremors with parkinsons. States hip pain at night, but minimal  today.    Currently in Pain?  Yes    Pain Score  2     Pain Location  Hip    Pain Orientation  Left    Pain Descriptors / Indicators  Aching    Pain Type  Acute pain    Pain Onset  More than a month ago    Pain Frequency  Intermittent                        [facility] Adult PT Treatment/Exercise - 08/23/19 0001      Knee/Hip Exercises: Stretches   Other Knee/Hip Stretches  SKTC 30 sec x 2 bil;     Other Knee/Hip Stretches  LTR  with UE "T" for chest stetch 10 sec x 10;       Knee/Hip Exercises: Aerobic   Recumbent Bike  L 1 x 8 min;       Knee/Hip Exercises: Supine   Bridges  20 reps    Other Supine Knee/Hip Exercises  supine march x 20;     Other Supine Knee/Hip Exercises  hip extension isometric single leg opposite knee bent; 5 sec hold x 5 reps(Meeks leg lengthener)       Knee/Hip Exercises: Sidelying   Hip ABduction  15 reps;Both      Shoulder Exercises: Supine   Flexion  AROM;15 reps    Other Supine Exercises  hands behind head holding for 5 deep breaths slow inhale and exhale through the nose       Manual Therapy   Soft tissue mobilization  DTM to L glute and hip  PT Short Term Goals - 08/03/19 0954      PT SHORT TERM GOAL #1   Title  Pt to be independent with initial HEP    Time  2    Period  Weeks    Status  New    Target Date  08/16/19      PT SHORT TERM GOAL #2   Title  Pt to demo decreased pain in L hip to 0-4/10 with activiity    Time  3    Period  Weeks    Status  New    Target Date  08/23/19        PT Long Term Goals - 08/03/19 0957      PT LONG TERM GOAL #1   Title  Pt to be independent with final HEp    Time  6    Period  Weeks    Status  New    Target Date  09/13/19      PT LONG TERM GOAL #2   Title  Pt to report decreased pain in L hip to 0-2/10 with activity and walking    Time  6    Period  Weeks    Status  New    Target Date  09/13/19      PT LONG TERM GOAL #3   Title  Pt to demo decreased pain in R shoulder/scapular region to 0-2/10 with reaching, and IADLS.    Time  6    Period  Weeks    Status  New    Target Date  09/13/19      PT LONG TERM GOAL #4   Title  Pt to demo ability for full shoulder AROM bilaterally, without pain,to improve ability for ADLS    Time  6    Period  Weeks    Status  New    Target Date  09/13/19      PT LONG TERM GOAL #5   Title  Pt to demo improved strength of bil shoulders and scapular muscles to at  least 4/5 to improve ability for reaching, lifting, and IADLS.    Time  6    Period  Weeks    Status  New    Target Date  09/13/19            Plan - 08/23/19 1118    Clinical Impression Statement  Pt with soreness in L glute with DTM today. Ther ex continued for posture, UE ROM, and hip strengthening. Pt with deficits of stiffness and poor posture , some likely stemming form parkinsons, and will benefit from continued care for this as well as hip and shoulder pian. Pt with several questions about parkinsons. Recommended she follow up with neurologist for discussion about medication for night time, to improve sleeping.    Rehab Potential  Good    PT Frequency  2x / week    PT Duration  6 weeks    PT Treatment/Interventions  ADLs/Self Care Home Management;Cryotherapy;Electrical Stimulation;Gait training;DME Instruction;Contrast Bath;Ultrasound;Traction;Moist Heat;Iontophoresis 4mg /ml Dexamethasone;Stair training;Functional mobility training;Therapeutic activities;Therapeutic exercise;Balance training;Neuromuscular re-education;Manual techniques;Orthotic Fit/Training;Patient/family education;Passive range of motion;Dry needling;Taping;Spinal Manipulations;Joint Manipulations    PT Home Exercise Plan  [id number]       Patient will benefit from skilled therapeutic intervention in order to improve the following deficits and impairments:     Visit Diagnosis: Acute pain of right shoulder  Muscle weakness (generalized)  Pain in left hip     Problem List Patient Active Problem List  Diagnosis Date Noted  . Pulmonary nodule 06/20/2019  . Statin intolerance 10/13/2018  . Pelvic floor dysfunction 08/26/2018  . Fecal smearing 08/26/2018  . Fecal urgency 08/26/2018  . RLS (restless legs syndrome) 07/14/2018  . 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 Female bladder prolapse 04/07/2018  . 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 Perennial and seasonal  allergic rhinitis 02/01/2018  . Anxiety 09/29/2017  . Parkinson's disease ([place]) 09/29/2017  . Dysfunctional voiding of urine 09/13/2016  . Female stress incontinence 09/13/2016  . 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 Gastroesophageal reflux disease 09/13/2016  . Age-related osteoporosis without current pathological fracture 07/20/2016  . Drug-induced polyneuropathy ([place]) 03/07/2015  . Hyperlipidemia 03/07/2015  . Impaired fasting glucose 03/07/2015  . Atrophic vaginitis 02/13/2015  . History of colon cancer 02/13/2015  . Hypothyroidism due to Hashimoto's thyroiditis 02/13/2015  . Irritable bowel syndrome with both constipation and diarrhea 02/13/2015  . Malignant neoplasm of female breast ([place]) 02/13/2015  . Recurrent UTI 02/13/2015  . Vitamin D deficiency disease 02/13/2015    Lyndee Hensen, PT, DPT 11:22 AM  08/23/19    [facility] [place], [name], [address] Phone: [phone]   Fax:  [phone]  Name: Julia [name] MRN: [id number] Date of Birth: [birth year]

## 2019-08-24 ENCOUNTER — Encounter: Payer: Medicare HMO | Admitting: Physical Therapy

## 2019-08-29 ENCOUNTER — Ambulatory Visit (INDEPENDENT_AMBULATORY_CARE_PROVIDER_SITE_OTHER): Payer: Medicare HMO

## 2019-08-29 ENCOUNTER — Telehealth: Payer: Self-pay | Admitting: Family Medicine

## 2019-08-29 ENCOUNTER — Other Ambulatory Visit: Payer: Self-pay

## 2019-08-29 ENCOUNTER — Encounter: Payer: Medicare HMO | Admitting: Physical Therapy

## 2019-08-29 DIAGNOSIS — J309 Allergic rhinitis, unspecified: Secondary | ICD-10-CM | POA: Diagnosis not present

## 2019-08-29 NOTE — Telephone Encounter (Signed)
Patient called stating that she has taken a double dose of her levothyroxine.  I have sent patient over to triage with [facility].

## 2019-08-29 NOTE — Telephone Encounter (Signed)
Patient notified

## 2019-08-29 NOTE — Telephone Encounter (Signed)
FYI

## 2019-08-29 NOTE — Telephone Encounter (Signed)
This should be fine.  Can tell her not to worry and can continue regular dose.  Does she need help with her medications?

## 2019-09-11 ENCOUNTER — Telehealth: Payer: Self-pay | Admitting: Family Medicine

## 2019-09-11 NOTE — Telephone Encounter (Signed)
Called patient and she states she went to [facility] for an antibiotic  Nurse Assessment Nurse: Fredderick Phenix, RN, Lelan Pons Date/Time Eilene Ghazi Time): 09/10/2019 1:41:56 PM Confirm and document reason for call. If symptomatic, describe symptoms. ---Caller states she is having burning with urination, frequency. Denies fever. Started this morning. Frequent UTIs. Has the patient had close contact with a person known or suspected to have the novel coronavirus illness OR traveled / lives in area with major community spread (including international travel) in the last 14 days from the onset of symptoms? * If Asymptomatic, screen for exposure and travel within the last 14 days. ---No Does the patient have any new or worsening symptoms? ---Yes Will a triage be completed? ---Yes Related visit to physician within the last 2 weeks? ---No Does the PT have any chronic conditions? (i.e. diabetes, asthma, this includes High risk factors for pregnancy, etc.) ---Yes List chronic conditions. ---parkinson's disease Is this a behavioral health or substance abuse call? ---No Guidelines Guideline Title Affirmed Question Affirmed Notes Nurse Date/Time (Eastern Time) Urinary Symptoms Urinating more frequently than usual (i.e., frequency) Fredderick Phenix, RN, Lelan Pons 09/10/2019 1:44:30 PM Disp. Time Eilene Ghazi Time) Disposition Final User 09/10/2019 1:47:51 PM See PCP within 24 Hours Yes Fredderick Phenix, RN, MariePLEASE NOTE: All timestamps contained within this report are represented as [place] Standard Time. CONFIDENTIALTY NOTICE: This fax transmission is intended only for the addressee. It contains information that is legally privileged, confidential or otherwise protected from use or disclosure. If you are not the intended recipient, you are strictly prohibited from reviewing, disclosing, copying using or disseminating any of this information or taking any action in reliance on or regarding this information. If you have received this fax in error,  please notify us immediately by telephone so that we can arrange for its return to [place]. Phone: [phone], Toll-Free: [phone], Fax: [phone] Page: 2 of 2 Call Id: [id number] [place] Disagree/Comply Comply Caller Understands Yes PreDisposition Did not know what to do Care Advice Given Per Guideline SEE PCP WITHIN 24 HOURS: * IF OFFICE WILL BE CLOSED: You need to be seen within the next 24 hours. A clinic or an urgent care center is often a good source of care if your doctor's office is closed or you can't get an appointment. CALL BACK IF: * Fever occurs * You become worse. CARE ADVICE given per Urinary Symptoms (Adult) guideline. * Unable to urinate and bladder feels ful

## 2019-09-11 NOTE — Telephone Encounter (Signed)
Patient seen yesterday at [facility]

## 2019-09-12 ENCOUNTER — Other Ambulatory Visit: Payer: Self-pay

## 2019-09-12 ENCOUNTER — Ambulatory Visit (INDEPENDENT_AMBULATORY_CARE_PROVIDER_SITE_OTHER): Payer: Medicare HMO

## 2019-09-12 DIAGNOSIS — J309 Allergic rhinitis, unspecified: Secondary | ICD-10-CM

## 2019-09-13 ENCOUNTER — Ambulatory Visit: Payer: Medicare HMO | Admitting: Family Medicine

## 2019-09-13 ENCOUNTER — Encounter: Payer: Self-pay | Admitting: Family Medicine

## 2019-09-13 VITALS — BP 120/76 | HR 88 | Ht 64.0 in | Wt 136.4 lb

## 2019-09-13 DIAGNOSIS — G8929 Other chronic pain: Secondary | ICD-10-CM

## 2019-09-13 DIAGNOSIS — M545 Low back pain, unspecified: Secondary | ICD-10-CM

## 2019-09-13 DIAGNOSIS — M7062 Trochanteric bursitis, left hip: Secondary | ICD-10-CM | POA: Diagnosis not present

## 2019-09-13 DIAGNOSIS — M7061 Trochanteric bursitis, right hip: Secondary | ICD-10-CM | POA: Diagnosis not present

## 2019-09-13 NOTE — Patient Instructions (Signed)
Thank you for coming in today. We will extend PT.  Try voltaren gel over the counter on the hip and back area.  Recheck with me in 4-6 weeks.  If not better we can work our way though injection or other options.   Keep me updated in the interim.  Let me know what verbrae you had the fracture.

## 2019-09-13 NOTE — Progress Notes (Signed)
I, Julia [name], LAT, ATC, am serving as scribe for [name].  Julia [name] is a 79 y.o. female who presents to [place] [facility] today for f/u of R shoulder and scapular pain and leg cramping/twitching.  She was last seen by [name] on 07/20/19 and was c/o popping/clicking at the inferior, medial border of her scapula.  She was referred to outpatient PT and has completed 6 visits and was prescribed Baclofen for her leg cramping.  Since her last visit, pt reports that her R shoulder is improving.  She states that her leg cramping/twitching con't.  She is now c/o B hip pain, L>R x several months.  She is having pain w/ bed mobility.    Pain located posterior low back/hip area bilaterally as well as lateral hip area bilaterally.  Pain worse with laying and rolling over in bed and standing from a seated position.  She is able to walk without a lot of pain.   Pertinent review of systems: No fevers or chills  Relevant historical information: Parkinson's disease, history of breast cancer and colon cancer   Exam:  BP 120/76 (BP Location: Right Arm, Patient Position: Sitting, Cuff Size: Normal)   Pulse 88   Ht 5\' 4"  (1.626 m)   Wt 136 lb 6.4 oz (61.9 kg)   SpO2 96%   BMI 23.41 kg/m  General: Well Developed, well nourished, and in no acute distress.   MSK:  L-spine slight curvature otherwise normal-appearing nontender midline.  Not particular tender palpation paraspinal musculature or SI joints. Normal lumbar motion. Hips bilaterally normal-appearing normal motion. Left hip tender palpation greater trochanter.  Hip abductor strength diminished 4/5.  External rotation strength diminished 4/5. Right hip tender palpation greater trochanter.  Hip abduction strength intact 5/5.  External rotation strength diminished 4/5. Positive FADIR test bilaterally.    Lab and Radiology Results  EXAM: MRI OF THE LEFT HIP WITHOUT AND WITH  CONTRAST  TECHNIQUE: Multiplanar, multisequence MR imaging was performed both before and after administration of intravenous contrast.  CONTRAST:  6 mL GADAVIST IV  COMPARISON:  Plain films of the left hip 03/13/2019.  FINDINGS: Bones: There is marrow edema in the L3 and L4 vertebral bodies which is more extensive in L3 and eccentric anteriorly. Mild degenerative endplate signal change is present at L4-5.  Bone marrow signal throughout the pelvis, hips and visualized femurs is normal. Mild sclerosis in the medial aspect of the left femoral neck is most consistent with buttressing secondary to degenerative change. There is no subchondral cyst formation or edema about the hips. No fracture, stress change or avascular necrosis.  Articular cartilage and labrum  Articular cartilage:  Thinned with associated joint space narrowing.  Labrum:  Intact.  Joint or bursal effusion  Joint effusion:  None.  Bursae: Negative.  Muscles and tendons  Muscles and tendons:  Intact.  Other findings  Miscellaneous: Imaged intrapelvic contents demonstrate postoperative change of hysterectomy. No acute abnormality.  IMPRESSION: Abnormal signal in the endplates at R6-0 is worse anteriorly in the L3 vertebral body. Finding could be due to degenerative disc change but the appearance is worrisome for metastatic disease. Whole-body bone scan could be used for further evaluation. Additionally, MRI of the lumbar spine with and without contrast could be used for further evaluation.  Negative for metastatic disease in the pelvis, hips or visualized femurs. 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  Electronically Signed   By: Inge Rise M.D.   On: 03/28/2019 16:23  EXAM: MRI LUMBAR SPINE WITHOUT AND WITH CONTRAST  TECHNIQUE: Multiplanar and multiecho pulse sequences of the  lumbar spine were obtained without and with intravenous contrast.  CONTRAST:  6 mL GADAVIST IV SOLN  COMPARISON:  Left hip MRI 03/28/2019.  FINDINGS: Segmentation:  Standard.  Alignment:  Mild convex right scoliosis.  Vertebrae: No fracture, evidence of discitis, or bone lesion. Signal abnormality at the L3-4 level is due to degenerative disease. There is no evidence of metastatic disease. Mild degenerative endplate signal change is also seen at L4-5.  Conus medullaris and cauda equina: Conus extends to the L1-2 level. Conus and cauda equina appear normal.  Paraspinal and other soft tissues: Left renal cyst noted. Otherwise negative.  Disc levels:  T11-12 is imaged in the sagittal plane only and negative.  T12-L1: Negative.  L1-2: Negative.  L2-3: Negative.  L3-4: Shallow disc bulge and mild ligamentum flavum thickening. No stenosis.  L4-5: Loss of disc space height with a very shallow bulge and endplate spur. No stenosis.  L5-S1: Mild facet arthropathy. Shallow right paracentral protrusion. No stenosis.  IMPRESSION: Negative for metastatic disease. Signal abnormality in L3-4 seen on prior MRI is due to degenerative disease.  Lower lumbar degenerative disease without central canal or foraminal narrowing.   Electronically Signed   By: Inge Rise M.D.   On: 04/13/2019 10:40  I, Lynne Leader, personally (independently) visualized and performed the interpretation of the images attached in this note.   Assessment and Plan: 79 y.o. female with low back pain and lateral hip pain bilaterally.  Multifactorial.  Patient does have degenerative changes on L-spine on MRI in January 2021.  This could certainly be contributing to her back pain.  Additionally she has evidence of hip abductor tendinopathy/trochanteric bursitis as well.  Plan to broaden physical therapy to include both issues.    We will recheck in about a month.  If not better  would consider targeted injections to either the trochanteric bursa or possibly SI joints.  We will consider facet injections as well in the future if needed.   PDMP not reviewed this encounter. Orders Placed This Encounter  Procedures  . Ambulatory referral to Physical Therapy    Referral Priority:   Routine    Referral Type:   Physical Medicine    Referral Reason:   Specialty Services Required    Requested Specialty:   Physical Therapy   No orders of the defined types were placed in this encounter.    Discussed warning signs or symptoms. Please see discharge instructions. Patient expresses understanding.   The above documentation has been reviewed and is accurate and complete Lynne Leader, M.D.

## 2019-09-14 ENCOUNTER — Encounter: Payer: Self-pay | Admitting: Physical Therapy

## 2019-09-14 ENCOUNTER — Other Ambulatory Visit: Payer: Self-pay

## 2019-09-14 ENCOUNTER — Ambulatory Visit (INDEPENDENT_AMBULATORY_CARE_PROVIDER_SITE_OTHER): Payer: Medicare HMO | Admitting: Physical Therapy

## 2019-09-14 DIAGNOSIS — M25552 Pain in left hip: Secondary | ICD-10-CM

## 2019-09-14 DIAGNOSIS — M6281 Muscle weakness (generalized): Secondary | ICD-10-CM

## 2019-09-14 DIAGNOSIS — M25511 Pain in right shoulder: Secondary | ICD-10-CM

## 2019-09-18 ENCOUNTER — Ambulatory Visit (INDEPENDENT_AMBULATORY_CARE_PROVIDER_SITE_OTHER): Payer: Medicare HMO | Admitting: Physical Therapy

## 2019-09-18 ENCOUNTER — Other Ambulatory Visit: Payer: Self-pay

## 2019-09-18 ENCOUNTER — Encounter: Payer: Self-pay | Admitting: Physical Therapy

## 2019-09-18 ENCOUNTER — Ambulatory Visit (INDEPENDENT_AMBULATORY_CARE_PROVIDER_SITE_OTHER): Payer: Medicare HMO | Admitting: Family Medicine

## 2019-09-18 ENCOUNTER — Encounter: Payer: Self-pay | Admitting: Family Medicine

## 2019-09-18 VITALS — BP 128/74 | HR 88 | Temp 98.3°F | Resp 14 | Ht 64.0 in | Wt 136.2 lb

## 2019-09-18 DIAGNOSIS — M25511 Pain in right shoulder: Secondary | ICD-10-CM | POA: Diagnosis not present

## 2019-09-18 DIAGNOSIS — M25552 Pain in left hip: Secondary | ICD-10-CM

## 2019-09-18 DIAGNOSIS — M6281 Muscle weakness (generalized): Secondary | ICD-10-CM | POA: Diagnosis not present

## 2019-09-18 DIAGNOSIS — R21 Rash and other nonspecific skin eruption: Secondary | ICD-10-CM

## 2019-09-18 DIAGNOSIS — N3000 Acute cystitis without hematuria: Secondary | ICD-10-CM

## 2019-09-18 NOTE — Therapy (Signed)
[facility] [address] [place], [place], [address] Phone: [phone]   Fax:  [phone]  Physical Therapy Treatment  Patient Details  Name: Julia [name] MRN: [id number] Date of Birth: 10/24/1940 Referring Provider (PT): Evan corey   Encounter Date: 09/18/2019   PT End of Session - 09/18/19 1025    Visit Number 8    Number of Visits 20    Date for PT Re-Evaluation 10/26/19    Authorization Type [employer]    PT Start Time 1017    PT Stop Time 1100    PT Time Calculation (min) 43 min    Activity Tolerance Patient tolerated treatment well    Behavior During Therapy WFL for tasks assessed/performed           Past Medical History:  Diagnosis Date  . Arthritis   . Breast cancer (HCC)   . Colon cancer (HCC)    previous colon cancer  . Hyperlipidemia   . 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 RLS (restless legs syndrome) 07/14/2018  . 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HCC)     Past Surgical History:  Procedure Laterality Date  . ADENOIDECTOMY    . BREAST LUMPECTOMY Left    radiation only  . BUNIONECTOMY    . COLON SURGERY    . HEMORRHOID SURGERY    . LAPAROSCOPY    . NASAL SINUS SURGERY    . OOPHORECTOMY    . TONSILLECTOMY    . TUBAL LIGATION      There were no vitals filed for this visit.   Subjective Assessment - 09/18/19 1021    Subjective Pt states improved sleep last night, did not wake up with pain. She has been doing HEP. She has small red skin rash on R upper thigh today, thinks shes having allergic reaction to anti-biotic she is on for UTI.    Currently in Pain? Yes    Pain Score 2     Pain Location Hip    Pain Orientation Right;Left    Pain Descriptors / Indicators Tightness    Pain Type Acute pain    Pain Onset More than a month ago    Pain Frequency Intermittent                             [facility] Adult PT Treatment/Exercise - 09/18/19 1026      Knee/Hip  Exercises: Stretches   Piriformis Stretch 3 reps;30 seconds    Piriformis Stretch Limitations supine mod fig 4    Other Knee/Hip Stretches --      Knee/Hip Exercises: Aerobic   Recumbent Bike L 1 x 8 min;       Knee/Hip Exercises: Standing   Hip Flexion 20 reps;Knee bent;Both    Hip Abduction 10 reps;2 sets    Other Standing Knee Exercises Mini squats x 20, counter support       Knee/Hip Exercises: Supine   Bridges 20 reps    Straight Leg Raises --    Other Supine Knee/Hip Exercises --      Knee/Hip Exercises: Sidelying   Hip ABduction 15 reps;Both      Shoulder Exercises: Supine   Other Supine Exercises --      Shoulder Exercises: Standing   Flexion 15 reps;Both;AROM    Row 20 reps    Theraband Level (Shoulder Row) Level 2 (Red)      Manual Therapy   Manual Therapy Joint   mobilization    Joint Mobilization hip inf mobs gr 3  bil;     Soft tissue mobilization STM/roller to bil hip/glute, ITB                    PT Short Term Goals - 09/18/19 0911      PT SHORT TERM GOAL #1   Title Pt to be independent with initial HEP    Time 2    Period Weeks    Status Achieved    Target Date 08/16/19      PT SHORT TERM GOAL #2   Title Pt to demo decreased pain in L hip to 0-4/10 with activiity    Time 3    Period Weeks    Status Partially Met    Target Date 08/23/19             PT Long Term Goals - 09/18/19 0911      PT LONG TERM GOAL #1   Title Pt to be independent with final HEp    Time 6    Period Weeks    Status On-going    Target Date 10/26/19      PT LONG TERM GOAL #2   Title Pt to report decreased pain in L hip to 0-2/10 with activity and sleeping    Time 6    Period Weeks    Status On-going    Target Date 10/26/19      PT LONG TERM GOAL #3   Title Pt to demo decreased pain in R shoulder/scapular region to 0-2/10 with reaching, and IADLS.    Time 6    Period Weeks    Status Partially Met    Target Date 10/26/19      PT LONG TERM GOAL #4    Title Pt to demo ability for full shoulder AROM bilaterally, without pain,to improve ability for ADLS    Time 6    Period Weeks    Status Partially Met    Target Date 10/26/19      PT LONG TERM GOAL #5   Title Pt to demo improved strength of bil shoulders and scapular muscles to at least 4/5 to improve ability for reaching, lifting, and IADLS.    Time 6    Period Weeks    Status New    Target Date 10/26/19                 Plan - 09/18/19 1226    Clinical Impression Statement Pt with soreness in bil hips today with palpation and STM. L slightly less tender since last visit with dry needling. Pt req mod-max cuing for lumbar posture with ther ex. Pt seeing MD today for rash on LEs. Plan to progress as tolerated.    Rehab Potential Good    PT Frequency 2x / week    PT Duration 6 weeks    PT Treatment/Interventions ADLs/Self Care Home Management;Cryotherapy;Electrical Stimulation;Gait training;DME Instruction;Contrast Bath;Ultrasound;Traction;Moist Heat;Iontophoresis 4mg/ml Dexamethasone;Stair training;Functional mobility training;Therapeutic activities;Therapeutic exercise;Balance training;Neuromuscular re-education;Manual techniques;Orthotic Fit/Training;Patient/family education;Passive range of motion;Dry needling;Taping;Spinal Manipulations;Joint Manipulations    PT Home Exercise Plan [id number]    Consulted and Agree with Plan of Care Patient           Patient will benefit from skilled therapeutic intervention in order to improve the following deficits and impairments:     Visit Diagnosis: Pain in left hip  Acute pain of right shoulder  Muscle weakness (generalized)     Problem List Patient   Active Problem List   Diagnosis Date Noted  . Pulmonary nodule 06/20/2019  . Statin intolerance 10/13/2018  . Pelvic floor dysfunction 08/26/2018  . Fecal smearing 08/26/2018  . Fecal urgency 08/26/2018  . RLS (restless legs syndrome) 07/14/2018  . REM sleep behavior  disorder 07/14/2018  . Female bladder prolapse 04/07/2018  . 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 Perennial and seasonal allergic rhinitis 02/01/2018  . Anxiety 09/29/2017  . Parkinson's disease (HCC) 09/29/2017  . Dysfunctional voiding of urine 09/13/2016  . Female stress incontinence 09/13/2016  . 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 Gastroesophageal reflux disease 09/13/2016  . Age-related osteoporosis without current pathological fracture 07/20/2016  . 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 Hyperlipidemia 03/07/2015  . Impaired fasting glucose 03/07/2015  . Atrophic vaginitis 02/13/2015  . History of colon cancer 02/13/2015  . Hypothyroidism due to Hashimoto's thyroiditis 02/13/2015  . Irritable bowel syndrome with both constipation and diarrhea 02/13/2015  . Malignant neoplasm of female breast (HCC) 02/13/2015  . Recurrent UTI 02/13/2015  . Vitamin D deficiency disease 02/13/2015    Lauren Carroll, PT, DPT 12:37 PM  09/18/19    [facility] [address] [place], , [address] Phone: [phone]   Fax:  [phone]  Name: Tashima Milling MRN: [id number] Date of Birth: 01/12/1941   

## 2019-09-18 NOTE — Patient Instructions (Signed)
Please follow up as scheduled for your next visit with me: 10/17/2019 for physical.  Stop the nitrofurantoin.  Let me know if the rash worsens. It should resolve over the next week or so.   If you have any questions or concerns, please don't hesitate to send me a message via MyChart or call the office at [phone]. Thank you for visiting with Julia [name] today! It's our pleasure caring for you.

## 2019-09-18 NOTE — Progress Notes (Signed)
Subjective  CC:  Chief Complaint  Patient presents with  . Rash    x 3 days right arm and b/l legs     HPI: Julia [name] is a 79 y.o. female who presents to the office today to address the problems listed above in the chief complaint.  06/13 evaluated at [facility] - I reviewed notes and results: + e.coli UTI treated with macrobid (pansensitive). Doing better but broke out with red itching "whelp" on right thigh 2-3 days after starting medication. Since, rash has coalesced and is improving. No longer itching. Has smaller version of same on inner left thigh. No other rash or spread or malaise. No further urinary sxs. macrobid is on allergy list due to diarrhea; pt has loose frequent stools since starting it.   Assessment  1. Rash and nonspecific skin eruption   2. Acute cystitis without hematuria      Plan   rash:  ? Bite/sting or allergic reaction. Stop abx. Monitor. No treatment warranted at this point. May use topical benadryl if needed.  UTI: treated. Recheck urine culture for test of cure given h/o recurrent infections. And pt only took once a day instead of bid.   Follow up:   cpe in July as scheduled.  Orders Placed This Encounter  Procedures  . Urine Culture   No orders of the defined types were placed in this encounter.     I reviewed the patients updated PMH, FH, and SocHx.    Patient Active Problem List   Diagnosis Date Noted  . Statin intolerance 10/13/2018    Priority: High  . Age-related osteoporosis without current pathological fracture 07/20/2016    Priority: High  . Hyperlipidemia 03/07/2015    Priority: High  . Impaired fasting glucose 03/07/2015    Priority: High  . History of colon cancer 02/13/2015    Priority: High  . Hypothyroidism due to Hashimoto's thyroiditis 02/13/2015    Priority: High  . Malignant neoplasm of female breast ([place]) 02/13/2015    Priority: High  . RLS (restless legs syndrome) 07/14/2018    Priority: Medium  . REM sleep behavior  disorder 07/14/2018    Priority: Medium  . Anxiety 09/29/2017    Priority: Medium  . Parkinson's disease ([place]) 09/29/2017    Priority: Medium  . Dysfunctional voiding of urine 09/13/2016    Priority: Medium  . Female stress incontinence 09/13/2016    Priority: Medium  . 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[place]) 09/13/2016    Priority: Medium  . Gastroesophageal reflux disease 09/13/2016    Priority: Medium  . Drug-induced polyneuropathy ([place]) 03/07/2015    Priority: Medium  . Irritable bowel syndrome with both constipation and diarrhea 02/13/2015    Priority: Medium  . Perennial and seasonal allergic rhinitis 02/01/2018    Priority: Low  . Atrophic vaginitis 02/13/2015    Priority: Low  . Recurrent UTI 02/13/2015    Priority: Low  . Vitamin D deficiency disease 02/13/2015    Priority: Low  . Pulmonary nodule 06/20/2019  . Pelvic floor dysfunction 08/26/2018  . Fecal smearing 08/26/2018  . Fecal urgency 08/26/2018  . Female bladder prolapse 04/07/2018  . 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 Baclofen 5 MG TABS Take 5-10 mg by mouth at bedtime as needed (leg cramping or jerking).  . Calcium Citrate-Vitamin D 315-250 MG-UNIT TABS Take by mouth.  . Carbidopa-Levodopa ER (SINEMET CR) 25-100 MG tablet controlled release TAKE ONE TABLET BY  MOUTH FOUR TIMES A DAY  . denosumab (PROLIA) 60 MG/ML SOSY injection Inject 60 mg into the skin every 6 (six) months.  . EPINEPHRINE 0.3 mg/0.3 mL IJ SOAJ injection INJECT INTO THE MIDDLE OF THE OUTER THIGH AND HOLD FOR 3 SECONDS AS NEEDED FOR SEVERE ALLERGIC REACTION THEN CALL 911 IF USED.  Marland Kitchen Fexofenadine HCl (ALLEGRA ALLERGY PO) Take by mouth.  . fluticasone (FLONASE) 50 MCG/ACT nasal spray Place 2 sprays into both nostrils daily as needed for allergies or rhinitis.  Marland Kitchen HYDROcodone-acetaminophen (NORCO/VICODIN) 5-325 MG tablet Take 1 tablet by mouth every 6 (six) hours as needed.  Marland Kitchen levothyroxine  (SYNTHROID) 88 MCG tablet TAKE ONE TABLET BY MOUTH DAILY BEFORE BREAKFAST  . Multiple Vitamins-Minerals (CENTRUM SILVER PO) Take by mouth.  . NON FORMULARY [facility]  Creams-#11 Peripheral Neuropathy cream  . Probiotic Product (PROBIOTIC DAILY PO) Take by mouth.  . tamoxifen (NOLVADEX) 20 MG tablet Take 1 tablet (20 mg total) by mouth daily.  . traMADol (ULTRAM) 50 MG tablet Take 1 tablet (50 mg total) by mouth every 8 (eight) hours as needed for severe pain.  Marland Kitchen triamcinolone cream (KENALOG) 0.1 % Apply 1 application topically 2 (two) times daily.  Marland Kitchen UNABLE TO FIND Med Name: Allergy shots 1/week    Allergies: Patient is allergic to cefdinir, cholestyramine, contrast media [iodinated diagnostic agents], ezetimibe, meperidine, nitrofurantoin, statins, and teriparatide. Family History: Patient family history includes Arthritis in her father, mother, and sister; Cancer in her brother, father, sister, sister, and sister; Depression in her father; Diabetes in her mother and sister; Heart disease in her mother; Hyperlipidemia in her brother and sister; Hypertension in her mother; Osteoporosis in her father. Social History:  Patient  reports that she has never smoked. She has never used smokeless tobacco. She reports current alcohol use. She reports that she does not use drugs.  Review of Systems: Constitutional: Negative for fever malaise or anorexia Cardiovascular: negative for chest pain Respiratory: negative for SOB or persistent cough Gastrointestinal: negative for abdominal pain  Objective  Vitals: BP 128/74   Pulse 88   Temp 98.3 F (36.8 C) (Temporal)   Resp 14   Ht 5\' 4"  (1.626 m)   Wt 136 lb 3.2 oz (61.8 kg)   SpO2 95%   BMI 23.38 kg/m  General: no acute distress , A&Ox3 Skin:  Warm, right ant thigh with 4-5cm macular blanchable nontender rash w/o flaking. No warmth. Has quarter sized macular rash on left inner thigh as well. No hives, vesicles.      Commons side  effects, risks, benefits, and alternatives for medications and treatment plan prescribed today were discussed, and the patient expressed understanding of the given instructions. Patient is instructed to call or message via MyChart if he/she has any questions or concerns regarding our treatment plan. No barriers to understanding were identified. We discussed Red Flag symptoms and signs in detail. Patient expressed understanding regarding what to do in case of urgent or emergency type symptoms.   Medication list was reconciled, printed and provided to the patient in AVS. Patient instructions and summary information was reviewed with the patient as documented in the AVS. This note was prepared with assistance of Dragon voice recognition software. Occasional wrong-word or sound-a-like substitutions may have occurred due to the inherent limitations of voice recognition software  This visit occurred during the SARS-CoV-2 public health emergency.  Safety protocols were in place, including screening questions prior to the visit, additional usage of staff PPE, and extensive cleaning  of exam room while observing appropriate contact time as indicated for disinfecting solutions.

## 2019-09-18 NOTE — Therapy (Addendum)
[facility] [address] [place], [name], [address] Phone: [phone]   Fax:  [phone]  Physical Therapy Treatment/Re-Cert/ Progress note  Physical Therapy Progress Note  Dates of Reporting Period: 08/02/19 to 09/14/19    Patient Details  Name: Julia [name] MRN: [id number] Date of Birth: [birth year] Referring Provider (PT): Lynne Leader   Encounter Date: 09/14/2019   PT End of Session - 09/18/19 0910    Visit Number 7    Number of Visits 20    Date for PT Re-Evaluation 10/26/19    Authorization Type [employer]    PT Start Time 0511    PT Stop Time 1100    PT Time Calculation (min) 45 min    Activity Tolerance Patient tolerated treatment well    Behavior During Therapy [facility] for tasks assessed/performed           Past Medical History:  Diagnosis Date  . Arthritis   . Breast cancer ([place])   . Colon cancer ([place])    previous colon cancer  . Hyperlipidemia   . 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 RLS (restless legs syndrome) 07/14/2018  . Secondary malignant neoplasm of unspecified ovary [facility])     Past Surgical History:  Procedure Laterality Date  . ADENOIDECTOMY    . BREAST LUMPECTOMY Left    radiation only  . BUNIONECTOMY    . COLON SURGERY    . HEMORRHOID SURGERY    . LAPAROSCOPY    . NASAL SINUS SURGERY    . OOPHORECTOMY    . TONSILLECTOMY    . TUBAL LIGATION      There were no vitals filed for this visit.   Subjective Assessment - 09/18/19 0908    Subjective Pt was out of town on vacation. Had follow up appt with MD. Pt states continued pain in L hip, bothersome and has difficulty sleeping at night. Shoulder is a bit better, still sore with cleaning and IADLs. Clicking in shoudler blades improved. Had fall last week, thinks it was because she had a margarita, no injuries.    Patient Stated Goals decreased pain in hips    Currently in Pain? Yes    Pain Score 4     Pain Location Hip    Pain Orientation  Left    Pain Descriptors / Indicators Aching    Pain Type Acute pain    Pain Onset More than a month ago    Pain Frequency Intermittent    Aggravating Factors  sleeping, sitting.    Pain Relieving Factors movement    Pain Score 2    Pain Location Shoulder    Pain Orientation Right;Left    Pain Descriptors / Indicators Aching    Pain Type Chronic pain    Pain Onset More than a month ago    Pain Frequency Intermittent              [facility] PT Assessment - 09/18/19 0001      AROM   Right Shoulder Flexion 150 Degrees    Right Shoulder ABduction 130 Degrees      Strength   Right Shoulder Flexion 4/5    Right Shoulder ABduction 4/5    Right Shoulder Internal Rotation 4/5    Right Shoulder External Rotation 4/5    Left Hip Flexion 4/5    Left Hip External Rotation 4/5    Left Hip Internal Rotation 4/5    Left Hip ABduction 4/5      Palpation  Palpation comment Pain, tenderness in L hip, gr troch into ITB and glute       Special Tests   Other special tests mild crepitus with AROM for bil shoulders, much decreased since Eval. Poor shoulder mechanics for reaching fwd/elevation, wiht increased protraction                         [facility] Adult PT Treatment/Exercise - 09/18/19 0001      Knee/Hip Exercises: Stretches   Other Knee/Hip Stretches SKTC 30 sec x 2 bil;       Knee/Hip Exercises: Aerobic   Recumbent Bike L 1 x 8 min;       Knee/Hip Exercises: Standing   Hip Flexion 20 reps;Knee bent;Both    Hip Abduction 10 reps;2 sets      Knee/Hip Exercises: Supine   Bridges 20 reps    Straight Leg Raises 15 reps;Both    Other Supine Knee/Hip Exercises supine march x 20; Clam GTB x 20;       Knee/Hip Exercises: Sidelying   Hip ABduction 15 reps;Both      Shoulder Exercises: Supine   Other Supine Exercises hands behind head holding for 5 deep breaths slow inhale and exhale through the nose       Shoulder Exercises: Standing   Row 20 reps    Theraband Level  (Shoulder Row) Level 2 (Red)      Manual Therapy   Soft tissue mobilization DTM and IASTM to L glute and hip and ITB            * Addendum: Dry needling performed 09/14/19: Pt verbal consent obtained, handout given Muscles:  L glute min, med, piriformis: response: increased tissue length :  * Dry needling info was left out of 6/17 note in error. Added today Lyndee Hensen, PT, DPT 1:15 PM  09/27/19        PT Education - 09/18/19 0910    Education Details Reviewed HEP    Person(s) Educated Patient    Methods Explanation;Tactile cues;Demonstration    Comprehension Verbalized understanding;Returned demonstration;Verbal cues required;Need further instruction            PT Short Term Goals - 09/18/19 0911      PT SHORT TERM GOAL #1   Title Pt to be independent with initial HEP    Time 2    Period Weeks    Status Achieved    Target Date 08/16/19      PT SHORT TERM GOAL #2   Title Pt to demo decreased pain in L hip to 0-4/10 with activiity    Time 3    Period Weeks    Status Partially Met    Target Date 08/23/19             PT Long Term Goals - 09/18/19 0911      PT LONG TERM GOAL #1   Title Pt to be independent with final HEp    Time 6    Period Weeks    Status On-going    Target Date 10/26/19      PT LONG TERM GOAL #2   Title Pt to report decreased pain in L hip to 0-2/10 with activity and sleeping    Time 6    Period Weeks    Status On-going    Target Date 10/26/19      PT LONG TERM GOAL #3   Title Pt to demo decreased pain in R shoulder/scapular region  to 0-2/10 with reaching, and IADLS.    Time 6    Period Weeks    Status Partially Met    Target Date 10/26/19      PT LONG TERM GOAL #4   Title Pt to demo ability for full shoulder AROM bilaterally, without pain,to improve ability for ADLS    Time 6    Period Weeks    Status Partially Met    Target Date 10/26/19      PT LONG TERM GOAL #5   Title Pt to demo improved strength of bil shoulders  and scapular muscles to at least 4/5 to improve ability for reaching, lifting, and IADLS.    Time 6    Period Weeks    Status New    Target Date 10/26/19                 Plan - 09/18/19 0916    Clinical Impression Statement Pt has been seen for 7 visits. She has improved ability and tolerance for ther ex and strengthening during sessoins,as well as improved ability for HEP. She continues to have pain in L hip, that is bothersome with activity and at night time. She has had improved shoulder ROM and decreased crepitus with movment, but continues to have weakness in scapular muscles, shoulders, and poor movement mechanics for reaching and IADLs, with increased protraction of shoulder. Pt will benefit from continuation of skilled PT to improve pain, strength, function, and to meet LTGs.    Rehab Potential Good    PT Frequency 2x / week    PT Duration 6 weeks    PT Treatment/Interventions ADLs/Self Care Home Management;Cryotherapy;Electrical Stimulation;Gait training;DME Instruction;Contrast Bath;Ultrasound;Traction;Moist Heat;Iontophoresis 32m/ml Dexamethasone;Stair training;Functional mobility training;Therapeutic activities;Therapeutic exercise;Balance training;Neuromuscular re-education;Manual techniques;Orthotic Fit/Training;Patient/family education;Passive range of motion;Dry needling;Taping;Spinal Manipulations;Joint Manipulations    PT Home Exercise Plan [email or web address]    Consulted and Agree with Plan of Care Patient           Patient will benefit from skilled therapeutic intervention in order to improve the following deficits and impairments:     Visit Diagnosis: Acute pain of right shoulder  Pain in left hip  Muscle weakness (generalized)     Problem List Patient Active Problem List   Diagnosis Date Noted  . Pulmonary nodule 06/20/2019  . Statin intolerance 10/13/2018  . Pelvic floor dysfunction 08/26/2018  . Fecal smearing 08/26/2018  . Fecal urgency 08/26/2018  . RLS  (restless legs syndrome) 07/14/2018  . 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 Female bladder prolapse 04/07/2018  . 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 Perennial and seasonal allergic rhinitis 02/01/2018  . Anxiety 09/29/2017  . Parkinson's disease ([place] 09/29/2017  . Dysfunctional voiding of urine 09/13/2016  . Female stress incontinence 09/13/2016  . 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 Gastroesophageal reflux disease 09/13/2016  . Age-related osteoporosis without current pathological fracture 07/20/2016  . 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 Hyperlipidemia 03/07/2015  . Impaired fasting glucose 03/07/2015  . Atrophic vaginitis 02/13/2015  . History of colon cancer 02/13/2015  . Hypothyroidism due to Hashimoto's thyroiditis 02/13/2015  . Irritable bowel syndrome with both constipation and diarrhea 02/13/2015  . Malignant neoplasm of female breast ([place] 02/13/2015  . Recurrent UTI 02/13/2015  . Vitamin D deficiency disease 02/13/2015    LLyndee Hensen PT, DPT 9:20 AM  09/18/19    [facility] [address]Phone: [phone]  Fax:  [phone]  Name: Julia [name] MRN: [id number] Date of Birth: 1940/11/14

## 2019-09-18 NOTE — Addendum Note (Signed)
Addended by: Doran Clay A on: 09/18/2019 11:55 AM   Modules accepted: Orders

## 2019-09-19 ENCOUNTER — Other Ambulatory Visit: Payer: Self-pay | Admitting: Family Medicine

## 2019-09-19 ENCOUNTER — Ambulatory Visit (INDEPENDENT_AMBULATORY_CARE_PROVIDER_SITE_OTHER): Payer: Medicare HMO

## 2019-09-19 DIAGNOSIS — J309 Allergic rhinitis, unspecified: Secondary | ICD-10-CM

## 2019-09-20 ENCOUNTER — Ambulatory Visit (INDEPENDENT_AMBULATORY_CARE_PROVIDER_SITE_OTHER): Payer: Medicare HMO | Admitting: Physical Therapy

## 2019-09-20 ENCOUNTER — Encounter: Payer: Self-pay | Admitting: Physical Therapy

## 2019-09-20 ENCOUNTER — Other Ambulatory Visit: Payer: Self-pay

## 2019-09-20 DIAGNOSIS — M25511 Pain in right shoulder: Secondary | ICD-10-CM

## 2019-09-20 DIAGNOSIS — M6281 Muscle weakness (generalized): Secondary | ICD-10-CM

## 2019-09-20 DIAGNOSIS — M25552 Pain in left hip: Secondary | ICD-10-CM

## 2019-09-25 ENCOUNTER — Encounter: Payer: Medicare HMO | Admitting: Physical Therapy

## 2019-09-25 ENCOUNTER — Encounter: Payer: Self-pay | Admitting: Physical Therapy

## 2019-09-25 NOTE — Therapy (Signed)
[facility] [address] [place], [name], [address] Phone: [phone]   Fax:  [phone]  Physical Therapy Treatment  Patient Details  Name: Julia [name] MRN: [id number] Date of Birth: 1940/06/24 Referring Provider (PT): Lynne Leader   Encounter Date: 09/20/2019   PT End of Session - 09/25/19 0822    Visit Number 9    Number of Visits 20    Date for PT Re-Evaluation 10/26/19    Authorization Type A[employer]na Medicare    PT Start Time 1016    PT Stop Time 1100    PT Time Calculation (min) 44 min    Activity Tolerance Patient tolerated treatment well    Behavior During Therapy [facility] for tasks assessed/performed           Past Medical History:  Diagnosis Date  . Arthritis   . Breast cancer ([place])   . Colon cancer ([place])    previous colon cancer  . Hyperlipidemia   . 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 RLS (restless legs syndrome) 07/14/2018  . Secondary malignant neoplasm of unspecified ovary [facility])     Past Surgical History:  Procedure Laterality Date  . ADENOIDECTOMY    . BREAST LUMPECTOMY Left    radiation only  . BUNIONECTOMY    . COLON SURGERY    . HEMORRHOID SURGERY    . LAPAROSCOPY    . NASAL SINUS SURGERY    . OOPHORECTOMY    . TONSILLECTOMY    . TUBAL LIGATION      There were no vitals filed for this visit.   Subjective Assessment - 09/25/19 0821    Subjective Pt states improving pain in L hip. Sore at low back/SI region today. Less cramping in legs last night.    Currently in Pain? Yes    Pain Score 2     Pain Location Hip    Pain Orientation Left    Pain Descriptors / Indicators Aching    Pain Type Acute pain    Pain Onset More than a month ago    Pain Frequency Intermittent                             [facility] Adult PT Treatment/Exercise - 09/25/19 0001      Knee/Hip Exercises: Stretches   Other Knee/Hip Stretches SKTC 30 sec x 2 bil;     Other Knee/Hip Stretches pelvic tilts x 20;        Knee/Hip Exercises: Aerobic   Recumbent Bike L 1 x 8 min;       Knee/Hip Exercises: Standing   Hip Flexion 20 reps;Knee bent;Both    Hip Abduction 10 reps;2 sets      Knee/Hip Exercises: Supine   Bridges 20 reps    Straight Leg Raises 15 reps;Both    Other Supine Knee/Hip Exercises Clam GTB x 20;       Knee/Hip Exercises: Sidelying   Hip ABduction 15 reps;Both      Shoulder Exercises: Standing   Flexion 15 reps;Both;AROM    Row 20 reps    Theraband Level (Shoulder Row) Level 2 (Red)      Shoulder Exercises: Stretch   Other Shoulder Stretches Supine pec stretch 30 sec x 3;       Manual Therapy   Manual Therapy Joint mobilization    Joint Mobilization hip inf mobs gr 3  bil;     Soft tissue mobilization STM to R glute  and hip.                     PT Short Term Goals - 09/18/19 0911      PT SHORT TERM GOAL #1   Title Pt to be independent with initial HEP    Time 2    Period Weeks    Status Achieved    Target Date 08/16/19      PT SHORT TERM GOAL #2   Title Pt to demo decreased pain in L hip to 0-4/10 with activiity    Time 3    Period Weeks    Status Partially Met    Target Date 08/23/19             PT Long Term Goals - 09/18/19 0911      PT LONG TERM GOAL #1   Title Pt to be independent with final HEp    Time 6    Period Weeks    Status On-going    Target Date 10/26/19      PT LONG TERM GOAL #2   Title Pt to report decreased pain in L hip to 0-2/10 with activity and sleeping    Time 6    Period Weeks    Status On-going    Target Date 10/26/19      PT LONG TERM GOAL #3   Title Pt to demo decreased pain in R shoulder/scapular region to 0-2/10 with reaching, and IADLS.    Time 6    Period Weeks    Status Partially Met    Target Date 10/26/19      PT LONG TERM GOAL #4   Title Pt to demo ability for full shoulder AROM bilaterally, without pain,to improve ability for ADLS    Time 6    Period Weeks    Status Partially Met     Target Date 10/26/19      PT LONG TERM GOAL #5   Title Pt to demo improved strength of bil shoulders and scapular muscles to at least 4/5 to improve ability for reaching, lifting, and IADLS.    Time 6    Period Weeks    Status New    Target Date 10/26/19                 Plan - 09/25/19 0826    Clinical Impression Statement Pt with some improvment of hip pain today. Does have soreness into R SI region. Continue to discuss and practice optimal seated posture. Pt with tendency for posterior pelvic tilt in seated position. Pt improving with hip strength and shoulder ROM.    Rehab Potential Good    PT Frequency 2x / week    PT Duration 6 weeks    PT Treatment/Interventions ADLs/Self Care Home Management;Cryotherapy;Electrical Stimulation;Gait training;DME Instruction;Contrast Bath;Ultrasound;Traction;Moist Heat;Iontophoresis 70m/ml Dexamethasone;Stair training;Functional mobility training;Therapeutic activities;Therapeutic exercise;Balance training;Neuromuscular re-education;Manual techniques;Orthotic Fit/Training;Patient/family education;Passive range of motion;Dry needling;Taping;Spinal Manipulations;Joint Manipulations    PT Home Exercise Plan [email or web address]    Consulted and Agree with Plan of Care Patient           Patient will benefit from skilled therapeutic intervention in order to improve the following deficits and impairments:     Visit Diagnosis: Pain in left hip  Acute pain of right shoulder  Muscle weakness (generalized)     Problem List Patient Active Problem List   Diagnosis Date Noted  . Pulmonary nodule 06/20/2019  . Statin intolerance 10/13/2018  . Pelvic floor dysfunction 08/26/2018  .  Fecal smearing 08/26/2018  . Fecal urgency 08/26/2018  . RLS (restless legs syndrome) 07/14/2018  . 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 Female bladder prolapse 04/07/2018  . 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 Perennial and seasonal  allergic rhinitis 02/01/2018  . Anxiety 09/29/2017  . Parkinson's disease ([place]) 09/29/2017  . Dysfunctional voiding of urine 09/13/2016  . Female stress incontinence 09/13/2016  . 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 Gastroesophageal reflux disease 09/13/2016  . Age-related osteoporosis without current pathological fracture 07/20/2016  . 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 Hyperlipidemia 03/07/2015  . Impaired fasting glucose 03/07/2015  . Atrophic vaginitis 02/13/2015  . History of colon cancer 02/13/2015  . Hypothyroidism due to Hashimoto's thyroiditis 02/13/2015  . Irritable bowel syndrome with both constipation and diarrhea 02/13/2015  . Malignant neoplasm of female breast ([place]) 02/13/2015  . Recurrent UTI 02/13/2015  . Vitamin D deficiency disease 02/13/2015   Lyndee Hensen, PT, DPT 8:27 AM  09/25/19   [facility] [place], [name], [address] Phone: [phone]   Fax:  [phone]  Name: Alazia Crocket MRN: [id number] Date of Birth: [birth year]

## 2019-09-26 ENCOUNTER — Encounter: Payer: Self-pay | Admitting: Allergy

## 2019-09-26 ENCOUNTER — Ambulatory Visit (INDEPENDENT_AMBULATORY_CARE_PROVIDER_SITE_OTHER): Payer: Medicare HMO | Admitting: Allergy

## 2019-09-26 ENCOUNTER — Ambulatory Visit: Payer: Medicare HMO

## 2019-09-26 ENCOUNTER — Other Ambulatory Visit: Payer: Self-pay

## 2019-09-26 VITALS — BP 116/70 | HR 86 | Resp 16

## 2019-09-26 DIAGNOSIS — J3089 Other allergic rhinitis: Secondary | ICD-10-CM

## 2019-09-26 DIAGNOSIS — J309 Allergic rhinitis, unspecified: Secondary | ICD-10-CM

## 2019-09-26 DIAGNOSIS — Z8709 Personal history of other diseases of the respiratory system: Secondary | ICD-10-CM | POA: Diagnosis not present

## 2019-09-26 DIAGNOSIS — R0600 Dyspnea, unspecified: Secondary | ICD-10-CM

## 2019-09-26 DIAGNOSIS — R0609 Other forms of dyspnea: Secondary | ICD-10-CM | POA: Insufficient documentation

## 2019-09-26 MED ORDER — AZELASTINE HCL 0.1 % NA SOLN: 1.0000 | Freq: Two times a day (BID) | NASAL | 3 refills | Status: DC

## 2019-09-26 NOTE — Assessment & Plan Note (Addendum)
Previous history: s/p polypectomy 1970's Current history: nasal congestion- left side greater than right. Using fluticasone 2 sprays each nostril at night.  Continue fluticasone nasal spray 2 sprays each nostril once a day.  If nasal congestion persists may need re-evaluation with ENT/[facility].

## 2019-09-26 NOTE — Assessment & Plan Note (Addendum)
Previous history: at age 79 was diagnosed with pneumonia and pleurisy. Has had shortness of breath with exertion since she was in her 43's. Has seen 3 pulmonologist. Two of the pulmonologist's instructed her that scar tissue was the cause of her dyspnea.  Today's spirometry was normal.  Continue to monitor symptoms. Consider a trial of albuterol if persistent/worsening symptoms.

## 2019-09-26 NOTE — Patient Instructions (Addendum)
Allergic rhinitis Start azelastine nasal spray 1-2 sprays each nostril twice a day as needed for drainage Increase allergy injections to every 2 weeks. Epipen is up to date Continue fluticasone nasal spray 2 sprays each nostril once a day to help with nasal congestion Continue saline nasal spray as needed for nasal symptoms May use fexofenadine 180 mg once a day as needed for runny nose  History of nasal polyp Continue fluticasone nasal spray 2 sprays each nostril once a day. If nasal congestion persists may need evaluation with ENT/[facility]  Dyspnea with exertion Continue to monitor symptoms. May need albuterol in the future.  Please let us know if this treatment plan is not working well for you. Schedule follow up appointment 3-4 months

## 2019-09-26 NOTE — Assessment & Plan Note (Addendum)
Previous history: skin test 02/01/2018 positive to tree pollen, molds and dust mite. Current history: currently getting allergy injections once a week with good benefit. No reactions.  Start azelastine nasal spray 1-2 sprays each nostril twice a day as needed for drainage  Increase allergy injections to every 2 weeks.  Epipen is up to date.  Continue fluticasone nasal spray 2 sprays each nostril once a day to help with nasal congestion.  Continue saline nasal spray as needed for nasal symptoms.  May use fexofenadine 180 mg once a day as needed for runny nose.

## 2019-09-26 NOTE — Progress Notes (Signed)
Follow Up Note  RE: Julia [name] MRN: [id number] DOB: 1940-10-04 Date of Office Visit: 09/26/2019  Referring provider: Leamon Arnt, MD Primary care provider: Leamon Arnt, MD  Chief Complaint: Allergic Rhinitis   History of Present Illness: I had the pleasure of seeing Julia [name] for a follow up visit at the [facility] on 09/26/2019. She is a 79 y.o. female, who is being followed for allergic rhinitis and history of nasal polyp. Her previous allergy office visit was on 09/20/2018 with Dr. Verlin Fester. Today is a regular follow up visit.  She reports an occasional drippy nose when leaning over something hot and also reports a dry/ stuffy nose.  Her left nostril being more congested than her right nostril.  Also, she reports postnasal drip that at times will cause her voice to not be clear.  She has a history of nasal polyp surgery in the 1970s and also reports in the past that she fell and broke her nose and jaw and that they both healed the wrong way.  She is currently getting allergy injections once a week and feels like she is getting benefit and denies any problems.  She is currently using fluticasone nasal spray 2 sprays each nostril at night and using saline nasal spray once a day.  At this time she is not taking fexofenadine due to not wanting to take medication if she does not need to.  She reports shortness of breath with walking hills or steps and it is worse when carrying objects.  This first started in her 61s.  Also, she reports around the age of 46 she was diagnosed with pneumonia and pleurisy.  She has seen 3 pulmonologists in the past (over 20+ years ago) for these symptoms and was told that her dyspnea with exertion was due to scar tissue. No previous inhaler use.   Current medications are as listed in chart.  Assessment and Plan: Julia [name] is a 79 y.o. female with: Perennial and seasonal allergic rhinitis Previous history: skin test 02/01/2018 positive to tree  pollen, molds and dust mite. Current history: currently getting allergy injections once a week with good benefit. No reactions.  Start azelastine nasal spray 1-2 sprays each nostril twice a day as needed for drainage  Increase allergy injections to every 2 weeks.  Epipen is up to date.  Continue fluticasone nasal spray 2 sprays each nostril once a day to help with nasal congestion.  Continue saline nasal spray as needed for nasal symptoms.  May use fexofenadine 180 mg once a day as needed for runny nose.  History of nasal polyp Previous history: s/p polypectomy 1970's Current history: nasal congestion- left side greater than right. Using fluticasone 2 sprays each nostril at night.  Continue fluticasone nasal spray 2 sprays each nostril once a day.  If nasal congestion persists may need re-evaluation with ENT/[facility].  Dyspnea on exertion Previous history: at age 29 was diagnosed with pneumonia and pleurisy. Has had shortness of breath with exertion since she was in her 31's. Has seen 3 pulmonologist. Two of the pulmonologist's instructed her that scar tissue was the cause of her dyspnea.  Today's spirometry was normal.  Continue to monitor symptoms. Consider a trial of albuterol if persistent/worsening symptoms.   Return in about 3 months (around 12/27/2019), or if symptoms worsen or fail to improve.  Meds ordered this encounter  Medications  . azelastine (ASTELIN) 0.1 % nasal spray    Sig: Place 1-2 sprays into  both nostrils 2 (two) times daily. Use 1-2 sprays in each nostril twice a day as needed for drainage.    Dispense:  30 mL    Refill:  3   Diagnostics: Spirometry:  Tracings reviewed. Her effort: Good reproducible efforts. FVC: 2.31L FEV1: 1.74L, 84% predicted FEV1/FVC ratio: 75% Interpretation: Spirometry consistent with normal pattern.  Please see scanned spirometry results for details.  Medication List:  Current Outpatient Medications  Medication Sig Dispense  Refill  . Baclofen 5 MG TABS Take 5-10 mg by mouth at bedtime as needed (leg cramping or jerking). 60 tablet 1  . Calcium Citrate-Vitamin D 315-250 MG-UNIT TABS Take by mouth.    . Carbidopa-Levodopa ER (SINEMET CR) 25-100 MG tablet controlled release TAKE ONE TABLET BY MOUTH FOUR TIMES A DAY 360 tablet 1  . denosumab (PROLIA) 60 MG/ML SOSY injection Inject 60 mg into the skin every 6 (six) months.    . EPINEPHRINE 0.3 mg/0.3 mL IJ SOAJ injection INJECT INTO THE MIDDLE OF THE OUTER THIGH AND HOLD FOR 3 SECONDS AS NEEDED FOR SEVERE ALLERGIC REACTION THEN CALL 911 IF USED. 2 each 1  . Fexofenadine HCl (ALLEGRA ALLERGY PO) Take by mouth.    . fluticasone (FLONASE) 50 MCG/ACT nasal spray Place 2 sprays into both nostrils daily as needed for allergies or rhinitis. 16 mL 2  . levothyroxine (SYNTHROID) 88 MCG tablet TAKE ONE TABLET BY MOUTH DAILY BEFORE BREAKFAST 90 tablet 2  . Multiple Vitamins-Minerals (CENTRUM SILVER PO) Take by mouth.    . NON FORMULARY  Apothecary  Creams-#11 Peripheral Neuropathy cream    . Probiotic Product (PROBIOTIC DAILY PO) Take by mouth.    . tamoxifen (NOLVADEX) 20 MG tablet Take 1 tablet (20 mg total) by mouth daily. 30 tablet 6  . triamcinolone cream (KENALOG) 0.1 % Apply 1 application topically 2 (two) times daily. 30 g 0  . UNABLE TO FIND Med Name: Allergy shots 1/week    . azelastine (ASTELIN) 0.1 % nasal spray Place 1-2 sprays into both nostrils 2 (two) times daily. Use 1-2 sprays in each nostril twice a day as needed for drainage. 30 mL 3  . HYDROcodone-acetaminophen (NORCO/VICODIN) 5-325 MG tablet Take 1 tablet by mouth every 6 (six) hours as needed. (Patient not taking: Reported on 09/26/2019) 15 tablet 0  . traMADol (ULTRAM) 50 MG tablet Take 1 tablet (50 mg total) by mouth every 8 (eight) hours as needed for severe pain. (Patient not taking: Reported on 09/26/2019) 15 tablet 0   No current facility-administered medications for this visit.    Allergies: Allergies  Allergen Reactions  . Cefdinir   . Cholestyramine     GERD  . Contrast Media [Iodinated Diagnostic Agents] Diarrhea  . Ezetimibe   . Meperidine   . Nitrofurantoin Diarrhea  . Statins   . Teriparatide    I reviewed her past medical history, social history, family history, and environmental history and no significant changes have been reported from her previous visit.  Review of Systems  Constitutional: Negative for appetite change, fever and unexpected weight change.  HENT: Positive for postnasal drip and rhinorrhea. Negative for congestion.   Eyes: Negative for itching.  Respiratory: Negative for cough, chest tightness and wheezing.        Reports shortness of breath when walking up a hill/steps and carrying objects. This started when she was in her 20's  Cardiovascular: Negative for chest pain and palpitations.  Gastrointestinal: Negative for abdominal pain.  Genitourinary: Negative for difficulty urinating.  Skin: Negative for rash.  Allergic/Immunologic: Positive for environmental allergies.  Neurological: Negative for headaches.   Objective: BP 116/70 (BP Location: Right Arm, Patient Position: Sitting, Cuff Size: Normal)   Pulse 86   Resp 16   SpO2 97%  There is no height or weight on file to calculate BMI. Physical Exam Vitals and nursing note reviewed.  Constitutional:      Appearance: Normal appearance. She is well-developed.  HENT:     Head: Normocephalic and atraumatic.     Comments: Pharynx normal. Eyes normal. Ears normal. Nose: left nostril more congested than right nostril. Clear drainage noted.    Right Ear: Tympanic membrane, ear canal and external ear normal.     Left Ear: Tympanic membrane, ear canal and external ear normal.     Nose: Nose normal.     Mouth/Throat:     Mouth: Mucous membranes are moist.     Pharynx: Oropharynx is clear.  Eyes:     Conjunctiva/sclera: Conjunctivae normal.  Cardiovascular:     Rate and Rhythm:  Normal rate and regular rhythm.     Heart sounds: Normal heart sounds. No murmur heard.   Pulmonary:     Effort: Pulmonary effort is normal.     Breath sounds: Normal breath sounds. No wheezing, rhonchi or rales.     Comments: Lungs clear to auscultation. Musculoskeletal:     Cervical back: Neck supple.  Skin:    General: Skin is warm.     Findings: No rash.  Neurological:     Mental Status: She is alert and oriented to person, place, and time.  Psychiatric:        Mood and Affect: Mood normal.        Behavior: Behavior normal.        Thought Content: Thought content normal.        Judgment: Judgment normal.    Previous notes and tests were reviewed. The plan was reviewed with the patient/family, and all questions/concerned were addressed.  It was my pleasure to see Julia [name] today and participate in her care. Please feel free to contact me with any questions or concerns.  Sincerely,  Althea Charon, FNP [facility]  I performed a history and physical examination of the patient and discussed her management with the Nurse Practitioner. I reviewed the Nurse Practitioner's note and agree with the documented findings and plan of care. The note in its entirety was edited by myself, including the physical exam, assessment, and plan.   Rexene Alberts, DO [facility]

## 2019-09-27 ENCOUNTER — Encounter: Payer: Self-pay | Admitting: Physical Therapy

## 2019-09-27 ENCOUNTER — Ambulatory Visit (INDEPENDENT_AMBULATORY_CARE_PROVIDER_SITE_OTHER): Payer: Medicare HMO | Admitting: Physical Therapy

## 2019-09-27 DIAGNOSIS — M25511 Pain in right shoulder: Secondary | ICD-10-CM

## 2019-09-27 DIAGNOSIS — M6281 Muscle weakness (generalized): Secondary | ICD-10-CM | POA: Diagnosis not present

## 2019-09-27 DIAGNOSIS — M25552 Pain in left hip: Secondary | ICD-10-CM

## 2019-09-27 NOTE — Therapy (Signed)
[facility] [address] [place], [name], [address] Phone: [phone]   Fax:  [phone]  Physical Therapy Treatment  Patient Details  Name: Julia [name] MRN: [id number] Date of Birth: 08/28/1940 Referring Provider (PT): Lynne Leader   Encounter Date: 09/27/2019   PT End of Session - 09/27/19 1018    Visit Number 10    Number of Visits 20    Date for PT Re-Evaluation 10/26/19    Authorization Type [employer]- recert done prev.    PT Start Time 1012    PT Stop Time 1058    PT Time Calculation (min) 46 min    Activity Tolerance Patient tolerated treatment well    Behavior During Therapy WFL for tasks assessed/performed           Past Medical History:  Diagnosis Date  . Arthritis   . Breast cancer ([employer])   . Colon cancer ([place])    previous colon cancer  . Hyperlipidemia   . 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 RLS (restless legs syndrome) 07/14/2018  . Secondary malignant neoplasm of unspecified ovary [facility])     Past Surgical History:  Procedure Laterality Date  . ADENOIDECTOMY    . BREAST LUMPECTOMY Left    radiation only  . BUNIONECTOMY    . COLON SURGERY    . HEMORRHOID SURGERY    . LAPAROSCOPY    . NASAL SINUS SURGERY    . OOPHORECTOMY    . TONSILLECTOMY    . TUBAL LIGATION      There were no vitals filed for this visit.   Subjective Assessment - 09/27/19 1015    Subjective Pt was not feeling well for a couple days, did less activity and had less pain in hip. She did increased houswork yesterday and has increased soreness after that.    Currently in Pain? Yes    Pain Score 2     Pain Location Hip    Pain Descriptors / Indicators Aching    Pain Type Acute pain    Pain Onset More than a month ago    Pain Frequency Intermittent                             [facility] Adult PT Treatment/Exercise - 09/27/19 0001      Knee/Hip Exercises: Aerobic   Recumbent Bike L1 x 8 min;       Knee/Hip  Exercises: Standing   Hip Flexion 20 reps;Knee bent;Both    Hip Abduction 10 reps;2 sets    Other Standing Knee Exercises Mini squats x 10, with education on mechanics.       Knee/Hip Exercises: Seated   Sit to Sand 10 reps      Knee/Hip Exercises: Supine   Bridges 20 reps    Other Supine Knee/Hip Exercises Clam GTB x 20;       Knee/Hip Exercises: Sidelying   Hip ABduction 15 reps;Both      Shoulder Exercises: Standing   Flexion 15 reps;Both;AROM    ABduction 15 reps;AROM;Both    Row 20 reps    Theraband Level (Shoulder Row) Level 3 (Green)    Other Standing Exercises Wall push ups x 15; Bicep curl 3 lb x 20      Manual Therapy   Manual Therapy Manual Traction    Manual therapy comments skilled palpation and monitoring of soft tissue with dry needling.     Joint Mobilization hip  inf mobs gr 3  bil;     Soft tissue mobilization STM L glute     Manual Traction long leg distraction on L for hip pump x 2 min             Trigger Point Dry Needling - 09/27/19 0001    Consent Given? Yes    Education Handout Provided Previously provided    Muscles Treated Back/Hip Gluteus minimus;Gluteus medius;Piriformis    Gluteus Minimus Response Palpable increased muscle length   L   Gluteus Medius Response Palpable increased muscle length   L   Piriformis Response Palpable increased muscle length   L                 PT Short Term Goals - 09/18/19 0911      PT SHORT TERM GOAL #1   Title Pt to be independent with initial HEP    Time 2    Period Weeks    Status Achieved    Target Date 08/16/19      PT SHORT TERM GOAL #2   Title Pt to demo decreased pain in L hip to 0-4/10 with activiity    Time 3    Period Weeks    Status Partially Met    Target Date 08/23/19             PT Long Term Goals - 09/18/19 0911      PT LONG TERM GOAL #1   Title Pt to be independent with final HEp    Time 6    Period Weeks    Status On-going    Target Date 10/26/19      PT LONG TERM  GOAL #2   Title Pt to report decreased pain in L hip to 0-2/10 with activity and sleeping    Time 6    Period Weeks    Status On-going    Target Date 10/26/19      PT LONG TERM GOAL #3   Title Pt to demo decreased pain in R shoulder/scapular region to 0-2/10 with reaching, and IADLS.    Time 6    Period Weeks    Status Partially Met    Target Date 10/26/19      PT LONG TERM GOAL #4   Title Pt to demo ability for full shoulder AROM bilaterally, without pain,to improve ability for ADLS    Time 6    Period Weeks    Status Partially Met    Target Date 10/26/19      PT LONG TERM GOAL #5   Title Pt to demo improved strength of bil shoulders and scapular muscles to at least 4/5 to improve ability for reaching, lifting, and IADLS.    Time 6    Period Weeks    Status New    Target Date 10/26/19                 Plan - 09/27/19 1306    Clinical Impression Statement Pt continues to have increased soreness after increased bending, and housework. Discussed ergonomics at length today, and need to take rest breaks between activities. Most pain concentrated in L glute musculature. Manual and dry needling done today to address. Plan to see pt for 1-2 more week, and progress to d/c if pain decreases.    Rehab Potential Good    PT Frequency 2x / week    PT Duration 6 weeks    PT Treatment/Interventions ADLs/Self Care Home Management;Cryotherapy;Electrical Stimulation;Gait training;DME  Instruction;Contrast Bath;Ultrasound;Traction;Moist Heat;Iontophoresis 70m/ml Dexamethasone;Stair training;Functional mobility training;Therapeutic activities;Therapeutic exercise;Balance training;Neuromuscular re-education;Manual techniques;Orthotic Fit/Training;Patient/family education;Passive range of motion;Dry needling;Taping;Spinal Manipulations;Joint Manipulations    PT Home Exercise Plan [email or web address]    Consulted and Agree with Plan of Care Patient           Patient will benefit from skilled  therapeutic intervention in order to improve the following deficits and impairments:     Visit Diagnosis: Pain in left hip  Acute pain of right shoulder  Muscle weakness (generalized)     Problem List Patient Active Problem List   Diagnosis Date Noted  . Dyspnea on exertion 09/26/2019  . Pulmonary nodule 06/20/2019  . Statin intolerance 10/13/2018  . Pelvic floor dysfunction 08/26/2018  . Fecal smearing 08/26/2018  . Fecal urgency 08/26/2018  . RLS (restless legs syndrome) 07/14/2018  . 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 Female bladder prolapse 04/07/2018  . 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 Perennial and seasonal allergic rhinitis 02/01/2018  . History of nasal polyp 02/01/2018  . Anxiety 09/29/2017  . Parkinson's disease ([place] 09/29/2017  . Dysfunctional voiding of urine 09/13/2016  . Female stress incontinence 09/13/2016  . 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 Gastroesophageal reflux disease 09/13/2016  . Age-related osteoporosis without current pathological fracture 07/20/2016  . 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 Hyperlipidemia 03/07/2015  . Impaired fasting glucose 03/07/2015  . Atrophic vaginitis 02/13/2015  . History of colon cancer 02/13/2015  . Hypothyroidism due to Hashimoto's thyroiditis 02/13/2015  . Irritable bowel syndrome with both constipation and diarrhea 02/13/2015  . Malignant neoplasm of female breast ([place] 02/13/2015  . Recurrent UTI 02/13/2015  . Vitamin D deficiency disease 02/13/2015    LLyndee Hensen PT, DPT 1:11 PM  09/27/19    [place] [address]Phone: [phone]  Fax:  [phone] Name: MAunna SnooksMRN: [id number] of Birth: [birth year]

## 2019-09-28 ENCOUNTER — Ambulatory Visit: Payer: Medicare HMO | Admitting: Podiatry

## 2019-10-04 ENCOUNTER — Ambulatory Visit (INDEPENDENT_AMBULATORY_CARE_PROVIDER_SITE_OTHER): Payer: Medicare HMO | Admitting: Physical Therapy

## 2019-10-04 ENCOUNTER — Other Ambulatory Visit: Payer: Self-pay

## 2019-10-04 ENCOUNTER — Encounter: Payer: Self-pay | Admitting: Physical Therapy

## 2019-10-04 DIAGNOSIS — M25511 Pain in right shoulder: Secondary | ICD-10-CM

## 2019-10-04 DIAGNOSIS — M25552 Pain in left hip: Secondary | ICD-10-CM

## 2019-10-04 DIAGNOSIS — M6281 Muscle weakness (generalized): Secondary | ICD-10-CM | POA: Diagnosis not present

## 2019-10-04 NOTE — Patient Instructions (Signed)
Access Code: [email or web address] URL: [email or web address] Date: 10/04/2019 Prepared by: Lyndee Hensen  Exercises Seated Shoulder Rolls - 2-3 x daily - 1 sets - 10 reps Seated Scapular Retraction - 2-3 x daily - 1 sets - 10 reps Supine Shoulder Flexion Extension AAROM with Dowel - 1 x daily - 1-2 sets - 10 reps Supine Piriformis Stretch Pulling Heel to Hip - 2 x daily - 3 reps - 30 hold Supine Single Knee to Chest Stretch - 2 x daily - 3 reps - 30 hold Hooklying Clamshell with Resistance - 1 x daily - 2 sets - 10 reps Standing Hip Abduction with Counter Support - 1 x daily - 2 sets - 10 reps Sidelying Hip Abduction - 1 x daily - 2 sets - 10 reps Standing Shoulder Scaption - 1 x daily - 1 sets - 10 reps Supine Diaphragmatic Breathing - 2 x daily - 7 x weekly - 1 sets - 10 reps - 5 sec hold Supine Chest Stretch with Elbows Bent - 2 x daily - 7 x weekly - 3 reps - 1 sets - 30 sec hold Seated Hip Flexor Stretch - 2 x daily - 7 x weekly - 3 reps - 1 sets - 30 sec hold

## 2019-10-07 ENCOUNTER — Encounter: Payer: Self-pay | Admitting: Physical Therapy

## 2019-10-07 NOTE — Therapy (Signed)
[facility] [address] [place], [name], [address] Phone: [phone]   Fax:  [phone]  Physical Therapy Treatment/Discharge   Patient Details  Name: Julia [name] MRN: [id number] Date of Birth: 04-Feb-1941 Referring Provider (PT): Lynne Leader   Encounter Date: 10/04/2019   PT End of Session - 10/07/19 1334    Visit Number 11    Number of Visits 20    Date for PT Re-Evaluation 10/26/19    Authorization Type [employer]- recert done prev.    PT Start Time 1102    PT Stop Time 1144    PT Time Calculation (min) 42 min    Activity Tolerance Patient tolerated treatment well    Behavior During Therapy WFL for tasks assessed/performed           Past Medical History:  Diagnosis Date  . Arthritis   . Breast cancer ([place])   . Colon cancer ([place])    previous colon cancer  . Hyperlipidemia   . 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 RLS (restless legs syndrome) 07/14/2018  . Secondary malignant neoplasm of unspecified ovary [facility])     Past Surgical History:  Procedure Laterality Date  . ADENOIDECTOMY    . BREAST LUMPECTOMY Left    radiation only  . BUNIONECTOMY    . COLON SURGERY    . HEMORRHOID SURGERY    . LAPAROSCOPY    . NASAL SINUS SURGERY    . OOPHORECTOMY    . TONSILLECTOMY    . TUBAL LIGATION      There were no vitals filed for this visit.   Subjective Assessment - 10/07/19 1332    Subjective Pt with no new complaints. Has good days and bad days. Still having difficulty sleeping due to leg tremors at times. Hip pain variable, depending on activity.    Patient Stated Goals decreased pain in hips    Currently in Pain? Yes    Pain Score 2     Pain Location Hip    Pain Orientation Left    Pain Descriptors / Indicators Aching    Pain Type Acute pain    Pain Onset More than a month ago    Pain Frequency Intermittent    Pain Score 0    Pain Location Shoulder              [facility] PT Assessment - 10/07/19 0001       AROM   Overall AROM Comments Shoulder ROM : WFL,       Strength   Overall Strength Comments Scapular: 4/5     Right Shoulder Flexion 4+/5    Right Shoulder ABduction 4-/5    Right Shoulder Internal Rotation 4+/5    Right Shoulder External Rotation 4+/5    Left Hip Flexion 4+/5    Left Hip External Rotation 4+/5    Left Hip Internal Rotation 4+/5    Left Hip ABduction 4+/5                         [facility] Adult PT Treatment/Exercise - 10/07/19 0001      Knee/Hip Exercises: Aerobic   Recumbent Bike L1 x 8 min;       Knee/Hip Exercises: Standing   Hip Flexion 20 reps;Knee bent;Both    Hip Abduction 10 reps;2 sets      Knee/Hip Exercises: Seated   Sit to Sand 10 reps      Knee/Hip Exercises: Supine   Constance Haw  20 reps    Other Supine Knee/Hip Exercises Clam GTB x 20;       Knee/Hip Exercises: Sidelying   Hip ABduction 15 reps;Both      Shoulder Exercises: Standing   Flexion 15 reps;Both;AROM    ABduction 15 reps;AROM;Both    Row 20 reps    Theraband Level (Shoulder Row) Level 3 (Green)    Other Standing Exercises [place] push ups x 15; Bicep curl 2 lb x 20      Manual Therapy   Manual Therapy Manual Traction    Manual therapy comments --    Joint Mobilization hip inf and post mobs gr 3  bil;     Soft tissue mobilization STM L glute tennis ball     Manual Traction long leg distraction on L for hip pump x 2 min                   PT Education - 10/07/19 1333    Education Details Final HEp reviewed in detail.    Person(s) Educated Patient    Methods Explanation;Demonstration;Verbal cues;Handout    Comprehension Verbalized understanding;Returned demonstration;Verbal cues required            PT Short Term Goals - 10/07/19 1335      PT SHORT TERM GOAL #1   Title Pt to be independent with initial HEP    Time 2    Period Weeks    Status Achieved    Target Date 08/16/19      PT SHORT TERM GOAL #2   Title Pt to demo decreased pain in L hip  to 0-4/10 with activiity    Time 3    Period Weeks    Status Partially Met    Target Date 08/23/19             PT Long Term Goals - 10/07/19 1335      PT LONG TERM GOAL #1   Title Pt to be independent with final HEp    Time 6    Period Weeks    Status Achieved      PT LONG TERM GOAL #2   Title Pt to report decreased pain in L hip to 0-2/10 with activity and sleeping    Time 6    Period Weeks    Status Partially Met      PT LONG TERM GOAL #3   Title Pt to demo decreased pain in R shoulder/scapular region to 0-2/10 with reaching, and IADLS.    Time 6    Period Weeks    Status Achieved      PT LONG TERM GOAL #4   Title Pt to demo ability for full shoulder AROM bilaterally, without pain,to improve ability for ADLS    Time 6    Period Weeks    Status Achieved      PT LONG TERM GOAL #5   Title Pt to demo improved strength of bil shoulders and scapular muscles to at least 4/5 to improve ability for reaching, lifting, and IADLS.    Time 6    Period Weeks    Status Achieved                 Plan - 10/07/19 1340    Clinical Impression Statement Pt continues to have increased soreness after increased bending, and housework. Continued education at length on posture and mechanics for bending and housework and need to take rest breaks between activities. Most pain concentrated in L  glute musculature, has improved, but has not been eliminated. Also may be stemming from lumbar spine. Pt having diffiuclty sleeping due to tremors, discussed need to follow up with MD for discussion of medications. Pt was put on something previously, but has not been taking it. She has made good improvments with shoulder ROM, now [facility], and pain free, with decreased crepitus. Pt has met goals at this time, and is ready for d/c to HEP.    Rehab Potential Good    PT Frequency 2x / week    PT Duration 6 weeks    PT Treatment/Interventions ADLs/Self Care Home Management;Cryotherapy;Electrical  Stimulation;Gait training;DME Instruction;Contrast Bath;Ultrasound;Traction;Moist Heat;Iontophoresis 90m/ml Dexamethasone;Stair training;Functional mobility training;Therapeutic activities;Therapeutic exercise;Balance training;Neuromuscular re-education;Manual techniques;Orthotic Fit/Training;Patient/family education;Passive range of motion;Dry needling;Taping;Spinal Manipulations;Joint Manipulations    PT Home Exercise Plan [id number]    Consulted and Agree with Plan of Care Patient           Patient will benefit from skilled therapeutic intervention in order to improve the following deficits and impairments:     Visit Diagnosis: Pain in left hip  Acute pain of right shoulder  Muscle weakness (generalized)     Problem List Patient Active Problem List   Diagnosis Date Noted  . Dyspnea on exertion 09/26/2019  . Pulmonary nodule 06/20/2019  . Statin intolerance 10/13/2018  . Pelvic floor dysfunction 08/26/2018  . Fecal smearing 08/26/2018  . Fecal urgency 08/26/2018  . RLS (restless legs syndrome) 07/14/2018  . 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 Female bladder prolapse 04/07/2018  . 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 Perennial and seasonal allergic rhinitis 02/01/2018  . History of nasal polyp 02/01/2018  . Anxiety 09/29/2017  . Parkinson's disease ([place] 09/29/2017  . Dysfunctional voiding of urine 09/13/2016  . Female stress incontinence 09/13/2016  . 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 Gastroesophageal reflux disease 09/13/2016  . Age-related osteoporosis without current pathological fracture 07/20/2016  . 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 Hyperlipidemia 03/07/2015  . Impaired fasting glucose 03/07/2015  . Atrophic vaginitis 02/13/2015  . History of colon cancer 02/13/2015  . Hypothyroidism due to Hashimoto's thyroiditis 02/13/2015  . Irritable bowel syndrome with both constipation and diarrhea 02/13/2015  .  Malignant neoplasm of female breast ([place] 02/13/2015  . Recurrent UTI 02/13/2015  . Vitamin D deficiency disease 02/13/2015    LLyndee Hensen7/12/2019, 1:43 PM  [place] [address]Phone: [phone]  Fax:  [phone] Name: MTyquisha SharpsMRN: [id number] of Birth: [birth year]    PHYSICAL THERAPY DISCHARGE SUMMARY  Visits from Start of Care: 11 Plan: Patient agrees to discharge.  Patient goals were partially met. Patient is being discharged due to meeting the stated rehab goals.  ?????    LLyndee Hensen PT, DPT 1:43 PM  10/07/19

## 2019-10-10 ENCOUNTER — Other Ambulatory Visit: Payer: Self-pay

## 2019-10-10 ENCOUNTER — Ambulatory Visit (INDEPENDENT_AMBULATORY_CARE_PROVIDER_SITE_OTHER): Payer: Medicare HMO

## 2019-10-10 DIAGNOSIS — J309 Allergic rhinitis, unspecified: Secondary | ICD-10-CM | POA: Diagnosis not present

## 2019-10-17 ENCOUNTER — Other Ambulatory Visit: Payer: Self-pay

## 2019-10-17 ENCOUNTER — Encounter: Payer: Self-pay | Admitting: Family Medicine

## 2019-10-17 ENCOUNTER — Ambulatory Visit (INDEPENDENT_AMBULATORY_CARE_PROVIDER_SITE_OTHER): Payer: Medicare HMO | Admitting: Family Medicine

## 2019-10-17 VITALS — BP 124/72 | HR 91 | Temp 97.9°F | Resp 18 | Ht 64.0 in | Wt 136.0 lb

## 2019-10-17 DIAGNOSIS — Z85038 Personal history of other malignant neoplasm of large intestine: Secondary | ICD-10-CM

## 2019-10-17 DIAGNOSIS — E038 Other specified hypothyroidism: Secondary | ICD-10-CM | POA: Diagnosis not present

## 2019-10-17 DIAGNOSIS — E063 Autoimmune thyroiditis: Secondary | ICD-10-CM

## 2019-10-17 DIAGNOSIS — Z853 Personal history of malignant neoplasm of breast: Secondary | ICD-10-CM | POA: Diagnosis not present

## 2019-10-17 DIAGNOSIS — Z789 Other specified health status: Secondary | ICD-10-CM

## 2019-10-17 DIAGNOSIS — E782 Mixed hyperlipidemia: Secondary | ICD-10-CM

## 2019-10-17 DIAGNOSIS — M810 Age-related osteoporosis without current pathological fracture: Secondary | ICD-10-CM

## 2019-10-17 DIAGNOSIS — Z0000 Encounter for general adult medical examination without abnormal findings: Secondary | ICD-10-CM

## 2019-10-17 DIAGNOSIS — R7301 Impaired fasting glucose: Secondary | ICD-10-CM | POA: Diagnosis not present

## 2019-10-17 DIAGNOSIS — G20 Parkinson's disease: Secondary | ICD-10-CM

## 2019-10-17 DIAGNOSIS — G620 Drug-induced polyneuropathy: Secondary | ICD-10-CM

## 2019-10-17 LAB — COMPREHENSIVE METABOLIC PANEL
ALT: 4 U/L (ref 0–35)
AST: 14 U/L (ref 0–37)
Albumin: 4.1 g/dL (ref 3.5–5.2)
Alkaline Phosphatase: 36 U/L — ABNORMAL LOW (ref 39–117)
BUN: 22 mg/dL (ref 6–23)
CO2: 30 mEq/L (ref 19–32)
Calcium: 9.5 mg/dL (ref 8.4–10.5)
Chloride: 100 mEq/L (ref 96–112)
Creatinine, Ser: 0.95 mg/dL (ref 0.40–1.20)
GFR: 57.02 mL/min — ABNORMAL LOW (ref >60.00)
Glucose, Bld: 89 mg/dL (ref 70–99)
Potassium: 4.1 mEq/L (ref 3.5–5.1)
Sodium: 137 mEq/L (ref 135–145)
Total Bilirubin: 0.4 mg/dL (ref 0.2–1.2)
Total Protein: 6.3 g/dL (ref 6.0–8.3)

## 2019-10-17 LAB — CBC WITH DIFFERENTIAL/PLATELET
Basophils Absolute: 0.1 10*3/uL (ref 0.0–0.1)
Basophils Relative: 1.1 % (ref 0.0–3.0)
Eosinophils Absolute: 0.2 10*3/uL (ref 0.0–0.7)
Eosinophils Relative: 3.2 % (ref 0.0–5.0)
HCT: 39.6 % (ref 36.0–46.0)
Hemoglobin: 13.4 g/dL (ref 12.0–15.0)
Lymphocytes Relative: 11.4 % — ABNORMAL LOW (ref 12.0–46.0)
Lymphs Abs: 0.8 10*3/uL (ref 0.7–4.0)
MCHC: 33.8 g/dL (ref 30.0–36.0)
MCV: 92 fl (ref 78.0–100.0)
Monocytes Absolute: 0.6 10*3/uL (ref 0.1–1.0)
Monocytes Relative: 8.8 % (ref 3.0–12.0)
Neutro Abs: 5.5 10*3/uL (ref 1.4–7.7)
Neutrophils Relative %: 75.5 % (ref 43.0–77.0)
Platelets: 207 10*3/uL (ref 150.0–400.0)
RBC: 4.3 Mil/uL (ref 3.87–5.11)
RDW: 13.6 % (ref 11.5–15.5)
WBC: 7.2 10*3/uL (ref 4.0–10.5)

## 2019-10-17 LAB — TSH: TSH: 1.12 u[IU]/mL (ref 0.35–4.50)

## 2019-10-17 LAB — VITAMIN D 25 HYDROXY (VIT D DEFICIENCY, FRACTURES): VITD: 76.43 ng/mL (ref 30.00–100.00)

## 2019-10-17 LAB — HEMOGLOBIN A1C: Hgb A1c MFr Bld: 6.3 % (ref 4.6–6.5)

## 2019-10-17 NOTE — Progress Notes (Signed)
Subjective  Chief Complaint  Patient presents with  . Annual Exam    Non-fasting labs. Concerned for her toes turning inward  . Hypothyroidism  . Hyperlipidemia  . Osteoporosis    HPI: Julia [name] is a 79 y.o. female who presents to [facility] today for a Female Wellness Visit. She also has the concerns and/or needs as listed above in the chief complaint. These will be addressed in addition to the Health Maintenance Visit.   Wellness Visit: annual visit with health maintenance review and exam without Pap   HM: screens and cancer surveillance is up to date. Chronic disease f/u and/or acute problem visit: (deemed necessary to be done in addition to the wellness visit):  Hypothyroidism; due for recheck. Energy level is fair. Gets tired but that is mostly due to poor sleep at night  Peripheral neuropathy: on low dose gabapentin. At times, it awakens her.   Back and hip pain: improved with PT but still present and can awaken her. Had recent PET scan that was negative for mets  HLD but statin and zetia intolerant. No further check indicated  Parkinson's disease: noticed that she is more emotional. Also admits it has been a hard long year due to covid.  IFG history. No sxs of hyperglycemia  Osteoporosis: on prolia. Next due September.  F/u with oncology q 6 months due to breast cancer tx and h/o colon cancer.   Assessment  1. Annual physical exam   2. Age-related osteoporosis without current pathological fracture   3. History of colon cancer   4. History of breast cancer left, 2016   5. Mixed hyperlipidemia   6. Statin intolerance   7. Drug-induced polyneuropathy ([place])   8. Parkinson's disease ([place])   9. Hypothyroidism due to Hashimoto's thyroiditis   10. Impaired fasting glucose      Plan  Female Wellness Visit:  Age appropriate Health Maintenance and Prevention measures were discussed with patient. Included topics are cancer screening  recommendations, ways to keep healthy (see AVS) including dietary and exercise recommendations, regular eye and dental care, use of seat belts, and avoidance of moderate alcohol use and tobacco use.   BMI: discussed patient's BMI and encouraged positive lifestyle modifications to help get to or maintain a target BMI.  HM needs and immunizations were addressed and ordered. See below for orders. See HM and immunization section for updates.  Routine labs and screening tests ordered including cmp, cbc and lipids where appropriate.  Discussed recommendations regarding Vit D and calcium supplementation (see AVS)  Chronic disease management visit and/or acute problem visit:  Recheck thyroid levels.  Needs refills on meds  Neuropathy: active. rec increasing dose of gabapentin as this can help her sleep as well.   Osteoporosis on calcium/d and prolia. Recheck labs  PD per neuro  IFG: recheck  OA and pain in hip/back; continue home exercises  Toenail fungus: has seen podiatry in the past.  Follow up: Return in about 1 year (around 10/16/2020) for complete physical.  Orders Placed This Encounter  Procedures  . CBC with Differential/Platelet  . Comprehensive metabolic panel  . TSH  . VITAMIN D 25 Hydroxy (Vit-D Deficiency, Fractures)  . Hemoglobin A1c   No orders of the defined types were placed in this encounter.     Lifestyle: Body mass index is 23.34 kg/m. Wt Readings from Last 3 Encounters:  10/17/19 136 lb (61.7 kg)  09/18/19 136 lb 3.2 oz (61.8 kg)  09/13/19 136  lb 6.4 oz (61.9 kg)     Patient Active Problem List   Diagnosis Date Noted  . Statin intolerance 10/13/2018    Priority: High    Statin and zetia   . Parkinson's disease ([place]) 09/29/2017    Priority: High    PD L since 2018, anx, cancer with chemotherapy-induced neuropathy at the hands and feet  2016 B12 and TSH unremarkable   . Age-related osteoporosis without current pathological fracture 07/20/2016     Priority: High    Reports took fosamax x 8 years, then forteo x 6 months.  DEXA 03/2018: osteoporosis, T = -3.8   . Mixed hyperlipidemia 03/07/2015    Priority: High    Statin and zetia intolerant   . Impaired fasting glucose 03/07/2015    Priority: High    Noted 01/23/14   . 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    . Hypothyroidism due to Hashimoto's thyroiditis 02/13/2015    Priority: High  . History of breast cancer left, 2016 02/13/2015    Priority: High    Left, 2016; s/p lumpectomy and rads TX and tamoxifen;    . Pulmonary nodule 06/20/2019    Priority: Medium    Left; incidental on CT 05/2019; rec repeat in 05/2020 due to h/o primary malignancy.    . RLS (restless legs syndrome) 07/14/2018    Priority: Medium  . 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 Anxiety 09/29/2017    Priority: Medium  . Dysfunctional voiding of urine 09/13/2016    Priority: Medium  . Female stress incontinence 09/13/2016    Priority: Medium  . 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 Gastroesophageal reflux disease 09/13/2016    Priority: Medium  . Drug-induced polyneuropathy ([place]) 03/07/2015    Priority: Medium    Noted 12/26/13  IMO 2017 Regulatory 2 Release Noted 12/26/13  IMO 2017 Regulatory 2 Release   . Irritable bowel syndrome with both constipation and diarrhea 02/13/2015    Priority: Medium  . 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 See media section   . Perennial and seasonal allergic rhinitis 02/01/2018    Priority: Low  . Atrophic vaginitis 02/13/2015    Priority: Low  . Recurrent UTI 02/13/2015    Priority: Low  . Vitamin D deficiency disease 02/13/2015    Priority: Low  . Pelvic floor dysfunction 08/26/2018  . Female  bladder prolapse 04/07/2018  . History of nasal polyp 02/01/2018   Health Maintenance  Topic Date Due  . Hepatitis C Screening  Never done  . INFLUENZA VACCINE  10/29/2019  . DEXA SCAN  04/04/2020  . MAMMOGRAM  04/30/2020  . COVID-19 Vaccine  Completed  . PNA vac Low Risk Adult  Completed   Immunization History  Administered Date(s) Administered  . Fluad Quad(high Dose 65+) 12/16/2018  . Influenza, High Dose Seasonal PF 01/05/2015  . Influenza,inj,Quad PF,6+ Mos 01/10/2018  . Moderna SARS-COVID-2 Vaccination 04/10/2019, 05/08/2019  . Pneumococcal Conjugate-13 01/16/2014  . Pneumococcal Polysaccharide-23 12/10/2007, 01/29/2015  . Zoster Recombinat (Shingrix) 10/15/2017, 01/12/2018, 02/17/2018   We updated and reviewed the patient's past history in detail and it is documented below. Allergies: Patient is allergic to cefdinir, cholestyramine, contrast media [iodinated diagnostic agents], ezetimibe, meperidine,  nitrofurantoin, statins, and teriparatide. 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 Past Surgical History Patient  has a past surgical history that includes Tonsillectomy; Hemorrhoid surgery; laparoscopy; Bunionectomy; Tubal ligation; Colon surgery; Oophorectomy; Nasal sinus surgery; Adenoidectomy; and Breast lumpectomy (Left). Family History: Patient family history includes Arthritis in her father, mother, and sister; Cancer in her brother, father, sister, sister, and sister; Depression in her father; Diabetes in her mother and sister; Heart disease in her mother; Hyperlipidemia in her brother and sister; Hypertension in her mother; Osteoporosis in her father. Social History:  Patient  reports that she has never smoked. She has never used smokeless  tobacco. She reports current alcohol use. She reports that she does not use drugs.  Review of Systems: Constitutional: negative for fever or malaise Ophthalmic: negative for photophobia, double vision or loss of vision Cardiovascular: negative for chest pain, dyspnea on exertion, or new LE swelling Respiratory: negative for SOB or persistent cough Gastrointestinal: negative for abdominal pain, change in bowel habits or melena Genitourinary: negative for dysuria or gross hematuria, no abnormal uterine bleeding or disharge Musculoskeletal: negative for new gait disturbance or muscular weakness Integumentary: negative for new or persistent rashes, no breast lumps Neurological: negative for TIA or stroke symptoms Psychiatric: negative for SI or delusions Allergic/Immunologic: negative for hives  Patient Care Team    Relationship Specialty Notifications Start End  Leamon Arnt, MD PCP - General Family Medicine  01/10/18   Kathrynn Ducking, MD Consulting Physician Neurology  04/12/18   Bobbitt, Sedalia Muta, MD Consulting Physician Allergy and Immunology  01/17/19   Mansouraty, Telford Nab., MD Consulting Physician Gastroenterology  01/27/19   Truitt Merle, MD Consulting Physician Oncology  03/17/19   Lyndee Hensen, PT Physical Therapist Physical Therapy  08/02/19   Trula Slade, DPM Consulting Physician Podiatry  10/17/19     Objective  Vitals: BP 124/72   Pulse 91   Temp 97.9 F (36.6 C) (Temporal)   Resp 18   Ht 5\' 4"  (1.626 m)   Wt 136 lb (61.7 kg)   SpO2 97%   BMI 23.34 kg/m  General:  Well developed, well nourished, no acute distress  Psych:  Alert and orientedx3,worried mood and affect HEENT:  Normocephalic, atraumatic, non-icteric sclera,  supple neck without adenopathy, mass or thyromegaly Cardiovascular:  Normal S1, S2, RRR without gallop, rub or murmur Respiratory:  Good breath sounds bilaterally, CTAB with normal respiratory effort Gastrointestinal: normal bowel  sounds, soft, non-tender, no noted masses. No HSM MSK: no deformities, contusions. Joints are without erythema or swelling.  Skin:  Warm, no rashes or suspicious lesions noted Neurologic:    Mental status is normal. Tremor present Breast Exam: No mass, skin retraction or nipple discharge is appreciated in either breast. No axillary adenopathy. Fibrocystic changes are not noted    Commons side effects, risks, benefits, and alternatives for medications and treatment plan prescribed today were discussed, and the patient expressed understanding of the given instructions. Patient is instructed to call or message via MyChart if he/she has any questions or concerns regarding our treatment plan. No barriers to understanding were identified. We discussed Red Flag symptoms and signs in detail. Patient expressed understanding regarding what to do in case of urgent or emergency type symptoms.   Medication list was reconciled, printed and provided to the patient  in AVS. Patient instructions and summary information was reviewed with the patient as documented in the AVS. This note was prepared with assistance of Dragon voice recognition software. Occasional wrong-word or sound-a-like substitutions may have occurred due to the inherent limitations of voice recognition software  This visit occurred during the SARS-CoV-2 public health emergency.  Safety protocols were in place, including screening questions prior to the visit, additional usage of staff PPE, and extensive cleaning of exam room while observing appropriate contact time as indicated for disinfecting solutions.

## 2019-10-17 NOTE — Patient Instructions (Addendum)
Please return in 12 months for your annual complete physical; please come fasting. Please return in September for your next prolia injection as scheduled: 12/21/2019  I will release your lab results to you on your MyChart account with further instructions. Please reply with any questions.   You could increase your gabapentin dose to 300mg  at night to help with sleep and your numbness and neuropathy symptoms in your feet.   If you have any questions or concerns, please don't hesitate to send me a message via MyChart or call the office at [phone]. Thank you for visiting with [place] today! It's our pleasure caring for you.

## 2019-10-18 ENCOUNTER — Encounter: Payer: Medicare HMO | Admitting: Family Medicine

## 2019-10-18 ENCOUNTER — Encounter: Payer: Self-pay | Admitting: Family Medicine

## 2019-10-18 ENCOUNTER — Ambulatory Visit: Payer: Medicare HMO | Admitting: Family Medicine

## 2019-10-18 VITALS — BP 128/86 | HR 88 | Ht 64.0 in | Wt 135.0 lb

## 2019-10-18 DIAGNOSIS — M545 Low back pain, unspecified: Secondary | ICD-10-CM

## 2019-10-18 DIAGNOSIS — G8929 Other chronic pain: Secondary | ICD-10-CM

## 2019-10-18 DIAGNOSIS — M533 Sacrococcygeal disorders, not elsewhere classified: Secondary | ICD-10-CM | POA: Diagnosis not present

## 2019-10-18 MED ORDER — GABAPENTIN 100 MG PO CAPS: 100.0000 mg | ORAL_CAPSULE | Freq: Every evening | ORAL | 1 refills | Status: DC | PRN

## 2019-10-18 NOTE — Progress Notes (Signed)
   Rito Ehrlich, am serving as a Education administrator for Dr. Hulan Saas.  Julia [name] is a 79 y.o. female who presents to [facility] today for f/u of B hip pain, L>R.  She was last seen by [name] on 09/13/19 and was advised to con't PT of which she has completed 11 visits for a combination of hip pain and R shoulder/upper back pain.  She notes pain w/ bed mobility and difficulty w/ leg tremors.  Since her last visit, pt reports she feels better than before that the initial pain she had is gone but she is still experiencing stiffness. Patient states that laying down is still when the pain is at its  Worse and she will wake up to do something around the house just to get moving so the pain will subside. States she also cannot bend to much as the hip will get stiff and she will hve to walk around to make it better.  Major issue is that the low back pain/hip pain is interfering with sleep.  She occasionally takes 100 mg of gabapentin at bedtime.  Diagnostic testing: L-spine MRI- 04/13/19; L hip MRI- 03/28/19; L hip XR- 03/13/19  Pertinent review of systems: No fevers or chills  Relevant historical information: Lung fibrosis history of cancer Parkinson's disease   Exam:  BP 128/86 (BP Location: Left Arm, Patient Position: Sitting, Cuff Size: Normal)   Pulse 88   Ht 5\' 4"  (1.626 m)   Wt 135 lb (61.2 kg)   SpO2 97%   BMI 23.17 kg/m  General: Well Developed, well nourished, and in no acute distress.   MSK: L-spine nontender normal lumbar motion.  Tender palpation right SI joint. Normal hip motion.     Assessment and Plan: 79 y.o. female with right low back pain/SI joint pain.  Overall significantly improved with physical therapy.  Main issue now is that she is having pain that interferes with sleep.  Plan for gabapentin at bedtime in the hopes that this will allow her to sleep a bit better.  If not better next step is probably SI joint injection.  Patient would  like to avoid injection if possible.    Meds ordered this encounter  Medications  . gabapentin (NEURONTIN) 100 MG capsule    Sig: Take 1-3 capsules (100-300 mg total) by mouth at bedtime as needed (nerve or back pain).    Dispense:  90 capsule    Refill:  1     Discussed warning signs or symptoms. Please see discharge instructions. Patient expresses understanding.   The above documentation has been reviewed and is accurate and complete Lynne Leader, M.D.

## 2019-10-18 NOTE — Patient Instructions (Signed)
Thank you for coming in today. Increase the gabapentin to up to 300mg  at bedtime to help you sleep and reduce pain.  If this is not helpful or is annoying let me know. Next try would be lyrica.   Could do back injection at [facility] joint at anytime. Let me know.   Try toe spacers if needed.

## 2019-10-19 ENCOUNTER — Other Ambulatory Visit: Payer: Self-pay | Admitting: Family Medicine

## 2019-10-24 ENCOUNTER — Ambulatory Visit (INDEPENDENT_AMBULATORY_CARE_PROVIDER_SITE_OTHER): Payer: Medicare HMO

## 2019-10-24 ENCOUNTER — Other Ambulatory Visit: Payer: Self-pay

## 2019-10-24 DIAGNOSIS — J309 Allergic rhinitis, unspecified: Secondary | ICD-10-CM

## 2019-11-01 ENCOUNTER — Encounter: Payer: Self-pay | Admitting: Podiatry

## 2019-11-02 DIAGNOSIS — J3089 Other allergic rhinitis: Secondary | ICD-10-CM

## 2019-11-03 DIAGNOSIS — J302 Other seasonal allergic rhinitis: Secondary | ICD-10-CM

## 2019-11-07 ENCOUNTER — Ambulatory Visit (INDEPENDENT_AMBULATORY_CARE_PROVIDER_SITE_OTHER): Payer: Medicare HMO

## 2019-11-07 ENCOUNTER — Other Ambulatory Visit: Payer: Self-pay

## 2019-11-07 DIAGNOSIS — J309 Allergic rhinitis, unspecified: Secondary | ICD-10-CM

## 2019-11-13 ENCOUNTER — Other Ambulatory Visit: Payer: Self-pay

## 2019-11-13 ENCOUNTER — Encounter: Payer: Self-pay | Admitting: Neurology

## 2019-11-13 ENCOUNTER — Ambulatory Visit (INDEPENDENT_AMBULATORY_CARE_PROVIDER_SITE_OTHER): Payer: Medicare HMO | Admitting: Neurology

## 2019-11-13 VITALS — BP 112/68 | HR 89 | Ht 64.0 in | Wt 135.5 lb

## 2019-11-13 DIAGNOSIS — G4762 Sleep related leg cramps: Secondary | ICD-10-CM | POA: Diagnosis not present

## 2019-11-13 DIAGNOSIS — G2581 Restless legs syndrome: Secondary | ICD-10-CM

## 2019-11-13 DIAGNOSIS — G20 Parkinson's disease: Secondary | ICD-10-CM | POA: Diagnosis not present

## 2019-11-13 HISTORY — DX: Sleep related leg cramps: G47.62

## 2019-11-13 NOTE — Progress Notes (Signed)
Reason for visit: Parkinson's disease, peripheral neuropathy, nocturnal leg cramps  Julia [name] is an 79 y.o. female  History of present illness:  [name] is a 79 year old right-handed white female with a history of Parkinson's disease.  The patient has been remaining fairly active, she will walk 1 to 2 miles fairly frequently throughout the week.  She had 1 fall about 1 month ago when she tripped on a curb, she had one alcoholic beverage and was feeling the effects of it at the time of the fall.  She did not sustain any significant injury.  She continues to have some leg cramps at night that may occur several times a week.  She has not wanted to go on any medications for this.  She has gabapentin to take but does not take it on a regular basis because of side effects.  She has a peripheral neuropathy and has reported some numbness in the feet, she may have some mild discomfort in the feet at night.  She is on Prolia but this causes her muscles to ache.  She seems to be very sensitive to her medications.  She does report some emotional lability at times.  She has some anxiety and depression.  Past Medical History:  Diagnosis Date  . Arthritis   . Breast cancer ([place])   . Colon cancer ([place])    previous colon cancer  . Hyperlipidemia   . 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 RLS (restless legs syndrome) 07/14/2018  . 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 ADENOIDECTOMY    . BREAST LUMPECTOMY Left    radiation only  . BUNIONECTOMY    . COLON SURGERY    . HEMORRHOID SURGERY    . LAPAROSCOPY    . NASAL SINUS SURGERY    . OOPHORECTOMY    . TONSILLECTOMY    . TUBAL LIGATION      Family History  Problem Relation Age of Onset  . Arthritis Mother   . Diabetes Mother   . Heart disease Mother   . Hypertension Mother   . Arthritis Father   . Cancer Father   . Depression Father   . Osteoporosis Father    . Arthritis Sister   . Cancer Sister   . Diabetes Sister   . Cancer Brother   . Cancer Sister   . Cancer Sister   . Hyperlipidemia Sister   . Hyperlipidemia Brother     Social history:  reports that she has never smoked. She has never used smokeless tobacco. She reports current alcohol use. She reports that she does not use drugs.    Allergies  Allergen Reactions  . Cefdinir   . Cholestyramine     GERD  . Contrast Media [Iodinated Diagnostic Agents] Diarrhea  . Ezetimibe   . Meperidine   . Nitrofurantoin Diarrhea  . Statins   . Teriparatide     Medications:  Prior to Admission medications   Medication Sig Start Date End Date Taking? Authorizing Provider  azelastine (ASTELIN) 0.1 % nasal spray Place 1-2 sprays into both nostrils 2 (two) times daily. Use 1-2 sprays in each nostril twice a day as needed for drainage. Patient taking differently: Place 1-2 sprays into both nostrils as needed.  09/26/19  Yes Althea Charon, FNP  Calcium Citrate-Vitamin D 315-250 MG-UNIT TABS Take by mouth.   Yes [provider]  Carbidopa-Levodopa ER (SINEMET CR) 25-100 MG  tablet controlled release TAKE ONE TABLET BY MOUTH FOUR TIMES A DAY 08/07/19  Yes Kathrynn Ducking, MD  denosumab (PROLIA) 60 MG/ML SOSY injection Inject 60 mg into the skin every 6 (six) months.   Yes [provider]  EPINEPHRINE 0.3 mg/0.3 mL IJ SOAJ injection INJECT INTO THE MIDDLE OF THE OUTER THIGH AND HOLD FOR 3 SECONDS AS NEEDED FOR SEVERE ALLERGIC REACTION THEN CALL 911 IF USED. 02/14/19  Yes Bobbitt, Sedalia Muta, MD  Fexofenadine HCl (ALLEGRA ALLERGY PO) Take by mouth.   Yes [provider]  fluticasone (FLONASE) 50 MCG/ACT nasal spray Place 2 sprays into both nostrils daily as needed for allergies or rhinitis. 07/25/19  Yes Bobbitt, Sedalia Muta, MD  gabapentin (NEURONTIN) 100 MG capsule Take 1-3 capsules (100-300 mg total) by mouth at bedtime as needed (nerve or back pain). 10/18/19  Yes Gregor Hams, MD  levothyroxine (SYNTHROID) 88 MCG tablet TAKE ONE TABLET BY MOUTH DAILY BEFORE BREAKFAST 10/19/19  Yes Leamon Arnt, MD  Multiple Vitamins-Minerals (CENTRUM SILVER PO) Take by mouth.   Yes [provider]  NON [facility]  Creams-#11 Peripheral Neuropathy cream   Yes [provider]  Probiotic Product (PROBIOTIC DAILY PO) Take by mouth.   Yes [provider]  tamoxifen (NOLVADEX) 20 MG tablet Take 1 tablet (20 mg total) by mouth daily. 07/12/19  Yes Alla Feeling, NP  triamcinolone cream (KENALOG) 0.1 % Apply 1 application topically 2 (two) times daily. 03/22/19  Yes Inda Coke, PA  UNABLE TO FIND Med Name: Allergy shots 1/week   Yes [provider]  HYDROcodone-acetaminophen (NORCO/VICODIN) 5-325 MG tablet Take 1 tablet by mouth every 6 (six) hours as needed. Patient not taking: Reported on 11/13/2019 06/12/19   Gregor Hams, MD  traMADol (ULTRAM) 50 MG tablet Take 1 tablet (50 mg total) by mouth every 8 (eight) hours as needed for severe pain. Patient not taking: Reported on 11/13/2019 06/09/19   Gregor Hams, MD    ROS:  Out of a complete 14 system review of symptoms, the patient complains only of the following symptoms, and all other reviewed systems are negative.  Anxiety, depression Leg cramps  Blood pressure 112/68, pulse 89, height 5\' 4"  (1.626 m), weight 135 lb 8 oz (61.5 kg), SpO2 97 %.  Physical Exam  General: The patient is alert and cooperative at the time of the examination.  Skin: No significant peripheral edema is noted.   Neurologic Exam  Mental status: The patient is alert and oriented x 3 at the time of the examination. The patient has apparent normal recent and remote memory, with an apparently normal attention span and concentration ability.  Mini-Mental status examination done today shows a total score 30/30.   Cranial nerves: Facial symmetry is present. Speech is normal, no aphasia or  dysarthria is noted. Extraocular movements are full. Visual fields are full.  Minimal masking of the face is seen.  Motor: The patient has good strength in all 4 extremities.  Sensory examination: Soft touch sensation is symmetric on the face, arms, and legs.  Coordination: The patient has good finger-nose-finger and heel-to-shin bilaterally.  Gait and station: The patient has a normal gait.  She has relatively symmetric arm swing at this time, she is able to stand up from a seated position with arms crossed.  Tandem gait is normal. Romberg is negative. No drift is seen.  Reflexes: Deep tendon reflexes are symmetric.   Assessment/Plan:  1.  Parkinson's disease  2.  Mild memory disorder  3.  Peripheral neuropathy  4.  Nocturnal leg cramps  The patient will continue her current dose of Sinemet.  She does not wish to start another medication such as Cymbalta for her neuropathy and anxiety and depression.  She does not wish to have treatment for her nocturnal leg cramps.  She will continue to remain active, she will follow-up here in about 6 months.  Jill Alexanders MD 11/13/2019 11:31 AM  [facility] [address] [place], [name] [address]  Phone [phone] Fax [phone]

## 2019-11-21 ENCOUNTER — Other Ambulatory Visit: Payer: Self-pay

## 2019-11-21 ENCOUNTER — Ambulatory Visit (INDEPENDENT_AMBULATORY_CARE_PROVIDER_SITE_OTHER): Payer: Medicare HMO

## 2019-11-21 DIAGNOSIS — J309 Allergic rhinitis, unspecified: Secondary | ICD-10-CM

## 2019-12-05 ENCOUNTER — Ambulatory Visit (INDEPENDENT_AMBULATORY_CARE_PROVIDER_SITE_OTHER): Payer: Medicare HMO

## 2019-12-05 ENCOUNTER — Other Ambulatory Visit: Payer: Self-pay

## 2019-12-05 DIAGNOSIS — J309 Allergic rhinitis, unspecified: Secondary | ICD-10-CM | POA: Diagnosis not present

## 2019-12-10 ENCOUNTER — Other Ambulatory Visit: Payer: Self-pay | Admitting: Allergy and Immunology

## 2019-12-19 ENCOUNTER — Ambulatory Visit (INDEPENDENT_AMBULATORY_CARE_PROVIDER_SITE_OTHER): Payer: Medicare HMO

## 2019-12-19 ENCOUNTER — Other Ambulatory Visit: Payer: Self-pay

## 2019-12-19 DIAGNOSIS — J309 Allergic rhinitis, unspecified: Secondary | ICD-10-CM | POA: Diagnosis not present

## 2019-12-21 ENCOUNTER — Encounter: Payer: Self-pay | Admitting: *Deleted

## 2019-12-21 ENCOUNTER — Other Ambulatory Visit: Payer: Self-pay

## 2019-12-21 ENCOUNTER — Ambulatory Visit (INDEPENDENT_AMBULATORY_CARE_PROVIDER_SITE_OTHER): Payer: Medicare HMO | Admitting: *Deleted

## 2019-12-21 DIAGNOSIS — M810 Age-related osteoporosis without current pathological fracture: Secondary | ICD-10-CM

## 2019-12-21 MED ORDER — DENOSUMAB 60 MG/ML ~~LOC~~ SOSY
60.0000 mg | PREFILLED_SYRINGE | Freq: Once | SUBCUTANEOUS | Status: AC
  Administered 2019-12-21: 60 mg via SUBCUTANEOUS

## 2019-12-21 NOTE — Progress Notes (Signed)
Per orders of Dr. Jonni Sanger , injection of Prolia 60 mg/ml given by Anselmo Pickler, LPN  in right arm subcutaneous. Patient tolerated injection well. Patient will make appointment for 6 months.

## 2019-12-26 ENCOUNTER — Other Ambulatory Visit: Payer: Self-pay

## 2019-12-26 ENCOUNTER — Ambulatory Visit (INDEPENDENT_AMBULATORY_CARE_PROVIDER_SITE_OTHER): Payer: Medicare HMO

## 2019-12-26 DIAGNOSIS — J309 Allergic rhinitis, unspecified: Secondary | ICD-10-CM | POA: Diagnosis not present

## 2020-01-02 ENCOUNTER — Other Ambulatory Visit: Payer: Self-pay

## 2020-01-02 ENCOUNTER — Ambulatory Visit (INDEPENDENT_AMBULATORY_CARE_PROVIDER_SITE_OTHER): Payer: Medicare HMO

## 2020-01-02 DIAGNOSIS — J309 Allergic rhinitis, unspecified: Secondary | ICD-10-CM

## 2020-01-09 ENCOUNTER — Telehealth: Payer: Self-pay | Admitting: *Deleted

## 2020-01-09 NOTE — Telephone Encounter (Signed)
Pt called and stated, "I have a virus, (not COVID, was tested and was negative) and I'm not feeling well." Wanted to reschedule 10/14 appts with Dr.Feng. Scheduling message sent for rescheduling

## 2020-01-10 ENCOUNTER — Telehealth: Payer: Self-pay | Admitting: Hematology

## 2020-01-10 NOTE — Telephone Encounter (Signed)
Scheduled per 10/12 staff message. Pt is aware of appt times and date.

## 2020-01-11 ENCOUNTER — Inpatient Hospital Stay: Payer: Medicare HMO | Admitting: Hematology

## 2020-01-11 ENCOUNTER — Inpatient Hospital Stay: Payer: Medicare HMO

## 2020-01-12 ENCOUNTER — Encounter: Payer: Self-pay | Admitting: Family Medicine

## 2020-01-22 ENCOUNTER — Telehealth: Payer: Self-pay | Admitting: Hematology

## 2020-01-22 ENCOUNTER — Encounter: Payer: Self-pay | Admitting: Family Medicine

## 2020-01-22 NOTE — Telephone Encounter (Signed)
Rescheduled appointment per 10/25 inbasket msg. Spoke to patient who is aware of appointment date and time.

## 2020-01-23 ENCOUNTER — Ambulatory Visit (INDEPENDENT_AMBULATORY_CARE_PROVIDER_SITE_OTHER): Payer: Medicare HMO

## 2020-01-23 ENCOUNTER — Other Ambulatory Visit: Payer: Self-pay

## 2020-01-23 DIAGNOSIS — J309 Allergic rhinitis, unspecified: Secondary | ICD-10-CM | POA: Diagnosis not present

## 2020-01-26 ENCOUNTER — Ambulatory Visit: Payer: Medicare HMO | Admitting: Hematology

## 2020-01-26 ENCOUNTER — Other Ambulatory Visit: Payer: Medicare HMO

## 2020-01-30 ENCOUNTER — Other Ambulatory Visit: Payer: Self-pay

## 2020-01-30 ENCOUNTER — Ambulatory Visit (INDEPENDENT_AMBULATORY_CARE_PROVIDER_SITE_OTHER): Payer: Medicare HMO

## 2020-01-30 DIAGNOSIS — J309 Allergic rhinitis, unspecified: Secondary | ICD-10-CM

## 2020-02-05 ENCOUNTER — Ambulatory Visit (INDEPENDENT_AMBULATORY_CARE_PROVIDER_SITE_OTHER): Payer: Medicare HMO

## 2020-02-05 DIAGNOSIS — Z0000 Encounter for general adult medical examination without abnormal findings: Secondary | ICD-10-CM | POA: Diagnosis not present

## 2020-02-05 NOTE — Patient Instructions (Addendum)
Julia [name] , Thank you for taking time to come for your Medicare Wellness Visit. I appreciate your ongoing commitment to your health goals. Please review the following plan we discussed and let me know if I can assist you in the future.   Screening recommendations/referrals: Colonoscopy: No longer required Mammogram: Done 05/01/19 Bone Density: Done 04/04/18 Recommended yearly ophthalmology/optometry visit for glaucoma screening and checkup Recommended yearly dental visit for hygiene and checkup  Vaccinations: Influenza vaccine: Up to date Done 01/01/20 Pneumococcal vaccine: Up to date Shingles vaccine: Completed 7/19, 10/16, & 02/17/18   Covid-19:Completed 1/11 & 05/08/19  Advanced directives: Please bring a copy of your health care power of attorney and living will to the office at your convenience.  Conditions/risks identified: Be healthy  Next appointment: Follow up in one year for your annual wellness visit    Preventive Care 65 Years and Older, Female Preventive care refers to lifestyle choices and visits with your health care provider that can promote health and wellness. What does preventive care include?  A yearly physical exam. This is also called an annual well check.  Dental exams once or twice a year.  Routine eye exams. Ask your health care provider how often you should have your eyes checked.  Personal lifestyle choices, including:  Daily care of your teeth and gums.  Regular physical activity.  Eating a healthy diet.  Avoiding tobacco and drug use.  Limiting alcohol use.  Practicing safe sex.  Taking low-dose aspirin every day.  Taking vitamin and mineral supplements as recommended by your health care provider. What happens during an annual well check? The services and screenings done by your health care provider during your annual well check will depend on your age, overall health, lifestyle risk factors, and family history of disease. Counseling  Your  health care provider may ask you questions about your:  Alcohol use.  Tobacco use.  Drug use.  Emotional well-being.  Home and relationship well-being.  Sexual activity.  Eating habits.  History of falls.  Memory and ability to understand (cognition).  Work and work Statistician.  Reproductive health. Screening  You may have the following tests or measurements:  Height, weight, and BMI.  Blood pressure.  Lipid and cholesterol levels. These may be checked every 5 years, or more frequently if you are over 13 years old.  Skin check.  Lung cancer screening. You may have this screening every year starting at age 48 if you have a 30-pack-year history of smoking and currently smoke or have quit within the past 15 years.  Fecal occult blood test (FOBT) of the stool. You may have this test every year starting at age 50.  Flexible sigmoidoscopy or colonoscopy. You may have a sigmoidoscopy every 5 years or a colonoscopy every 10 years starting at age 23.  Hepatitis C blood test.  Hepatitis B blood test.  Sexually transmitted disease (STD) testing.  Diabetes screening. This is done by checking your blood sugar (glucose) after you have not eaten for a while (fasting). You may have this done every 1-3 years.  Bone density scan. This is done to screen for osteoporosis. You may have this done starting at age 54.  Mammogram. This may be done every 1-2 years. Talk to your health care provider about how often you should have regular mammograms. Talk with your health care provider about your test results, treatment options, and if necessary, the need for more tests. Vaccines  Your health care provider may recommend certain vaccines,  such as:  Influenza vaccine. This is recommended every year.  Tetanus, diphtheria, and acellular pertussis (Tdap, Td) vaccine. You may need a Td booster every 10 years.  Zoster vaccine. You may need this after age 37.  Pneumococcal 13-valent  conjugate (PCV13) vaccine. One dose is recommended after age 15.  Pneumococcal polysaccharide (PPSV23) vaccine. One dose is recommended after age 62. Talk to your health care provider about which screenings and vaccines you need and how often you need them. This information is not intended to replace advice given to you by your health care provider. Make sure you discuss any questions you have with your health care provider. Document Released: 04/12/2015 Document Revised: 12/04/2015 Document Reviewed: 01/15/2015 Elsevier Interactive Patient Education  2017 [place] Prevention in the Home Falls can cause injuries. They can happen to people of all ages. There are many things you can do to make your home safe and to help prevent falls. What can I do on the outside of my home?  Regularly fix the edges of walkways and driveways and fix any cracks.  Remove anything that might make you trip as you walk through a door, such as a raised step or threshold.  Trim any bushes or trees on the path to your home.  Use bright outdoor lighting.  Clear any walking paths of anything that might make someone trip, such as rocks or tools.  Regularly check to see if handrails are loose or broken. Make sure that both sides of any steps have handrails.  Any raised decks and porches should have guardrails on the edges.  Have any leaves, snow, or ice cleared regularly.  Use sand or salt on walking paths during winter.  Clean up any spills in your garage right away. This includes oil or grease spills. What can I do in the bathroom?  Use night lights.  Install grab bars by the toilet and in the tub and shower. Do not use towel bars as grab bars.  Use non-skid mats or decals in the tub or shower.  If you need to sit down in the shower, use a plastic, non-slip stool.  Keep the floor dry. Clean up any water that spills on the floor as soon as it happens.  Remove soap buildup in the tub or  shower regularly.  Attach bath mats securely with double-sided non-slip rug tape.  Do not have throw rugs and other things on the floor that can make you trip. What can I do in the bedroom?  Use night lights.  Make sure that you have a light by your bed that is easy to reach.  Do not use any sheets or blankets that are too big for your bed. They should not hang down onto the floor.  Have a firm chair that has side arms. You can use this for support while you get dressed.  Do not have throw rugs and other things on the floor that can make you trip. What can I do in the kitchen?  Clean up any spills right away.  Avoid walking on wet floors.  Keep items that you use a lot in easy-to-reach places.  If you need to reach something above you, use a strong step stool that has a grab bar.  Keep electrical cords out of the way.  Do not use floor polish or wax that makes floors slippery. If you must use wax, use non-skid floor wax.  Do not have throw rugs and other things on  the floor that can make you trip. What can I do with my stairs?  Do not leave any items on the stairs.  Make sure that there are handrails on both sides of the stairs and use them. Fix handrails that are broken or loose. Make sure that handrails are as long as the stairways.  Check any carpeting to make sure that it is firmly attached to the stairs. Fix any carpet that is loose or worn.  Avoid having throw rugs at the top or bottom of the stairs. If you do have throw rugs, attach them to the floor with carpet tape.  Make sure that you have a light switch at the top of the stairs and the bottom of the stairs. If you do not have them, ask someone to add them for you. What else can I do to help prevent falls?  Wear shoes that:  Do not have high heels.  Have rubber bottoms.  Are comfortable and fit you well.  Are closed at the toe. Do not wear sandals.  If you use a stepladder:  Make sure that it is fully  opened. Do not climb a closed stepladder.  Make sure that both sides of the stepladder are locked into place.  Ask someone to hold it for you, if possible.  Clearly mark and make sure that you can see:  Any grab bars or handrails.  First and last steps.  Where the edge of each step is.  Use tools that help you move around (mobility aids) if they are needed. These include:  Canes.  Walkers.  Scooters.  Crutches.  Turn on the lights when you go into a dark area. Replace any light bulbs as soon as they burn out.  Set up your furniture so you have a clear path. Avoid moving your furniture around.  If any of your floors are uneven, fix them.  If there are any pets around you, be aware of where they are.  Review your medicines with your doctor. Some medicines can make you feel dizzy. This can increase your chance of falling. Ask your doctor what other things that you can do to help prevent falls. This information is not intended to replace advice given to you by your health care provider. Make sure you discuss any questions you have with your health care provider. Document Released: 01/10/2009 Document Revised: 08/22/2015 Document Reviewed: 04/20/2014 Elsevier Interactive Patient Education  2017 [employer].

## 2020-02-05 NOTE — Progress Notes (Signed)
Virtual Visit via Telephone Note  I connected with  Julia [name] on 02/05/20 at  9:30 AM EST by telephone and verified that I am speaking with the correct person using two identifiers.  Medicare Annual Wellness visit completed telephonically due to Covid-19 pandemic.   Persons participating in this call: This Health Coach and this patient.   Location: Patient: Home Provider: Office   I discussed the limitations, risks, security and privacy concerns of performing an evaluation and management service by telephone and the availability of in person appointments. The patient expressed understanding and agreed to proceed.  Unable to perform video visit due to video visit attempted and failed and/or patient does not have video capability.   Some vital signs may be absent or patient reported.   Julia Brace, LPN    Subjective:   Julia [name] is a 79 y.o. female who presents for Medicare Annual (Subsequent) preventive examination.  Review of Systems     Cardiac Risk Factors include: dyslipidemia;advanced age (>69men, >52 women)     Objective:    There were no vitals filed for this visit. There is no height or weight on file to calculate BMI.  Advanced Directives 02/05/2020 08/03/2019 01/27/2019  Does Patient Have a Medical Advance Directive? Yes No Yes  Type of Advance Directive Living will;Healthcare Power of [place];Living will  Does patient want to make changes to medical advance directive? - - No - Patient declined  Copy of [place] in Chart? No - copy requested - No - copy requested  Would patient like information on creating a medical advance directive? - No - Patient declined -    Current Medications (verified) Outpatient Encounter Medications as of 02/05/2020  Medication Sig  . azelastine (ASTELIN) 0.1 % nasal spray Place 1-2 sprays into both nostrils 2 (two) times daily. Use 1-2 sprays in each nostril twice a day as  needed for drainage. (Patient taking differently: Place 1-2 sprays into both nostrils as needed. )  . BACLOFEN PO Take by mouth. As needed for leg cramps 5mg   . Calcium Citrate-Vitamin D 315-250 MG-UNIT TABS Take by mouth.  . Carbidopa-Levodopa ER (SINEMET CR) 25-100 MG tablet controlled release TAKE ONE TABLET BY MOUTH FOUR TIMES A DAY  . denosumab (PROLIA) 60 MG/ML SOSY injection Inject 60 mg into the skin every 6 (six) months.  . fluticasone (FLONASE) 50 MCG/ACT nasal spray PLACE 2 SPRAYS IN EACH NOSTRIL DAILY AS NEEDED FOR ALLERGIES OR RHINITIS  . gabapentin (NEURONTIN) 100 MG capsule Take 1-3 capsules (100-300 mg total) by mouth at bedtime as needed (nerve or back pain).  Marland Kitchen levothyroxine (SYNTHROID) 88 MCG tablet TAKE ONE TABLET BY MOUTH DAILY BEFORE BREAKFAST  . Multiple Vitamins-Minerals (CENTRUM SILVER PO) Take by mouth.  . NON FORMULARY [facility]  Creams-#11 Peripheral Neuropathy cream  . Probiotic Product (PROBIOTIC DAILY PO) Take by mouth.  . tamoxifen (NOLVADEX) 20 MG tablet Take 1 tablet (20 mg total) by mouth daily.  . traMADol (ULTRAM) 50 MG tablet Take 1 tablet (50 mg total) by mouth every 8 (eight) hours as needed for severe pain.  Marland Kitchen triamcinolone cream (KENALOG) 0.1 % Apply 1 application topically 2 (two) times daily.  Marland Kitchen UNABLE TO FIND Med Name: Allergy shots 1/week  . EPINEPHRINE 0.3 mg/0.3 mL IJ SOAJ injection INJECT INTO THE MIDDLE OF THE OUTER THIGH AND HOLD FOR 3 SECONDS AS NEEDED FOR SEVERE ALLERGIC REACTION THEN CALL 911 IF USED. (Patient not taking: Reported  on 02/05/2020)  . Fexofenadine HCl (ALLEGRA ALLERGY PO) Take by mouth. (Patient not taking: Reported on 02/05/2020)  . HYDROcodone-acetaminophen (NORCO/VICODIN) 5-325 MG tablet Take 1 tablet by mouth every 6 (six) hours as needed. (Patient not taking: Reported on 02/05/2020)   No facility-administered encounter medications on file as of 02/05/2020.    Allergies (verified) Cefdinir, Cholestyramine, Contrast  media [iodinated diagnostic agents], Ezetimibe, Meperidine, Nitrofurantoin, Statins, and Teriparatide   History: Past Medical History:  Diagnosis Date  . Arthritis   . Breast cancer ([place])   . Colon cancer ([place])    previous colon cancer  . Hyperlipidemia   . Nocturnal leg cramps 11/13/2019  . 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 RLS (restless legs syndrome) 07/14/2018  . 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 ADENOIDECTOMY    . BREAST LUMPECTOMY Left    radiation only  . BUNIONECTOMY    . COLON SURGERY    . HEMORRHOID SURGERY    . LAPAROSCOPY    . NASAL SINUS SURGERY    . OOPHORECTOMY    . TONSILLECTOMY    . TUBAL LIGATION     Family History  Problem Relation Age of Onset  . Arthritis Mother   . Diabetes Mother   . Heart disease Mother   . Hypertension Mother   . Arthritis Father   . Cancer Father   . Depression Father   . Osteoporosis Father   . Arthritis Sister   . Cancer Sister   . Diabetes Sister   . Cancer Brother   . Cancer Sister   . Cancer Sister   . Hyperlipidemia Sister   . Hyperlipidemia Brother    Social History   Socioeconomic History  . Marital status: Married    Spouse name: Not on file  . Number of children: Not on file  . Years of education: 3  . Highest education level: Master's degree (e.g., MA, MS, MEng, MEd, MSW, MBA)  Occupational History  . Occupation: Retired Pharmacist, hospital   Tobacco Use  . Smoking status: Never Smoker  . Smokeless tobacco: Never Used  Vaping Use  . Vaping Use: Never used  Substance and Sexual Activity  . Alcohol use: Yes    Comment: occ  . Drug use: Never  . Sexual activity: Not on file  Other Topics Concern  . Not on file  Social History Narrative   Right handed    Lives with husband    Caffeine 1 cup daily    Social Determinants of Health   Financial Resource Strain: Low Risk   . Difficulty of Paying Living Expenses: Not hard at  all  Food Insecurity: No Food Insecurity  . Worried About Charity fundraiser in the Last Year: Never true  . Ran Out of Food in the Last Year: Never true  Transportation Needs: No Transportation Needs  . Lack of Transportation (Medical): No  . Lack of Transportation (Non-Medical): No  Physical Activity: Insufficiently Active  . Days of Exercise per Week: 7 days  . Minutes of Exercise per Session: 20 min  Stress: No Stress Concern Present  . Feeling of Stress : Only a little  Social Connections: Moderately Integrated  . Frequency of Communication with Friends and Family: More than three times a week  . Frequency of Social Gatherings with Friends and Family: Once a week  . Attends Religious Services: More than 4 times per year  . Active Member  of Clubs or Organizations: No  . Attends Archivist Meetings: Never  . Marital Status: Married    Tobacco Counseling Counseling given: Not Answered   Clinical Intake:  Pre-visit preparation completed: Yes  Pain : No/denies pain     BMI - recorded: 23.26 Nutritional Status: BMI of 19-24  Normal Nutritional Risks: None Diabetes: No  How often do you need to have someone help you when you read instructions, pamphlets, or other written materials from your doctor or pharmacy?: 1 - Never  Diabetic?no  Interpreter Needed?: No  Information entered by :: Charlott Rakes, LPN   Activities of Daily Living In your present state of health, do you have any difficulty performing the following activities: 02/05/2020  Hearing? Y  Vision? N  Difficulty concentrating or making decisions? N  Walking or climbing stairs? N  Dressing or bathing? N  Doing errands, shopping? N  Preparing Food and eating ? N  Using the Toilet? N  In the past six months, have you accidently leaked urine? N  Do you have problems with loss of bowel control? Y  Comment slighty  Managing your Medications? N  Managing your Finances? N  Housekeeping or  managing your Housekeeping? N  Some recent data might be hidden    Patient Care Team: Leamon Arnt, MD as PCP - General (Family Medicine) Kathrynn Ducking, MD as Consulting Physician (Neurology) Bobbitt, [place] Muta, MD as Consulting Physician (Allergy and Immunology) Mansouraty, Telford Nab., MD as Consulting Physician (Gastroenterology) Truitt Merle, MD as Consulting Physician (Oncology) Lyndee Hensen, PT as Physical Therapist (Physical Therapy) Trula Slade, DPM as Consulting Physician (Podiatry)  Indicate any recent Medical Services you may have received from other than [facility] providers in the past year (date may be approximate).     Assessment:   This is a routine wellness examination for [facility].  Hearing/Vision screen  Hearing Screening   125Hz  250Hz  500Hz  1000Hz  2000Hz  3000Hz  4000Hz  6000Hz  8000Hz   Right ear:           Left ear:           Comments: Pt wears hearing aids  Vision Screening Comments: Pt follows up with Dr Laurance Flatten at [facility] annually  Dietary issues and exercise activities discussed: Current Exercise Habits: Home exercise routine, Type of exercise: walking, Time (Minutes): 20, Frequency (Times/Week): 7, Weekly Exercise (Minutes/Week): 140  Goals    . Patient Stated     Be healthy      Depression Screen PHQ 2/9 Scores 02/05/2020 01/27/2019 10/12/2018 01/10/2018  PHQ - 2 Score 0 0 0 0    Fall Risk Fall Risk  02/05/2020 01/27/2019 10/12/2018 06/15/2018 01/10/2018  Falls in the past year? 1 0 0 0 No  Number falls in past yr: 1 - 0 0 -  Injury with Fall? 1 0 0 0 -  Comment bruise on right knee - - - -  Risk for fall due to : Impaired vision - - - -  Follow up Falls prevention discussed Falls evaluation completed;Education provided;Falls prevention discussed Falls evaluation completed Falls evaluation completed -    Any stairs in or around the home? No  If so, are there any without handrails? Yes  Home free of loose throw rugs in  walkways, pet beds, electrical cords, etc? Yes  Adequate lighting in your home to reduce risk of falls? Yes   ASSISTIVE DEVICES UTILIZED TO PREVENT FALLS:  Life alert? No  Use of a cane, walker or  w/c? No  Grab bars in the bathroom? Yes  Shower chair or bench in shower? Yes  Elevated toilet seat or a handicapped toilet? Yes   TIMED UP AND GO:  Was the test performed? No .     Cognitive Function: MMSE - Mini Mental State Exam 11/13/2019 05/29/2019 12/28/2018  Orientation to time 5 5 5   Orientation to Place 5 5 5   Registration 3 3 3   Attention/ Calculation 5 2 5   Recall 3 3 3   Language- name 2 objects 2 2 2   Language- repeat 1 1 1   Language- follow 3 step command 3 3 3   Language- read & follow direction 1 1 1   Write a sentence 1 1 1   Copy design 1 1 1   Total score 30 27 30      6CIT Screen 01/27/2019  What Year? 0 points  What month? 0 points  What time? 0 points  Count back from 20 0 points  Months in reverse 0 points  Repeat phrase 0 points  Total Score 0    Immunizations Immunization History  Administered Date(s) Administered  . Fluad Quad(high Dose 65+) 12/16/2018  . Influenza, High Dose Seasonal PF 01/05/2015  . Influenza,inj,Quad PF,6+ Mos 01/10/2018  . Moderna SARS-COVID-2 Vaccination 04/10/2019, 05/08/2019  . Pneumococcal Conjugate-13 01/16/2014  . Pneumococcal Polysaccharide-23 12/10/2007, 01/29/2015  . Zoster Recombinat (Shingrix) 10/15/2017, 01/12/2018, 02/17/2018     Flu Vaccine status: Up to date Done 01/01/20  Pneumococcal vaccine status: Up to date   Covid-19 vaccine status: Completed vaccines  Qualifies for Shingles Vaccine? Yes   Zostavax completed Yes   Shingrix Completed?: Yes  Screening Tests Health Maintenance  Topic Date Due  . Hepatitis C Screening  Never done  . INFLUENZA VACCINE  10/29/2019  . DEXA SCAN  04/04/2020  . MAMMOGRAM  04/30/2020  . COVID-19 Vaccine  Completed  . PNA vac Low Risk Adult  Completed    Health  Maintenance  Health Maintenance Due  Topic Date Due  . Hepatitis C Screening  Never done  . INFLUENZA VACCINE  10/29/2019    Colorectal cancer screening: No longer required.  Mammogram status: Completed 05/01/19. Repeat every year Bone Density status: Completed 04/04/18. Results reflect: Bone density results: OSTEOPOROSIS. Repeat every 2 years.  Additional Screening:  Vision Screening: Recommended annual ophthalmology exams for early detection of glaucoma and other disorders of the eye. Is the patient up to date with their annual eye exam?  Yes  Who is the provider or what is the name of the office in which the patient attends annual eye exams? Dr Laurance Flatten    Dental Screening: Recommended annual dental exams for proper oral hygiene  Community Resource Referral / Chronic Care Management: CRR required this visit?  No   CCM required this visit?  No      Plan:     I have personally reviewed and noted the following in the patient's chart:   . Medical and social history . Use of alcohol, tobacco or illicit drugs  . Current medications and supplements . Functional ability and status . Nutritional status . Physical activity . Advanced directives . List of other physicians . Hospitalizations, surgeries, and ER visits in previous 12 months . Vitals . Screenings to include cognitive, depression, and falls . Referrals and appointments  In addition, I have reviewed and discussed with patient certain preventive protocols, quality metrics, and best practice recommendations. A written personalized care plan for preventive services as well as general preventive health recommendations were  provided to patient.     Julia Brace, LPN   16/06/3537   Nurse Notes: Pt is requesting an appt for concerns of SOB, Tiredness, and inability to sleep. Appt 02/07/20 @ 11:30. She is looking for an alternative to no medication and wanted to know  if her medications are contributing to her being tired.

## 2020-02-07 ENCOUNTER — Encounter: Payer: Self-pay | Admitting: Family Medicine

## 2020-02-07 ENCOUNTER — Ambulatory Visit (INDEPENDENT_AMBULATORY_CARE_PROVIDER_SITE_OTHER): Payer: Medicare HMO | Admitting: Family Medicine

## 2020-02-07 ENCOUNTER — Other Ambulatory Visit: Payer: Self-pay

## 2020-02-07 VITALS — BP 118/76 | HR 94 | Temp 97.6°F | Wt 132.4 lb

## 2020-02-07 DIAGNOSIS — G4762 Sleep related leg cramps: Secondary | ICD-10-CM

## 2020-02-07 DIAGNOSIS — G2581 Restless legs syndrome: Secondary | ICD-10-CM | POA: Diagnosis not present

## 2020-02-07 DIAGNOSIS — G620 Drug-induced polyneuropathy: Secondary | ICD-10-CM

## 2020-02-07 DIAGNOSIS — R5383 Other fatigue: Secondary | ICD-10-CM | POA: Diagnosis not present

## 2020-02-07 DIAGNOSIS — J8410 Pulmonary fibrosis, unspecified: Secondary | ICD-10-CM | POA: Diagnosis not present

## 2020-02-07 DIAGNOSIS — Z62810 Personal history of physical and sexual abuse in childhood: Secondary | ICD-10-CM

## 2020-02-07 DIAGNOSIS — G20 Parkinson's disease: Secondary | ICD-10-CM

## 2020-02-07 NOTE — Patient Instructions (Signed)
Please follow up as scheduled for your next visit with me: 05/01/2020   Please call [place] [facility] to schedule an appointment with Dr. Trey Paula; she is a therapist here at our [place] office.  The phone number is: [phone]  If you have any questions or concerns, please don't hesitate to send me a message via MyChart or call the office at [phone]. Thank you for visiting with [place] today! It's our pleasure caring for you.

## 2020-02-13 ENCOUNTER — Ambulatory Visit (INDEPENDENT_AMBULATORY_CARE_PROVIDER_SITE_OTHER): Payer: Medicare HMO

## 2020-02-13 ENCOUNTER — Other Ambulatory Visit: Payer: Self-pay

## 2020-02-13 DIAGNOSIS — J309 Allergic rhinitis, unspecified: Secondary | ICD-10-CM | POA: Diagnosis not present
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
## 2020-02-19 NOTE — Progress Notes (Signed)
[place]   [phone] [phone]   Clinic Follow up Note   Patient Care Team: Leamon Arnt, MD as PCP - General (Family Medicine) Kathrynn Ducking, MD as Consulting Physician (Neurology) Bobbitt, Sedalia Muta, MD as Consulting Physician (Allergy and Immunology) Mansouraty, Telford Nab., MD as Consulting Physician (Gastroenterology) Truitt Merle, MD as Consulting Physician (Oncology) Lyndee Hensen, PT as Physical Therapist (Physical Therapy) Trula Slade, DPM as Consulting Physician (Podiatry)  Date of Service:  02/26/2020  CHIEF COMPLAINT: F/u history of breast cancer and colon cancer   SUMMARY OF ONCOLOGIC HISTORY: 1. H/o Adenocarcinoma of the cecum in 12/2000  -s/p right hemicolectomy and 6 months of adjuvant 5FU-based chemotherapy.  -Reportedly had recurrence in the pelvis/ovary in 08/2002 and underwent TAH/BSO, omenectomy, LN dissection, and resection of peritoneal tumors  -additional 6 months of FOLFOX chemo  2. H/o breast cancer found on screening mammogram in 01/2015.  -Diagnostic mammo/US demonstrated a palpable 18 mm mass at the 2 o'clock position left breast, path showed invasive ductal carcinoma, Nottingham grade 2, reportedly ER+/PR-/HER2-.  -S/p left lumpectomy with SLN biopsy in 02/2015, path showed 2 cm IDC, grade 2, 2/5 SLN positive for carcinoma.  -S/p adjuvant radiation in 2017 -Began anti-estrogen therapy in early 2017. Did not tolerate AI. On tamoxifen currently.  -Genetic testing with Custom-Next cancer analysis panel including lynch syndrome and BRCA1/2 among others, which was negative.   CURRENT THERAPY:  Tamoxifen, starting early 2017. Plan to complete in 07/2020.   INTERVAL HISTORY:  Julia [name] is here for a follow up of breast cancer and colon cancer. She was last seen by our clinic 7 months ago. She presents to the clinic alone. She notes she is doing well. She is on Tamoxifen and tolerating well. She notes  Parkinson's medication or Tamoxifen is causes her significant fatigue, but feels better after taking her medications. She suspects this is from her Parkinson's medication. She notes her fatigue has been going on for some time but did not mention this to Dr Juleen China. She notes vaginal discharge is sticky which she attributes to Tamoxifen.  She also notes early satiety only with dinner and feels her stomach is pushing into her lungs. She is able to eat enough to maintain her weight. She notes her hip pain has been getting stronger in the past 6 months. She notes walking helps relieve this. She notes she still has neuropathy in her feet from chemo. She has numbness in her feet. She denies ever having urinary incontinence.    REVIEW OF SYSTEMS:   Constitutional: Denies fevers, chills or abnormal weight loss (+) Fatigue  Eyes: Denies blurriness of vision Ears, nose, mouth, throat, and face: Denies mucositis or sore throat Respiratory: Denies cough, dyspnea or wheezes Cardiovascular: Denies palpitation, chest discomfort or lower extremity swelling Gastrointestinal:  Denies nausea, heartburn or change in bowel habits (+) Early Satiety Skin: Denies abnormal skin rashes MSK(+) Intermittent hip pain  Lymphatics: Denies new lymphadenopathy or easy bruising Neurological: (+) Neuropathy in feet with numbness Behavioral/Psych: Mood is stable, no new changes  All other systems were reviewed with the patient and are negative.  MEDICAL HISTORY:  Past Medical History:  Diagnosis Date  . Arthritis   . Breast cancer ([place])   . Colon cancer ([place])    previous colon cancer  . Hyperlipidemia   . Nocturnal leg cramps 11/13/2019  . 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 RLS (restless legs syndrome) 07/14/2018  . 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 ADENOIDECTOMY    . BREAST LUMPECTOMY Left    radiation only  .  BUNIONECTOMY    . COLON SURGERY    . HEMORRHOID SURGERY    . LAPAROSCOPY    . NASAL SINUS SURGERY    . OOPHORECTOMY    . TONSILLECTOMY    . TUBAL LIGATION      I have reviewed the social history and family history with the patient and they are unchanged from previous note.  ALLERGIES:  is allergic to cefdinir, cholestyramine, contrast media [iodinated diagnostic agents], ezetimibe, meperidine, nitrofurantoin, statins, and teriparatide.  MEDICATIONS:  Current Outpatient Medications  Medication Sig Dispense Refill  . BACLOFEN PO Take by mouth. As needed for leg cramps 28m    . Calcium Citrate-Vitamin D 315-250 MG-UNIT TABS Take by mouth.    . Carbidopa-Levodopa ER (SINEMET CR) 25-100 MG tablet controlled release TAKE ONE TABLET BY MOUTH FOUR TIMES A DAY 360 tablet 1  . denosumab (PROLIA) 60 MG/ML SOSY injection Inject 60 mg into the skin every 6 (six) months.    . EPINEPHRINE 0.3 mg/0.3 mL IJ SOAJ injection INJECT INTO THE MIDDLE OF THE OUTER THIGH AND HOLD FOR 3 SECONDS AS NEEDED FOR SEVERE ALLERGIC REACTION THEN CALL 911 IF USED. 2 each 1  . fluticasone (FLONASE) 50 MCG/ACT nasal spray PLACE 2 SPRAYS IN EACH NOSTRIL DAILY AS NEEDED FOR ALLERGIES OR RHINITIS 16 g 5  . gabapentin (NEURONTIN) 100 MG capsule Take 1-3 capsules (100-300 mg total) by mouth at bedtime as needed (nerve or back pain). (Patient not taking: Reported on 02/07/2020) 90 capsule 1  . levothyroxine (SYNTHROID) 88 MCG tablet TAKE ONE TABLET BY MOUTH DAILY BEFORE BREAKFAST 90 tablet 1  . Melatonin 1 MG CAPS Take by mouth as needed.    . Multiple Vitamins-Minerals (CENTRUM SILVER PO) Take by mouth.    . NON FORMULARY [facility]  Creams-#11 Peripheral Neuropathy cream    . Probiotic Product (PROBIOTIC DAILY PO) Take by mouth.    . tamoxifen (NOLVADEX) 20 MG tablet Take 1 tablet (20 mg total) by mouth daily. 30 tablet 6  . UNABLE TO FIND Med Name: Allergy shots 1/week     No current facility-administered  medications for this visit.    PHYSICAL EXAMINATION: ECOG PERFORMANCE STATUS: 1 - Symptomatic but completely ambulatory  Vitals:   02/26/20 1301  BP: 133/77  Pulse: 95  Resp: 13  Temp: 98.2 F (36.8 C)  SpO2: 99%   Filed Weights   02/26/20 1301  Weight: 133 lb (60.3 kg)    GENERAL:alert, no distress and comfortable SKIN: skin color, texture, turgor are normal (+) Diffuse skin erythema of upper chest (+) Multiple (more than 10) large moles of back, largest which is mildly uneven.  EYES: normal, Conjunctiva are pink and non-injected, sclera clear  NECK: supple, thyroid normal size, non-tender, without nodularity LYMPH:  no palpable lymphadenopathy in the cervical, axillary  LUNGS: clear to auscultation and percussion with normal breathing effort HEART: regular rate & rhythm and no murmurs and no lower extremity edema ABDOMEN:abdomen soft, non-tender and normal bowel sounds Musculoskeletal:no cyanosis of digits and no clubbing  NEURO: alert & oriented x 3 with fluent speech, no focal motor/sensory deficits BREAST: S/p left lumpectomy: Surgical incision healed well with mild scar tissue. No palpable mass, nodules or adenopathy bilaterally. Breast exam benign.   LABORATORY DATA:  I have reviewed  the data as listed CBC Latest Ref Rng & Units 02/26/2020 10/17/2019 07/12/2019  WBC 4.0 - 10.5 K/uL 7.0 7.2 7.4  Hemoglobin 12.0 - 15.0 g/dL 13.2 13.4 13.6  Hematocrit 36 - 46 % 39.7 39.6 42.2  Platelets 150 - 400 K/uL 212 207.0 230     CMP Latest Ref Rng & Units 02/26/2020 10/17/2019 07/12/2019  Glucose 70 - 99 mg/dL 198(H) 89 133(H)  BUN 8 - 23 mg/dL 19 22 22   Creatinine 0.44 - 1.00 mg/dL 1.09(H) 0.95 1.08(H)  Sodium 135 - 145 mmol/L 138 137 140  Potassium 3.5 - 5.1 mmol/L 3.9 4.1 4.2  Chloride 98 - 111 mmol/L 102 100 103  CO2 22 - 32 mmol/L 27 30 30   Calcium 8.9 - 10.3 mg/dL 9.4 9.5 9.9  Total Protein 6.5 - 8.1 g/dL 6.7 6.3 6.8  Total Bilirubin 0.3 - 1.2 mg/dL 0.4 0.4 0.3   Alkaline Phos 38 - 126 U/L 37(L) 36(L) 44  AST 15 - 41 U/L 17 14 16   ALT 0 - 44 U/L 9 4 8       RADIOGRAPHIC STUDIES: I have personally reviewed the radiological images as listed and agreed with the findings in the report. No results found.   ASSESSMENT & PLAN:  Lauraine Crespo is a 79 y.o. female with    1. 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 She was reportedly intolerant to AI and is now on tamoxifen.  -From a breast cancer standpoint, She is clinically doing well. Lab reviewed, her CBC and CMP are within normal limits. Her physical exam shows diffuse skin rash of upper chest and benign breast exam. I recommend she use OTC Hydrocortisone on her rash. Her 05/2019 mammogram were unremarkable. There is no clinical concern for recurrence. -Continue surveillance. Next Mammogram in 04/2020.  -Continue Tamoxifen. Will try to get her oncology records from her oncologist at [facility] determine if she needs extended antiestrogen therapy.she will complete 5 years Tamoxifen in 07/2020. -F/u in 1 year.   2. 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 She is on Gabapentin 111m. I discussed she can increase slowly up to 303mTID if needed.  -Last colonoscopy in 2018. Continue to F/u with Dr. MaRush Landmark 3. Left hip pain, arthritis  -Gradual onset around Oct 2020, questionably after she began prolia injections  -04/13/19 Lumbar MRI showed degenerative disease. Bone scan on 04/13/19 was negative for osseous  metastasis, did show incidental uptake in 8th rib which coincides with minimally displaced fracture of the posterolateral aspect of the right eighth rib on CT chest from 06/16/19  -Pain has worsened in the past 6 months. Given improvement with exercise, this is likely from Osteoarthritis.   -I recommend she remain active with exercise.   4. Genetic testing from 01/2015 was negative for pathogenetic mutations.   5. Osteoporosis  -T-score is -2.7 at [facility] in 2018, and worsened to -3.9 at [facility] in 2020. Plan to repeat in 04/2020.  -on Prolia and cal+vit D per PCP. I encouraged light weight bearing exercises. She also  on Tamoxifen -she does not like Prolia, I recommend for her to discuss with Dr. Jonni Sanger. May consider zometa if she comes off Prolia   6. Vaginal prolapse likely secondary to abdominal surgeries, Vaginal Discharge -She notes vaginal discharge being sticky. I discussed this is likely from Tamoxifen. Manageable currently.   7. Chronic conditions - parkinson's diease, thyroid disease  -Followed by neurology, PCP -She notes recent significant fatigue right before taking her medication. I recommend she f/u with Dr Juleen China about this.   8. Early Satiety -She notes early satiety only with dinner and feels her stomach is pushing into her lungs. She still has appetite. She is able to eat enough to maintain her weight. Will monitor.  -I suggest smaller more frequent meals and remaining active.   9. Skin moles  -She has multiple large moles of her bag, largest is uneven. I recommend she see her PCP and may require dermatologist referral.    PLAN: -Mammogram and DEXA in 04/2020.  -Continue Tamoxifen. Plan to complete in 07/2020 -Lab and F/u in 1 year.  -Will request her oncology records again, and decide if she needs Tamoxifen beyond 5 years    No problem-specific Assessment & Plan notes found for this encounter.   Orders Placed This Encounter  Procedures  . MM Digital  Screening    Standing Status:   Future    Standing Expiration Date:   02/25/2021    Order Specific Question:   Reason for Exam (SYMPTOM  OR DIAGNOSIS REQUIRED)    Answer:   screening    Order Specific Question:   Preferred imaging location?    Answer:   [facility]  . DG Bone Density    Standing Status:   Future    Standing Expiration Date:   02/25/2021    Order Specific Question:   Reason for Exam (SYMPTOM  OR DIAGNOSIS REQUIRED)    Answer:   screening    Order Specific Question:   Preferred imaging location?    Answer:   [facility]   All questions were answered. The patient knows to call the clinic with any problems, questions or concerns. No barriers to learning was detected. The total time spent in the appointment was 30 minutes.     Truitt Merle, MD 02/26/2020   I, Joslyn Devon, am acting as scribe for Truitt Merle, MD.   I have reviewed the above documentation for accuracy and completeness, and I agree with the above.

## 2020-02-26 ENCOUNTER — Encounter: Payer: Self-pay | Admitting: Hematology

## 2020-02-26 ENCOUNTER — Other Ambulatory Visit: Payer: Self-pay

## 2020-02-26 ENCOUNTER — Inpatient Hospital Stay: Payer: Medicare HMO | Attending: Hematology

## 2020-02-26 ENCOUNTER — Inpatient Hospital Stay: Payer: Medicare HMO | Admitting: Hematology

## 2020-02-26 VITALS — BP 133/77 | HR 95 | Temp 98.2°F | Resp 13 | Ht 64.0 in | Wt 133.0 lb

## 2020-02-26 DIAGNOSIS — E2839 Other primary ovarian failure: Secondary | ICD-10-CM

## 2020-02-26 DIAGNOSIS — Z79810 Long term (current) use of selective estrogen receptor modulators (SERMs): Secondary | ICD-10-CM | POA: Diagnosis not present

## 2020-02-26 DIAGNOSIS — C50412 Malignant neoplasm of upper-outer quadrant of left female breast: Secondary | ICD-10-CM | POA: Diagnosis present

## 2020-02-26 DIAGNOSIS — E079 Disorder of thyroid, unspecified: Secondary | ICD-10-CM | POA: Insufficient documentation

## 2020-02-26 DIAGNOSIS — G20 Parkinson's disease: Secondary | ICD-10-CM | POA: Diagnosis not present

## 2020-02-26 DIAGNOSIS — R6881 Early satiety: Secondary | ICD-10-CM | POA: Insufficient documentation

## 2020-02-26 DIAGNOSIS — Z170 Estrogen receptor positive status [ER+]: Secondary | ICD-10-CM | POA: Insufficient documentation

## 2020-02-26 DIAGNOSIS — Z1231 Encounter for screening mammogram for malignant neoplasm of breast: Secondary | ICD-10-CM

## 2020-02-26 DIAGNOSIS — Z79899 Other long term (current) drug therapy: Secondary | ICD-10-CM | POA: Diagnosis not present

## 2020-02-26 DIAGNOSIS — Z85038 Personal history of other malignant neoplasm of large intestine: Secondary | ICD-10-CM | POA: Insufficient documentation

## 2020-02-26 DIAGNOSIS — Z923 Personal history of irradiation: Secondary | ICD-10-CM | POA: Diagnosis not present

## 2020-02-26 DIAGNOSIS — C50919 Malignant neoplasm of unspecified site of unspecified female breast: Secondary | ICD-10-CM

## 2020-02-26 LAB — CMP (CANCER CENTER ONLY)
ALT: 9 U/L (ref 0–44)
AST: 17 U/L (ref 15–41)
Albumin: 3.8 g/dL (ref 3.5–5.0)
Alkaline Phosphatase: 37 U/L — ABNORMAL LOW (ref 38–126)
Anion gap: 9 (ref 5–15)
BUN: 19 mg/dL (ref 8–23)
CO2: 27 mmol/L (ref 22–32)
Calcium: 9.4 mg/dL (ref 8.9–10.3)
Chloride: 102 mmol/L (ref 98–111)
Creatinine: 1.09 mg/dL — ABNORMAL HIGH (ref 0.44–1.00)
GFR, Estimated: 52 mL/min — ABNORMAL LOW (ref >60)
Glucose, Bld: 198 mg/dL — ABNORMAL HIGH (ref 70–99)
Potassium: 3.9 mmol/L (ref 3.5–5.1)
Sodium: 138 mmol/L (ref 135–145)
Total Bilirubin: 0.4 mg/dL (ref 0.3–1.2)
Total Protein: 6.7 g/dL (ref 6.5–8.1)

## 2020-02-26 LAB — CBC WITH DIFFERENTIAL (CANCER CENTER ONLY)
Abs Immature Granulocytes: 0.02 10*3/uL (ref 0.00–0.07)
Basophils Absolute: 0.1 10*3/uL (ref 0.0–0.1)
Basophils Relative: 1 %
Eosinophils Absolute: 0.1 10*3/uL (ref 0.0–0.5)
Eosinophils Relative: 2 %
HCT: 39.7 % (ref 36.0–46.0)
Hemoglobin: 13.2 g/dL (ref 12.0–15.0)
Immature Granulocytes: 0 %
Lymphocytes Relative: 16 %
Lymphs Abs: 1.1 10*3/uL (ref 0.7–4.0)
MCH: 30.6 pg (ref 26.0–34.0)
MCHC: 33.2 g/dL (ref 30.0–36.0)
MCV: 91.9 fL (ref 80.0–100.0)
Monocytes Absolute: 0.5 10*3/uL (ref 0.1–1.0)
Monocytes Relative: 7 %
Neutro Abs: 5.2 10*3/uL (ref 1.7–7.7)
Neutrophils Relative %: 74 %
Platelet Count: 212 10*3/uL (ref 150–400)
RBC: 4.32 MIL/uL (ref 3.87–5.11)
RDW: 13.8 % (ref 11.5–15.5)
WBC Count: 7 10*3/uL (ref 4.0–10.5)
nRBC: 0 % (ref 0.0–0.2)

## 2020-02-26 NOTE — Progress Notes (Signed)
Records requests faxed to [facility], [phone] and [facility], [phone]

## 2020-02-27 ENCOUNTER — Telehealth: Payer: Self-pay | Admitting: Hematology

## 2020-02-27 ENCOUNTER — Ambulatory Visit (INDEPENDENT_AMBULATORY_CARE_PROVIDER_SITE_OTHER): Payer: Medicare HMO

## 2020-02-27 DIAGNOSIS — J309 Allergic rhinitis, unspecified: Secondary | ICD-10-CM | POA: Diagnosis not present

## 2020-02-27 NOTE — Telephone Encounter (Signed)
Scheduled appts per 11/29 los. Was not able to leave voicemail. Mailed appt reminder and calendar.

## 2020-03-01 ENCOUNTER — Telehealth: Payer: Self-pay

## 2020-03-01 NOTE — Telephone Encounter (Signed)
Please advise 

## 2020-03-01 NOTE — Telephone Encounter (Signed)
Pt is looking for a dermatologist reccomendation

## 2020-03-12 ENCOUNTER — Other Ambulatory Visit: Payer: Self-pay

## 2020-03-12 ENCOUNTER — Encounter: Payer: Self-pay | Admitting: Family Medicine

## 2020-03-12 ENCOUNTER — Ambulatory Visit (INDEPENDENT_AMBULATORY_CARE_PROVIDER_SITE_OTHER): Payer: Medicare HMO

## 2020-03-12 DIAGNOSIS — J309 Allergic rhinitis, unspecified: Secondary | ICD-10-CM | POA: Diagnosis not present

## 2020-03-12 NOTE — Telephone Encounter (Signed)
LMOVM advising patient that I have sent a MyChart message with PCP recommendations

## 2020-03-12 NOTE — Telephone Encounter (Signed)
Camillo Flaming, MD or [facility] Or [facility] dermatology center

## 2020-03-13 DIAGNOSIS — J3089 Other allergic rhinitis: Secondary | ICD-10-CM

## 2020-03-13 NOTE — Progress Notes (Signed)
Vials exp 03-13-21

## 2020-03-14 DIAGNOSIS — J302 Other seasonal allergic rhinitis: Secondary | ICD-10-CM

## 2020-03-26 ENCOUNTER — Ambulatory Visit (INDEPENDENT_AMBULATORY_CARE_PROVIDER_SITE_OTHER): Payer: Medicare HMO

## 2020-03-26 DIAGNOSIS — J309 Allergic rhinitis, unspecified: Secondary | ICD-10-CM

## 2020-04-09 ENCOUNTER — Ambulatory Visit (INDEPENDENT_AMBULATORY_CARE_PROVIDER_SITE_OTHER): Payer: Medicare HMO

## 2020-04-09 ENCOUNTER — Other Ambulatory Visit: Payer: Self-pay

## 2020-04-09 DIAGNOSIS — J309 Allergic rhinitis, unspecified: Secondary | ICD-10-CM

## 2020-04-12 ENCOUNTER — Other Ambulatory Visit: Payer: Self-pay | Admitting: Family Medicine

## 2020-04-23 ENCOUNTER — Ambulatory Visit (INDEPENDENT_AMBULATORY_CARE_PROVIDER_SITE_OTHER): Payer: Medicare HMO

## 2020-04-23 ENCOUNTER — Other Ambulatory Visit: Payer: Self-pay

## 2020-04-23 DIAGNOSIS — J309 Allergic rhinitis, unspecified: Secondary | ICD-10-CM

## 2020-05-01 ENCOUNTER — Encounter: Payer: Self-pay | Admitting: Family Medicine

## 2020-05-01 ENCOUNTER — Ambulatory Visit: Payer: Medicare HMO | Admitting: Family Medicine

## 2020-05-01 ENCOUNTER — Telehealth: Payer: Self-pay

## 2020-05-01 ENCOUNTER — Other Ambulatory Visit: Payer: Self-pay

## 2020-05-01 VITALS — BP 128/88 | HR 95 | Temp 98.4°F | Wt 130.2 lb

## 2020-05-01 DIAGNOSIS — R918 Other nonspecific abnormal finding of lung field: Secondary | ICD-10-CM | POA: Diagnosis not present

## 2020-05-01 DIAGNOSIS — R7303 Prediabetes: Secondary | ICD-10-CM

## 2020-05-01 LAB — POCT GLYCOSYLATED HEMOGLOBIN (HGB A1C): Hemoglobin A1C: 5.7 % — AB (ref 4.0–5.6)

## 2020-05-01 NOTE — Telephone Encounter (Signed)
Julia [name] is calling in from [facility], requesting for the CT CHEST order to be changed to CT CHEST W/O contrast.

## 2020-05-01 NOTE — Progress Notes (Signed)
Subjective  CC:  Chief Complaint  Patient presents with  . Prediabetes    HPI: Julia [name] is a 80 y.o. female who presents to the office today to address the problems listed above in the chief complaint.  F/u prediabetes: has cut way back on chocolates. No sxs of hyperglycemia.   Pulmonary nodules notes on ct chest 05/2019: due to HR status due to multiple cancers in past, rec recheck now. She denies cough, sob or hemoptysis.   Would like to clarify statements made about her past history and wants it corrected in my last note.    Assessment  1. Prediabetes   2. Multiple pulmonary nodules      Plan   prediabetes:  Improved with diet changes. Praised. Continue. Recheck 6 months.   CT scan to recheck nodules. Education given.   Counseling and education and clarification regarding her past history and documentation and confidentiality of medical records. Total time of today's visit was 30 minutes.   Follow up: 6 months for cpe  Visit date not found  Orders Placed This Encounter  Procedures  . CT CHEST NODULE FOLLOW UP LOW DOSE W/O  . POCT HgB A1C   No orders of the defined types were placed in this encounter.     I reviewed the patients updated PMH, FH, and SocHx.    Patient Active Problem List   Diagnosis Date Noted  . Statin intolerance 10/13/2018    Priority: High  . Parkinson's disease ([place]) 09/29/2017    Priority: High  . Age-related osteoporosis without current pathological fracture 07/20/2016    Priority: High  . Mixed hyperlipidemia 03/07/2015    Priority: High  . Impaired fasting glucose 03/07/2015    Priority: High  . History of colon cancer 02/13/2015    Priority: High  . Hypothyroidism due to Hashimoto's thyroiditis 02/13/2015    Priority: High  . History of breast cancer left, 2016 02/13/2015    Priority: High  . Multiple pulmonary nodules 06/20/2019    Priority: Medium  . RLS (restless legs syndrome) 07/14/2018    Priority: Medium  . REM  sleep behavior disorder 07/14/2018    Priority: Medium  . Anxiety 09/29/2017    Priority: Medium  . Dysfunctional voiding of urine 09/13/2016    Priority: Medium  . Female stress incontinence 09/13/2016    Priority: Medium  . 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 Gastroesophageal reflux disease 09/13/2016    Priority: Medium  . Drug-induced polyneuropathy ([place]) 03/07/2015    Priority: Medium  . Irritable bowel syndrome with both constipation and diarrhea 02/13/2015    Priority: Medium  . 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 Perennial and seasonal allergic rhinitis 02/01/2018    Priority: Low  . Atrophic vaginitis 02/13/2015    Priority: Low  . Recurrent UTI 02/13/2015    Priority: Low  . Vitamin D deficiency disease 02/13/2015    Priority: Low  . Nocturnal leg cramps 11/13/2019  . Pelvic floor dysfunction 08/26/2018  . Female bladder prolapse 04/07/2018  . History of nasal polyp 02/01/2018   Current Meds  Medication Sig  . Calcium Citrate-Vitamin D 315-250 MG-UNIT TABS Take by mouth.  . Carbidopa-Levodopa ER (SINEMET CR) 25-100 MG tablet controlled release TAKE ONE TABLET BY MOUTH FOUR TIMES A DAY (Patient taking differently: 3 (three) times daily.)  . denosumab (PROLIA) 60 MG/ML SOSY injection Inject 60 mg into the skin every  6 (six) months.  . EPINEPHRINE 0.3 mg/0.3 mL IJ SOAJ injection INJECT INTO THE MIDDLE OF THE OUTER THIGH AND HOLD FOR 3 SECONDS AS NEEDED FOR SEVERE ALLERGIC REACTION THEN CALL 911 IF USED.  . fluticasone (FLONASE) 50 MCG/ACT nasal spray PLACE 2 SPRAYS IN EACH NOSTRIL DAILY AS NEEDED FOR ALLERGIES OR RHINITIS  . gabapentin (NEURONTIN) 100 MG capsule Take 1-3 capsules (100-300 mg total) by mouth at bedtime as needed (nerve or back pain).  Marland Kitchen levothyroxine (SYNTHROID) 88 MCG tablet TAKE ONE TABLET BY MOUTH DAILY FOR BREAKFAST  . Melatonin 1 MG CAPS Take by mouth as needed.  . Multiple  Vitamins-Minerals (CENTRUM SILVER PO) Take by mouth.  . NON FORMULARY [facility]  Creams-#11 Peripheral Neuropathy cream  . Probiotic Product (PROBIOTIC DAILY PO) Take by mouth.  . tamoxifen (NOLVADEX) 20 MG tablet Take 1 tablet (20 mg total) by mouth daily.  Marland Kitchen UNABLE TO FIND Med Name: Allergy shots 1/week    Allergies: Patient is allergic to cefdinir, cholestyramine, contrast media [iodinated diagnostic agents], ezetimibe, meperidine, nitrofurantoin, statins, and teriparatide. Family History: Patient family history includes Arthritis in her father, mother, and sister; Cancer in her brother, father, sister, sister, and sister; Depression in her father; Diabetes in her mother and sister; Heart disease in her mother; Hyperlipidemia in her brother and sister; Hypertension in her mother; Osteoporosis in her father. Social History:  Patient  reports that she has never smoked. She has never used smokeless tobacco. She reports current alcohol use. She reports that she does not use drugs.  Review of Systems: Constitutional: Negative for fever malaise or anorexia Cardiovascular: negative for chest pain Respiratory: negative for SOB or persistent cough Gastrointestinal: negative for abdominal pain  Objective  Vitals: BP 128/88   Pulse 95   Temp 98.4 F (36.9 C) (Temporal)   Wt 130 lb 3.2 oz (59.1 kg)   SpO2 97%   BMI 22.35 kg/m  General: no acute distress , A&Ox3   Commons side effects, risks, benefits, and alternatives for medications and treatment plan prescribed today were discussed, and the patient expressed understanding of the given instructions. Patient is instructed to call or message via MyChart if he/she has any questions or concerns regarding our treatment plan. No barriers to understanding were identified. We discussed Red Flag symptoms and signs in detail. Patient expressed understanding regarding what to do in case of urgent or emergency type symptoms.   Medication  list was reconciled, printed and provided to the patient in AVS. Patient instructions and summary information was reviewed with the patient as documented in the AVS. This note was prepared with assistance of Dragon voice recognition software. Occasional wrong-word or sound-a-like substitutions may have occurred due to the inherent limitations of voice recognition software  This visit occurred during the SARS-CoV-2 public health emergency.  Safety protocols were in place, including screening questions prior to the visit, additional usage of staff PPE, and extensive cleaning of exam room while observing appropriate contact time as indicated for disinfecting solutions.

## 2020-05-01 NOTE — Patient Instructions (Signed)
Please return in July 2022 for your annual complete physical; please come fasting.  We will call you to get you scheduled for a chest CT to check the nodules and ensure they look fine.   If you have any questions or concerns, please don't hesitate to send me a message via MyChart or call the office at [phone]. Thank you for visiting with [place] today! It's our pleasure caring for you.

## 2020-05-02 NOTE — Telephone Encounter (Signed)
New order has been placed. 

## 2020-05-02 NOTE — Addendum Note (Signed)
Addended by: Jodell Cipro on: 05/02/2020 09:59 AM   Modules accepted: Orders

## 2020-05-07 ENCOUNTER — Ambulatory Visit (INDEPENDENT_AMBULATORY_CARE_PROVIDER_SITE_OTHER): Payer: Medicare HMO

## 2020-05-07 ENCOUNTER — Other Ambulatory Visit: Payer: Self-pay

## 2020-05-07 DIAGNOSIS — J309 Allergic rhinitis, unspecified: Secondary | ICD-10-CM | POA: Diagnosis not present

## 2020-05-14 ENCOUNTER — Other Ambulatory Visit: Payer: Self-pay

## 2020-05-14 ENCOUNTER — Ambulatory Visit (INDEPENDENT_AMBULATORY_CARE_PROVIDER_SITE_OTHER): Payer: Medicare HMO

## 2020-05-14 DIAGNOSIS — J309 Allergic rhinitis, unspecified: Secondary | ICD-10-CM

## 2020-05-16 ENCOUNTER — Encounter: Payer: Self-pay | Admitting: Neurology

## 2020-05-16 ENCOUNTER — Ambulatory Visit: Payer: Medicare HMO | Admitting: Neurology

## 2020-05-16 VITALS — Ht 64.0 in | Wt 133.4 lb

## 2020-05-16 DIAGNOSIS — G20 Parkinson's disease: Secondary | ICD-10-CM

## 2020-05-16 DIAGNOSIS — G4752 REM sleep behavior disorder: Secondary | ICD-10-CM | POA: Diagnosis not present

## 2020-05-16 DIAGNOSIS — G2581 Restless legs syndrome: Secondary | ICD-10-CM | POA: Diagnosis not present

## 2020-05-16 MED ORDER — CARBIDOPA-LEVODOPA ER 25-100 MG PO TBCR
1.0000 | EXTENDED_RELEASE_TABLET | Freq: Four times a day (QID) | ORAL | 1 refills | Status: DC
Start: 2020-05-16 — End: 2020-06-13

## 2020-05-16 MED ORDER — PRAMIPEXOLE DIHYDROCHLORIDE 0.25 MG PO TABS: 0.2500 mg | ORAL_TABLET | Freq: Every day | ORAL | 1 refills | Status: DC

## 2020-05-16 NOTE — Progress Notes (Signed)
Reason for visit: Parkinson's disease  Julia [name] is an 80 y.o. female  History of present illness:  Julia [name] is a 80 year old right-handed white female with a history of Parkinson's disease.  She recently has been evaluated for low back pain that is associated with sciatica down the left leg.  She feels better with standing but she has trouble sitting for long periods of time.  The patient continues to have numbness and cold sensations in her feet from her peripheral neuropathy, she also has leg cramps at night.  She may occasionally have abdominal cramps.  She has been on gabapentin but could not tolerate it as it resulted in too much daytime drowsiness even at 100 mg nightly dose.  The patient has some constipation issues as well.  She has had some problems with the wearing off effect of the Sinemet, she will get unusual sensations in the hands and feet, and curling of the toes.  The Sinemet seems to last about 4-1/2 hours before it seems to wear off.  Past Medical History:  Diagnosis Date  . Arthritis   . Breast cancer ([place])   . Colon cancer ([place])    previous colon cancer  . Hyperlipidemia   . Nocturnal leg cramps 11/13/2019  . 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 RLS (restless legs syndrome) 07/14/2018  . Secondary malignant neoplasm of unspecified ovary [facility])     Past Surgical History:  Procedure Laterality Date  . ADENOIDECTOMY    . BREAST LUMPECTOMY Left    radiation only  . BUNIONECTOMY    . COLON SURGERY    . HEMORRHOID SURGERY    . LAPAROSCOPY    . NASAL SINUS SURGERY    . OOPHORECTOMY    . TONSILLECTOMY    . TUBAL LIGATION      Family History  Problem Relation Age of Onset  . Arthritis Mother   . Diabetes Mother   . Heart disease Mother   . Hypertension Mother   . Arthritis Father   . Cancer Father   . Depression Father   . Osteoporosis Father   . Arthritis Sister   . Cancer Sister   . Diabetes Sister   . Cancer Brother   . Cancer Sister   .  Cancer Sister   . Hyperlipidemia Sister   . Hyperlipidemia Brother     Social history:  reports that she has never smoked. She has never used smokeless tobacco. She reports current alcohol use. She reports that she does not use drugs.    Allergies  Allergen Reactions  . Cefdinir   . Cholestyramine     GERD  . Contrast Media [Iodinated Diagnostic Agents] Diarrhea  . Ezetimibe   . Meperidine   . Nitrofurantoin Diarrhea  . Statins   . Teriparatide     Medications:  Prior to Admission medications   Medication Sig Start Date End Date Taking? Authorizing Provider  Calcium Citrate-Vitamin D 315-250 MG-UNIT TABS Take by mouth.   Yes [provider]  Carbidopa-Levodopa ER (SINEMET CR) 25-100 MG tablet controlled release 3 (three) times daily. 05/01/20  Yes Leamon Arnt, MD  denosumab (PROLIA) 60 MG/ML SOSY injection Inject 60 mg into the skin every 6 (six) months.   Yes [provider]  EPINEPHRINE 0.3 mg/0.3 mL IJ SOAJ injection INJECT INTO THE MIDDLE OF THE OUTER THIGH AND HOLD FOR 3 SECONDS AS NEEDED FOR SEVERE ALLERGIC REACTION THEN CALL 911 IF USED. 02/14/19  Yes Bobbitt, [place] Muta,  MD  fluticasone (FLONASE) 50 MCG/ACT nasal spray PLACE 2 SPRAYS IN EACH NOSTRIL DAILY AS NEEDED FOR ALLERGIES OR RHINITIS 12/11/19  Yes Bobbitt, [place] Muta, MD  levothyroxine (SYNTHROID) 88 MCG tablet TAKE ONE TABLET BY MOUTH DAILY FOR BREAKFAST 04/12/20  Yes Leamon Arnt, MD  Melatonin 1 MG CAPS Take by mouth as needed.   Yes [provider]  Multiple Vitamins-Minerals (CENTRUM SILVER PO) Take by mouth.   Yes [provider]  NON [facility]  Creams-#11 Peripheral Neuropathy cream   Yes [provider]  Probiotic Product (PROBIOTIC DAILY PO) Take by mouth.   Yes [provider]  tamoxifen (NOLVADEX) 20 MG tablet Take 1 tablet (20 mg total) by mouth daily. 07/12/19  Yes Alla Feeling, NP  UNABLE TO FIND Med Name: Allergy shots  1/week   Yes [provider]  gabapentin (NEURONTIN) 100 MG capsule Take 1-3 capsules (100-300 mg total) by mouth at bedtime as needed (nerve or back pain). 10/18/19   Gregor Hams, MD    ROS:  Out of a complete 14 system review of symptoms, the patient complains only of the following symptoms, and all other reviewed systems are negative.  Muscle cramps Numbness Constipation Low back pain  Height 5\' 4"  (1.626 m), weight 133 lb 6.4 oz (60.5 kg).  Physical Exam  General: The patient is alert and cooperative at the time of the examination.  Skin: No significant peripheral edema is noted.   Neurologic Exam  Mental status: The patient is alert and oriented x 3 at the time of the examination. The patient has apparent normal recent and remote memory, with an apparently normal attention span and concentration ability.   Cranial nerves: Facial symmetry is present. Speech is normal, no aphasia or dysarthria is noted. Extraocular movements are full. Visual fields are full.  Masking of the face is seen.  Motor: The patient has good strength in all 4 extremities.  Sensory examination: Soft touch sensation is symmetric on the face, arms, and legs.  Coordination: The patient has good finger-nose-finger and heel-to-shin bilaterally.  Gait and station: The patient has a normal gait.  She is able to rise from a seated position with her arms crossed.  With walking, there may be a slight decrease in arm swing on the right.  Tandem gait is slightly unsteady. Romberg is negative. No drift is seen.  Reflexes: Deep tendon reflexes are symmetric.   Assessment/Plan:  1.  Parkinson's disease  2.  Nocturnal leg cramps  3.  Peripheral neuropathy  4.  Restless leg syndrome  The patient will go on magnesium supplementation for the leg cramps and for the constipation issues.  We will increase the Sinemet to 1 tablet 4 times daily of the 25/100 mg tablets.  The patient will take Mirapex  0.25 mg at night if needed for the restless legs.  She will follow up here in 5 months.  Jill Alexanders MD 05/16/2020 11:17 AM  [facility] [address] [place], [place] [address]  Phone [phone] Fax [phone]

## 2020-05-16 NOTE — Patient Instructions (Addendum)
May take magnesium supplimentation for the leg cramps.  Increase the sinemet (carbidopa) to one tablet 4 times a day.  Take mirapex .25 mg at night if needed for restless legs.  Sinemet (carbidopa) may result in confusion or hallucinations, drowsiness, nausea, or dizziness. If any significant side effects are noted, please contact our office. Sinemet may not be well absorbed when taken with high protein meals, if tolerated it is best to take 30-45 minutes before you eat.

## 2020-05-17 ENCOUNTER — Ambulatory Visit
Admission: RE | Admit: 2020-05-17 | Discharge: 2020-05-17 | Disposition: A | Discharge status: Home / Self Care | Payer: Medicare HMO | Source: Ambulatory Visit | Attending: Family Medicine | Admitting: Family Medicine

## 2020-05-17 DIAGNOSIS — R918 Other nonspecific abnormal finding of lung field: Secondary | ICD-10-CM

## 2020-05-17 IMAGING — CT CT CHEST W/O CM
1 series · 15 of 34 positions shown, 19 images · non-contrast
Comparison: [DATE] chest CT.

CLINICAL DATA: Follow-up pulmonary nodules. History of left breast
cancer and colon cancer.

EXAM:
CT CHEST WITHOUT CONTRAST
TECHNIQUE: Multidetector CT imaging of the chest was performed following the
standard protocol without IV contrast.

[Series 2: chest w/(date) · axial · 0.64mm/px · z∈[-281,-23]mm · 15 of 153 slices shown, 19 images]
[im 12/153  mediastinal]
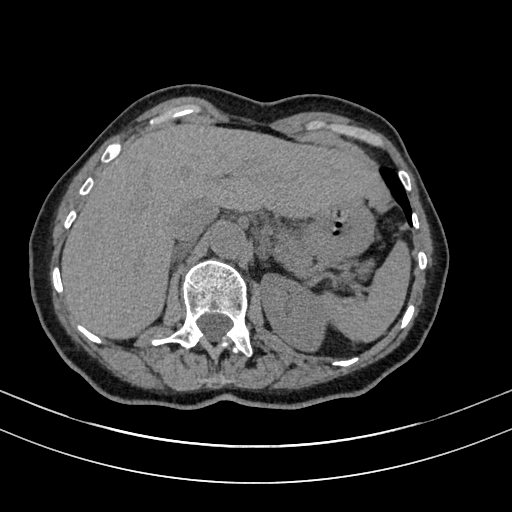
[im 12/153  lung]
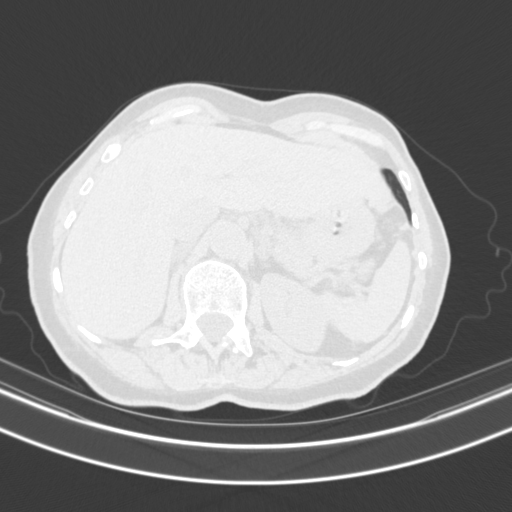
[im 23/153  lung]
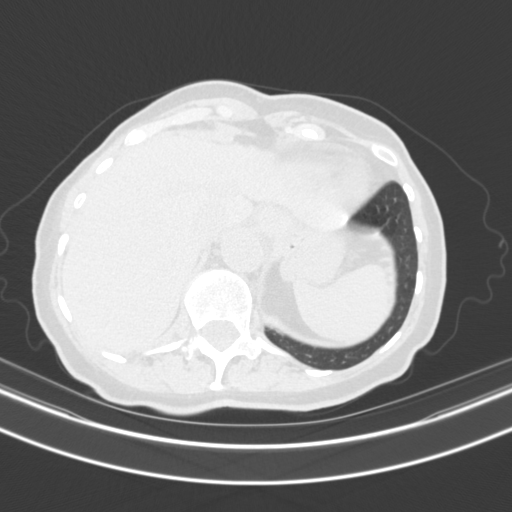
[im 31/153  lung]
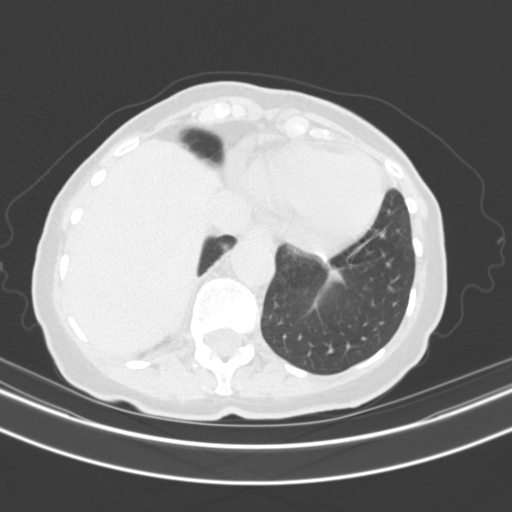
[im 40/153  lung]
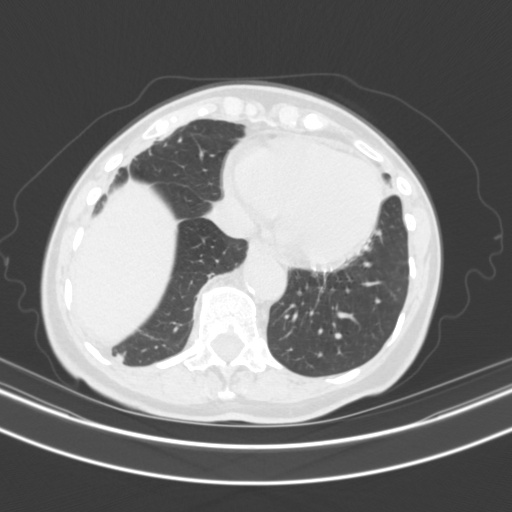
[im 51/153  mediastinal]
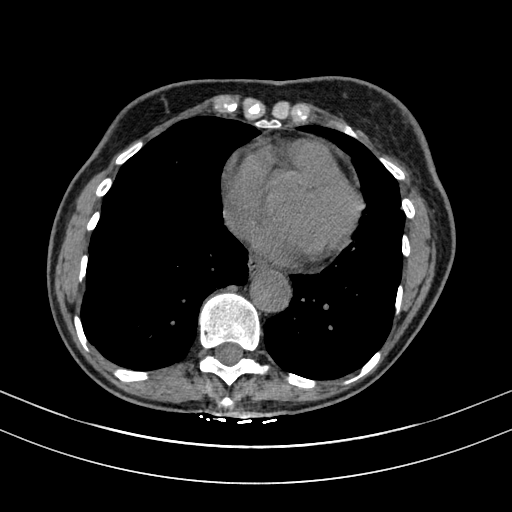
[im 51/153  lung]
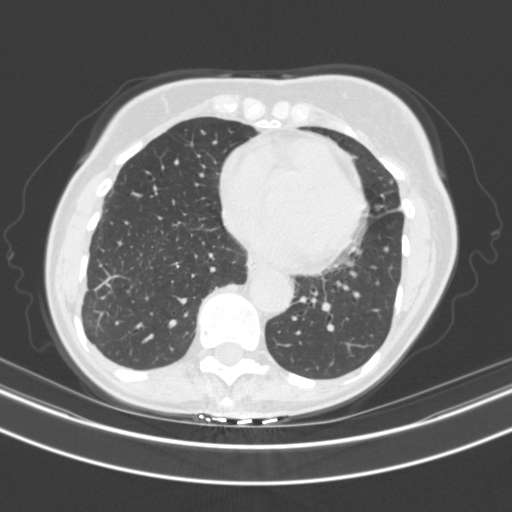
[im 61/153  lung]
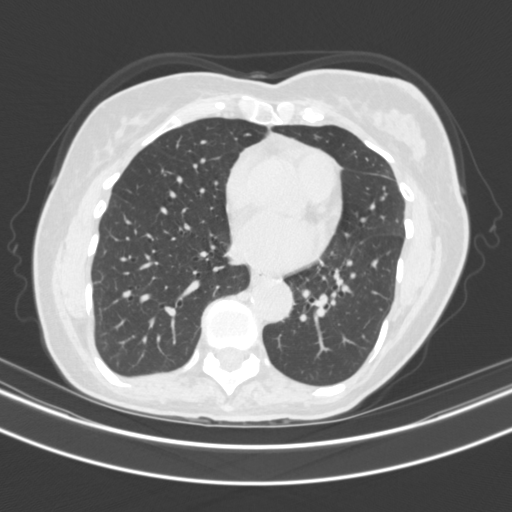
[im 68/153  lung]
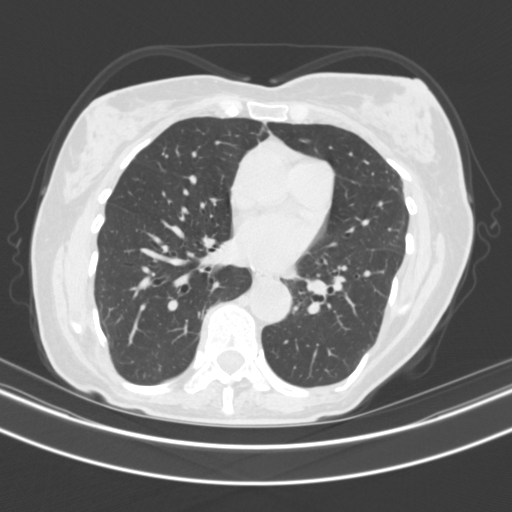
[im 79/153  lung]
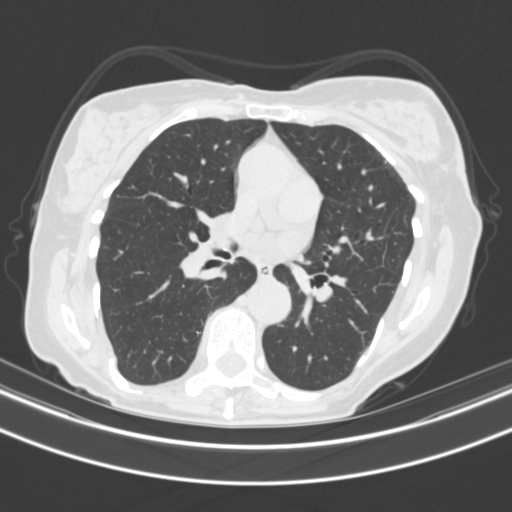
[im 85/153  mediastinal]
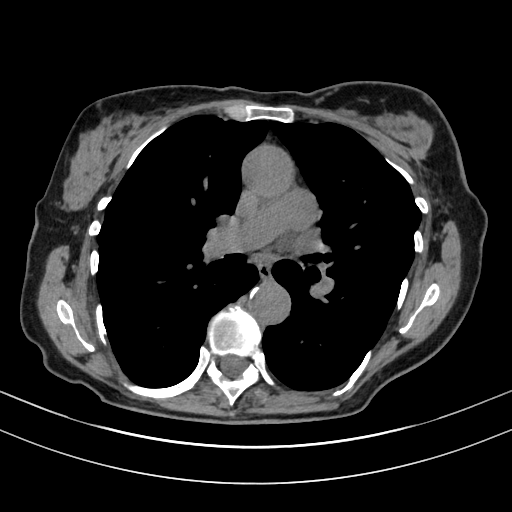
[im 85/153  lung]
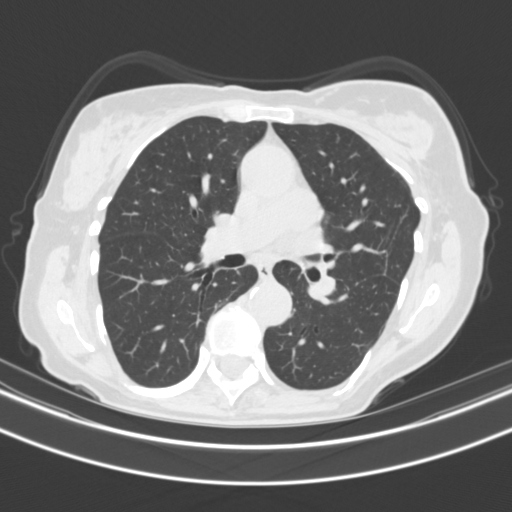
[im 92/153  lung]
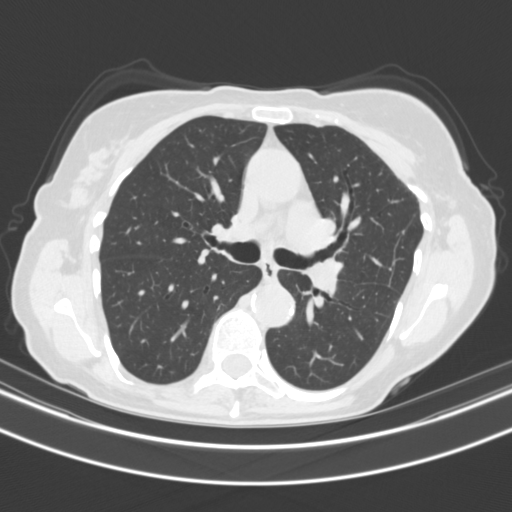
[im 102/153  lung]
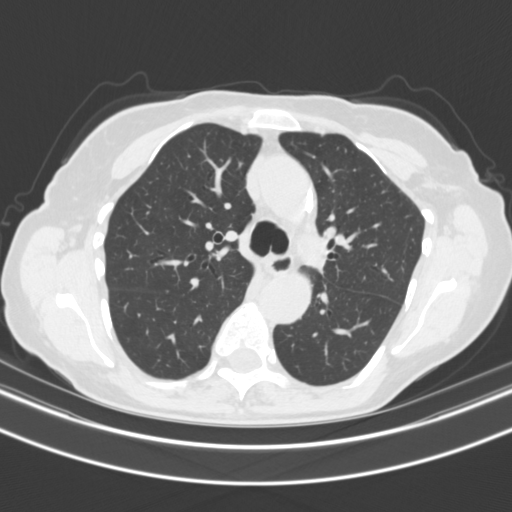
[im 113/153  lung]
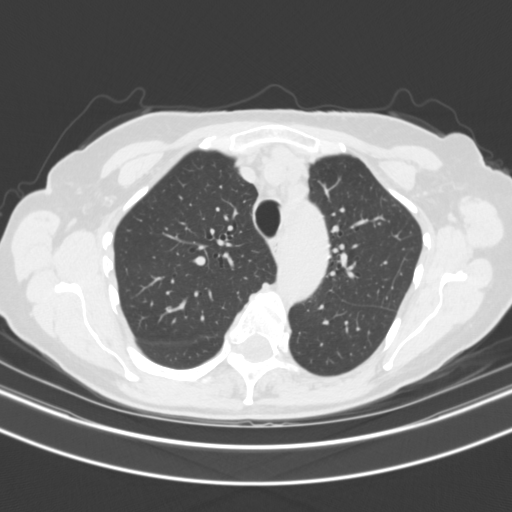
[im 122/153  mediastinal]
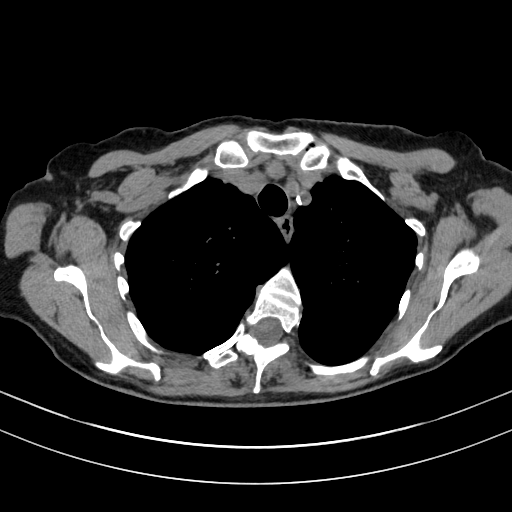
[im 122/153  lung]
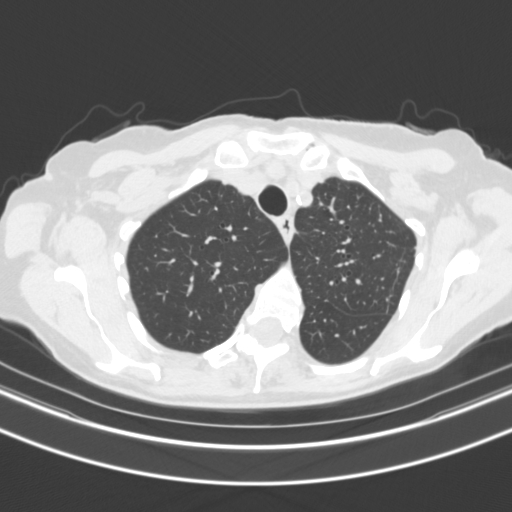
[im 130/153  lung]
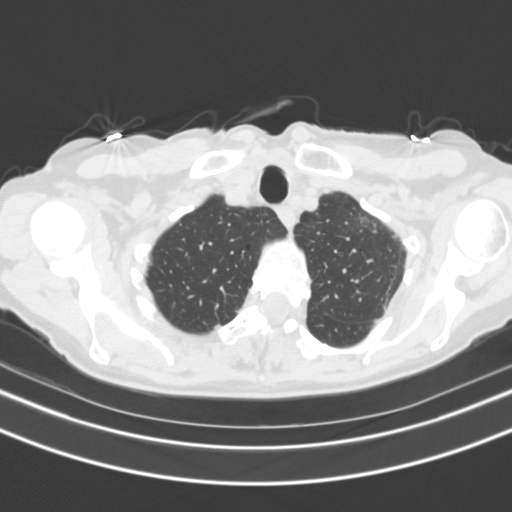
[im 141/153  lung]
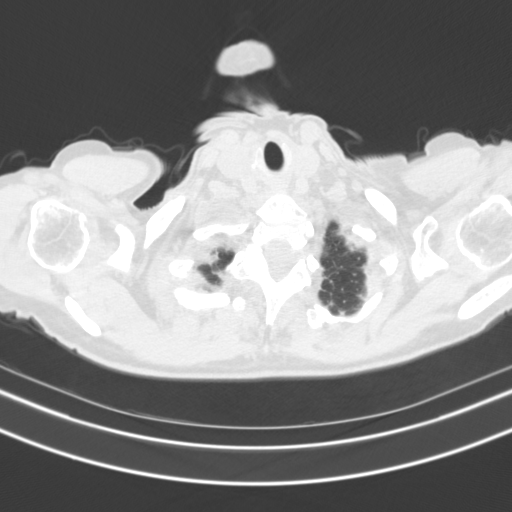

[15 of 34 positions shown; findings below may reference images not displayed]

FINDINGS: Cardiovascular: Normal heart size. Stable trace pericardial
effusion/thickening. Atherosclerotic nonaneurysmal thoracic aorta.
Normal caliber pulmonary arteries.

Mediastinum/Nodes: Subcentimeter hypodense anterior left thyroid
nodule is stable. Not clinically significant; no follow-up imaging
recommended (ref: [HOSPITAL]. [DATE]): 143-50).
Unremarkable esophagus. No pathologically enlarged axillary,
mediastinal or hilar lymph nodes, noting limited sensitivity for the
detection of hilar adenopathy on this noncontrast study.

Lungs/Pleura: No pneumothorax. No pleural effusion. Stable calcified
tiny bilateral lower lobe granulomas. A few tiny solid pulmonary
nodules in both lungs, largest 3 mm in the right upper lobe (series
5/image 30), all stable and considered benign. No new significant
pulmonary nodules. No acute consolidative airspace disease or lung
masses. Mild subpleural biapical pleural-parenchymal scarring is
unchanged. Mild patchy reticular scarring at the right lung base,
stable.

Upper abdomen: Simple 1.6 cm medial interpolar left renal cyst.

Musculoskeletal: No aggressive appearing focal osseous lesions.
Ununited chronic posterolateral right eighth rib fracture.
IMPRESSION: 1. No evidence of metastatic disease in the chest. Tiny solid
pulmonary nodules in both lungs are all stable and considered
benign.
2. Ununited chronic posterolateral right eighth rib fracture.
3. Aortic Atherosclerosis ([QT]-[QT]).

## 2020-05-21 ENCOUNTER — Ambulatory Visit (INDEPENDENT_AMBULATORY_CARE_PROVIDER_SITE_OTHER): Payer: Medicare HMO

## 2020-05-21 ENCOUNTER — Other Ambulatory Visit: Payer: Self-pay

## 2020-05-21 DIAGNOSIS — J309 Allergic rhinitis, unspecified: Secondary | ICD-10-CM

## 2020-05-25 ENCOUNTER — Other Ambulatory Visit: Payer: Self-pay | Admitting: Nurse Practitioner

## 2020-05-27 ENCOUNTER — Encounter: Payer: Self-pay | Admitting: Hematology

## 2020-05-28 ENCOUNTER — Ambulatory Visit (INDEPENDENT_AMBULATORY_CARE_PROVIDER_SITE_OTHER): Payer: Medicare HMO

## 2020-05-28 ENCOUNTER — Other Ambulatory Visit: Payer: Self-pay

## 2020-05-28 DIAGNOSIS — J309 Allergic rhinitis, unspecified: Secondary | ICD-10-CM | POA: Diagnosis not present

## 2020-06-04 ENCOUNTER — Ambulatory Visit (INDEPENDENT_AMBULATORY_CARE_PROVIDER_SITE_OTHER): Payer: Medicare HMO

## 2020-06-04 ENCOUNTER — Other Ambulatory Visit: Payer: Self-pay

## 2020-06-04 DIAGNOSIS — J309 Allergic rhinitis, unspecified: Secondary | ICD-10-CM | POA: Diagnosis not present

## 2020-06-13 ENCOUNTER — Other Ambulatory Visit: Payer: Self-pay | Admitting: Neurology

## 2020-06-13 MED ORDER — CARBIDOPA-LEVODOPA ER 25-100 MG PO TBCR: 1.0000 | EXTENDED_RELEASE_TABLET | Freq: Three times a day (TID) | ORAL | 1 refills | Status: DC

## 2020-06-18 ENCOUNTER — Other Ambulatory Visit: Payer: Self-pay

## 2020-06-18 ENCOUNTER — Ambulatory Visit (INDEPENDENT_AMBULATORY_CARE_PROVIDER_SITE_OTHER): Payer: Medicare HMO

## 2020-06-18 DIAGNOSIS — J309 Allergic rhinitis, unspecified: Secondary | ICD-10-CM | POA: Diagnosis not present

## 2020-06-18 MED ORDER — FLUTICASONE PROPIONATE 50 MCG/ACT NA SUSP: NASAL | 0 refills | Status: DC

## 2020-06-18 NOTE — Addendum Note (Signed)
Addended by: Herbie Drape on: 06/18/2020 12:17 PM   Modules accepted: Orders

## 2020-06-18 NOTE — Progress Notes (Signed)
Patient informed me that she is needing a refill for Flonase. We also received a fax for refills. Refills have been sent in and patient made aware she is due for office visit. Patient scheduled an appointment.

## 2020-06-21 ENCOUNTER — Other Ambulatory Visit: Payer: Medicare HMO

## 2020-06-21 ENCOUNTER — Ambulatory Visit: Payer: Medicare HMO

## 2020-06-26 ENCOUNTER — Telehealth: Payer: Self-pay | Admitting: Neurology

## 2020-06-26 NOTE — Telephone Encounter (Signed)
I called the patient.  Patient has been traveling, she has not been sleeping well, tremors have increased.  She is also having some troubles with constipation.  She may try milk of magnesia or Senokot for the constipation, she will try one half of a 25 mg chewable Benadryl tablet up to 3 times a day if she needs it.  She can take a higher dose at night to help her rest better.  Hopefully when she gets home and get settled down, her tremors will return to her usual baseline.

## 2020-06-26 NOTE — Telephone Encounter (Signed)
Pt called,I'm  on a trip in [place], [place], my tremors have gotten worse during the care ride. What do you recommend I do? Would like a call from the nurse.

## 2020-07-04 ENCOUNTER — Ambulatory Visit (INDEPENDENT_AMBULATORY_CARE_PROVIDER_SITE_OTHER): Payer: Medicare HMO

## 2020-07-04 ENCOUNTER — Other Ambulatory Visit: Payer: Self-pay

## 2020-07-04 DIAGNOSIS — J309 Allergic rhinitis, unspecified: Secondary | ICD-10-CM | POA: Diagnosis not present

## 2020-07-04 MED ORDER — EPINEPHRINE 0.3 MG/0.3ML IJ SOAJ: INTRAMUSCULAR | 1 refills | Status: DC

## 2020-07-04 NOTE — Addendum Note (Signed)
Addended by: Herbie Drape on: 07/04/2020 12:04 PM   Modules accepted: Orders

## 2020-07-10 ENCOUNTER — Other Ambulatory Visit: Payer: Self-pay

## 2020-07-10 ENCOUNTER — Encounter: Payer: Self-pay | Admitting: Family Medicine

## 2020-07-10 ENCOUNTER — Ambulatory Visit (INDEPENDENT_AMBULATORY_CARE_PROVIDER_SITE_OTHER): Payer: Medicare HMO | Admitting: Family Medicine

## 2020-07-10 VITALS — BP 128/82 | HR 116 | Temp 97.0°F | Wt 130.2 lb

## 2020-07-10 DIAGNOSIS — N3001 Acute cystitis with hematuria: Secondary | ICD-10-CM

## 2020-07-10 DIAGNOSIS — R399 Unspecified symptoms and signs involving the genitourinary system: Secondary | ICD-10-CM | POA: Diagnosis not present

## 2020-07-10 DIAGNOSIS — L602 Onychogryphosis: Secondary | ICD-10-CM

## 2020-07-10 LAB — POCT URINALYSIS DIPSTICK
Bilirubin, UA: NEGATIVE
Glucose, UA: NEGATIVE
Ketones, UA: POSITIVE
Nitrite, UA: POSITIVE
Protein, UA: POSITIVE — AB
Spec Grav, UA: 1.025 (ref 1.010–1.025)
Urobilinogen, UA: 0.2 E.U./dL
pH, UA: 6 (ref 5.0–8.0)

## 2020-07-10 MED ORDER — SULFAMETHOXAZOLE-TRIMETHOPRIM 800-160 MG PO TABS: 1.0000 | ORAL_TABLET | Freq: Two times a day (BID) | ORAL | 0 refills | Status: DC

## 2020-07-10 NOTE — Progress Notes (Signed)
Subjective   CC:  Chief Complaint  Patient presents with  . Urinary Tract Infection    Symptoms started yesterday, Urine frequency and nerve pain in hand     HPI: Julia [name] is a 80 y.o. female who presents to the office today to address the problems listed above in the chief complaint.  Patient reports dysuria and urinary frequency twice over the last 24 hours. No severe sxs.  She has h/o e.coli UTI last in 2020.   She denies fevers flank pain nausea vomiting or gross hematuria.  Symptoms have been present for 2 days. She has not taken any AZO.   She denies history of interstitial cystitis.  She denies vaginal symptoms including vaginal discharge or pelvic pain.   Assessment  1. Acute cystitis with hematuria   2. UTI symptoms      Plan   Acute cystitis clinically with abnormal dipstick.  Await culture.  Septra DS 1 p.o. twice daily x5 days.  Increase fluid intake.  Follow-up if needed  Follow up: As needed Orders Placed This Encounter  Procedures  . Urine Culture  . POCT Urinalysis Dipstick   Meds ordered this encounter  Medications  . sulfamethoxazole-trimethoprim (BACTRIM DS) 800-160 MG tablet    Sig: Take 1 tablet by mouth 2 (two) times daily.    Dispense:  10 tablet    Refill:  0      I reviewed the patients updated PMH, FH, and SocHx.    Patient Active Problem List   Diagnosis Date Noted  . Statin intolerance 10/13/2018    Priority: High  . Parkinson's disease ([place]) 09/29/2017    Priority: High  . Age-related osteoporosis without current pathological fracture 07/20/2016    Priority: High  . Mixed hyperlipidemia 03/07/2015    Priority: High  . Impaired fasting glucose 03/07/2015    Priority: High  . History of colon cancer 02/13/2015    Priority: High  . Hypothyroidism due to Hashimoto's thyroiditis 02/13/2015    Priority: High  . History of breast cancer left, 2016 02/13/2015    Priority: High  . Multiple pulmonary nodules 06/20/2019    Priority:  Medium  . RLS (restless legs syndrome) 07/14/2018    Priority: Medium  . 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 Anxiety 09/29/2017    Priority: Medium  . Dysfunctional voiding of urine 09/13/2016    Priority: Medium  . 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 Gastroesophageal reflux disease 09/13/2016    Priority: Medium  . Drug-induced polyneuropathy ([place]) 03/07/2015    Priority: Medium  . Irritable bowel syndrome with both constipation and diarrhea 02/13/2015    Priority: Medium  . 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 Perennial and seasonal allergic rhinitis 02/01/2018    Priority: Low  . Atrophic vaginitis 02/13/2015    Priority: Low  . Recurrent UTI 02/13/2015    Priority: Low  . Vitamin D deficiency disease 02/13/2015    Priority: Low  . Nocturnal leg cramps 11/13/2019  . Pelvic floor dysfunction 08/26/2018  . Female bladder prolapse 04/07/2018  . History of nasal polyp 02/01/2018   Current Meds  Medication Sig  . Calcium Citrate-Vitamin D 315-250 MG-UNIT TABS Take by mouth.  . Carbidopa-Levodopa ER (SINEMET CR) 25-100 MG tablet controlled release Take 1 tablet by mouth in the morning, at noon, and at bedtime.  Marland Kitchen denosumab (PROLIA) 60 MG/ML SOSY  injection Inject 60 mg into the skin every 6 (six) months.  . diphenhydrAMINE (BENADRYL ALLERGY) 25 MG tablet Take 25 mg by mouth every 6 (six) hours as needed.  Marland Kitchen EPINEPHrine 0.3 mg/0.3 mL IJ SOAJ injection INJECT INTO THE MIDDLE OF THE OUTER THIGH AND HOLD FOR 3 SECONDS AS NEEDED FOR SEVERE ALLERGIC REACTION THEN CALL 911 IF USED.  . fluticasone (FLONASE) 50 MCG/ACT nasal spray PLACE 2 SPRAYS IN EACH NOSTRIL DAILY AS NEEDED FOR ALLERGIES OR RHINITIS  . levothyroxine (SYNTHROID) 88 MCG tablet TAKE ONE TABLET BY MOUTH DAILY FOR BREAKFAST  . Melatonin 1 MG CAPS Take by mouth as needed.  . Multiple Vitamins-Minerals (CENTRUM SILVER PO) Take by  mouth.  . NON FORMULARY [facility]  Creams-#11 Peripheral Neuropathy cream  . pramipexole (MIRAPEX) 0.25 MG tablet Take 1 tablet (0.25 mg total) by mouth at bedtime.  . Probiotic Product (PROBIOTIC DAILY PO) Take by mouth.  . sulfamethoxazole-trimethoprim (BACTRIM DS) 800-160 MG tablet Take 1 tablet by mouth 2 (two) times daily.  . tamoxifen (NOLVADEX) 20 MG tablet TAKE ONE TABLET BY MOUTH DAILY  . UNABLE TO FIND Med Name: Allergy shots 1/week    Review of Systems: Cardiovascular: negative for chest pain Respiratory: negative for SOB or persistent cough Gastrointestinal: negative for abdominal pain Constitutional: Negative for fever malaise or anorexia  Objective  Vitals: BP 128/82   Pulse (!) 116   Temp (!) 97 F (36.1 C) (Temporal)   Wt 130 lb 3.2 oz (59.1 kg)   SpO2 97%   BMI 22.35 kg/m  General: no acute distress, appears well Gastrointestinal: soft, flat abdomen, normal active bowel sounds, no palpable masses, no hepatosplenomegaly, no appreciated hernias, NO CVAT, mild suprapubic ttp w/o rebound or guarding  Office Visit on 07/10/2020  Component Date Value Ref Range Status  . Color, UA 07/10/2020 yellow   Final  . Clarity, UA 07/10/2020 very cloudy   Final  . Glucose, UA 07/10/2020 Negative  Negative Final  . Bilirubin, UA 07/10/2020 neg   Final  . Ketones, UA 07/10/2020 pos 5mg /dL   Final  . Spec Grav, UA 07/10/2020 1.025  1.010 - 1.025 Final  . Blood, UA 07/10/2020 2+ 80 Ery/uL   Final  . pH, UA 07/10/2020 6.0  5.0 - 8.0 Final  . Protein, UA 07/10/2020 Positive* Negative Final  . Urobilinogen, UA 07/10/2020 0.2  0.2 or 1.0 E.U./dL Final  . Nitrite, UA 07/10/2020 positive   Final  . Leukocytes, UA 07/10/2020 Large (3+)* Negative Final    Commons side effects, risks, benefits, and alternatives for medications and treatment plan prescribed today were discussed, and the patient expressed understanding of the given instructions. Patient is instructed to call  or message via MyChart if he/she has any questions or concerns regarding our treatment plan. No barriers to understanding were identified. We discussed Red Flag symptoms and signs in detail. Patient expressed understanding regarding what to do in case of urgent or emergency type symptoms.   Medication list was reconciled, printed and provided to the patient in AVS. Patient instructions and summary information was reviewed with the patient as documented in the AVS. This note was prepared with assistance of Dragon voice recognition software. Occasional wrong-word or sound-a-like substitutions may have occurred due to the inherent limitations of voice recognition software

## 2020-07-12 LAB — URINE CULTURE
MICRO NUMBER:: 11765872
SPECIMEN QUALITY:: ADEQUATE

## 2020-07-15 NOTE — Progress Notes (Signed)
+   UTI sensitive to abx used. No further action needed

## 2020-07-23 ENCOUNTER — Other Ambulatory Visit: Payer: Self-pay

## 2020-07-23 ENCOUNTER — Ambulatory Visit (INDEPENDENT_AMBULATORY_CARE_PROVIDER_SITE_OTHER): Payer: Medicare HMO

## 2020-07-23 DIAGNOSIS — J309 Allergic rhinitis, unspecified: Secondary | ICD-10-CM | POA: Diagnosis not present

## 2020-08-05 ENCOUNTER — Other Ambulatory Visit: Payer: Self-pay

## 2020-08-05 ENCOUNTER — Encounter: Payer: Self-pay | Admitting: Podiatry

## 2020-08-05 ENCOUNTER — Ambulatory Visit: Payer: Medicare HMO | Admitting: Podiatry

## 2020-08-05 DIAGNOSIS — G629 Polyneuropathy, unspecified: Secondary | ICD-10-CM

## 2020-08-05 DIAGNOSIS — B351 Tinea unguium: Secondary | ICD-10-CM | POA: Diagnosis not present

## 2020-08-05 DIAGNOSIS — M79675 Pain in left toe(s): Secondary | ICD-10-CM | POA: Diagnosis not present

## 2020-08-05 DIAGNOSIS — M79674 Pain in right toe(s): Secondary | ICD-10-CM

## 2020-08-05 NOTE — Progress Notes (Signed)
Complaint:  Visit Type: Patient returns to my office for continued preventative foot care services. Complaint: Patient states" my nails have grown long and thick and become painful to walk and wear shoes" Patient has been diagnosed with Parkinsons  with no foot complications.  Patient has history of bunion surgery both big toe joints.  She also has history of nail removal second toe right foot.  There is a small nail spicule second toe right foot. The patient presents for preventative foot care services.  Podiatric Exam: Vascular: dorsalis pedis and posterior tibial pulses are palpable bilateral. Capillary return is immediate. Temperature gradient is WNL. Skin turgor WNL  Sensorium: Normal Semmes Weinstein monofilament test. Normal tactile sensation bilaterally. Nail Exam: Pt has thick disfigured discolored nails with subungual debris noted bilateral entire nail hallux through fifth toenails. Ulcer Exam: There is no evidence of ulcer or pre-ulcerative changes or infection. Orthopedic Exam: Muscle tone and strength are WNL. No limitations in general ROM. No crepitus or effusions noted. Foot type and digits show no abnormalities. Bony prominences are unremarkable. Skin: No Porokeratosis. No infection or ulcers.  Asymptomatic callus sub 1st MPJ left foot.  Diagnosis:  Onychomycosis, , Pain in right toe, pain in left toes  Treatment & Plan Procedures and Treatment: Consent by patient was obtained for treatment procedures.   Debridement of mycotic and hypertrophic toenails, 1 through 5 bilateral and clearing of subungual debris. No ulceration, no infection noted.  Return Visit-Office Procedure: Patient instructed to return to the office for a follow up visit 3 months for continued evaluation and treatment.    Gardiner Barefoot DPM

## 2020-08-06 ENCOUNTER — Telehealth: Payer: Self-pay

## 2020-08-06 NOTE — Telephone Encounter (Signed)
Patient called wanting to know if she could do her physical therapy after her injection, and I told her there should be a 2 hour wait after she receives her injections for any strenous activity any kind. She said she may come in to get her injection at 9 so she can have physical therapy at 60 on Thursdays.

## 2020-08-08 ENCOUNTER — Other Ambulatory Visit: Payer: Self-pay

## 2020-08-08 ENCOUNTER — Ambulatory Visit (INDEPENDENT_AMBULATORY_CARE_PROVIDER_SITE_OTHER): Payer: Medicare HMO

## 2020-08-08 DIAGNOSIS — J309 Allergic rhinitis, unspecified: Secondary | ICD-10-CM | POA: Diagnosis not present

## 2020-08-13 NOTE — Progress Notes (Signed)
VIALS EXP 08-13-21 

## 2020-08-14 DIAGNOSIS — J3089 Other allergic rhinitis: Secondary | ICD-10-CM

## 2020-08-15 DIAGNOSIS — J301 Allergic rhinitis due to pollen: Secondary | ICD-10-CM

## 2020-08-20 ENCOUNTER — Other Ambulatory Visit: Payer: Self-pay

## 2020-08-20 ENCOUNTER — Ambulatory Visit (INDEPENDENT_AMBULATORY_CARE_PROVIDER_SITE_OTHER): Payer: Medicare HMO

## 2020-08-20 DIAGNOSIS — J309 Allergic rhinitis, unspecified: Secondary | ICD-10-CM

## 2020-08-25 ENCOUNTER — Encounter: Payer: Self-pay | Admitting: Family Medicine

## 2020-09-11 ENCOUNTER — Telehealth: Payer: Self-pay

## 2020-09-11 NOTE — Telephone Encounter (Signed)
Prolia approved from 09/10/2020-09/10/2021  [id number]  Auth #

## 2020-09-11 NOTE — Telephone Encounter (Signed)
Called to schedule patient for prolia injection. Patient states that she is experiencing a lot of muscular pain and thinks it is related to the injections. Wanting to know what PCP thinks. Please advise

## 2020-09-12 ENCOUNTER — Other Ambulatory Visit: Payer: Self-pay

## 2020-09-12 ENCOUNTER — Ambulatory Visit (INDEPENDENT_AMBULATORY_CARE_PROVIDER_SITE_OTHER): Payer: Medicare HMO

## 2020-09-12 DIAGNOSIS — J309 Allergic rhinitis, unspecified: Secondary | ICD-10-CM | POA: Diagnosis not present

## 2020-09-16 ENCOUNTER — Encounter: Payer: Self-pay | Admitting: Hematology

## 2020-09-20 ENCOUNTER — Ambulatory Visit
Admission: RE | Admit: 2020-09-20 | Discharge: 2020-09-20 | Disposition: A | Discharge status: Home / Self Care | Payer: Medicare HMO | Source: Ambulatory Visit | Attending: Hematology | Admitting: Hematology

## 2020-09-20 ENCOUNTER — Other Ambulatory Visit: Payer: Self-pay

## 2020-09-20 DIAGNOSIS — Z1231 Encounter for screening mammogram for malignant neoplasm of breast: Secondary | ICD-10-CM

## 2020-09-20 DIAGNOSIS — E2839 Other primary ovarian failure: Secondary | ICD-10-CM

## 2020-09-20 IMAGING — MG DIGITAL SCREENING BILAT W/ CAD
5 series · 5 of 5 positions shown · non-contrast
Comparison: Previous exam(s).

CLINICAL DATA: Screening.

EXAM:
DIGITAL SCREENING BILATERAL MAMMOGRAM WITH CAD
TECHNIQUE: Bilateral screening digital craniocaudal and mediolateral oblique
mammograms were obtained. The images were evaluated with
computer-aided detection.

[R MLO]
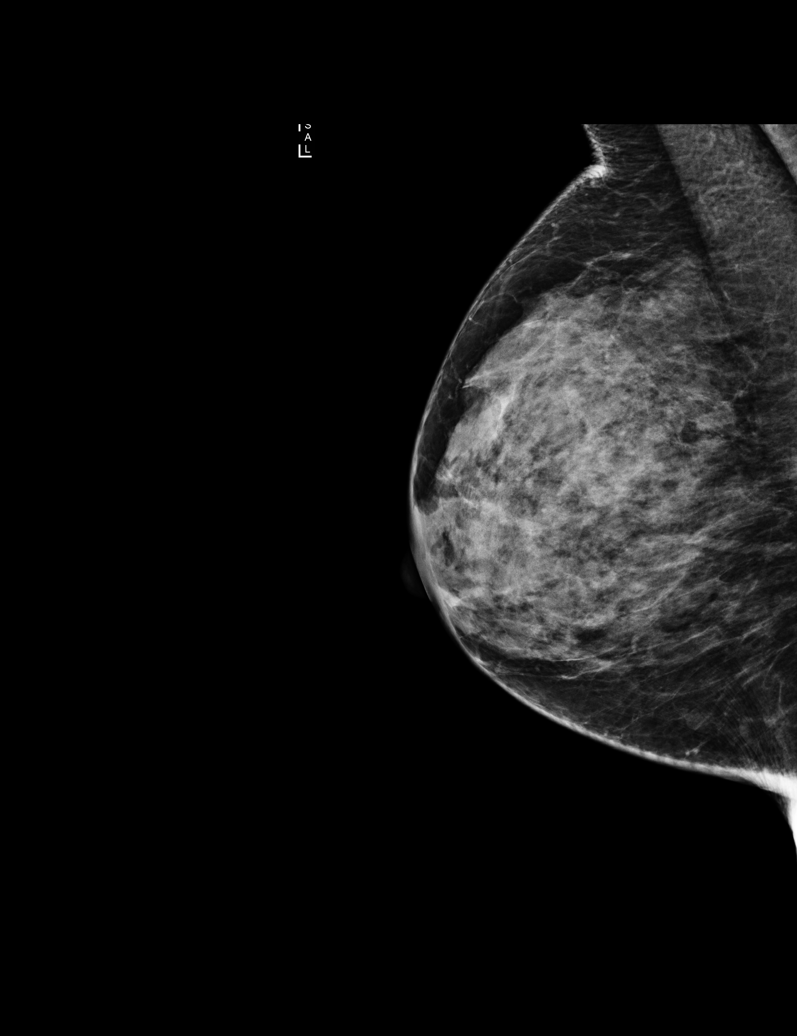

[L XCCL]
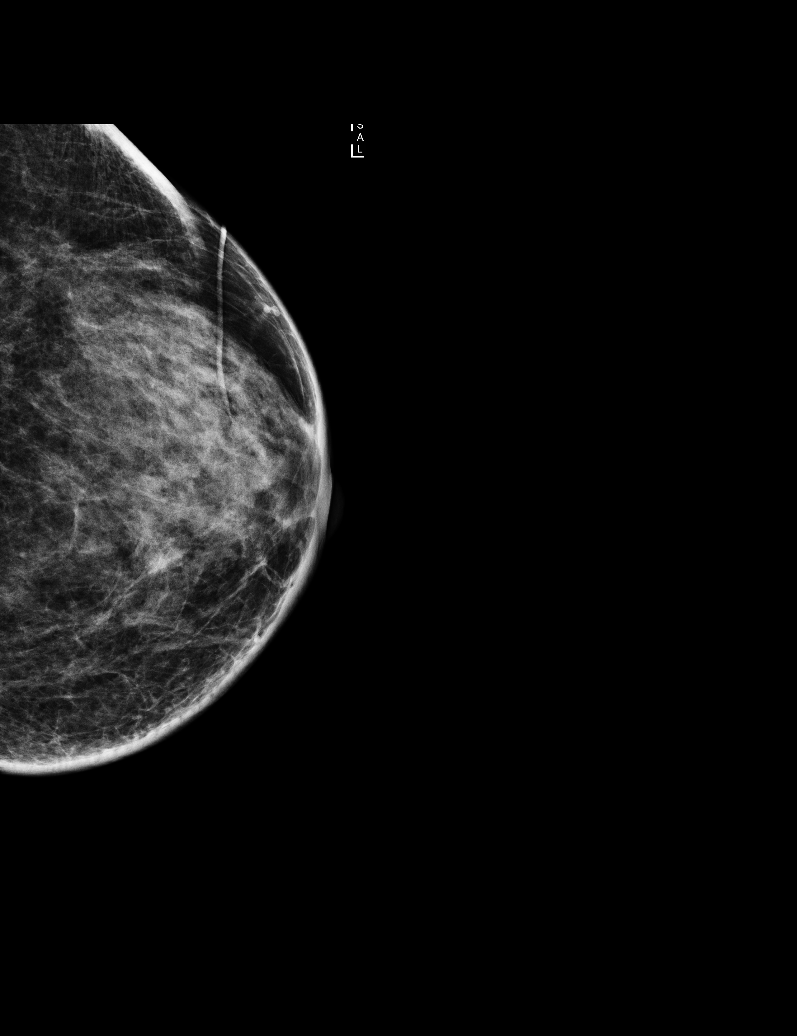

[L CC]
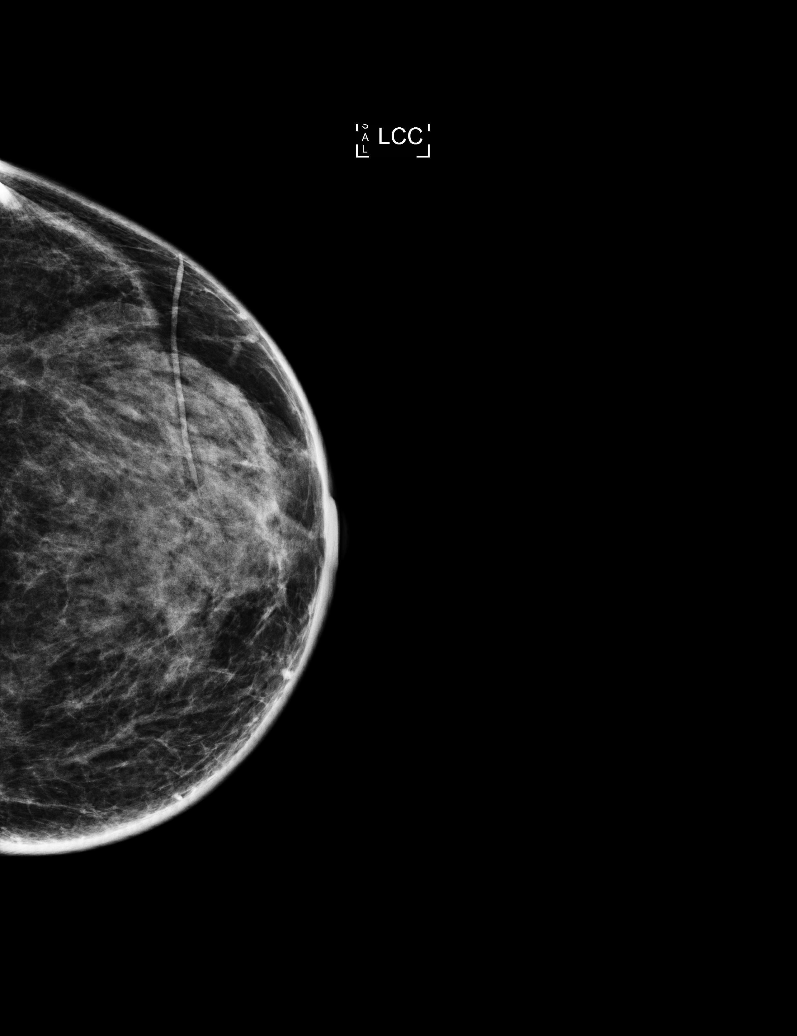

[L MLO]
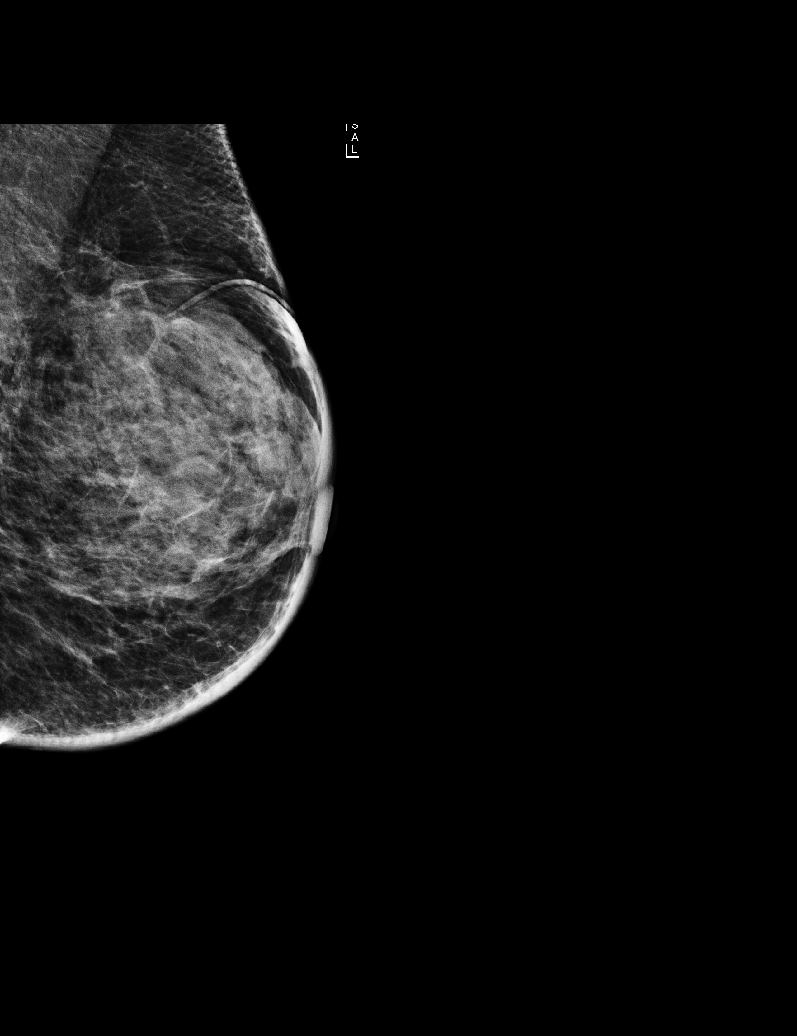

[R CC]
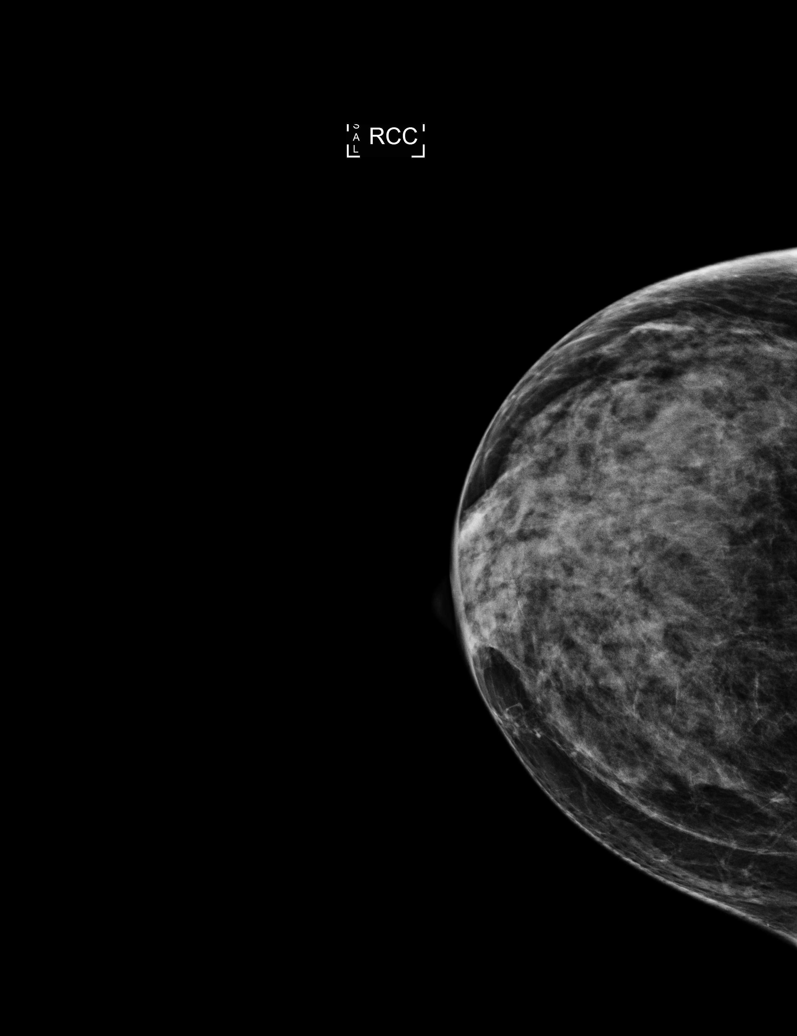

[5 of 5 positions shown; findings below may reference images not displayed]

ACR Breast Density Category d: The breast tissue is extremely dense,
which lowers the sensitivity of mammography
FINDINGS: There are no findings suspicious for malignancy.
IMPRESSION: No mammographic evidence of malignancy. A result letter of this
screening mammogram will be mailed directly to the patient.

RECOMMENDATION:
Screening mammogram in one year. (Code:[0V])

BI-RADS CATEGORY  1: Negative.

## 2020-09-23 NOTE — Telephone Encounter (Signed)
LMOVM advising patient of PCP recommendations

## 2020-09-26 ENCOUNTER — Ambulatory Visit: Payer: Medicare HMO

## 2020-09-26 ENCOUNTER — Ambulatory Visit: Payer: Medicare HMO | Admitting: Allergy

## 2020-10-01 ENCOUNTER — Ambulatory Visit (INDEPENDENT_AMBULATORY_CARE_PROVIDER_SITE_OTHER): Payer: Medicare HMO

## 2020-10-01 ENCOUNTER — Other Ambulatory Visit: Payer: Self-pay

## 2020-10-01 DIAGNOSIS — J309 Allergic rhinitis, unspecified: Secondary | ICD-10-CM

## 2020-10-08 ENCOUNTER — Telehealth: Payer: Self-pay

## 2020-10-08 ENCOUNTER — Ambulatory Visit (INDEPENDENT_AMBULATORY_CARE_PROVIDER_SITE_OTHER): Payer: Medicare HMO

## 2020-10-08 ENCOUNTER — Other Ambulatory Visit: Payer: Self-pay

## 2020-10-08 DIAGNOSIS — J309 Allergic rhinitis, unspecified: Secondary | ICD-10-CM

## 2020-10-08 MED ORDER — LEVOTHYROXINE SODIUM 88 MCG PO TABS: ORAL_TABLET | ORAL | 0 refills | Status: DC

## 2020-10-08 NOTE — Telephone Encounter (Signed)
.   LAST APPOINTMENT DATE: 09/11/2020   NEXT APPOINTMENT DATE:@7 /20/2022  MEDICATION:levothyroxine (SYNTHROID) 88 MCG tablet  PHARMACY:[facility] [id number] - [place], [place]  Patient is requesting a 3 month supply

## 2020-10-08 NOTE — Telephone Encounter (Signed)
Rx sent to pharmacy   

## 2020-10-14 ENCOUNTER — Other Ambulatory Visit: Payer: Self-pay | Admitting: Family Medicine

## 2020-10-15 ENCOUNTER — Other Ambulatory Visit: Payer: Self-pay | Admitting: Orthopaedic Surgery

## 2020-10-15 ENCOUNTER — Other Ambulatory Visit: Payer: Self-pay

## 2020-10-15 ENCOUNTER — Other Ambulatory Visit (HOSPITAL_COMMUNITY): Payer: Self-pay | Admitting: Orthopaedic Surgery

## 2020-10-15 ENCOUNTER — Ambulatory Visit: Payer: Medicare HMO

## 2020-10-15 ENCOUNTER — Ambulatory Visit: Payer: Medicare HMO | Admitting: Allergy

## 2020-10-15 ENCOUNTER — Other Ambulatory Visit: Payer: Self-pay | Admitting: Allergy

## 2020-10-15 ENCOUNTER — Ambulatory Visit (INDEPENDENT_AMBULATORY_CARE_PROVIDER_SITE_OTHER): Payer: Medicare HMO

## 2020-10-15 DIAGNOSIS — M545 Low back pain, unspecified: Secondary | ICD-10-CM

## 2020-10-15 DIAGNOSIS — J309 Allergic rhinitis, unspecified: Secondary | ICD-10-CM | POA: Diagnosis not present

## 2020-10-15 DIAGNOSIS — M4726 Other spondylosis with radiculopathy, lumbar region: Secondary | ICD-10-CM

## 2020-10-16 ENCOUNTER — Ambulatory Visit: Payer: Medicare HMO

## 2020-10-17 ENCOUNTER — Ambulatory Visit: Payer: Medicare HMO | Admitting: Neurology

## 2020-10-24 ENCOUNTER — Ambulatory Visit (INDEPENDENT_AMBULATORY_CARE_PROVIDER_SITE_OTHER): Payer: Medicare HMO

## 2020-10-24 DIAGNOSIS — J309 Allergic rhinitis, unspecified: Secondary | ICD-10-CM

## 2020-10-25 ENCOUNTER — Ambulatory Visit (HOSPITAL_COMMUNITY)
Admission: RE | Admit: 2020-10-25 | Discharge: 2020-10-25 | Disposition: A | Discharge status: Home / Self Care | Payer: Medicare HMO | Source: Ambulatory Visit | Attending: Orthopaedic Surgery | Admitting: Orthopaedic Surgery

## 2020-10-25 ENCOUNTER — Other Ambulatory Visit: Payer: Self-pay

## 2020-10-25 DIAGNOSIS — M4726 Other spondylosis with radiculopathy, lumbar region: Secondary | ICD-10-CM | POA: Diagnosis present

## 2020-10-25 DIAGNOSIS — M545 Low back pain, unspecified: Secondary | ICD-10-CM

## 2020-10-25 IMAGING — MR MR LUMBAR SPINE W/O CM
4 of 5 series · 19 of 48 positions shown · non-contrast
Comparison: [DATE]

CLINICAL DATA: Low back pain with unspecified laterality and
chronicity. Radiculopathy

EXAM:
MRI LUMBAR SPINE WITHOUT CONTRAST
TECHNIQUE: Multiplanar, multisequence MR imaging of the lumbar spine was
performed. No intravenous contrast was administered.

[Series 3: T2 · sagittal · 4.0mm · 0.55mm/px · 6 of 17 slices shown (1 of 2)]
[im 1/17]
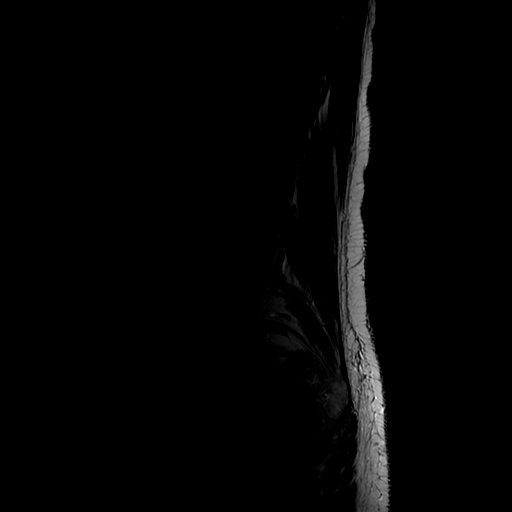
[im 4/17]
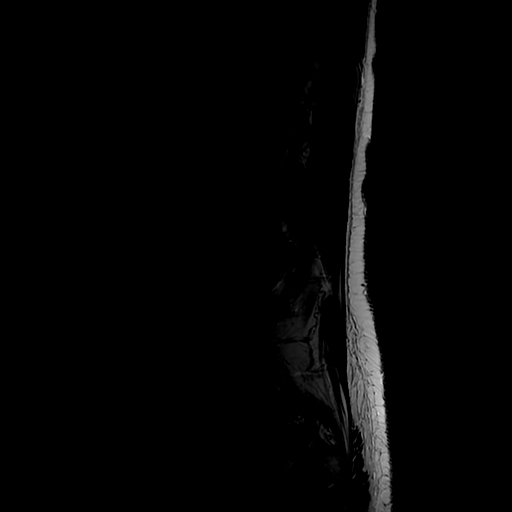
[im 7/17]
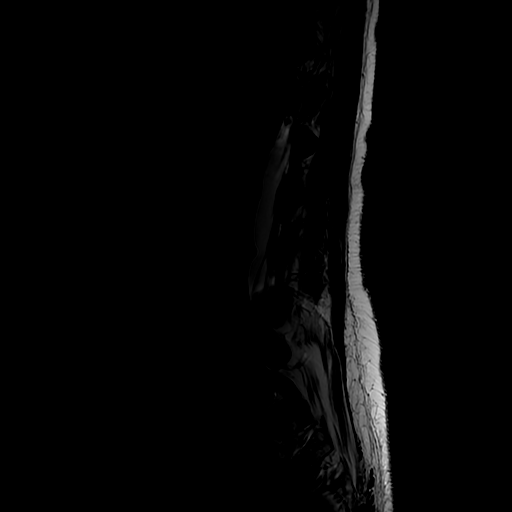
[im 10/17]
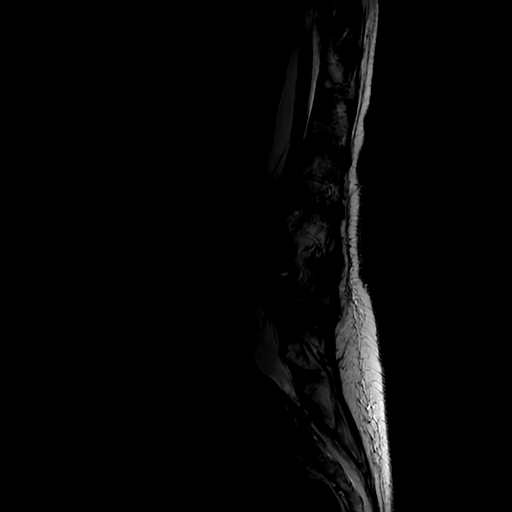
[im 13/17]
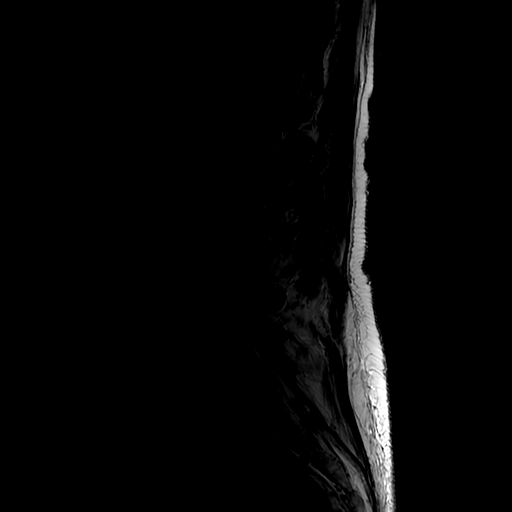
[im 17/17]
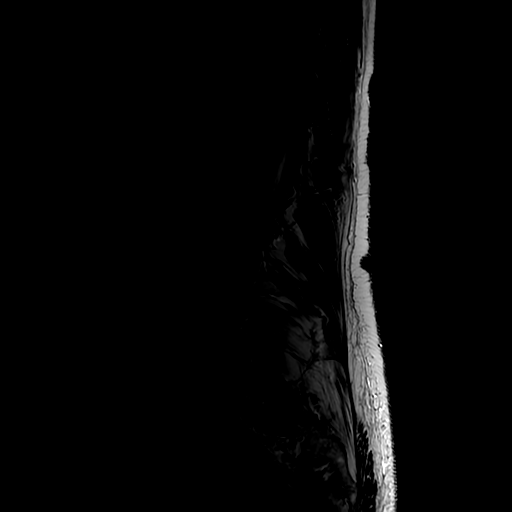

[Series 5: T1 · sagittal · 4.0mm · 0.51mm/px · 3 of 17 slices shown (1 of 2)]
[im 3/17]
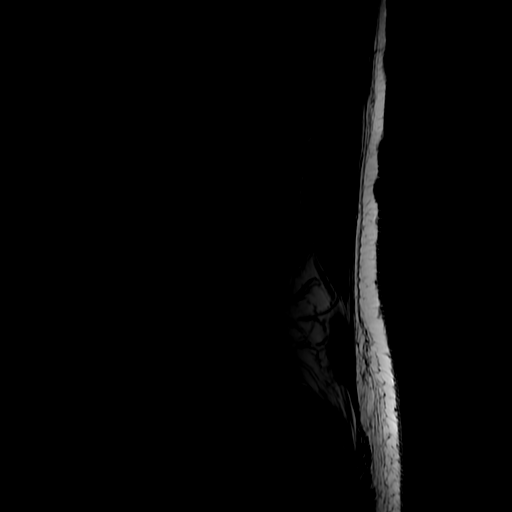
[im 9/17]
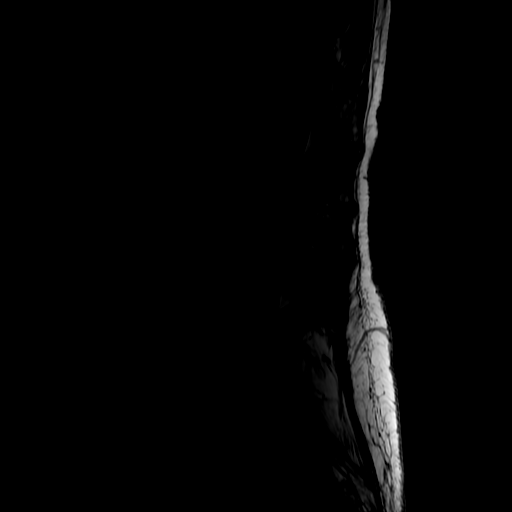
[im 14/17]
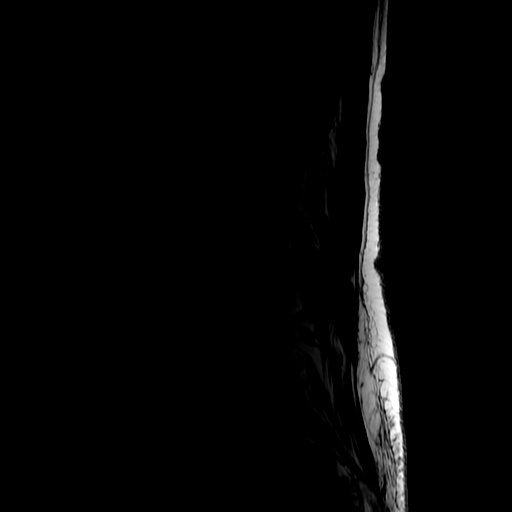

[Series 6: T2 · axial · 4.0mm · 0.39mm/px · z∈[-10,+164]mm · 7 of 35 slices shown (2 of 2)]
[im 1/35]
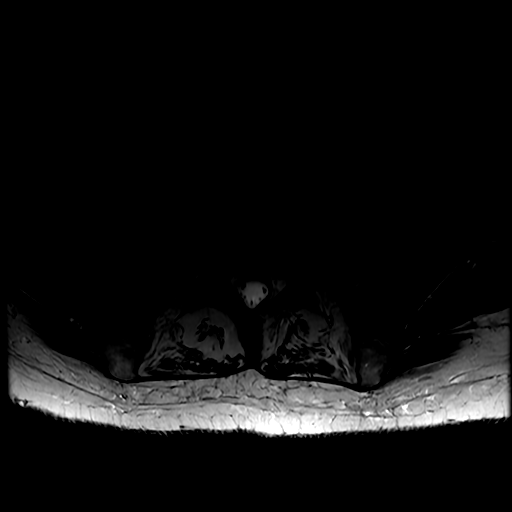
[im 6/35]
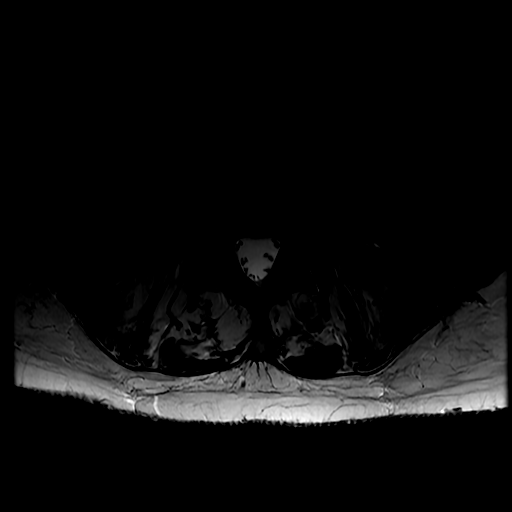
[im 11/35]
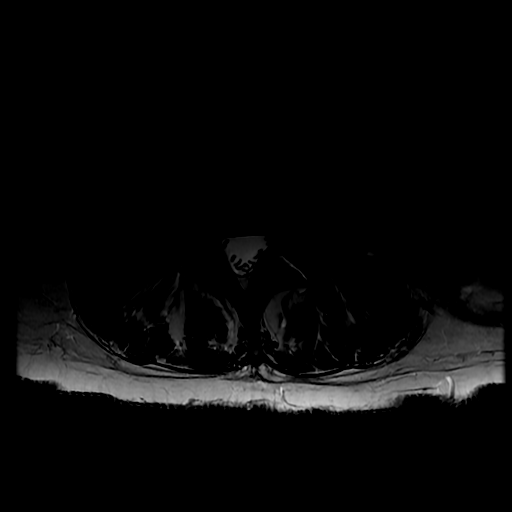
[im 16/35]
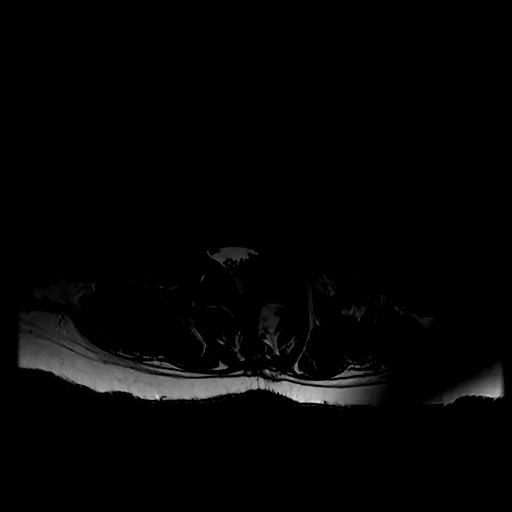
[im 19/35]
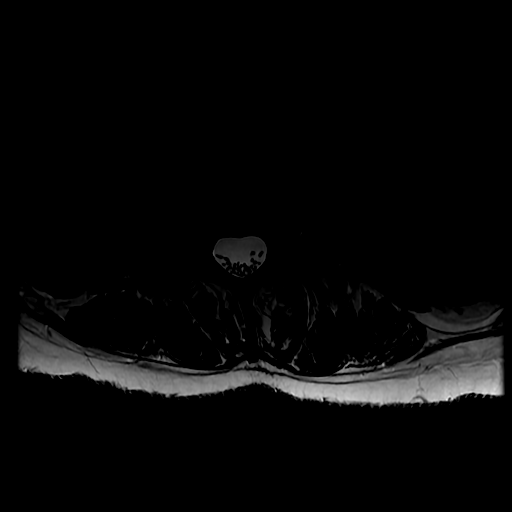
[im 24/35]
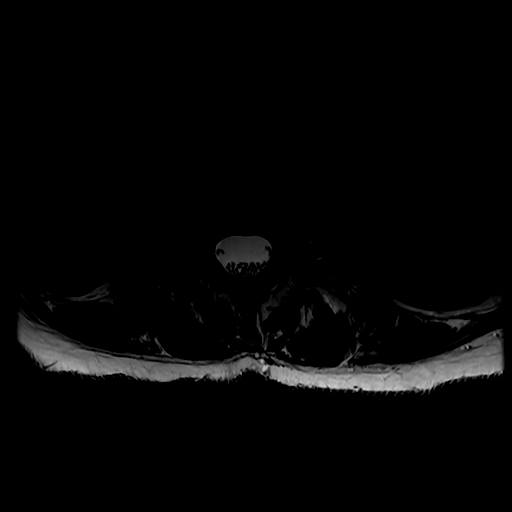
[im 29/35]
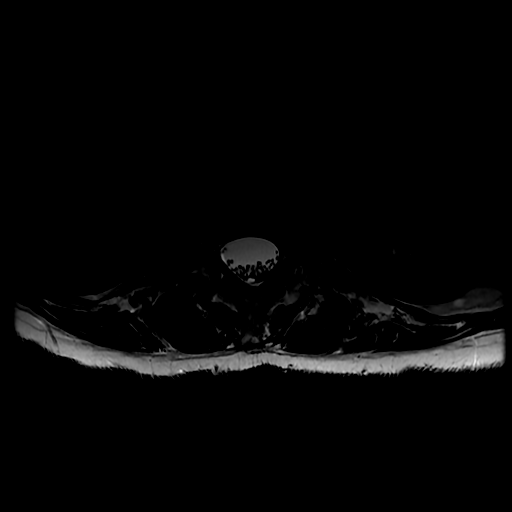

[Series 7: T1 · axial · 4.0mm · 0.39mm/px · z∈[+15,+164]mm · 3 of 35 slices shown (2 of 2)]
[im 6/35]
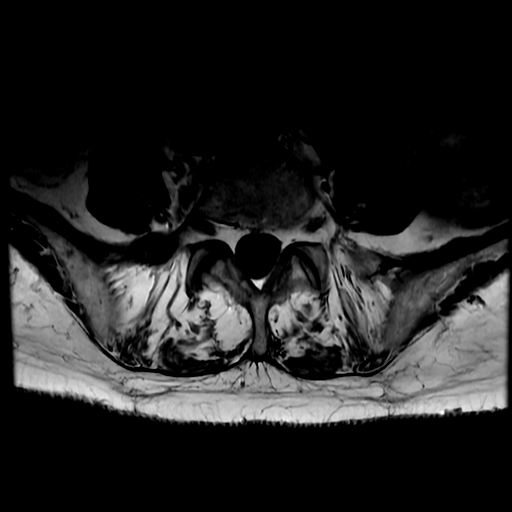
[im 19/35]
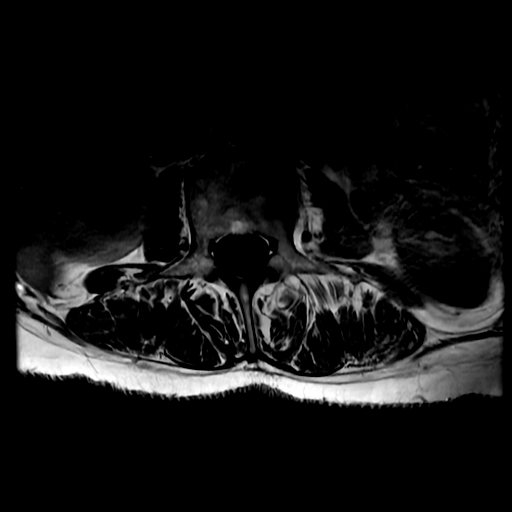
[im 29/35]
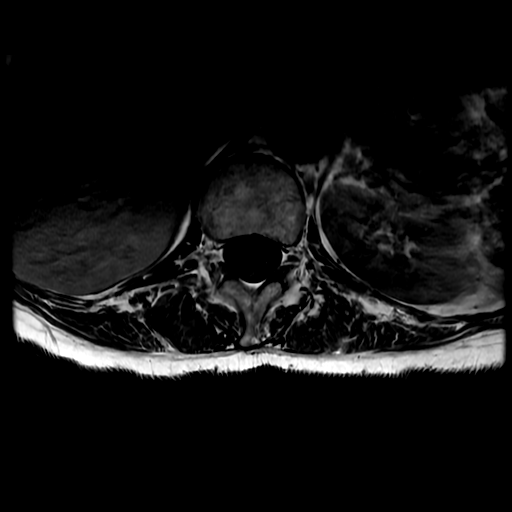

[19 of 48 positions shown; findings below may reference images not displayed]

FINDINGS: Segmentation: 5 lumbar type vertebrae based on the available
coverage.

Alignment:  Dextroscoliosis

Vertebrae: Degenerative endplate edema at L3-4, also seen
previously. No fracture or aggressive bone lesion

Conus medullaris and cauda equina: Conus extends to the L1 level.
Conus and cauda equina appear normal.

Paraspinal and other soft tissues: Left renal cystic intensity.

Disc levels:

T12- L1: Unremarkable.

L1-L2: Unremarkable.

L2-L3: Unremarkable.

L3-L4: Disc bulging and height loss eccentric to the left where
there is also endplate degeneration and spurring. Mild facet
spurring asymmetric to the left. Increased left paracentral disc at
the subarticular recess with mild mass effect on the L4 nerve root.

L4-L5: Disc narrowing and endplate degeneration with bulging
eccentric to the left. Mild facet spurring.

L5-S1:Disc narrowing and bulging with mild facet spurring.
IMPRESSION: 1. Lumbar spine degeneration with scoliosis and L3-4 discogenic
edema.
2. L3-4 left paracentral protrusion since [TG] with mild mass effect
on the left L4 nerve root.
3. Diffusely patent central canal and foramina.

## 2020-10-29 ENCOUNTER — Other Ambulatory Visit: Payer: Self-pay

## 2020-10-29 ENCOUNTER — Ambulatory Visit (INDEPENDENT_AMBULATORY_CARE_PROVIDER_SITE_OTHER): Payer: Medicare HMO

## 2020-10-29 DIAGNOSIS — J309 Allergic rhinitis, unspecified: Secondary | ICD-10-CM | POA: Diagnosis not present

## 2020-10-30 ENCOUNTER — Ambulatory Visit: Payer: Medicare HMO | Admitting: Family Medicine

## 2020-10-30 ENCOUNTER — Encounter: Payer: Self-pay | Admitting: Family Medicine

## 2020-10-30 VITALS — BP 124/78 | HR 82 | Temp 97.3°F | Ht 64.0 in | Wt 132.2 lb

## 2020-10-30 DIAGNOSIS — Z853 Personal history of malignant neoplasm of breast: Secondary | ICD-10-CM

## 2020-10-30 DIAGNOSIS — M810 Age-related osteoporosis without current pathological fracture: Secondary | ICD-10-CM | POA: Diagnosis not present

## 2020-10-30 DIAGNOSIS — N898 Other specified noninflammatory disorders of vagina: Secondary | ICD-10-CM | POA: Diagnosis not present

## 2020-10-30 DIAGNOSIS — D229 Melanocytic nevi, unspecified: Secondary | ICD-10-CM | POA: Diagnosis not present

## 2020-10-30 DIAGNOSIS — R194 Change in bowel habit: Secondary | ICD-10-CM

## 2020-10-30 DIAGNOSIS — R140 Abdominal distension (gaseous): Secondary | ICD-10-CM | POA: Diagnosis not present

## 2020-10-30 DIAGNOSIS — K582 Mixed irritable bowel syndrome: Secondary | ICD-10-CM

## 2020-10-30 MED ORDER — DENOSUMAB 60 MG/ML ~~LOC~~ SOSY
60.0000 mg | PREFILLED_SYRINGE | Freq: Once | SUBCUTANEOUS | Status: AC
  Administered 2020-10-30: 60 mg via SUBCUTANEOUS

## 2020-10-30 NOTE — Progress Notes (Signed)
Subjective  CC:  Chief Complaint  Patient presents with   Vaginal Discharge   Nevus    On Back    Bloated    HPI: Julia [name] is a 80 y.o. female who presents to the office today to address the problems listed above in the chief complaint. 80 year old with history of breast cancer who has been off tamoxifen for several months with history of vaginal discharge related to tamoxifen.  She presents due to persistent thick clear vaginal discharge.  No change since coming off tamoxifen.  No STD risk.  No pelvic pain.  No vaginal bleeding. Complains of abdominal bloating, intermittently.  Has IBS.  No constipation currently.  Appetite is good. No diarrhea or blood in stool PT noted "bad" moles on back. Wants them checked. Can't see them Due for prolia for osteoporosis.   Assessment  1. Frequent bowel movements   2. Atypical mole   3. Vaginal discharge   4. Bloating   5. History of breast cancer   6. Age-related osteoporosis without current pathological fracture   7. Irritable bowel syndrome with both constipation and diarrhea      Plan  IBS:  education. Intermittent immodium if needed. No red flags . Vag d/c: due to ho tamoxifen and atrophic vaginitis. Tolerable. Avoid local estrogens due to h/o breast cancer.  Atypical moles. Will refer to derm for skin check. Prolia given today for osteoprorosis. Repeat in 6 months. Nl calcium and vit d by most recent labs.   Follow up:  3 mo for cpe Visit date not found  Orders Placed This Encounter  Procedures   Ambulatory referral to Dermatology    Meds ordered this encounter  Medications   denosumab (PROLIA) injection 60 mg    Order Specific Question:   Patient is enrolled in REMS program for this medication and I have provided a copy of the Prolia Medication Guide and Patient Brochure.    Answer:   Yes    Order Specific Question:   I have reviewed with the patient the information in the Prolia Medication Guide and Patient Counseling  Chart including the serious risks of Prolia and symptoms of each risk.    Answer:   Yes    Order Specific Question:   I have advised the patient to seek medical attention if they have signs or symptoms of any of the serious risks.    Answer:   Yes       I reviewed the patients updated PMH, FH, and SocHx.    Patient Active Problem List   Diagnosis Date Noted   Statin intolerance 10/13/2018    Priority: High   Parkinson's disease ([place]) 09/29/2017    Priority: High   Age-related osteoporosis without current pathological fracture 07/20/2016    Priority: High   Mixed hyperlipidemia 03/07/2015    Priority: High   Impaired fasting glucose 03/07/2015    Priority: High   History of colon cancer 02/13/2015    Priority: High   Hypothyroidism due to Hashimoto's thyroiditis 02/13/2015    Priority: High   History of breast cancer left, 2016 02/13/2015    Priority: High   Multiple pulmonary nodules 06/20/2019    Priority: Medium   RLS (restless legs syndrome) 07/14/2018    Priority: Medium   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    Priority: Medium   Anxiety 09/29/2017    Priority: Medium   Dysfunctional voiding of urine 09/13/2016    Priority: Medium   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    Priority: Medium   Gastroesophageal reflux disease 09/13/2016    Priority: Medium   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    Priority: Medium   Irritable bowel syndrome with both constipation and diarrhea 02/13/2015    Priority: Medium   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   Carbidopa-Levodopa ER (SINEMET CR) 25-100 MG tablet controlled release Take 1 tablet by mouth in the morning, at noon, and at bedtime.   denosumab (PROLIA) 60 MG/ML SOSY injection Inject 60 mg into the skin every 6 (six) months.   diphenhydrAMINE (BENADRYL) 25 MG tablet Take 25 mg by mouth every 6 (six) hours as needed.   EPINEPHrine 0.3 mg/0.3 mL IJ SOAJ injection INJECT INTO THE MIDDLE OF THE OUTER THIGH AND HOLD FOR 3 SECONDS AS NEEDED FOR SEVERE ALLERGIC REACTION THEN CALL 911 IF USED.   fluticasone (FLONASE) 50 MCG/ACT nasal spray PLACE 2 SPRAYS IN EACH NOSTRIL DAILY AS NEEDED FOR ALLERGIES OR RHINITIS   levothyroxine (SYNTHROID) 88 MCG tablet TAKE ONE TABLET BY MOUTH DAILY FOR BREAKFAST   Melatonin 1 MG CAPS Take by mouth as needed.   Multiple Vitamins-Minerals (CENTRUM SILVER PO) Take by mouth.   NON FORMULARY [facility]  Creams-#11 Peripheral Neuropathy cream   pramipexole (MIRAPEX) 0.25 MG tablet Take 1 tablet (0.25 mg total) by mouth at bedtime.   Probiotic Product (PROBIOTIC DAILY PO) Take by mouth.   UNABLE TO FIND Med Name: Allergy shots 1/week   [DISCONTINUED] sulfamethoxazole-trimethoprim (BACTRIM DS) 800-160 MG tablet Take 1 tablet by mouth 2 (two) times daily.   [DISCONTINUED] tamoxifen (NOLVADEX) 20 MG tablet TAKE ONE TABLET BY MOUTH DAILY    Allergies: Patient is allergic to cefdinir, cholestyramine, contrast media [iodinated diagnostic agents], ezetimibe, meperidine, nitrofurantoin, statins, and teriparatide. Family History: Patient family history includes Arthritis in her father, mother, and sister; Cancer in her brother, father, sister, sister, and sister; Depression in her father; Diabetes in her mother and sister; Heart disease in her mother; Hyperlipidemia in her brother and sister; Hypertension in  her mother; Osteoporosis in her father. Social History:  Patient  reports that she has never smoked. She has never used smokeless tobacco. She reports current alcohol use. She reports that she does not use drugs.  Review of Systems: Constitutional: Negative for fever malaise or anorexia Cardiovascular: negative for chest pain Respiratory: negative for SOB or persistent cough Gastrointestinal: negative for abdominal pain  Objective  Vitals: BP 124/78   Pulse 82   Temp (!) 97.3 F (36.3 C)   Ht '5\' 4"'$  (1.626 m)   Wt 132 lb 4 oz (60 kg)   SpO2 98%   BMI 22.70 kg/m  General: no acute distress , A&Ox3 HEENT: PEERL, conjunctiva normal, neck is supple Cardiovascular:  RRR without murmur or gallop.  Respiratory:  Good breath sounds bilaterally, CTAB with normal respiratory effort Skin:  Warm, no rashes sebks on back. Two slighlty irreg moles with variation in color on back Gastrointestinal: soft, flat abdomen, normal active bowel sounds, no palpable masses, no hepatosplenomegaly, no appreciated hernias     Commons side effects, risks, benefits, and alternatives for medications and treatment plan prescribed today were discussed, and the patient expressed understanding of the given instructions. Patient is instructed to call or message via MyChart if he/she has any questions or concerns regarding our treatment plan. No barriers to understanding were identified. We discussed Red Flag symptoms and signs in detail. Patient expressed understanding regarding what to do in case of urgent or emergency type symptoms.  Medication list was reconciled, printed and provided to the patient in AVS. Patient instructions and summary information was reviewed with the patient as documented in the AVS. This note was prepared with assistance of Dragon voice recognition software. Occasional wrong-word or sound-a-like substitutions may have occurred due to the inherent limitations of voice recognition  software  This visit occurred during the SARS-CoV-2 public health emergency.  Safety protocols were in place, including screening questions prior to the visit, additional usage of staff PPE, and extensive cleaning of exam room while observing appropriate contact time as indicated for disinfecting solutions.

## 2020-10-30 NOTE — Patient Instructions (Signed)
Please return in 3 months for your annual complete physical; please come fasting.   We will get you in to see a new dermatologist; it may take 3-4 months.   If you have any questions or concerns, please don't hesitate to send me a message via MyChart or call the office at [phone]. Thank you for visiting with [place] today! It's our pleasure caring for you.

## 2020-11-02 ENCOUNTER — Telehealth: Payer: Self-pay | Admitting: Allergy

## 2020-11-04 MED ORDER — FLUTICASONE PROPIONATE 50 MCG/ACT NA SUSP
NASAL | 0 refills | Status: DC
Start: 2020-11-04 — End: 2020-11-19

## 2020-11-04 NOTE — Telephone Encounter (Signed)
Patient states that the refill request for Flonase was denied, but she has an appointment scheduled on 11/19/2020. Patient would like a courtesy refill sent into [facility] on [address].  Please advise.

## 2020-11-04 NOTE — Telephone Encounter (Signed)
[facility] refill sent in for pt

## 2020-11-04 NOTE — Addendum Note (Signed)
Addended by: Felipa Emory on: 11/04/2020 05:13 PM   Modules accepted: Orders

## 2020-11-08 ENCOUNTER — Other Ambulatory Visit: Payer: Self-pay | Admitting: Neurology

## 2020-11-11 ENCOUNTER — Encounter: Payer: Self-pay | Admitting: Family Medicine

## 2020-11-12 ENCOUNTER — Ambulatory Visit: Payer: Medicare HMO | Admitting: Allergy

## 2020-11-15 ENCOUNTER — Ambulatory Visit: Payer: Medicare HMO | Admitting: Neurology

## 2020-11-15 ENCOUNTER — Encounter: Payer: Self-pay | Admitting: Neurology

## 2020-11-15 VITALS — BP 146/91 | HR 86 | Ht 63.75 in | Wt 134.2 lb

## 2020-11-15 DIAGNOSIS — G2581 Restless legs syndrome: Secondary | ICD-10-CM | POA: Diagnosis not present

## 2020-11-15 DIAGNOSIS — G4752 REM sleep behavior disorder: Secondary | ICD-10-CM | POA: Diagnosis not present

## 2020-11-15 DIAGNOSIS — G20 Parkinson's disease: Secondary | ICD-10-CM | POA: Diagnosis not present

## 2020-11-15 MED ORDER — CARBIDOPA-LEVODOPA ER 50-200 MG PO TBCR: 1.0000 | EXTENDED_RELEASE_TABLET | Freq: Three times a day (TID) | ORAL | 1 refills | Status: DC

## 2020-11-15 NOTE — Patient Instructions (Signed)
We will change the Sinemet from the 25/100 tablet to 50/200 mg tablets, take one tablet three times a day.  Sinemet (carbidopa) may result in confusion or hallucinations, drowsiness, nausea, or dizziness. If any significant side effects are noted, please contact our office. Sinemet may not be well absorbed when taken with high protein meals, if tolerated it is best to take 30-45 minutes before you eat.

## 2020-11-15 NOTE — Progress Notes (Signed)
Reason for visit: Parkinson's disease  Julia [name] is an 80 y.o. female  History of present illness:  Julia [name] is a 80 year old right-handed white female with a history of Parkinson's disease and low back pain.  The patient has a lot of arthritic issues, she wakes up in the morning feeling quite stiff but feels much better when she gets up and moves around.  She has restless leg syndrome at night, but she has gained good improvement with Mirapex.  She is on Sinemet CR taking the 25/100 mg tablets, 1 tablet 4 times daily.  She takes her medications at 7 AM, 11 AM, 3 PM, and 7 PM.  She is still noting some wearing off effect about a half an hour prior to the next dose of her medication.  In the middle the night, she will wake up, and her arm tremors will keep her from getting back to sleep.  She has chronic issues with constipation, she takes milk of magnesia, she has been on FiberCon but still has some issues.  In the past, she could not tolerate magnesium supplementation secondary to diarrhea.  The patient is remaining active, she will walk at least a mile a day.  She has noted some emotional instability that she attributes to her medication.  She does have some gait instability, she denies any falls but will stumble on occasion.  She denies any issues of significance with swallowing.  She has had some sciatica type pain down the left leg, she is followed through orthopedic surgery and may end up getting an epidural steroid injection in the near future.  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[place])     Past Surgical History:  Procedure Laterality Date   ADENOIDECTOMY     BREAST LUMPECTOMY Left     radiation only   BUNIONECTOMY     COLON SURGERY     HEMORRHOID SURGERY     LAPAROSCOPY     NASAL SINUS SURGERY     OOPHORECTOMY     TONSILLECTOMY     TUBAL LIGATION      Family History  Problem Relation Age of Onset   Arthritis Mother    Diabetes Mother    Heart disease Mother    Hypertension Mother    Arthritis Father    Cancer Father    Depression Father    Osteoporosis Father    Arthritis Sister    Cancer Sister    Diabetes Sister    Cancer Brother    Cancer Sister    Cancer Sister    Hyperlipidemia Sister    Hyperlipidemia Brother     Social history:  reports that she has never smoked. She has never used smokeless tobacco. She reports current alcohol use. She reports that she does not use drugs.    Allergies  Allergen Reactions   Cefdinir    Cholestyramine     GERD   Contrast Media [Iodinated Diagnostic Agents] Diarrhea   Ezetimibe    Meperidine    Nitrofurantoin Diarrhea   Statins    Teriparatide     Medications:  Prior to Admission medications   Medication Sig Start  Date End Date Taking? Authorizing Provider  Acetaminophen (TYLENOL PO) Take by mouth as needed.   Yes [provider]  Calcium Citrate-Vitamin D 315-250 MG-UNIT TABS Take by mouth.   Yes [provider]  Carbidopa-Levodopa ER (SINEMET CR) 25-100 MG tablet controlled release Take 1 tablet by mouth in the morning, at noon, and at bedtime. 06/13/20  Yes Kathrynn Ducking, MD  denosumab (PROLIA) 60 MG/ML SOSY injection Inject 60 mg into the skin every 6 (six) months.   Yes [provider]  diphenhydrAMINE (BENADRYL) 12.5 MG chewable tablet Chew 12.5 mg by mouth every 6 (six) hours as needed.   Yes [provider]  EPINEPHrine 0.3 mg/0.3 mL IJ SOAJ injection INJECT INTO THE MIDDLE OF THE OUTER THIGH AND HOLD FOR 3 SECONDS AS NEEDED FOR SEVERE ALLERGIC REACTION THEN CALL 911 IF USED. 07/04/20  Yes Garnet Sierras, DO  fluticasone (FLONASE) 50 MCG/ACT nasal spray PLACE 2  SPRAYS IN EACH NOSTRIL DAILY AS NEEDED FOR ALLERGIES OR RHINITIS 11/04/20  Yes Garnet Sierras, DO  levothyroxine (SYNTHROID) 88 MCG tablet TAKE ONE TABLET BY MOUTH DAILY FOR BREAKFAST 10/08/20  Yes Leamon Arnt, MD  Melatonin 1 MG CAPS Take by mouth as needed.   Yes [provider]  Multiple Vitamins-Minerals (CENTRUM SILVER PO) Take by mouth.   Yes [provider]  naproxen sodium (ALEVE) 220 MG tablet Take 220 mg by mouth as needed.   Yes [provider]  NON [place]  Creams-#11 Peripheral Neuropathy cream   Yes [provider]  Polyethyl Glycol-Propyl Glycol (SYSTANE ULTRA OP) Apply to eye.   Yes [provider]  pramipexole (MIRAPEX) 0.25 MG tablet TAKE 1 TABLET BY MOUTH AT BEDTIME 11/11/20  Yes Kathrynn Ducking, MD  Probiotic Product (PROBIOTIC DAILY PO) Take by mouth.   Yes [provider]  SALINE NASAL MIST NA Place into the nose.   Yes [provider]  UNABLE TO FIND Med Name: Allergy shots 1/week   Yes [provider]    ROS:  Out of a complete 14 system review of symptoms, the patient complains only of the following symptoms, and all other reviewed systems are negative.  Back pain, left leg pain Tremor Walking difficulty  Blood pressure (!) 146/91, pulse 86, height 5' 3.75" (1.619 m), weight 134 lb 3.2 oz (60.9 kg).  Physical Exam  General: The patient is alert and cooperative at the time of the examination.  Skin: No significant peripheral edema is noted.   Neurologic Exam  Mental status: The patient is alert and oriented x 3 at the time of the examination. The patient has apparent normal recent and remote memory, with an apparently normal attention span and concentration ability.   Cranial nerves: Facial symmetry is present. Speech is normal, no aphasia or dysarthria is noted. Extraocular movements are full. Visual fields are full.  Masking of the face is seen.  Motor: The  patient has good strength in all 4 extremities.  Sensory examination: Soft touch sensation is symmetric on the face, arms, and legs.  Coordination: The patient has good finger-nose-finger and heel-to-shin bilaterally.  Gait and station: The patient is able to arise from a seated position with arms crossed.  Once up, she is able to ambulate well without assistance, she has good stride and good turns.  She does have some arm swing bilaterally, slightly reduced on the right.  Occasionally, resting tremors noted on the right arm.  Tandem gait is  somewhat unsteady.  Romberg is negative.  Reflexes: Deep tendon reflexes are symmetric.   MRI lumbar 10/29/20:  Impression:  1.  Lumbar spine degeneration with scoliosis and L3-4 discogenic edema. 2.  L3-4 left paracentral protrusion since 2021 with mild mass-effect on the left L4 nerve root. 3.  Diffusely patent central canal and foramina.   Assessment/Plan:  1.  Parkinson's disease  2.  Low back pain, left leg pain  3.  Chronic constipation  4.  Restless leg syndrome  The patient continues to have wearing off issues with her medication, we will go up on her total daily Sinemet dose, she will be converted to the Sinemet 50/200 mg CR tablets taking 1 tablet 3 times daily.  She will continue the Mirapex at night for restless leg syndrome.  She will begin MiraLAX daily for her constipation issues.  She will follow-up here in 5 months, she may be seen by Dr. Rexene Alberts in the future.  Jill Alexanders MD 11/15/2020 10:59 AM  [facility] [address] [place], [place] [address]  Phone [phone] Fax [phone]

## 2020-11-18 ENCOUNTER — Telehealth: Payer: Self-pay | Admitting: Neurology

## 2020-11-18 NOTE — Telephone Encounter (Signed)
Pt called stating she has some questions about the side affects of the carbidopa-levodopa (SINEMET CR) 50-200 MG tablet. She states she has had 7 BM today and also which 3 times during the day should she take the medication. States she is very jittery. Pt requesting a call back.

## 2020-11-18 NOTE — Telephone Encounter (Signed)
I called the patient.  The patient was switched to the Sinemet CR 50/200 mg tablets from the 25-100 CR tablets.  The patient has noted some increased bowel movement frequency.  I will have her change to the 25/100 mg CR tablets taking 2 tablets 3 times daily to see if this improves her symptoms, it is possible that she may be sensitive to a filler in the 50/200 mg CR tablet.

## 2020-11-19 ENCOUNTER — Ambulatory Visit: Payer: Medicare HMO | Admitting: Allergy

## 2020-11-19 ENCOUNTER — Encounter: Payer: Self-pay | Admitting: Allergy

## 2020-11-19 ENCOUNTER — Other Ambulatory Visit: Payer: Self-pay

## 2020-11-19 VITALS — BP 122/78 | HR 85 | Temp 97.0°F | Resp 20 | Ht 64.0 in | Wt 132.0 lb

## 2020-11-19 DIAGNOSIS — J3089 Other allergic rhinitis: Secondary | ICD-10-CM

## 2020-11-19 DIAGNOSIS — J309 Allergic rhinitis, unspecified: Secondary | ICD-10-CM | POA: Diagnosis not present

## 2020-11-19 DIAGNOSIS — R0609 Other forms of dyspnea: Secondary | ICD-10-CM

## 2020-11-19 DIAGNOSIS — R0600 Dyspnea, unspecified: Secondary | ICD-10-CM | POA: Diagnosis not present

## 2020-11-19 DIAGNOSIS — Z8709 Personal history of other diseases of the respiratory system: Secondary | ICD-10-CM | POA: Diagnosis not present

## 2020-11-19 DIAGNOSIS — J302 Other seasonal allergic rhinitis: Secondary | ICD-10-CM

## 2020-11-19 MED ORDER — FLUTICASONE PROPIONATE 50 MCG/ACT NA SUSP: NASAL | 3 refills | Status: DC

## 2020-11-19 NOTE — Patient Instructions (Addendum)
Allergic rhinitis Continue environmental control measures.  Continue allergy injections - given today.  Use Flonase (fluticasone) nasal spray 1-2 sprays per nostril once a day as needed for nasal congestion.  Try to use 1 spray per nostril instead of 2 sprays and see if you notice any difference.  Continue saline nasal spray as needed for nasal symptoms.  May use fexofenadine 180 mg once a day as needed for runny nose.   History of nasal polyp Continue fluticasone nasal spray 1-2 sprays each nostril once a day. ENT - Dr. Benjamine Mola Phone: [phone]  Dyspnea with exertion Continue to monitor symptoms.   Follow up in 1 year or sooner if needed.

## 2020-11-19 NOTE — Assessment & Plan Note (Signed)
Previous history - 2019 skin testing positive to tree pollen, molds and dust mite. Interim history - tried azelastine nasal spray with no benefit.  Still having some rhinorrhea.  Doing well with allergy injections. Has dry mouth.  . Continue environmental control measures.  . Continue allergy injections - given today.  . Use Flonase (fluticasone) nasal spray 1-2 sprays per nostril once a day as needed for nasal congestion.  o Try to use 1 spray per nostril instead of 2 sprays and see if you notice any difference.  . Continue saline nasal spray as needed for nasal symptoms.  . May use fexofenadine 180 mg once a day as needed for runny nose.  . Patient declines Atrovent nasal spray for rhinorrhea.

## 2020-11-19 NOTE — Assessment & Plan Note (Addendum)
Past history - at age 80 was diagnosed with pneumonia and pleurisy. Has had shortness of breath with exertion since she was in her 20's. Has seen 3 pulmonologist. Two of the pulmonologist's instructed her that scar tissue was the cause of her dyspnea. 2021 spirometry was normal. Interim history - unchanged.  Continue to monitor symptoms.  

## 2020-11-19 NOTE — Progress Notes (Signed)
Follow Up Note  RE: Julia [name] MRN: [id number] DOB: October 21, 1940 Date of Office Visit: 11/19/2020  Referring provider: Leamon Arnt, MD Primary care provider: Leamon Arnt, MD  Chief Complaint: Follow-up (Pt reports she have notice overall allergies symptoms have improved.) and Allergic Rhinitis   History of Present Illness: I had the pleasure of seeing Julia [name] for a follow up visit at the [facility] on 11/19/2020. She is a 80 y.o. female, who is being followed for allergic rhinitis on AIT, history of nasal polyps, dyspnea on exertion. Her previous allergy office visit was on 09/26/2019 with Dr. Maudie Mercury. Today is a regular follow up visit.  Perennial and seasonal allergic rhinitis Still has some rhinorrhea at times - especially with eating.  Tried azelastine 1 spray per nostril with no benefit so she stopped.  Currently on allergy injections every 3 weeks with good benefit. Notices symptoms when due for injections.  Flonase 2 sprays per nostril daily with no nosebleeds. Recent eye exam unremarkable. Patient does have some dry mouth issues.  No antihistamines use.  Epipen is up to date.   History of nasal polyp No increased nasal congestion but has issues with left sided nasal congestion.    Dyspnea on exertion Unchanged  Denies any ER/urgent care visits or prednisone use since the last visit.  Assessment and Plan: Julia [name] is a 80 y.o. female with: Seasonal and perennial allergic rhinitis Previous history - 2019 skin testing positive to tree pollen, molds and dust mite. Interim history - tried azelastine nasal spray with no benefit.  Still having some rhinorrhea.  Doing well with allergy injections. Has dry mouth.  Continue environmental control measures.  Continue allergy injections - given today.  Use Flonase (fluticasone) nasal spray 1-2 sprays per nostril once a day as needed for nasal congestion.  Try to use 1 spray per nostril instead of 2 sprays  and see if you notice any difference.  Continue saline nasal spray as needed for nasal symptoms.  May use fexofenadine 180 mg once a day as needed for runny nose.  Patient declines Atrovent nasal spray for rhinorrhea.  History of nasal polyp Previous history-  s/p polypectomy 1970's Interim history - nasal congestion on the left side.  Continue fluticasone nasal spray 1-2 sprays each nostril once a day. If no improvement recommend ENT evaluation next.  Dyspnea on exertion Past history - at age 89 was diagnosed with pneumonia and pleurisy. Has had shortness of breath with exertion since she was in her 88's. Has seen 3 pulmonologist. Two of the pulmonologist's instructed her that scar tissue was the cause of her dyspnea. 2021 spirometry was normal. Interim history - unchanged.  Continue to monitor symptoms.   Return in about 1 year (around 11/19/2021).  Meds ordered this encounter  Medications   fluticasone (FLONASE) 50 MCG/ACT nasal spray    Sig: PLACE 2 SPRAYS IN EACH NOSTRIL DAILY AS NEEDED FOR ALLERGIES OR RHINITIS    Dispense:  48 g    Refill:  3    Lab Orders  No laboratory test(s) ordered today    Diagnostics: None.  Medication List:  Current Outpatient Medications  Medication Sig Dispense Refill   Acetaminophen (TYLENOL PO) Take by mouth as needed.     Calcium Citrate-Vitamin D 315-250 MG-UNIT TABS Take by mouth.     denosumab (PROLIA) 60 MG/ML SOSY injection Inject 60 mg into the skin every 6 (six) months.     diphenhydrAMINE (BENADRYL)  12.5 MG chewable tablet Chew 12.5 mg by mouth every 6 (six) hours as needed.     EPINEPHrine 0.3 mg/0.3 mL IJ SOAJ injection INJECT INTO THE MIDDLE OF THE OUTER THIGH AND HOLD FOR 3 SECONDS AS NEEDED FOR SEVERE ALLERGIC REACTION THEN CALL 911 IF USED. 2 each 1   levothyroxine (SYNTHROID) 88 MCG tablet TAKE ONE TABLET BY MOUTH DAILY FOR BREAKFAST 90 tablet 0   Melatonin 1 MG CAPS Take by mouth as needed.     Multiple Vitamins-Minerals  (CENTRUM SILVER PO) Take by mouth.     naproxen sodium (ALEVE) 220 MG tablet Take 220 mg by mouth as needed.     NON FORMULARY [facility]  Creams-#11 Peripheral Neuropathy cream     Polyethyl Glycol-Propyl Glycol (SYSTANE ULTRA OP) Apply to eye.     pramipexole (MIRAPEX) 0.25 MG tablet TAKE 1 TABLET BY MOUTH AT BEDTIME 90 tablet 1   Probiotic Product (PROBIOTIC DAILY PO) Take by mouth.     SALINE NASAL MIST NA Place into the nose.     UNABLE TO FIND Med Name: Allergy shots 1/week     carbidopa-levodopa (SINEMET CR) 50-200 MG tablet Take 1 tablet by mouth 3 (three) times daily. (Patient not taking: Reported on 11/19/2020) 270 tablet 1   fluticasone (FLONASE) 50 MCG/ACT nasal spray PLACE 2 SPRAYS IN EACH NOSTRIL DAILY AS NEEDED FOR ALLERGIES OR RHINITIS 48 g 3   No current facility-administered medications for this visit.   Allergies: Allergies  Allergen Reactions   Cefdinir    Cholestyramine     GERD   Contrast Media [Iodinated Diagnostic Agents] Diarrhea   Ezetimibe    Meperidine    Nitrofurantoin Diarrhea   Statins    Teriparatide    I reviewed her past medical history, social history, family history, and environmental history and no significant changes have been reported from her previous visit.  Review of Systems  Constitutional:  Negative for appetite change, fever and unexpected weight change.  HENT:  Positive for rhinorrhea. Negative for congestion and postnasal drip.   Eyes:  Negative for itching.  Respiratory:  Negative for cough, chest tightness and wheezing.        Unchanged dyspnea on exertion.  Cardiovascular:  Negative for chest pain and palpitations.  Gastrointestinal:  Negative for abdominal pain.  Genitourinary:  Negative for difficulty urinating.  Skin:  Negative for rash.  Allergic/Immunologic: Positive for environmental allergies.  Neurological:  Negative for headaches.   Objective: BP 122/78   Pulse 85   Temp (!) 97 F (36.1 C) (Temporal)    Resp 20   Ht '5\' 4"'$  (1.626 m)   Wt 132 lb (59.9 kg)   SpO2 97%   BMI 22.66 kg/m  Body mass index is 22.66 kg/m. Physical Exam Vitals and nursing note reviewed.  Constitutional:      Appearance: Normal appearance. She is well-developed.  HENT:     Head: Normocephalic and atraumatic.     Right Ear: Tympanic membrane and external ear normal.     Left Ear: Tympanic membrane and external ear normal.     Nose: Nose normal.     Mouth/Throat:     Mouth: Mucous membranes are moist.     Pharynx: Oropharynx is clear.  Eyes:     Conjunctiva/sclera: Conjunctivae normal.  Cardiovascular:     Rate and Rhythm: Normal rate and regular rhythm.     Heart sounds: Normal heart sounds. No murmur heard. Pulmonary:     Effort: Pulmonary  effort is normal.     Breath sounds: Normal breath sounds. No wheezing, rhonchi or rales.  Musculoskeletal:     Cervical back: Neck supple.  Skin:    General: Skin is warm.     Findings: No rash.  Neurological:     Mental Status: She is alert and oriented to person, place, and time.  Psychiatric:        Behavior: Behavior normal.   Previous notes and tests were reviewed. The plan was reviewed with the patient/family, and all questions/concerned were addressed.  It was my pleasure to see Julia [name] today and participate in her care. Please feel free to contact me with any questions or concerns.  Sincerely,  Rexene Alberts, DO Allergy & Immunology  [facility] office: [place] office: [phone]

## 2020-11-19 NOTE — Assessment & Plan Note (Signed)
Previous history-  s/p polypectomy 1970's Interim history - nasal congestion on the left side.  . Continue fluticasone nasal spray 1-2 sprays each nostril once a day. . If no improvement recommend ENT evaluation next.

## 2020-11-19 NOTE — Telephone Encounter (Signed)
Pt states that when she took the 50/200 she had to urinate multiple times in the middle of the night and she would like to know if this medication or the pramipexole (MIRAPEX) 0.25 MG tablet is what caused it. She would like to be advised which dose should she take because they both caused a reaction to her. Please advise.

## 2020-11-19 NOTE — Telephone Encounter (Signed)
I tried to call the patient.  Unable to reach her, I will try again later.

## 2020-11-20 NOTE — Telephone Encounter (Signed)
I called the patient.  I am unaware that Sinemet is a cause of urinary frequency, if she is having troubles in this regard, I can bring her in for urinalysis to make sure she does not have a bladder infection.  She is also having some nausea on the increased dose of the Sinemet, she is to take the medication about an hour after eating instead of on an empty stomach.  We will monitor on this dose.  She is concerned about the pramipexole resulting in symptoms, we do not change the dose of the medication, but previously she was only taking half of a tablet at night and a week prior to me seeing her in the office she increased it to a full tablet at night.

## 2020-12-09 ENCOUNTER — Ambulatory Visit: Payer: Medicare HMO | Admitting: Podiatry

## 2020-12-09 ENCOUNTER — Encounter: Payer: Self-pay | Admitting: Podiatry

## 2020-12-09 ENCOUNTER — Other Ambulatory Visit: Payer: Self-pay

## 2020-12-09 DIAGNOSIS — B351 Tinea unguium: Secondary | ICD-10-CM | POA: Diagnosis not present

## 2020-12-09 DIAGNOSIS — G629 Polyneuropathy, unspecified: Secondary | ICD-10-CM | POA: Diagnosis not present

## 2020-12-09 DIAGNOSIS — L821 Other seborrheic keratosis: Secondary | ICD-10-CM | POA: Insufficient documentation

## 2020-12-09 DIAGNOSIS — M79675 Pain in left toe(s): Secondary | ICD-10-CM | POA: Diagnosis not present

## 2020-12-09 DIAGNOSIS — M79674 Pain in right toe(s): Secondary | ICD-10-CM

## 2020-12-09 NOTE — Progress Notes (Signed)
Complaint:  Visit Type: Patient returns to my office for continued preventative foot care services. Complaint: Patient states" my nails have grown long and thick and become painful to walk and wear shoes" Patient has been diagnosed with Parkinsons  with no foot complications.  Patient has history of bunion surgery both big toe joints.  She also has history of nail removal second toe right foot. The patient presents for preventative foot care services.  Podiatric Exam: Vascular: dorsalis pedis and posterior tibial pulses are palpable bilateral. Capillary return is immediate. Temperature gradient is WNL. Skin turgor WNL  Sensorium: Normal Semmes Weinstein monofilament test. Normal tactile sensation bilaterally. Nail Exam: Pt has thick disfigured discolored nails with subungual debris noted bilateral entire nail hallux through fifth toenails with exception second toenail right foot. Ulcer Exam: There is no evidence of ulcer or pre-ulcerative changes or infection. Orthopedic Exam: Muscle tone and strength are WNL. No limitations in general ROM. No crepitus or effusions noted. Foot type and digits show no abnormalities. Bony prominences are unremarkable. Skin: No Porokeratosis. No infection or ulcers.  Asymptomatic callus sub 1st MPJ left foot.  Diagnosis:  Onychomycosis, , Pain in right toe, pain in left toes  Treatment & Plan Procedures and Treatment: Consent by patient was obtained for treatment procedures.   Debridement of mycotic and hypertrophic toenails, 1 through 5 bilateral and clearing of subungual debris. No ulceration, no infection noted. Patient was told to check with her medical doctor for neuropathy pain. Return Visit-Office Procedure: Patient instructed to return to the office for a follow up visit 3 months for continued evaluation and treatment.    Gardiner Barefoot DPM

## 2020-12-10 ENCOUNTER — Ambulatory Visit (INDEPENDENT_AMBULATORY_CARE_PROVIDER_SITE_OTHER): Payer: Medicare HMO

## 2020-12-10 DIAGNOSIS — J309 Allergic rhinitis, unspecified: Secondary | ICD-10-CM | POA: Diagnosis not present

## 2020-12-31 ENCOUNTER — Ambulatory Visit (INDEPENDENT_AMBULATORY_CARE_PROVIDER_SITE_OTHER): Payer: Medicare HMO

## 2020-12-31 ENCOUNTER — Other Ambulatory Visit: Payer: Self-pay

## 2020-12-31 DIAGNOSIS — J309 Allergic rhinitis, unspecified: Secondary | ICD-10-CM | POA: Diagnosis not present

## 2021-01-03 ENCOUNTER — Other Ambulatory Visit: Payer: Self-pay | Admitting: Family Medicine

## 2021-01-03 DIAGNOSIS — J3089 Other allergic rhinitis: Secondary | ICD-10-CM

## 2021-01-03 NOTE — Progress Notes (Signed)
EXP 01/03/22

## 2021-01-06 DIAGNOSIS — J302 Other seasonal allergic rhinitis: Secondary | ICD-10-CM | POA: Diagnosis not present

## 2021-01-08 NOTE — Telephone Encounter (Signed)
Copied from [place] [phone]. Topic: Medicare AWV >> Jan 08, 2021 11:16 AM Harris-Coley, Hannah Beat wrote: Reason for CRM: Left message for patient to schedule Annual Wellness Visit.  Please schedule with Nurse Health Advisor Charlott Rakes, RN at [facility].  Please call [phone] ask for [facility]

## 2021-01-21 ENCOUNTER — Ambulatory Visit (INDEPENDENT_AMBULATORY_CARE_PROVIDER_SITE_OTHER): Payer: Medicare HMO

## 2021-01-21 ENCOUNTER — Other Ambulatory Visit: Payer: Self-pay

## 2021-01-21 DIAGNOSIS — J309 Allergic rhinitis, unspecified: Secondary | ICD-10-CM

## 2021-01-27 ENCOUNTER — Other Ambulatory Visit: Payer: Self-pay

## 2021-01-27 ENCOUNTER — Ambulatory Visit (INDEPENDENT_AMBULATORY_CARE_PROVIDER_SITE_OTHER): Payer: Medicare HMO | Admitting: Family

## 2021-01-27 ENCOUNTER — Ambulatory Visit: Payer: Medicare HMO | Admitting: Adult Health

## 2021-01-27 ENCOUNTER — Encounter: Payer: Self-pay | Admitting: Family

## 2021-01-27 VITALS — BP 122/80 | HR 92 | Temp 98.0°F | Wt 134.0 lb

## 2021-01-27 DIAGNOSIS — N3090 Cystitis, unspecified without hematuria: Secondary | ICD-10-CM | POA: Diagnosis not present

## 2021-01-27 LAB — POCT URINALYSIS DIPSTICK
Bilirubin, UA: NEGATIVE
Blood, UA: POSITIVE
Glucose, UA: NEGATIVE
Ketones, UA: NEGATIVE
Nitrite, UA: NEGATIVE
Protein, UA: POSITIVE — AB
Spec Grav, UA: 1.015 (ref 1.010–1.025)
Urobilinogen, UA: 0.2 E.U./dL
pH, UA: 5.5 (ref 5.0–8.0)

## 2021-01-27 MED ORDER — SULFAMETHOXAZOLE-TRIMETHOPRIM 800-160 MG PO TABS: 1.0000 | ORAL_TABLET | Freq: Two times a day (BID) | ORAL | 0 refills | Status: DC

## 2021-01-27 NOTE — Patient Instructions (Addendum)
It was very nice to see you today!  As discussed, I have sent the antibiotic to your pharmacy, be sure to start today. Let us know if your symptoms have not resolved after finishing the medicine. Continue to drink at least 64oz of water daily!   PLEASE NOTE:  If you had any lab tests please let us know if you have not heard back within a few days. You may see your results on mychart before we have a chance to review them but we will give you a call once they are reviewed by [place]. If we ordered any referrals today, please let us know if you have not heard from their office within the next week.   Please try these tips to maintain a healthy lifestyle:  Eat most of your calories during the day when you are active. Eliminate processed foods including packaged sweets (pies, cakes, cookies), reduce intake of potatoes, white bread, white pasta, and white rice. Look for whole grain options, oat flour or almond flour.  Each meal should contain half fruits/vegetables, one quarter protein, and one quarter carbs (no bigger than a computer mouse).  Cut down on sweet beverages. This includes juice, soda, and sweet tea. Also watch fruit intake, though this is a healthier sweet option, it still contains natural sugar! Limit to 3 servings daily.  Drink at least 1 glass of water with each meal and aim for at least 8 glasses per day  Exercise at least 150 minutes every week.

## 2021-01-27 NOTE — Assessment & Plan Note (Signed)
Last UTI back in April, Bactrim got rid of it. Advised on use & SE, Drink at least 2 liters = 64oz = 8 cups of water daily.

## 2021-01-27 NOTE — Progress Notes (Signed)
Subjective:     Patient ID: Julia [name], female    DOB: December 05, 1940, 80 y.o.   MRN: [id number]  Chief Complaint  Patient presents with   Urinary Tract Infection    Symptoms started 10/28 Burning, frequency, low back pain States she knows when she has UTI because she can "feel it in her hands"    HPI Urinary Tract Infection: Patient complains of dysuria and bilateral flank pain She has had symptoms for 3 days. Patient also complains of  frequency & urgency . Patient denies  fever, nausea, foul odor . Patient does not have a history of recurrent UTI.  Patient does not have a history of pyelonephritis.    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     Calcium Citrate-Vitamin D 315-250 MG-UNIT TABS Take by mouth.     carbidopa-levodopa (SINEMET CR) 50-200 MG tablet Take 1 tablet by mouth 3 (three) times daily. 270 tablet 1   denosumab (PROLIA) 60 MG/ML SOSY injection Inject 60 mg into the skin every 6 (six) months.     diphenhydrAMINE (BENADRYL) 12.5 MG chewable tablet Chew 12.5 mg by mouth every 6 (six) hours as needed.     EPINEPHrine 0.3 mg/0.3 mL IJ SOAJ injection INJECT  INTO THE MIDDLE OF THE OUTER THIGH AND HOLD FOR 3 SECONDS AS NEEDED FOR SEVERE ALLERGIC REACTION THEN CALL 911 IF USED. 2 each 1   fluticasone (FLONASE) 50 MCG/ACT nasal spray PLACE 2 SPRAYS IN EACH NOSTRIL DAILY AS NEEDED FOR ALLERGIES OR RHINITIS 48 g 3   levothyroxine (SYNTHROID) 88 MCG tablet TAKE ONE TABLET BY MOUTH DAILY FOR BREAKFAST 90 tablet 0   Melatonin 1 MG CAPS Take by mouth as needed.     Multiple Vitamins-Minerals (CENTRUM SILVER PO) Take by mouth.     naproxen sodium (ALEVE) 220 MG tablet Take 220 mg by mouth as needed.     NON FORMULARY [facility]  Creams-#11 Peripheral Neuropathy cream     Polyethyl Glycol-Propyl Glycol (SYSTANE ULTRA OP) Apply to eye.     pramipexole (MIRAPEX) 0.25 MG tablet TAKE 1 TABLET BY MOUTH AT BEDTIME 90 tablet 1   Probiotic Product (PROBIOTIC DAILY PO) Take by mouth.     SALINE NASAL MIST NA Place into the nose.     UNABLE TO FIND Med Name: Allergy shots 1/week     No facility-administered medications prior to visit.    Allergies  Allergen Reactions   Cefdinir  Cholestyramine     GERD   Contrast Media [Iodinated Diagnostic Agents] Diarrhea   Ezetimibe    Meperidine    Nitrofurantoin Diarrhea   Statins    Teriparatide         Objective:    Physical Exam Vitals and nursing note reviewed.  Constitutional:      Appearance: Normal appearance.  Cardiovascular:     Rate and Rhythm: Normal rate and regular rhythm.  Pulmonary:     Effort: Pulmonary effort is normal.     Breath sounds: Normal breath sounds.  Musculoskeletal:        General: Normal range of motion.  Skin:    General: Skin is warm and dry.  Neurological:     Mental Status: She is alert.  Psychiatric:        Mood and Affect: Mood normal.        Behavior: Behavior normal.    BP 122/80   Pulse 92   Temp 98 F (36.7 C) (Oral)   Wt 134 lb (60.8 kg)   SpO2 95%   BMI 23.00 kg/m  Wt Readings from Last 3 Encounters:  01/27/21 134 lb (60.8 kg)  11/19/20  132 lb (59.9 kg)  11/15/20 134 lb 3.2 oz (60.9 kg)       Assessment & Plan:   Problem List Items Addressed This Visit       Genitourinary   Cystitis - Primary    Last UTI back in April, Bactrim got rid of it. Advised on use & SE, Drink at least 2 liters = 64oz = 8 cups of water daily.       Relevant Medications   sulfamethoxazole-trimethoprim (BACTRIM DS) 800-160 MG tablet   Other Relevant Orders   POCT Urinalysis Dipstick (Completed)   Urine Culture    Meds ordered this encounter  Medications   sulfamethoxazole-trimethoprim (BACTRIM DS) 800-160 MG tablet    Sig: Take 1 tablet by mouth 2 (two) times daily after a meal.    Dispense:  10 tablet    Refill:  0    Order Specific Question:   Supervising Provider    Answer:   ANDY, CAMILLE L [[id number]]

## 2021-01-29 LAB — URINE CULTURE
MICRO NUMBER:: 12572097
SPECIMEN QUALITY:: ADEQUATE

## 2021-02-03 ENCOUNTER — Telehealth: Payer: Self-pay

## 2021-02-03 NOTE — Telephone Encounter (Signed)
Pt called wanting to know if she can get her Prolia injection when she sees Dr Jonni Sanger on 11/9. Please Advise.

## 2021-02-04 NOTE — Telephone Encounter (Signed)
Spoke with patient, gave a verbal understanding °

## 2021-02-05 ENCOUNTER — Encounter: Payer: Self-pay | Admitting: Family Medicine

## 2021-02-05 ENCOUNTER — Other Ambulatory Visit: Payer: Self-pay

## 2021-02-05 ENCOUNTER — Ambulatory Visit (INDEPENDENT_AMBULATORY_CARE_PROVIDER_SITE_OTHER): Payer: Medicare HMO | Admitting: Family Medicine

## 2021-02-05 VITALS — BP 134/88 | HR 85 | Temp 97.8°F | Ht 64.0 in | Wt 134.0 lb

## 2021-02-05 DIAGNOSIS — C50912 Malignant neoplasm of unspecified site of left female breast: Secondary | ICD-10-CM | POA: Insufficient documentation

## 2021-02-05 DIAGNOSIS — Z0000 Encounter for general adult medical examination without abnormal findings: Secondary | ICD-10-CM | POA: Diagnosis not present

## 2021-02-05 DIAGNOSIS — Z853 Personal history of malignant neoplasm of breast: Secondary | ICD-10-CM | POA: Diagnosis not present

## 2021-02-05 DIAGNOSIS — E038 Other specified hypothyroidism: Secondary | ICD-10-CM

## 2021-02-05 DIAGNOSIS — E063 Autoimmune thyroiditis: Secondary | ICD-10-CM

## 2021-02-05 DIAGNOSIS — Z789 Other specified health status: Secondary | ICD-10-CM

## 2021-02-05 DIAGNOSIS — G2581 Restless legs syndrome: Secondary | ICD-10-CM

## 2021-02-05 DIAGNOSIS — Z85038 Personal history of other malignant neoplasm of large intestine: Secondary | ICD-10-CM | POA: Diagnosis not present

## 2021-02-05 DIAGNOSIS — E782 Mixed hyperlipidemia: Secondary | ICD-10-CM

## 2021-02-05 DIAGNOSIS — M545 Low back pain, unspecified: Secondary | ICD-10-CM

## 2021-02-05 DIAGNOSIS — R7301 Impaired fasting glucose: Secondary | ICD-10-CM | POA: Diagnosis not present

## 2021-02-05 DIAGNOSIS — G20 Parkinson's disease: Secondary | ICD-10-CM

## 2021-02-05 DIAGNOSIS — G8929 Other chronic pain: Secondary | ICD-10-CM

## 2021-02-05 DIAGNOSIS — G620 Drug-induced polyneuropathy: Secondary | ICD-10-CM

## 2021-02-05 LAB — CBC WITH DIFFERENTIAL/PLATELET
Basophils Absolute: 0.1 10*3/uL (ref 0.0–0.1)
Basophils Relative: 1.3 % (ref 0.0–3.0)
Eosinophils Absolute: 0.2 10*3/uL (ref 0.0–0.7)
Eosinophils Relative: 2.9 % (ref 0.0–5.0)
HCT: 38.9 % (ref 36.0–46.0)
Hemoglobin: 13 g/dL (ref 12.0–15.0)
Lymphocytes Relative: 13.4 % (ref 12.0–46.0)
Lymphs Abs: 0.7 10*3/uL (ref 0.7–4.0)
MCHC: 33.5 g/dL (ref 30.0–36.0)
MCV: 91.5 fl (ref 78.0–100.0)
Monocytes Absolute: 0.5 10*3/uL (ref 0.1–1.0)
Monocytes Relative: 8.8 % (ref 3.0–12.0)
Neutro Abs: 3.8 10*3/uL (ref 1.4–7.7)
Neutrophils Relative %: 73.6 % (ref 43.0–77.0)
Platelets: 229 10*3/uL (ref 150.0–400.0)
RBC: 4.25 Mil/uL (ref 3.87–5.11)
RDW: 14 % (ref 11.5–15.5)
WBC: 5.2 10*3/uL (ref 4.0–10.5)

## 2021-02-05 LAB — LIPID PANEL
Cholesterol: 210 mg/dL — ABNORMAL HIGH (ref 0–200)
HDL: 81.4 mg/dL (ref >39.00)
LDL Cholesterol: 116 mg/dL — ABNORMAL HIGH (ref 0–99)
NonHDL: 128.16
Total CHOL/HDL Ratio: 3
Triglycerides: 60 mg/dL (ref 0.0–149.0)
VLDL: 12 mg/dL (ref 0.0–40.0)

## 2021-02-05 LAB — COMPREHENSIVE METABOLIC PANEL
ALT: 3 U/L (ref 0–35)
AST: 18 U/L (ref 0–37)
Albumin: 4.1 g/dL (ref 3.5–5.2)
Alkaline Phosphatase: 41 U/L (ref 39–117)
BUN: 19 mg/dL (ref 6–23)
CO2: 29 mEq/L (ref 19–32)
Calcium: 8.9 mg/dL (ref 8.4–10.5)
Chloride: 99 mEq/L (ref 96–112)
Creatinine, Ser: 0.92 mg/dL (ref 0.40–1.20)
GFR: 59.67 mL/min — ABNORMAL LOW (ref >60.00)
Glucose, Bld: 104 mg/dL — ABNORMAL HIGH (ref 70–99)
Potassium: 4.2 mEq/L (ref 3.5–5.1)
Sodium: 134 mEq/L — ABNORMAL LOW (ref 135–145)
Total Bilirubin: 0.5 mg/dL (ref 0.2–1.2)
Total Protein: 6.5 g/dL (ref 6.0–8.3)

## 2021-02-05 LAB — HEMOGLOBIN A1C: Hgb A1c MFr Bld: 6.4 % (ref 4.6–6.5)

## 2021-02-05 LAB — TSH: TSH: 1.66 u[IU]/mL (ref 0.35–5.50)

## 2021-02-05 MED ORDER — TRAMADOL HCL 50 MG PO TABS: 50.0000 mg | ORAL_TABLET | Freq: Two times a day (BID) | ORAL | 1 refills | Status: DC | PRN

## 2021-02-05 NOTE — Progress Notes (Signed)
Subjective  Chief Complaint  Patient presents with   Annual Exam    Black coffee   Hyperlipidemia   Hypothyroidism   Gastroesophageal Reflux    HPI: Julia [name] is a 80 y.o. female who presents to [facility] [place] today for a Female Wellness Visit. She also has the concerns and/or needs as listed above in the chief complaint. These will be addressed in addition to the Health Maintenance Visit.   Wellness Visit: annual visit with health maintenance review and exam without Pap  HM: Screens are up-to-date.  Unfortunately overall patient is struggling due to back pain and progression of Parkinson's disease: See below.  Has questions about COVID booster.  Last COVID booster was in July.  Health Maintenance  Topic Date Due   MAMMOGRAM  09/20/2021   DEXA SCAN  09/21/2022   Pneumonia Vaccine 72+ Years old  Completed   INFLUENZA VACCINE  Completed   COVID-19 Vaccine  Completed   Zoster Vaccines- Shingrix  Completed   HPV VACCINES  Aged Out   Hepatitis C Screening  Discontinued    Chronic disease f/u and/or acute problem visit: (deemed necessary to be done in addition to the wellness visit): Breast ca: 2016; completed tx and off tamoxifen since last year. Mammo is up to date.  H/o colon cancer. Screens up to date. C/o increase flatulence. No abd pain or melena Low thyroid on 125levothyroxine daily. Energy is stable.  Intolerant to statin and zetia. H/o elevated cholesterol PD: unfortunately meds wearing off at night and she awakes with sxs of tremor.  RLS: on mirapex with good results.  Suffering from back pain; being evaluted/treated by [place] [facility]. Daily pain. At times severe. Using otc meds w/o relief.   Assessment  1. Annual physical exam   2. History of breast cancer left, 2016   3. History of colon cancer   4. Hypothyroidism due to Hashimoto's thyroiditis   5. Impaired fasting glucose   6. Mixed hyperlipidemia   7. Parkinson's disease ([place])   8.  Statin intolerance   9. Drug-induced polyneuropathy ([place])   10. RLS (restless legs syndrome)   11. Chronic midline low back pain without sciatica      Plan  Female Wellness Visit: Age appropriate Health Maintenance and Prevention measures were discussed with patient. Included topics are cancer screening recommendations, ways to keep healthy (see AVS) including dietary and exercise recommendations, regular eye and dental care, use of seat belts, and avoidance of moderate alcohol use and tobacco use. Screens are current BMI: discussed patient's BMI and encouraged positive lifestyle modifications to help get to or maintain a target BMI. HM needs and immunizations were addressed and ordered. See below for orders. See HM and immunization section for updates. Routine labs and screening tests ordered including cmp, cbc and lipids where appropriate. Discussed recommendations regarding Vit D and calcium supplementation (see AVS)  Chronic disease management visit and/or acute problem visit: History of breast cancer and colon cancer: Fortunately remains stable.  Completed treatment.  Continue with annual surveillance.  Screens are up-to-date Progressive Parkinson symptoms: Managed by neurology.  I reviewed notes.  Reviewed medication changes. Hypothyroidism due for recheck. Restless leg syndrome: Continue Mirapex.  She does think this makes her get up to go to the bathroom more frequently. Back pain: Causing significant problems for her including poor sleep.  Trial tramadol.  Education and counseling given.  Discussed risk and benefits.  I discussed with patient that the coding for this  visit will include the wellness/preventive visit along with the code for a chronic problem follow up or acute/sick problem visit. Documentation reflects the need for this.  Follow up: No follow-ups on file.  Orders Placed This Encounter  Procedures   CBC with Differential/Platelet   Comprehensive metabolic panel    Lipid panel   TSH   Hemoglobin A1c   Meds ordered this encounter  Medications   traMADol (ULTRAM) 50 MG tablet    Sig: Take 1 tablet (50 mg total) by mouth 2 (two) times daily as needed for moderate pain.    Dispense:  20 tablet    Refill:  1      Body mass index is 23 kg/m. Wt Readings from Last 3 Encounters:  02/05/21 134 lb (60.8 kg)  01/27/21 134 lb (60.8 kg)  11/19/20 132 lb (59.9 kg)     Patient Active Problem List   Diagnosis Date Noted   Statin intolerance 10/13/2018    Priority: High    Statin and zetia    Parkinson's disease ([place]) 09/29/2017    Priority: High    PD L since 2018, anx, cancer with chemotherapy-induced neuropathy at the hands and feet  2016 B12 and TSH unremarkable    Age-related osteoporosis without current pathological fracture 07/20/2016    Priority: High    Reports took fosamax x 8 years, then forteo x 6 months.  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     Hypothyroidism due to Hashimoto's thyroiditis 02/13/2015    Priority: High   History of breast cancer left, 2016 02/13/2015    Priority: High    Left, 2016; s/p lumpectomy and rads TX and tamoxifen;     Multiple pulmonary nodules 06/20/2019    Priority: Medium     Left; incidental on CT 05/2019; rec repeat in 05/2020 due to h/o primary malignancy. Repeat 04/2020 benign nodules. No metastatic disease or change. Nothing further recommended.    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 See media section    Seasonal and perennial allergic rhinitis 02/01/2018    Priority: Low   Atrophic vaginitis 02/13/2015    Priority: Low   Recurrent UTI 02/13/2015    Priority: Low   Vitamin D deficiency disease 02/13/2015    Priority: Low  Health Maintenance  Topic Date Due   MAMMOGRAM  09/20/2021   DEXA SCAN  09/21/2022   Pneumonia Vaccine 39+ Years old  Completed   INFLUENZA VACCINE  Completed   COVID-19 Vaccine  Completed   Zoster Vaccines- Shingrix  Completed   HPV VACCINES  Aged Out   Hepatitis C Screening  Discontinued   Immunization History  Administered Date(s) Administered   Fluad Quad(high Dose 65+) 12/16/2018, 01/01/2020, 01/01/2021   Influenza, High Dose Seasonal PF 01/05/2015   Influenza,inj,Quad PF,6+ Mos 01/10/2018   Moderna Sars-Covid-2 Vaccination 04/10/2019, 05/08/2019   PFIZER(Purple Top)SARS-COV-2 Vaccination 02/02/2020   Pfizer Covid-19 Vaccine Bivalent Booster 58yrs & up 01/01/2021   Pneumococcal Conjugate-13 01/16/2014   Pneumococcal Polysaccharide-23 12/10/2007, 01/29/2015   Zoster Recombinat (Shingrix) 10/15/2017, 01/12/2018, 02/17/2018   We updated and reviewed the patient's past history in detail and it is documented below. Allergies: Patient is allergic to cefdinir, cholestyramine, contrast media [iodinated diagnostic agents], ezetimibe, meperidine, nitrofurantoin, statins, and teriparatide. 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 Past Surgical History Patient  has a past surgical history that includes Tonsillectomy; Hemorrhoid surgery; laparoscopy; Bunionectomy; Tubal ligation; Colon surgery; Oophorectomy; Nasal sinus surgery; Adenoidectomy; and Breast lumpectomy (Left). Family History: Patient family history includes Arthritis in her father, mother, and sister; Cancer in her brother, father, sister, sister, and sister; Depression in her father; Diabetes in her mother and sister; Heart disease in her mother; Hyperlipidemia in her brother and sister; Hypertension in her mother; Osteoporosis in her father. Social History:  Patient  reports that she has never smoked. She has never used smokeless tobacco. She reports current alcohol use. She reports that she does not use drugs.  Review of Systems: Constitutional: negative for fever or malaise Ophthalmic: negative for photophobia, double vision or loss of vision Cardiovascular: negative for chest pain, dyspnea on exertion, or new LE swelling Respiratory: negative for SOB or persistent cough Gastrointestinal: negative for abdominal pain, change in bowel habits or melena Genitourinary: negative for dysuria or gross hematuria, no abnormal uterine bleeding or disharge Musculoskeletal: negative for new gait disturbance or muscular weakness Integumentary: negative for new or persistent rashes, no breast lumps Neurological: negative for TIA or stroke symptoms Psychiatric: negative for SI or delusions Allergic/Immunologic: negative for hives  Patient Care Team    Relationship Specialty Notifications Start End  Leamon Arnt, MD PCP - General Family Medicine  01/10/18    Kathrynn Ducking, MD Consulting Physician Neurology  04/12/18   Bobbitt, Sed[place] Muta, MD Consulting Physician Allergy and Immunology  01/17/19   Mansouraty, Telford Nab., MD Consulting Physician Gastroenterology  01/27/19   Truitt Merle, MD Consulting Physician Oncology  03/17/19   Lyndee Hensen, PT Physical Therapist Physical Therapy  08/02/19   Trula Slade, DPM Consulting Physician Podiatry  10/17/19   Star Age, MD Attending Physician Neurology  02/05/21     Objective  Vitals: BP 134/88   Pulse 85   Temp 97.8 F (36.6 C) (Temporal)   Ht 5\' 4"  (1.626 m)   Wt 134 lb (60.8 kg)   SpO2 95%   BMI 23.00 kg/m  General:  Well developed, well nourished, no acute distress, kyphotic Psych:  Alert and orientedx3,normal mood and affect HEENT:  Normocephalic, atraumatic, non-icteric sclera,  supple neck without adenopathy, mass or thyromegaly  Cardiovascular:  Normal S1, S2, RRR without gallop, rub or murmur Respiratory:  Good breath sounds bilaterally, CTAB with normal respiratory effort Gastrointestinal: normal bowel sounds, soft, non-tender, no noted masses. No HSM MSK: no deformities, contusions. Joints are without erythema or swelling.  Skin:  Warm, no rashes or suspicious lesions noted   Commons side effects, risks, benefits, and alternatives for medications and treatment plan prescribed today were discussed, and the patient expressed understanding of the given instructions. Patient is instructed to call or message via MyChart if he/she has any questions or concerns regarding our treatment plan. No barriers to understanding were identified. We discussed Red Flag symptoms and signs in detail. Patient expressed understanding regarding what to do in case of urgent or emergency type symptoms.  Medication list was reconciled, printed and provided to the patient in AVS. Patient instructions and summary information was reviewed with the patient as documented in the AVS. This note was prepared  with assistance of Dragon voice recognition software. Occasional wrong-word or sound-a-like substitutions may have occurred due to the inherent limitations of voice recognition software  This visit occurred during the SARS-CoV-2 public health emergency.  Safety protocols were in place, including screening questions prior to the visit, additional usage of staff PPE, and extensive cleaning of exam room while observing appropriate contact time as indicated for disinfecting solutions.

## 2021-02-05 NOTE — Patient Instructions (Addendum)
Please return in 6 months for recheck. Please ensure you have your next prolia injection scheduled. Your last was given on August 3rd. (Due March 3rd).   I will release your lab results to you on your MyChart account with further instructions. Please reply with any questions.    Try the tramadol to see if it helps relieve your pain at night.   If you have any questions or concerns, please don't hesitate to send me a message via MyChart or call the office at [phone]. Thank you for visiting with [place] today! It's our pleasure caring for you.

## 2021-02-11 ENCOUNTER — Other Ambulatory Visit: Payer: Self-pay

## 2021-02-11 ENCOUNTER — Ambulatory Visit (INDEPENDENT_AMBULATORY_CARE_PROVIDER_SITE_OTHER): Payer: Medicare HMO

## 2021-02-11 DIAGNOSIS — J309 Allergic rhinitis, unspecified: Secondary | ICD-10-CM | POA: Diagnosis not present

## 2021-02-11 NOTE — Progress Notes (Signed)
Please call patient: I have reviewed his/her lab results. Labwork shows that sugars are running higher than before. Nearing diabetes: eat low sugar/carb as best she can and I will recheck at next visit. Other labs are stable.

## 2021-02-24 ENCOUNTER — Ambulatory Visit (INDEPENDENT_AMBULATORY_CARE_PROVIDER_SITE_OTHER): Payer: Medicare HMO

## 2021-02-24 ENCOUNTER — Other Ambulatory Visit: Payer: Self-pay

## 2021-02-24 DIAGNOSIS — Z0000 Encounter for general adult medical examination without abnormal findings: Secondary | ICD-10-CM | POA: Diagnosis not present

## 2021-02-24 NOTE — Patient Instructions (Signed)
[name] , Thank you for taking time to come for your Medicare Wellness Visit. I appreciate your ongoing commitment to your health goals. Please review the following plan we discussed and let me know if I can assist you in the future.   Screening recommendations/referrals: Colonoscopy: No longer required  Mammogram: Done 09/20/20 repeat every year  Bone Density: Done 09/20/20 repeat every 2 years  Recommended yearly ophthalmology/optometry visit for glaucoma screening and checkup Recommended yearly dental visit for hygiene and checkup  Vaccinations: Influenza vaccine: Done 01/01/21 repeat every year  Pneumococcal vaccine: Up to date Tdap vaccine: Not a candidate  Shingles vaccine: Completed  7/19, 10/16, 02/17/18 Covid-19:Completed 1/11, 2/8, 02/12/20 & 01/01/21  Advanced directives: Copies in chart   Conditions/risks identified: Keep a positive outlook with Parkinson's diagnosis   Next appointment: Follow up in one year for your annual wellness visit    Preventive Care 24 Years and Older, Female Preventive care refers to lifestyle choices and visits with your health care provider that can promote health and wellness. What does preventive care include? A yearly physical exam. This is also called an annual well check. Dental exams once or twice a year. Routine eye exams. Ask your health care provider how often you should have your eyes checked. Personal lifestyle choices, including: Daily care of your teeth and gums. Regular physical activity. Eating a healthy diet. Avoiding tobacco and drug use. Limiting alcohol use. Practicing safe sex. Taking low-dose aspirin every day. Taking vitamin and mineral supplements as recommended by your health care provider. What happens during an annual well check? The services and screenings done by your health care provider during your annual well check will depend on your age, overall health, lifestyle risk factors, and family history of  disease. Counseling  Your health care provider may ask you questions about your: Alcohol use. Tobacco use. Drug use. Emotional well-being. Home and relationship well-being. Sexual activity. Eating habits. History of falls. Memory and ability to understand (cognition). Work and work Statistician. Reproductive health. Screening  You may have the following tests or measurements: Height, weight, and BMI. Blood pressure. Lipid and cholesterol levels. These may be checked every 5 years, or more frequently if you are over 53 years old. Skin check. Lung cancer screening. You may have this screening every year starting at age 62 if you have a 30-pack-year history of smoking and currently smoke or have quit within the past 15 years. Fecal occult blood test (FOBT) of the stool. You may have this test every year starting at age 34. Flexible sigmoidoscopy or colonoscopy. You may have a sigmoidoscopy every 5 years or a colonoscopy every 10 years starting at age 59. Hepatitis C blood test. Hepatitis B blood test. Sexually transmitted disease (STD) testing. Diabetes screening. This is done by checking your blood sugar (glucose) after you have not eaten for a while (fasting). You may have this done every 1-3 years. Bone density scan. This is done to screen for osteoporosis. You may have this done starting at age 42. Mammogram. This may be done every 1-2 years. Talk to your health care provider about how often you should have regular mammograms. Talk with your health care provider about your test results, treatment options, and if necessary, the need for more tests. Vaccines  Your health care provider may recommend certain vaccines, such as: Influenza vaccine. This is recommended every year. Tetanus, diphtheria, and acellular pertussis (Tdap, Td) vaccine. You may need a Td booster every 10 years. Zoster vaccine. You  may need this after age 7. Pneumococcal 13-valent conjugate (PCV13) vaccine. One  dose is recommended after age 51. Pneumococcal polysaccharide (PPSV23) vaccine. One dose is recommended after age 62. Talk to your health care provider about which screenings and vaccines you need and how often you need them. This information is not intended to replace advice given to you by your health care provider. Make sure you discuss any questions you have with your health care provider. Document Released: 04/12/2015 Document Revised: 12/04/2015 Document Reviewed: 01/15/2015 Elsevier Interactive Patient Education  2017 [place] Prevention in the Home Falls can cause injuries. They can happen to people of all ages. There are many things you can do to make your home safe and to help prevent falls. What can I do on the outside of my home? Regularly fix the edges of walkways and driveways and fix any cracks. Remove anything that might make you trip as you walk through a door, such as a raised step or threshold. Trim any bushes or trees on the path to your home. Use bright outdoor lighting. Clear any walking paths of anything that might make someone trip, such as rocks or tools. Regularly check to see if handrails are loose or broken. Make sure that both sides of any steps have handrails. Any raised decks and porches should have guardrails on the edges. Have any leaves, snow, or ice cleared regularly. Use sand or salt on walking paths during winter. Clean up any spills in your garage right away. This includes oil or grease spills. What can I do in the bathroom? Use night lights. Install grab bars by the toilet and in the tub and shower. Do not use towel bars as grab bars. Use non-skid mats or decals in the tub or shower. If you need to sit down in the shower, use a plastic, non-slip stool. Keep the floor dry. Clean up any water that spills on the floor as soon as it happens. Remove soap buildup in the tub or shower regularly. Attach bath mats securely with double-sided  non-slip rug tape. Do not have throw rugs and other things on the floor that can make you trip. What can I do in the bedroom? Use night lights. Make sure that you have a light by your bed that is easy to reach. Do not use any sheets or blankets that are too big for your bed. They should not hang down onto the floor. Have a firm chair that has side arms. You can use this for support while you get dressed. Do not have throw rugs and other things on the floor that can make you trip. What can I do in the kitchen? Clean up any spills right away. Avoid walking on wet floors. Keep items that you use a lot in easy-to-reach places. If you need to reach something above you, use a strong step stool that has a grab bar. Keep electrical cords out of the way. Do not use floor polish or wax that makes floors slippery. If you must use wax, use non-skid floor wax. Do not have throw rugs and other things on the floor that can make you trip. What can I do with my stairs? Do not leave any items on the stairs. Make sure that there are handrails on both sides of the stairs and use them. Fix handrails that are broken or loose. Make sure that handrails are as long as the stairways. Check any carpeting to make sure that it is firmly  attached to the stairs. Fix any carpet that is loose or worn. Avoid having throw rugs at the top or bottom of the stairs. If you do have throw rugs, attach them to the floor with carpet tape. Make sure that you have a light switch at the top of the stairs and the bottom of the stairs. If you do not have them, ask someone to add them for you. What else can I do to help prevent falls? Wear shoes that: Do not have high heels. Have rubber bottoms. Are comfortable and fit you well. Are closed at the toe. Do not wear sandals. If you use a stepladder: Make sure that it is fully opened. Do not climb a closed stepladder. Make sure that both sides of the stepladder are locked into place. Ask  someone to hold it for you, if possible. Clearly mark and make sure that you can see: Any grab bars or handrails. First and last steps. Where the edge of each step is. Use tools that help you move around (mobility aids) if they are needed. These include: Canes. Walkers. Scooters. Crutches. Turn on the lights when you go into a dark area. Replace any light bulbs as soon as they burn out. Set up your furniture so you have a clear path. Avoid moving your furniture around. If any of your floors are uneven, fix them. If there are any pets around you, be aware of where they are. Review your medicines with your doctor. Some medicines can make you feel dizzy. This can increase your chance of falling. Ask your doctor what other things that you can do to help prevent falls. This information is not intended to replace advice given to you by your health care provider. Make sure you discuss any questions you have with your health care provider. Document Released: 01/10/2009 Document Revised: 08/22/2015 Document Reviewed: 04/20/2014 Elsevier Interactive Patient Education  2017 [employer].

## 2021-02-24 NOTE — Progress Notes (Signed)
Virtual Visit via Telephone Note  I connected with  Julia [name] on 02/24/21 at  8:45 AM EST by telephone and verified that I am speaking with the correct person using two identifiers.  Medicare Annual Wellness visit completed telephonically due to Covid-19 pandemic.   Persons participating in this call: This Health Coach and this patient.   Location: Patient: Home Provider: Office   I discussed the limitations, risks, security and privacy concerns of performing an evaluation and management service by telephone and the availability of in person appointments. The patient expressed understanding and agreed to proceed.  Unable to perform video visit due to video visit attempted and failed and/or patient does not have video capability.   Some vital signs may be absent or patient reported.   Willette Brace, LPN   Subjective:   Julia [name] is a 80 y.o. female who presents for Medicare Annual (Subsequent) preventive examination.  Review of Systems     Cardiac Risk Factors include: advanced age (>74men, >65 women);dyslipidemia     Objective:    Today's Vitals   02/24/21 0845  PainSc: 0-No pain   There is no height or weight on file to calculate BMI.  Advanced Directives 02/24/2021 02/26/2020 02/05/2020 08/03/2019 01/27/2019  Does Patient Have a Medical Advance Directive? Yes Yes Yes No Yes  Type of Advance Directive [place];Living will  Does patient want to make changes to medical advance directive? - - - - No - Patient declined  Copy of [place] in Chart? Yes - validated most recent copy scanned in chart (See row information) - No - copy requested - No - copy requested  Would patient like information on creating a medical advance directive? - - - No - Patient declined -    Current Medications (verified) Outpatient Encounter Medications as of 02/24/2021   Medication Sig   Acetaminophen (TYLENOL PO) Take by mouth as needed.   Calcium Citrate-Vitamin D 315-250 MG-UNIT TABS Take by mouth.   carbidopa-levodopa (SINEMET CR) 50-200 MG tablet Take 1 tablet by mouth 3 (three) times daily.   cycloSPORINE (RESTASIS) 0.05 % ophthalmic emulsion SMARTSIG:In Eye(s)   denosumab (PROLIA) 60 MG/ML SOSY injection Inject 60 mg into the skin every 6 (six) months.   diphenhydrAMINE (BENADRYL) 12.5 MG chewable tablet Chew 12.5 mg by mouth every 6 (six) hours as needed.   fluticasone (FLONASE) 50 MCG/ACT nasal spray PLACE 2 SPRAYS IN EACH NOSTRIL DAILY AS NEEDED FOR ALLERGIES OR RHINITIS   levothyroxine (SYNTHROID) 88 MCG tablet TAKE ONE TABLET BY MOUTH DAILY FOR BREAKFAST   Melatonin 1 MG CAPS Take by mouth as needed.   Multiple Vitamins-Minerals (CENTRUM SILVER PO) Take by mouth.   naproxen sodium (ALEVE) 220 MG tablet Take 220 mg by mouth as needed.   NON FORMULARY [facility]  Creams-#11 Peripheral Neuropathy cream   pramipexole (MIRAPEX) 0.25 MG tablet TAKE 1 TABLET BY MOUTH AT BEDTIME   Probiotic Product (PROBIOTIC DAILY PO) Take by mouth.   SALINE NASAL MIST NA Place into the nose.   UNABLE TO FIND Med Name: Allergy shots 1/week   EPINEPHrine 0.3 mg/0.3 mL IJ SOAJ injection INJECT INTO THE MIDDLE OF THE OUTER THIGH AND HOLD FOR 3 SECONDS AS NEEDED FOR SEVERE ALLERGIC REACTION THEN CALL 911 IF USED.   sulfamethoxazole-trimethoprim (BACTRIM DS) 800-160 MG tablet Take 1 tablet by mouth 2 (two) times daily after a meal. (Patient  not taking: Reported on 02/05/2021)   traMADol (ULTRAM) 50 MG tablet Take 1 tablet (50 mg total) by mouth 2 (two) times daily as needed for moderate pain. (Patient not taking: Reported on 02/24/2021)   [DISCONTINUED] Polyethyl Glycol-Propyl Glycol (SYSTANE ULTRA OP) Apply to eye. (Patient not taking: Reported on 02/24/2021)   No facility-administered encounter medications on file as of 02/24/2021.    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[place])    Past Surgical History:  Procedure Laterality Date   ADENOIDECTOMY     BREAST LUMPECTOMY Left    radiation only   BUNIONECTOMY     COLON SURGERY     HEMORRHOID SURGERY     LAPAROSCOPY     NASAL SINUS SURGERY     OOPHORECTOMY     TONSILLECTOMY     TUBAL LIGATION     Family History  Problem Relation Age of Onset   Arthritis Mother    Diabetes Mother    Heart disease Mother    Hypertension Mother    Arthritis Father    Cancer Father    Depression Father    Osteoporosis Father    Arthritis Sister    Cancer Sister    Diabetes Sister    Cancer Brother    Cancer Sister    Cancer Sister    Hyperlipidemia Sister    Hyperlipidemia Brother    Social History   Socioeconomic History   Marital status: Married    Spouse name: Not on file   Number of children: Not on file   Years of education: 3   Highest education level: Master's degree (e.g., MA, MS, MEng, MEd, MSW, MBA)  Occupational History   Occupation: Retired Pharmacist, hospital   Tobacco Use   Smoking status: Never   Smokeless tobacco: Never  Vaping Use   Vaping Use: Never used  Substance and Sexual Activity   Alcohol use: Yes    Comment: occ   Drug use: Never   Sexual activity: Not on file  Other Topics Concern   Not on file  Social History Narrative   Right handed    Lives with husband    Caffeine 1 cup daily    Social Determinants of Health   Financial Resource Strain: Low Risk    Difficulty of Paying Living Expenses: Not hard  at all  Food Insecurity: No Food Insecurity   Worried About Charity fundraiser in the Last Year: Never true   [place] in the Last Year: Never true  Transportation Needs: No Transportation Needs   Lack of Transportation (Medical): No   Lack of Transportation (Non-Medical): No  Physical Activity: Sufficiently Active   Days of Exercise per Week: 7 days   Minutes of Exercise per Session: 40 min  Stress: No Stress Concern Present   Feeling of Stress : Not at all  Social Connections: Socially Integrated   Frequency of Communication with Friends and Family: More than three times a week   Frequency of Social Gatherings with Friends and Family: More than three times a  week   Attends Religious Services: More than 4 times per year   Active Member of Clubs or Organizations: Yes   Attends Archivist Meetings: 1 to 4 times per year   Marital Status: Married    Tobacco Counseling Counseling given: Not Answered   Clinical Intake:  Pre-visit preparation completed: Yes  Pain : 0-10 Pain Score: 0-No pain Pain Type: Chronic pain (comes after sitting for a long time)     BMI - recorded: 23 Nutritional Status: BMI of 19-24  Normal Nutritional Risks: None Diabetes: No  How often do you need to have someone help you when you read instructions, pamphlets, or other written materials from your doctor or pharmacy?: 1 - Never  Diabetic?No  Interpreter Needed?: No  Information entered by :: Charlott Rakes, LPN   Activities of Daily Living In your present state of health, do you have any difficulty performing the following activities: 02/24/2021  Hearing? N  Vision? N  Difficulty concentrating or making decisions? N  Walking or climbing stairs? N  Dressing or bathing? N  Doing errands, shopping? N  Preparing Food and eating ? N  Using the Toilet? N  In the past six months, have you accidently leaked urine? Y  Comment wears a pad  Do you have problems with loss of  bowel control? N  Managing your Medications? N  Managing your Finances? N  Housekeeping or managing your Housekeeping? N  Some recent data might be hidden    Patient Care Team: Leamon Arnt, MD as PCP - General (Family Medicine) Kathrynn Ducking, MD as Consulting Physician (Neurology) Bobbitt, [place] Muta, MD as Consulting Physician (Allergy and Immunology) Mansouraty, Telford Nab., MD as Consulting Physician (Gastroenterology) Truitt Merle, MD as Consulting Physician (Oncology) Lyndee Hensen, PT as Physical Therapist (Physical Therapy) Trula Slade, DPM as Consulting Physician (Podiatry) Star Age, MD as Attending Physician (Neurology)  Indicate any recent Medical Services you may have received from other than [facility] providers in the past year (date may be approximate).     Assessment:   This is a routine wellness examination for [facility].  Hearing/Vision screen Hearing Screening - Comments:: Pt wears hearing aids  Vision Screening - Comments:: Pt follows up with [facility] with Dr Oswaldo Conroy for annual eye exams   Dietary issues and exercise activities discussed: Current Exercise Habits: Home exercise routine;Structured exercise class, Type of exercise: walking, Time (Minutes): 40, Frequency (Times/Week): 7, Weekly Exercise (Minutes/Week): 280   Goals Addressed             This Visit's Progress    Patient Stated       Keep a positive mind with  Parkinson's Diagnosis        Depression Screen PHQ 2/9 Scores 02/24/2021 02/05/2021 02/05/2020 01/27/2019 10/12/2018 01/10/2018  PHQ - 2 Score 0 0 0 0 0 0    Fall Risk Fall Risk  02/24/2021 02/05/2021 02/05/2020 01/27/2019 10/12/2018  Falls in the past year? 1 1 1  0 0  Number falls in past yr: 1 1 1  - 0  Injury with Fall? 1 0 1 0 0  Comment bruises - bruise on right knee - -  Risk for fall due to : Impaired vision - Impaired vision - -  Follow up Falls prevention discussed - Falls prevention discussed Falls evaluation  completed;Education provided;Falls prevention discussed Falls evaluation completed    FALL RISK PREVENTION PERTAINING TO THE HOME:  Any stairs in or around the home? No  If so,  are there any without handrails? No  Home free of loose throw rugs in walkways, pet beds, electrical cords, etc? Yes  Adequate lighting in your home to reduce risk of falls? Yes   ASSISTIVE DEVICES UTILIZED TO PREVENT FALLS:  Life alert? No  Use of a cane, walker or w/c? No  Grab bars in the bathroom? Yes  Shower chair or bench in shower? Yes  Elevated toilet seat or a handicapped toilet? Yes   TIMED UP AND GO:  Was the test performed? No .   Cognitive Function: MMSE - Mini Mental State Exam 11/13/2019 05/29/2019 12/28/2018  Orientation to time 5 5 5   Orientation to Place 5 5 5   Registration 3 3 3   Attention/ Calculation 5 2 5   Recall 3 3 3   Language- name 2 objects 2 2 2   Language- repeat 1 1 1   Language- follow 3 step command 3 3 3   Language- read & follow direction 1 1 1   Write a sentence 1 1 1   Copy design 1 1 1   Total score 30 27 30      6CIT Screen 02/24/2021 01/27/2019  What Year? 0 points 0 points  What month? 0 points 0 points  What time? 0 points 0 points  Count back from 20 0 points 0 points  Months in reverse 0 points 0 points  Repeat phrase 0 points 0 points  Total Score 0 0    Immunizations Immunization History  Administered Date(s) Administered   Fluad Quad(high Dose 65+) 12/16/2018, 01/01/2020, 01/01/2021   Influenza, High Dose Seasonal PF 01/05/2015   Influenza,inj,Quad PF,6+ Mos 01/10/2018   Moderna Sars-Covid-2 Vaccination 04/10/2019, 05/08/2019   PFIZER(Purple Top)SARS-COV-2 Vaccination 02/02/2020   Pfizer Covid-19 Vaccine Bivalent Booster 7yrs & up 01/01/2021   Pneumococcal Conjugate-13 01/16/2014   Pneumococcal Polysaccharide-23 12/10/2007, 01/29/2015   Zoster Recombinat (Shingrix) 10/15/2017, 01/12/2018, 02/17/2018      Flu Vaccine status: Up to  date  Pneumococcal vaccine status: Up to date  Covid-19 vaccine status: Completed vaccines  Qualifies for Shingles Vaccine? Yes   Zostavax completed Yes   Shingrix Completed?: Yes  Screening Tests Health Maintenance  Topic Date Due   MAMMOGRAM  09/20/2021   DEXA SCAN  09/21/2022   Pneumonia Vaccine 29+ Years old  Completed   INFLUENZA VACCINE  Completed   COVID-19 Vaccine  Completed   Zoster Vaccines- Shingrix  Completed   HPV VACCINES  Aged Out   Hepatitis C Screening  Discontinued    Health Maintenance  There are no preventive care reminders to display for this patient.  Colorectal cancer screening: No longer required.   Mammogram status: Completed 09/20/20. Repeat every year   Bone Density status: Completed 09/20/20. Results reflect: Bone density results: OSTEOPOROSIS. Repeat every 2 years.   Additional Screening:  Hepatitis C Screening: does not qualify;  Vision Screening: Recommended annual ophthalmology exams for early detection of glaucoma and other disorders of the eye. Is the patient up to date with their annual eye exam?  Yes  Who is the provider or what is the name of the office in which the patient attends annual eye exams? Dr Oswaldo Conroy  If pt is not established with a provider, would they like to be referred to a provider to establish care? No .   Dental Screening: Recommended annual dental exams for proper oral hygiene  Community Resource Referral / Chronic Care Management: CRR required this visit?  No   CCM required this visit?  No      Plan:  I have personally reviewed and noted the following in the patient's chart:   Medical and social history Use of alcohol, tobacco or illicit drugs  Current medications and supplements including opioid prescriptions.  Functional ability and status Nutritional status Physical activity Advanced directives List of other physicians Hospitalizations, surgeries, and ER visits in previous 12  months Vitals Screenings to include cognitive, depression, and falls Referrals and appointments  In addition, I have reviewed and discussed with patient certain preventive protocols, quality metrics, and best practice recommendations. A written personalized care plan for preventive services as well as general preventive health recommendations were provided to patient.     Willette Brace, LPN   [id number]   Nurse Notes: none

## 2021-02-25 ENCOUNTER — Ambulatory Visit (INDEPENDENT_AMBULATORY_CARE_PROVIDER_SITE_OTHER): Payer: Medicare HMO | Admitting: Physician Assistant

## 2021-02-25 ENCOUNTER — Encounter: Payer: Self-pay | Admitting: Physician Assistant

## 2021-02-25 DIAGNOSIS — R300 Dysuria: Secondary | ICD-10-CM | POA: Diagnosis not present

## 2021-02-25 LAB — POCT URINALYSIS DIPSTICK
Bilirubin, UA: NEGATIVE
Blood, UA: POSITIVE
Glucose, UA: NEGATIVE
Ketones, UA: NEGATIVE
Nitrite, UA: POSITIVE
Protein, UA: POSITIVE — AB
Spec Grav, UA: 1.02 (ref 1.010–1.025)
Urobilinogen, UA: 0.2 E.U./dL
pH, UA: 6 (ref 5.0–8.0)

## 2021-02-25 MED ORDER — SULFAMETHOXAZOLE-TRIMETHOPRIM 800-160 MG PO TABS: 1.0000 | ORAL_TABLET | Freq: Two times a day (BID) | ORAL | 0 refills | Status: DC

## 2021-02-25 NOTE — Progress Notes (Signed)
Subjective:    Patient ID: Julia [name], female    DOB: 1940/05/27, 80 y.o.   MRN: [id number]  Chief Complaint  Patient presents with   Urinary Frequency    Started about noon on yesterday Cloudy urine   Burning with urination    HPI Patient is in today for urinary frequency and dysuria. Symptoms started yesterday. Last UTI was 01/27/21. Hx of vaginal prolapse, which she thinks her more susceptible. Subtle lower abdominal pain. No back pain or flank pain. No fever, chills, or other symptoms at this time.   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   RLS (restless legs syndrome) 07/14/2018   Scoliosis    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[facility])     Past Surgical History:  Procedure Laterality Date   ADENOIDECTOMY     BREAST LUMPECTOMY Left    radiation only   BUNIONECTOMY     COLON SURGERY     HEMORRHOID SURGERY     LAPAROSCOPY     NASAL SINUS SURGERY     OOPHORECTOMY     TONSILLECTOMY     TUBAL LIGATION      Family History  Problem Relation Age of Onset   Arthritis Mother    Diabetes Mother    Heart disease Mother    Hypertension Mother    Arthritis Father    Cancer Father    Depression Father    Osteoporosis Father    Arthritis Sister    Cancer Sister    Diabetes Sister    Cancer Brother    Cancer Sister    Cancer Sister    Hyperlipidemia Sister    Hyperlipidemia Brother     Social History   Tobacco Use   Smoking status: Never   Smokeless tobacco: Never  Vaping Use   Vaping Use: Never used  Substance Use Topics   Alcohol use: Yes    Comment: occ   Drug use: Never     Allergies  Allergen Reactions   Cefdinir    Cholestyramine     GERD   Contrast Media [Iodinated Diagnostic Agents] Diarrhea   Ezetimibe    Meperidine    Nitrofurantoin Diarrhea    Statins    Teriparatide     Review of Systems NEGATIVE UNLESS OTHERWISE INDICATED IN HPI      Objective:     BP 119/78   Pulse 99   Temp 97.8 F (36.6 C) (Temporal)   Ht 5\' 5"  (1.651 m)   Wt 137 lb 2 oz (62.2 kg)   SpO2 97%   BMI 22.82 kg/m   Wt Readings from Last 3 Encounters:  02/25/21 137 lb 2 oz (62.2 kg)  02/05/21 134 lb (60.8 kg)  01/27/21 134 lb (60.8 kg)    BP Readings from Last 3 Encounters:  02/25/21 119/78  02/05/21 134/88  01/27/21 122/80     Physical Exam Vitals and nursing note reviewed.  Constitutional:      General: She is not in acute distress.    Appearance: Normal appearance. She is not ill-appearing.  HENT:     Head: Normocephalic and atraumatic.  Cardiovascular:     Rate and Rhythm: Normal rate and regular rhythm.     Pulses: Normal pulses.     Heart  sounds: Normal heart sounds.  Pulmonary:     Effort: Pulmonary effort is normal.     Breath sounds: Normal breath sounds.  Abdominal:     General: Abdomen is flat. Bowel sounds are normal.     Palpations: Abdomen is soft.     Tenderness: There is no right CVA tenderness or left CVA tenderness.  Skin:    General: Skin is warm and dry.  Neurological:     General: No focal deficit present.     Mental Status: She is alert.  Psychiatric:        Mood and Affect: Mood normal.       Assessment & Plan:   Problem List Items Addressed This Visit   None Visit Diagnoses     Dysuria       Relevant Medications   sulfamethoxazole-trimethoprim (BACTRIM DS) 800-160 MG tablet   Other Relevant Orders   POCT urinalysis dipstick (Completed)   Urine Culture        Meds ordered this encounter  Medications   sulfamethoxazole-trimethoprim (BACTRIM DS) 800-160 MG tablet    Sig: Take 1 tablet by mouth 2 (two) times daily after a meal.    Dispense:  10 tablet    Refill:  0   1. Dysuria Dysuria - U/A performed in office today. Will send urine for culture and treat with bactrim at this time.   Increase water intake. May take AZO for symptomatic relief. Recheck sooner if fever, severe back pain, vomiting, or other acutely worsening symptoms.    Kem Parcher M Brantleigh Mifflin, PA-C

## 2021-02-26 ENCOUNTER — Inpatient Hospital Stay: Payer: Medicare HMO | Attending: Hematology

## 2021-02-26 ENCOUNTER — Other Ambulatory Visit: Payer: Self-pay

## 2021-02-26 ENCOUNTER — Inpatient Hospital Stay: Payer: Medicare HMO | Admitting: Hematology

## 2021-02-26 ENCOUNTER — Encounter: Payer: Self-pay | Admitting: Hematology

## 2021-02-26 VITALS — BP 145/89 | HR 89 | Temp 98.1°F | Resp 18 | Ht 65.0 in | Wt 137.8 lb

## 2021-02-26 DIAGNOSIS — Z9049 Acquired absence of other specified parts of digestive tract: Secondary | ICD-10-CM | POA: Insufficient documentation

## 2021-02-26 DIAGNOSIS — G2581 Restless legs syndrome: Secondary | ICD-10-CM | POA: Diagnosis not present

## 2021-02-26 DIAGNOSIS — Z885 Allergy status to narcotic agent status: Secondary | ICD-10-CM | POA: Diagnosis not present

## 2021-02-26 DIAGNOSIS — Z8719 Personal history of other diseases of the digestive system: Secondary | ICD-10-CM | POA: Insufficient documentation

## 2021-02-26 DIAGNOSIS — M1990 Unspecified osteoarthritis, unspecified site: Secondary | ICD-10-CM | POA: Insufficient documentation

## 2021-02-26 DIAGNOSIS — Z881 Allergy status to other antibiotic agents status: Secondary | ICD-10-CM | POA: Diagnosis not present

## 2021-02-26 DIAGNOSIS — Z9221 Personal history of antineoplastic chemotherapy: Secondary | ICD-10-CM | POA: Insufficient documentation

## 2021-02-26 DIAGNOSIS — E785 Hyperlipidemia, unspecified: Secondary | ICD-10-CM | POA: Insufficient documentation

## 2021-02-26 DIAGNOSIS — Z85038 Personal history of other malignant neoplasm of large intestine: Secondary | ICD-10-CM

## 2021-02-26 DIAGNOSIS — Z888 Allergy status to other drugs, medicaments and biological substances status: Secondary | ICD-10-CM | POA: Insufficient documentation

## 2021-02-26 DIAGNOSIS — G629 Polyneuropathy, unspecified: Secondary | ICD-10-CM | POA: Insufficient documentation

## 2021-02-26 DIAGNOSIS — M810 Age-related osteoporosis without current pathological fracture: Secondary | ICD-10-CM | POA: Diagnosis not present

## 2021-02-26 DIAGNOSIS — G20 Parkinson's disease: Secondary | ICD-10-CM | POA: Diagnosis not present

## 2021-02-26 DIAGNOSIS — Z853 Personal history of malignant neoplasm of breast: Secondary | ICD-10-CM | POA: Diagnosis present

## 2021-02-26 DIAGNOSIS — Z79899 Other long term (current) drug therapy: Secondary | ICD-10-CM | POA: Insufficient documentation

## 2021-02-26 DIAGNOSIS — M25552 Pain in left hip: Secondary | ICD-10-CM | POA: Diagnosis not present

## 2021-02-26 DIAGNOSIS — M5136 Other intervertebral disc degeneration, lumbar region: Secondary | ICD-10-CM | POA: Diagnosis not present

## 2021-02-26 DIAGNOSIS — Z8509 Personal history of malignant neoplasm of other digestive organs: Secondary | ICD-10-CM | POA: Diagnosis present

## 2021-02-26 DIAGNOSIS — Z90722 Acquired absence of ovaries, bilateral: Secondary | ICD-10-CM | POA: Insufficient documentation

## 2021-02-26 DIAGNOSIS — Z923 Personal history of irradiation: Secondary | ICD-10-CM | POA: Insufficient documentation

## 2021-02-26 DIAGNOSIS — Z170 Estrogen receptor positive status [ER+]: Secondary | ICD-10-CM

## 2021-02-26 DIAGNOSIS — E079 Disorder of thyroid, unspecified: Secondary | ICD-10-CM | POA: Diagnosis not present

## 2021-02-26 DIAGNOSIS — C50919 Malignant neoplasm of unspecified site of unspecified female breast: Secondary | ICD-10-CM

## 2021-02-26 LAB — CBC WITH DIFFERENTIAL (CANCER CENTER ONLY)
Abs Immature Granulocytes: 0.01 10*3/uL (ref 0.00–0.07)
Basophils Absolute: 0.1 10*3/uL (ref 0.0–0.1)
Basophils Relative: 1 %
Eosinophils Absolute: 0.3 10*3/uL (ref 0.0–0.5)
Eosinophils Relative: 4 %
HCT: 40 % (ref 36.0–46.0)
Hemoglobin: 13.1 g/dL (ref 12.0–15.0)
Immature Granulocytes: 0 %
Lymphocytes Relative: 14 %
Lymphs Abs: 1.1 10*3/uL (ref 0.7–4.0)
MCH: 30.7 pg (ref 26.0–34.0)
MCHC: 32.8 g/dL (ref 30.0–36.0)
MCV: 93.7 fL (ref 80.0–100.0)
Monocytes Absolute: 0.7 10*3/uL (ref 0.1–1.0)
Monocytes Relative: 10 %
Neutro Abs: 5.5 10*3/uL (ref 1.7–7.7)
Neutrophils Relative %: 71 %
Platelet Count: 255 10*3/uL (ref 150–400)
RBC: 4.27 MIL/uL (ref 3.87–5.11)
RDW: 14.1 % (ref 11.5–15.5)
WBC Count: 7.6 10*3/uL (ref 4.0–10.5)
nRBC: 0 % (ref 0.0–0.2)

## 2021-02-26 LAB — CMP (CANCER CENTER ONLY)
ALT: <5 U/L (ref 0–44)
AST: 19 U/L (ref 15–41)
Albumin: 4.3 g/dL (ref 3.5–5.0)
Alkaline Phosphatase: 50 U/L (ref 38–126)
Anion gap: 8 (ref 5–15)
BUN: 25 mg/dL — ABNORMAL HIGH (ref 8–23)
CO2: 28 mmol/L (ref 22–32)
Calcium: 9.1 mg/dL (ref 8.9–10.3)
Chloride: 99 mmol/L (ref 98–111)
Creatinine: 1.24 mg/dL — ABNORMAL HIGH (ref 0.44–1.00)
GFR, Estimated: 45 mL/min — ABNORMAL LOW (ref >60)
Glucose, Bld: 112 mg/dL — ABNORMAL HIGH (ref 70–99)
Potassium: 4 mmol/L (ref 3.5–5.1)
Sodium: 135 mmol/L (ref 135–145)
Total Bilirubin: 0.4 mg/dL (ref 0.3–1.2)
Total Protein: 7.2 g/dL (ref 6.5–8.1)

## 2021-02-26 NOTE — Progress Notes (Signed)
[place]   [phone] [phone]   Clinic Follow up Note   Patient Care Team: Leamon Arnt, MD as PCP - General (Family Medicine) Kathrynn Ducking, MD as Consulting Physician (Neurology) Bobbitt, Sedalia Muta, MD as Consulting Physician (Allergy and Immunology) Mansouraty, Telford Nab., MD as Consulting Physician (Gastroenterology) Truitt Merle, MD as Consulting Physician (Oncology) Lyndee Hensen, PT as Physical Therapist (Physical Therapy) Trula Slade, DPM as Consulting Physician (Podiatry) Star Age, MD as Attending Physician (Neurology)  Date of Service:  02/26/2021  CHIEF COMPLAINT: f/u of breast cancer, h/o colon cancer  CURRENT THERAPY:  Surveillance  ASSESSMENT & PLAN:  Julia [name] is a 80 y.o. female with   1. 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 She was reportedly intolerant to AI and took tamoxifen through 07/2020.  -most recent MM on 09/20/20 was negative -From a breast cancer standpoint, She is clinically doing well. Lab reviewed, her CBC and CMP are within normal limits. Physical exam was unremarkable. There is no clinical concern for recurrence. -Continue surveillance. Next Mammogram in 08/2021.  -I am comfortable releasing her to continue f/u with her PCP, whom she sees every 6 months or so. We will see her at any point in the future if needed.   2. 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 She is on Gabapentin. I recommended  she take B12 supplement. -Last colonoscopy in 2018. Continue to F/u with Dr. Rush Landmark     3. Left hip pain, arthritis  -Gradual onset around Oct 2020, questionably after she began prolia injections  -04/13/19 Lumbar MRI showed degenerative disease. Bone scan on 04/13/19 was negative for osseous metastasis, did show incidental uptake in 8th rib which coincides with minimally displaced fracture of the posterolateral aspect of the right eighth rib on CT chest from 06/16/19  -Pain has worsened in the past 6 months. Given improvement with exercise, this is likely from Osteoarthritis.   -I recommend she remain active with exercise.    4. Genetic testing from 01/2015 was negative for pathogenetic mutations.    5. Osteoporosis  -T-score is -2.7 at [facility] in 2018, and worsened to -3.9 at [facility] in 2020. -previously on Prolia, cal+vit D, and tamoxifen -repeat DEXA 09/20/20 showed improvement to -3.5.   6. Chronic conditions - parkinson's diease, thyroid disease  -Followed by neurology, PCP -She notes recent significant fatigue right before taking her medication. I recommend she f/u with Dr Juleen China about this.     PLAN: -Mammogram 08/2021 -f/u open   No problem-specific Assessment & Plan notes found for this encounter.   SUMMARY OF ONCOLOGIC HISTORY: 1. H/o Adenocarcinoma of the cecum in 12/2000  -s/p right hemicolectomy and 6 months of adjuvant 5FU-based chemotherapy.  -Reportedly had recurrence in the pelvis/ovary in 08/2002 and underwent TAH/BSO, omenectomy, LN dissection, and resection of peritoneal tumors  -additional 6 months of FOLFOX chemo   2. H/o breast cancer found on screening mammogram in 01/2015.  -Diagnostic mammo/US demonstrated a palpable 18 mm mass at the 2 o'clock  position left breast, path showed invasive ductal carcinoma, Nottingham grade 2, reportedly ER+/PR-/HER2-.  -S/p left lumpectomy with SLN biopsy in 02/2015, path showed 2 cm IDC, grade 2, 2/5 SLN positive for  carcinoma.  -S/p adjuvant radiation in 2017 -Began anti-estrogen therapy in early 2017. Did not tolerate AI. On tamoxifen currently.  -Genetic testing with Custom-Next cancer analysis panel including lynch syndrome and BRCA1/2 among others, which was negative.    INTERVAL HISTORY:  Julia [name] is here for a follow up of breast cancer. She was last seen by me a year ago. She presents to the clinic alone.   All other systems were reviewed with the patient and are negative.  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  ALLERGIES:  is allergic to cefdinir, cholestyramine, contrast media [iodinated diagnostic agents], ezetimibe, meperidine, nitrofurantoin, statins, and teriparatide.  MEDICATIONS:  Current Outpatient Medications  Medication Sig Dispense Refill   Acetaminophen (TYLENOL PO) Take by mouth as needed.     Calcium Citrate-Vitamin D 315-250 MG-UNIT TABS Take by mouth.     carbidopa-levodopa (SINEMET CR) 50-200 MG tablet Take 1 tablet by mouth 3 (three) times daily. 270 tablet 1   cycloSPORINE (RESTASIS) 0.05 % ophthalmic emulsion SMARTSIG:In Eye(s)     denosumab (PROLIA) 60 MG/ML SOSY injection Inject 60 mg into the skin every 6 (six) months.     diphenhydrAMINE  (BENADRYL) 12.5 MG chewable tablet Chew 12.5 mg by mouth every 6 (six) hours as needed.     EPINEPHrine 0.3 mg/0.3 mL IJ SOAJ injection INJECT INTO THE MIDDLE OF THE OUTER THIGH AND HOLD FOR 3 SECONDS AS NEEDED FOR SEVERE ALLERGIC REACTION THEN CALL 911 IF USED. 2 each 1   fluticasone (FLONASE) 50 MCG/ACT nasal spray PLACE 2 SPRAYS IN EACH NOSTRIL DAILY AS NEEDED FOR ALLERGIES OR RHINITIS 48 g 3   levothyroxine (SYNTHROID) 88 MCG tablet TAKE ONE TABLET BY MOUTH DAILY FOR BREAKFAST 90 tablet 0   Melatonin 1 MG CAPS Take by mouth as needed.     Multiple Vitamins-Minerals (CENTRUM SILVER PO) Take by mouth.     naproxen sodium (ALEVE) 220 MG tablet Take 220 mg by mouth as needed.     NON FORMULARY [facility]  Creams-#11 Peripheral Neuropathy cream     pramipexole (MIRAPEX) 0.25 MG tablet TAKE 1 TABLET BY MOUTH AT BEDTIME 90 tablet 1   Probiotic Product (PROBIOTIC DAILY PO) Take by mouth.     SALINE NASAL MIST NA Place into the nose.     sulfamethoxazole-trimethoprim (BACTRIM DS) 800-160 MG tablet Take 1 tablet by mouth 2 (two) times  daily after a meal. 10 tablet 0   traMADol (ULTRAM) 50 MG tablet Take 1 tablet (50 mg total) by mouth 2 (two) times daily as needed for moderate pain. 20 tablet 1   UNABLE TO FIND Med Name: Allergy shots 1/week     No current facility-administered medications for this visit.    PHYSICAL EXAMINATION: ECOG PERFORMANCE STATUS: 1 - Symptomatic but completely ambulatory  Vitals:   02/26/21 1249  BP: (!) 145/89  Pulse: 89  Resp: 18  Temp: 98.1 F (36.7 C)  SpO2: 98%   Wt Readings from Last 3 Encounters:  02/26/21 137 lb 12.8 oz (62.5 kg)  02/25/21 137 lb 2 oz (62.2 kg)  02/05/21 134 lb (60.8 kg)     GENERAL:alert, no distress and comfortable SKIN: skin color, texture, turgor are normal, no rashes or significant lesions EYES: normal, Conjunctiva are pink and non-injected, sclera clear  NECK: supple, thyroid normal size, non-tender, without  nodularity LYMPH:  no palpable lymphadenopathy in the cervical, axillary  LUNGS: clear to auscultation and percussion with normal breathing effort HEART: regular rate & rhythm and no murmurs and no lower extremity edema ABDOMEN:abdomen soft, non-tender and normal bowel sounds Musculoskeletal:no cyanosis of digits and no clubbing  NEURO: alert & oriented x 3 with fluent speech, no focal motor/sensory deficits BREAST: No palpable mass, nodules or adenopathy bilaterally. Breast exam benign.   LABORATORY DATA:  I have reviewed the data as listed CBC Latest Ref Rng & Units 02/26/2021 02/05/2021 02/26/2020  WBC 4.0 - 10.5 K/uL 7.6 5.2 7.0  Hemoglobin 12.0 - 15.0 g/dL 13.1 13.0 13.2  Hematocrit 36.0 - 46.0 % 40.0 38.9 39.7  Platelets 150 - 400 K/uL 255 229.0 212     CMP Latest Ref Rng & Units 02/26/2021 02/05/2021 02/26/2020  Glucose 70 - 99 mg/dL 112(H) 104(H) 198(H)  BUN 8 - 23 mg/dL 25(H) 19 19  Creatinine 0.44 - 1.00 mg/dL 1.24(H) 0.92 1.09(H)  Sodium 135 - 145 mmol/L 135 134(L) 138  Potassium 3.5 - 5.1 mmol/L 4.0 4.2 3.9  Chloride 98 - 111 mmol/L 99 99 102  CO2 22 - 32 mmol/L _0 Calcium 8.9 - 10.3 mg/dL 9.1 8.9 9.4  Total Protein 6.5 - 8.1 g/dL 7.2 6.5 6.7  Total Bilirubin 0.3 - 1.2 mg/dL 0.4 0.5 0.4  Alkaline Phos 38 - 126 U/L 50 41 37(L)  AST 15 - 41 U/L _1 ALT 0 - 44 U/L <_2 RADIOGRAPHIC STUDIES: I have personally reviewed the radiological images as listed and agreed with the findings in the report. No results found.    No orders of the defined types were placed in this encounter.  All questions were answered. The patient knows to call the clinic with any problems, questions or concerns. No barriers to learning was detected. The total time spent in the appointment was 30 minutes.     Truitt Merle, MD 02/26/2021   I, Wilburn Mylar, am acting as scribe for Truitt Merle, MD.   I have reviewed the above documentation for accuracy and completeness, and  I agree with the above.

## 2021-02-28 LAB — URINE CULTURE
MICRO NUMBER:: 12690060
SPECIMEN QUALITY:: ADEQUATE

## 2021-03-04 ENCOUNTER — Other Ambulatory Visit: Payer: Self-pay

## 2021-03-04 ENCOUNTER — Ambulatory Visit (INDEPENDENT_AMBULATORY_CARE_PROVIDER_SITE_OTHER): Payer: Medicare HMO

## 2021-03-04 DIAGNOSIS — J309 Allergic rhinitis, unspecified: Secondary | ICD-10-CM | POA: Diagnosis not present

## 2021-03-09 ENCOUNTER — Ambulatory Visit (HOSPITAL_COMMUNITY)
Admission: EM | Admit: 2021-03-09 | Discharge: 2021-03-09 | Disposition: A | Discharge status: Home / Self Care | Payer: Medicare HMO | Attending: Internal Medicine | Admitting: Internal Medicine

## 2021-03-09 DIAGNOSIS — N390 Urinary tract infection, site not specified: Secondary | ICD-10-CM | POA: Diagnosis not present

## 2021-03-09 LAB — POCT URINALYSIS DIPSTICK, ED / UC
Bilirubin Urine: NEGATIVE
Glucose, UA: NEGATIVE mg/dL
Ketones, ur: NEGATIVE mg/dL
Nitrite: NEGATIVE
Protein, ur: NEGATIVE mg/dL
Specific Gravity, Urine: 1.015 (ref 1.005–1.030)
Urobilinogen, UA: 0.2 mg/dL (ref 0.0–1.0)
pH: 6 (ref 5.0–8.0)

## 2021-03-09 MED ORDER — SULFAMETHOXAZOLE-TRIMETHOPRIM 800-160 MG PO TABS: 1.0000 | ORAL_TABLET | Freq: Two times a day (BID) | ORAL | 0 refills | Status: AC

## 2021-03-09 MED ORDER — SULFAMETHOXAZOLE-TRIMETHOPRIM 800-160 MG PO TABS: 1.0000 | ORAL_TABLET | Freq: Two times a day (BID) | ORAL | 0 refills | Status: DC

## 2021-03-09 NOTE — ED Provider Notes (Addendum)
[facility]   CC: Burning with urination  SUBJECTIVE:  Julia [name] is a 80 y.o. female w who presented to the urgent care with a complaint of dysuria for the past few days.  She reports she was treated for UTI 3 weeks ago with Macrobid.  She stated after completing the antibiotic her symptoms returned.  Localizes the pain to the lower abdomen.  Pain is intermittent and describes it as achy.  Has not tried any OTC medications.  Symptoms are made worse with urination.  Admits to similar symptoms in the past.  Denies fever, chills, nausea, vomiting, abdominal pain, flank pain, abnormal vaginal discharge or bleeding, hematuria.    LMP: No LMP recorded. Patient has had a hysterectomy.  ROS: As in HPI.  All other pertinent ROS negative.     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   Current Outpatient Medications on File Prior to Encounter  Medication Sig Dispense Refill   Acetaminophen (TYLENOL PO) Take by mouth as needed.     Calcium Citrate-Vitamin D 315-250 MG-UNIT TABS Take by mouth.     carbidopa-levodopa (SINEMET CR) 50-200 MG  tablet Take 1 tablet by mouth 3 (three) times daily. 270 tablet 1   cycloSPORINE (RESTASIS) 0.05 % ophthalmic emulsion SMARTSIG:In Eye(s)     denosumab (PROLIA) 60 MG/ML SOSY injection Inject 60 mg into the skin every 6 (six) months.     diphenhydrAMINE (BENADRYL) 12.5 MG chewable tablet Chew 12.5 mg by mouth every 6 (six) hours as needed.     EPINEPHrine 0.3 mg/0.3 mL IJ SOAJ injection INJECT INTO THE MIDDLE OF THE OUTER THIGH AND HOLD FOR 3 SECONDS AS NEEDED FOR SEVERE ALLERGIC REACTION THEN CALL 911 IF USED. 2 each 1   fluticasone (FLONASE) 50 MCG/ACT nasal spray PLACE 2 SPRAYS IN EACH NOSTRIL DAILY AS NEEDED FOR ALLERGIES OR RHINITIS 48 g 3   levothyroxine (SYNTHROID) 88 MCG tablet TAKE ONE TABLET BY MOUTH DAILY FOR BREAKFAST 90 tablet 0   Melatonin 1 MG CAPS Take by mouth as needed.     Multiple Vitamins-Minerals (CENTRUM SILVER PO) Take by mouth.     naproxen sodium (ALEVE) 220 MG tablet Take 220 mg by mouth as needed.     NON FORMULARY [facility]  Creams-#11 Peripheral Neuropathy cream     pramipexole (MIRAPEX) 0.25 MG tablet TAKE 1 TABLET BY MOUTH AT BEDTIME 90 tablet 1   Probiotic  Product (PROBIOTIC DAILY PO) Take by mouth.     SALINE NASAL MIST NA Place into the nose.     traMADol (ULTRAM) 50 MG tablet Take 1 tablet (50 mg total) by mouth 2 (two) times daily as needed for moderate pain. 20 tablet 1   UNABLE TO FIND Med Name: Allergy shots 1/week     Social History   Socioeconomic History   Marital status: Married    Spouse name: Not on file   Number of children: Not on file   Years of education: 3   Highest education level: Master's degree (e.g., MA, MS, MEng, MEd, MSW, MBA)  Occupational History   Occupation: Retired Pharmacist, hospital   Tobacco Use   Smoking status: Never   Smokeless tobacco: Never  Vaping Use   Vaping Use: Never used  Substance and Sexual Activity   Alcohol use: Yes    Comment: occ   Drug use: Never   Sexual activity: Not on file  Other Topics  Concern   Not on file  Social History Narrative   Right handed    Lives with husband    Caffeine 1 cup daily    Social Determinants of Health   Financial Resource Strain: Low Risk    Difficulty of Paying Living Expenses: Not hard at all  Food Insecurity: No Food Insecurity   Worried About Charity fundraiser in the Last Year: Never true   [place] in the Last Year: Never true  Transportation Needs: No Transportation Needs   Lack of Transportation (Medical): No   Lack of Transportation (Non-Medical): No  Physical Activity: Sufficiently Active   Days of Exercise per Week: 7 days   Minutes of Exercise per Session: 40 min  Stress: No Stress Concern Present   Feeling of Stress : Not at all  Social Connections: Socially Integrated   Frequency of Communication with Friends and Family: More than three times a week   Frequency of Social Gatherings with Friends and Family: More than three times a week   Attends Religious Services: More than 4 times per year   Active Member of [employer] or Organizations: Yes   Attends Archivist Meetings: 1 to 4 times per year   Marital Status: Married  Human resources officer Violence: Not At Risk   Fear of Current or Ex-Partner: No   Emotionally Abused: No   Physically Abused: No   Sexually Abused: No   Family History  Problem Relation Age of Onset   Arthritis Mother    Diabetes Mother    Heart disease Mother    Hypertension Mother    Arthritis Father    Cancer Father    Depression Father    Osteoporosis Father    Arthritis Sister    Cancer Sister    Diabetes Sister    Cancer Brother    Cancer Sister    Cancer Sister    Hyperlipidemia Sister    Hyperlipidemia Brother     OBJECTIVE:  Vitals:   03/09/21 1037  BP: 121/77  Pulse: 91  Resp: 18  Temp: 97.6 F (36.4 C)  TempSrc: Oral  SpO2: 100%   General appearance: AOx3 in no acute distress HEENT: NCAT.  Oropharynx clear.  Lungs: clear to auscultation bilaterally without  adventitious breath sounds Heart: regular rate and rhythm.  Radial pulses 2+ symmetrical bilaterally Abdomen: soft; non-distended; no tenderness; bowel sounds present; no guarding or rebound tenderness Back: no CVA tenderness Extremities: no edema; symmetrical with no gross  deformities Skin: warm and dry Neurologic: Ambulates from chair to exam table without difficulty Psychological: alert and cooperative; normal mood and affect  Labs Reviewed  POCT URINALYSIS DIPSTICK, ED / UC - Abnormal; Notable for the following components:      Result Value   Hgb urine dipstick TRACE (*)    Leukocytes,Ua SMALL (*)    All other components within normal limits    ASSESSMENT & PLAN:  1. Lower urinary tract infectious disease     Meds ordered this encounter  Medications   DISCONTD: sulfamethoxazole-trimethoprim (BACTRIM DS) 800-160 MG tablet    Sig: Take 1 tablet by mouth 2 (two) times daily for 7 days.    Dispense:  14 tablet    Refill:  0   sulfamethoxazole-trimethoprim (BACTRIM DS) 800-160 MG tablet    Sig: Take 1 tablet by mouth 2 (two) times daily for 7 days.    Dispense:  14 tablet    Refill:  0    Discharge instructions  Urine culture sent.  We will call you with the results.   Push fluids and get plenty of rest.   Take antibiotic as directed and to completion Follow up with PCP if symptoms persists Return here or go to ER if you have any new or worsening symptoms such as fever, worsening abdominal pain, nausea/vomiting, flank pain, etc...  Outlined signs and symptoms indicating need for more acute intervention. Patient verbalized understanding. After Visit Summary given.      Emerson Monte, FNP 03/09/21 1108    Emerson Monte, FNP 03/09/21 1123

## 2021-03-09 NOTE — ED Triage Notes (Signed)
Pt presents with Dysuria for several days.

## 2021-03-09 NOTE — Discharge Instructions (Addendum)
Urine culture sent.  We will call you with the results.   Push fluids and get plenty of rest.   Take antibiotic as directed and to completion Follow up with PCP if symptoms persists Return here or go to ER if you have any new or worsening symptoms such as fever, worsening abdominal pain, nausea/vomiting, flank pain, etc... 

## 2021-03-10 ENCOUNTER — Ambulatory Visit: Payer: Medicare HMO | Admitting: Podiatry

## 2021-03-19 ENCOUNTER — Encounter: Payer: Self-pay | Admitting: Family Medicine

## 2021-03-19 ENCOUNTER — Ambulatory Visit (INDEPENDENT_AMBULATORY_CARE_PROVIDER_SITE_OTHER): Payer: Medicare HMO | Admitting: Family Medicine

## 2021-03-19 VITALS — BP 128/78 | HR 91 | Temp 97.8°F | Ht 65.0 in | Wt 135.8 lb

## 2021-03-19 DIAGNOSIS — M545 Low back pain, unspecified: Secondary | ICD-10-CM | POA: Diagnosis not present

## 2021-03-19 DIAGNOSIS — M62838 Other muscle spasm: Secondary | ICD-10-CM

## 2021-03-19 DIAGNOSIS — M4722 Other spondylosis with radiculopathy, cervical region: Secondary | ICD-10-CM

## 2021-03-19 DIAGNOSIS — G8929 Other chronic pain: Secondary | ICD-10-CM | POA: Diagnosis not present

## 2021-03-19 MED ORDER — PREDNISONE 10 MG PO TABS: ORAL_TABLET | ORAL | 0 refills | Status: DC

## 2021-03-19 MED ORDER — TRAMADOL HCL 50 MG PO TABS: 50.0000 mg | ORAL_TABLET | Freq: Two times a day (BID) | ORAL | 1 refills | Status: DC | PRN

## 2021-03-19 MED ORDER — METHOCARBAMOL 500 MG PO TABS: 500.0000 mg | ORAL_TABLET | Freq: Two times a day (BID) | ORAL | 0 refills | Status: DC | PRN

## 2021-03-19 NOTE — Progress Notes (Signed)
Subjective  CC:  Chief Complaint  Patient presents with   Shoulder Pain    Right shoulder moves into neck, ongoing since 03/13/21   Hip Pain    Left hip moves down into thigh, ongoing for about a year or more    HPI: Julia [name] is a 81 y.o. female who presents to the office today to address the problems listed above in the chief complaint. 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  Started 5 or 6 days ago acutely.  Complains of pain with turning her head to the right.  Also with pain when trying to sit up from a lying position.  She is sore on the right side of her neck.  Some pain radiates into the shoulder.  No significant pain with moving her arm.  She is able to move her arm above her head.  No injury.  No weakness.  No trauma.  She has tried ibuprofen with acetaminophen which does help relieve the pain but it does return.  Pain is interfered with activity and sleep. Chronic low back pain/hip pain: Seeing neurosurgery.  Has had some injections which have been helpful.  Assessment  1. Osteoarthritis of spine with radiculopathy, cervical region   2. Muscle spasm   3. Chronic midline low back pain without sciatica      Plan  Suspect osteoarthritis of cervical spine with impinged nerve root: Education counseling given.  Also with significant muscle tenderness and spasm.  Recommend moist heat, range of motion exercises, muscle relaxer, prednisone taper and she may try tramadol in addition to ibuprofen if needed and as needed for pain.  No red flag symptoms.  Follow-up if not improving. DJD lumbar spine, chronic pain: Continue follow-up with neurosurgeon.  Follow up: If not improving 08/08/2021  No orders of the defined types were placed in this encounter.  Meds ordered this encounter  Medications   predniSONE (DELTASONE) 10 MG tablet    Sig: Take 4 tabs qd x 2 days, 3 qd x 2 days, 2 qd x 2d, 1qd x 3 days    Dispense:  21 tablet     Refill:  0   methocarbamol (ROBAXIN) 500 MG tablet    Sig: Take 1 tablet (500 mg total) by mouth 2 (two) times daily as needed for muscle spasms.    Dispense:  30 tablet    Refill:  0   traMADol (ULTRAM) 50 MG tablet    Sig: Take 1 tablet (50 mg total) by mouth 2 (two) times daily as needed for moderate pain.    Dispense:  20 tablet    Refill:  1      I reviewed the patients updated PMH, FH, and SocHx.    Patient Active Problem List   Diagnosis Date Noted   Statin intolerance 10/13/2018    Priority: High   Parkinson's disease ([place]) 09/29/2017    Priority: High   Age-related osteoporosis without current pathological fracture 07/20/2016    Priority: High   Mixed hyperlipidemia 03/07/2015    Priority: High   Impaired fasting glucose 03/07/2015    Priority: High   History of colon cancer 02/13/2015    Priority: High   Hypothyroidism due to Hashimoto's thyroiditis 02/13/2015    Priority: High   History of breast cancer left, 2016 02/13/2015    Priority: High   Multiple pulmonary nodules 06/20/2019    Priority: Medium    RLS (restless legs  syndrome) 07/14/2018    Priority: Medium    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    Priority: Medium    Anxiety 09/29/2017    Priority: Medium    Dysfunctional voiding of urine 09/13/2016    Priority: Medium    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    Priority: Medium    Gastroesophageal reflux disease 09/13/2016    Priority: Medium    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    Priority: Medium    Irritable bowel syndrome with both constipation and diarrhea 02/13/2015    Priority: Medium    Pelvic floor dysfunction 08/26/2018    Priority: Low   Female bladder prolapse 04/07/2018    Priority: Low   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   Calcium Citrate-Vitamin D 315-250 MG-UNIT TABS Take by mouth.   carbidopa-levodopa (SINEMET CR) 50-200 MG tablet Take 1 tablet by mouth 3 (three) times daily.   cycloSPORINE (RESTASIS) 0.05 % ophthalmic emulsion SMARTSIG:In Eye(s)   denosumab (PROLIA) 60 MG/ML SOSY injection Inject 60 mg into the skin every 6 (six) months.   diphenhydrAMINE (BENADRYL) 12.5 MG chewable tablet Chew 12.5 mg by mouth every 6 (six) hours as needed.   EPINEPHrine 0.3 mg/0.3 mL IJ SOAJ injection INJECT INTO THE MIDDLE OF THE OUTER THIGH AND HOLD FOR 3 SECONDS AS NEEDED FOR SEVERE ALLERGIC REACTION THEN CALL 911 IF USED.   fluticasone (FLONASE) 50 MCG/ACT nasal spray PLACE 2 SPRAYS IN EACH NOSTRIL DAILY AS NEEDED FOR ALLERGIES OR RHINITIS   levothyroxine (SYNTHROID) 88 MCG tablet TAKE ONE TABLET BY MOUTH DAILY FOR BREAKFAST   Melatonin 1 MG CAPS Take by mouth as needed.   methocarbamol (ROBAXIN) 500 MG tablet Take 1 tablet (500 mg total) by mouth 2 (two) times daily as needed for muscle spasms.   Multiple Vitamins-Minerals (CENTRUM SILVER PO) Take by mouth.   naproxen sodium (ALEVE) 220 MG tablet Take 220 mg by mouth as needed.   NON FORMULARY [facility]  Creams-#11 Peripheral Neuropathy cream   pramipexole (MIRAPEX) 0.25 MG tablet TAKE 1 TABLET BY MOUTH AT BEDTIME   predniSONE (DELTASONE) 10 MG tablet Take 4 tabs qd x 2 days, 3 qd x 2 days, 2 qd x 2d, 1qd x 3 days   Probiotic Product (PROBIOTIC DAILY PO) Take by mouth.   SALINE NASAL MIST NA Place into the nose.   UNABLE TO FIND Med Name: Allergy shots 1/week    Allergies: Patient is allergic to cefdinir, cholestyramine, contrast media [iodinated diagnostic agents], ezetimibe, meperidine, nitrofurantoin, statins, and teriparatide. Family History: Patient family history includes  Arthritis in her father, mother, and sister; Cancer in her brother, father, sister, sister, and sister; Depression in her father; Diabetes in her mother and sister; Heart disease in her mother; Hyperlipidemia in her brother and sister; Hypertension in her mother; Osteoporosis in her father. Social History:  Patient  reports that she has never smoked. She has never used smokeless tobacco. She reports current alcohol use. She reports that she does not use drugs.  Review of Systems: Constitutional: Negative for fever malaise or anorexia Cardiovascular: negative  for chest pain Respiratory: negative for SOB or persistent cough Gastrointestinal: negative for abdominal pain  Objective  Vitals: BP 128/78    Pulse 91    Temp 97.8 F (36.6 C) (Temporal)    Ht 5\' 5"  (1.651 m)    Wt 135 lb 12.8 oz (61.6 kg)    SpO2 98%    BMI 22.60 kg/m  General: no acute distress , A&Ox3, normal gait HEENT: Right-sided cervical spine tenderness without step-off present, strap muscles are tender, decreased right rotation due to pain, normal left rotation and extension, tender right trap Right shoulder: Normal exam   Commons side effects, risks, benefits, and alternatives for medications and treatment plan prescribed today were discussed, and the patient expressed understanding of the given instructions. Patient is instructed to call or message via MyChart if he/she has any questions or concerns regarding our treatment plan. No barriers to understanding were identified. We discussed Red Flag symptoms and signs in detail. Patient expressed understanding regarding what to do in case of urgent or emergency type symptoms.  Medication list was reconciled, printed and provided to the patient in AVS. Patient instructions and summary information was reviewed with the patient as documented in the AVS. This note was prepared with assistance of Dragon voice recognition software. Occasional wrong-word or sound-a-like substitutions may  have occurred due to the inherent limitations of voice recognition software  This visit occurred during the SARS-CoV-2 public health emergency.  Safety protocols were in place, including screening questions prior to the visit, additional usage of staff PPE, and extensive cleaning of exam room while observing appropriate contact time as indicated for disinfecting solutions.

## 2021-03-19 NOTE — Patient Instructions (Signed)
Please follow up as scheduled for your next visit with me: 08/08/2021   Your pain is likely coming from your neck; I suspect you have arthritis in your neck like you do in your lower spine. There is also some secondary muscle spasm and nerve root irritation.  Please take the prednisone as prescribed x 7 days.  You may use the muscle relaxer and tramadol (pain medication you should have at home already) as needed.  Heat will help as well.   If you have any questions or concerns, please don't hesitate to send me a message via MyChart or call the office at [phone]. Thank you for visiting with [place] today! It's our pleasure caring for you.   Cervical Radiculopathy Cervical radiculopathy happens when a nerve in the neck (a cervical nerve) is pinched or bruised. This condition can happen because of an injury to the cervical spine (vertebrae) in the neck, or as part of the normal aging process. Pressure on the cervical nerves can cause pain or numbness that travels from the neck all the way down to the arm and fingers. This condition usually gets better with rest. Treatment may be needed if the condition does not improve. What are the causes? This condition may be caused by: A neck injury. A bulging (herniated) disk. Muscle spasms. Muscle tightness in the neck due to overuse. Arthritis. Breakdown or degeneration in the bones and joints of the spine (spondylosis) due to aging. Bone spurs that may develop near the cervical nerves. What are the signs or symptoms? Symptoms of this condition include: Pain. The pain may travel from the neck to the arm and hand. The pain can be severe or irritating. It may get worse when you move your neck. Numbness or tingling in your arm or hand. Weakness in the affected arm and hand, in severe cases. How is this diagnosed? This condition may be diagnosed based on your symptoms, your medical history, and a physical exam. You may also have tests,  including: X-rays. CT scan. MRI. Electromyogram (EMG). Nerve conduction tests. How is this treated? In many cases, treatment is not needed for this condition. With rest, the condition usually gets better over time. If treatment is needed, options may include: Wearing a soft neck collar (cervical collar) for short periods of time. Doing physical therapy to strengthen your neck muscles. Taking medicines. These may include NSAIDs, such as ibuprofen, or oral corticosteroids. Having spinal injections, in severe cases. Having surgery. This may be needed if other treatments do not help. Different types of surgery may be done depending on the cause of this condition. Follow these instructions at home: If you have a cervical collar: Wear it as told by your health care provider. Remove it only as told by your health care provider. Ask your health care provider if you can remove the cervical collar for cleaning and bathing. If you are allowed to remove the collar for cleaning or bathing: Follow instructions from your health care provider about how to remove the collar safely. Clean the collar by wiping it with mild soap and water and drying it completely. Take out any removable pads in the collar every 1-2 days, and wash them by hand with soap and water. Let them air-dry completely before you put them back in the collar. Check your skin under the collar for irritation or sores. If you see any, tell your health care provider. Managing pain   Take over-the-counter and prescription medicines only as told by your health care  provider. If directed, put ice on the affected area. To do this: If you have a soft neck collar, remove it as told by your health care provider. Put ice in a plastic bag. Place a towel between your skin and the bag. Leave the ice on for 20 minutes, 2-3 times a day. Remove the ice if your skin turns bright red. This is very important. If you cannot feel pain, heat, or cold, you have  a greater risk of damage to the area. If applying ice does not help, you can try using heat. Use the heat source that your health care provider recommends, such as a moist heat pack or a heating pad. Place a towel between your skin and the heat source. Leave the heat on for 20-30 minutes. Remove the heat if your skin turns bright red. This is especially important if you are unable to feel pain, heat, or cold. You have a greater risk of getting burned. Try a gentle neck and shoulder massage to help relieve symptoms. Activity Rest as needed. Return to your normal activities as told by your health care provider. Ask your health care provider what activities are safe for you. Do stretching and strengthening exercises as told by your health care provider or your physical therapist. You may have to avoid lifting. Ask your health care provider how much you can safely lift. General instructions Use a flat pillow when you sleep. Do not drive while wearing a cervical collar. If you do not have a cervical collar, ask your health care provider if it is safe to drive while your neck heals. Ask your health care provider if the medicine prescribed to you requires you to avoid driving or using machinery. Do not use any products that contain nicotine or tobacco. These products include cigarettes, chewing tobacco, and vaping devices, such as e-cigarettes. If you need help quitting, ask your health care provider. Keep all follow-up visits. This is important. Contact a health care provider if: Your condition does not improve with treatment. Get help right away if: Your pain gets much worse and is not controlled with medicines. You have weakness or numbness in your hand, arm, face, or leg. You have a high fever. You have a stiff, rigid neck. You lose control of your bowels or your bladder (have incontinence). You have trouble with walking, balance, or speaking. Summary Cervical radiculopathy happens when a  nerve in the neck is pinched or bruised. A nerve can get pinched from a bulging disk, arthritis, muscle spasms, or an injury to the neck. Symptoms include pain, tingling, or numbness radiating from the neck to the arm or hand. Weakness can also occur in severe cases. Treatment may include rest, wearing a cervical collar, and physical therapy. Medicines may be prescribed to help with pain. In severe cases, injections or surgery may be needed. This information is not intended to replace advice given to you by your health care provider. Make sure you discuss any questions you have with your health care provider. Document Revised: 09/19/2020 Document Reviewed: 09/19/2020 Elsevier Patient Education  [place].

## 2021-03-21 ENCOUNTER — Telehealth: Payer: Self-pay

## 2021-03-25 ENCOUNTER — Ambulatory Visit (INDEPENDENT_AMBULATORY_CARE_PROVIDER_SITE_OTHER): Payer: Medicare HMO

## 2021-03-25 ENCOUNTER — Other Ambulatory Visit: Payer: Self-pay

## 2021-03-25 ENCOUNTER — Telehealth: Payer: Self-pay

## 2021-03-25 DIAGNOSIS — J309 Allergic rhinitis, unspecified: Secondary | ICD-10-CM

## 2021-03-25 MED ORDER — FLUTICASONE PROPIONATE 50 MCG/ACT NA SUSP: NASAL | 3 refills | Status: DC

## 2021-03-25 NOTE — Telephone Encounter (Signed)
Patient came in for allergy injection and needed a refill on Flonase

## 2021-03-25 NOTE — Telephone Encounter (Signed)
Patient came in for allergy injections and needed a refill on Flonase. Sent in 90 day supply to [facility] in [place] on [address].

## 2021-04-02 ENCOUNTER — Other Ambulatory Visit: Payer: Self-pay | Admitting: Family Medicine

## 2021-04-03 ENCOUNTER — Ambulatory Visit (INDEPENDENT_AMBULATORY_CARE_PROVIDER_SITE_OTHER): Payer: Medicare HMO

## 2021-04-03 ENCOUNTER — Other Ambulatory Visit: Payer: Self-pay

## 2021-04-03 DIAGNOSIS — J309 Allergic rhinitis, unspecified: Secondary | ICD-10-CM | POA: Diagnosis not present

## 2021-04-08 ENCOUNTER — Other Ambulatory Visit: Payer: Self-pay | Admitting: Family Medicine

## 2021-04-09 ENCOUNTER — Other Ambulatory Visit: Payer: Self-pay | Admitting: Rehabilitation

## 2021-04-09 ENCOUNTER — Ambulatory Visit: Payer: Medicare HMO | Admitting: Podiatry

## 2021-04-09 DIAGNOSIS — M5412 Radiculopathy, cervical region: Secondary | ICD-10-CM

## 2021-04-10 ENCOUNTER — Ambulatory Visit (INDEPENDENT_AMBULATORY_CARE_PROVIDER_SITE_OTHER): Payer: Medicare HMO

## 2021-04-10 ENCOUNTER — Other Ambulatory Visit: Payer: Self-pay

## 2021-04-10 DIAGNOSIS — J309 Allergic rhinitis, unspecified: Secondary | ICD-10-CM | POA: Diagnosis not present

## 2021-04-11 ENCOUNTER — Other Ambulatory Visit: Payer: Self-pay | Admitting: Family Medicine

## 2021-04-11 MED ORDER — TIZANIDINE HCL 4 MG PO TABS: 4.0000 mg | ORAL_TABLET | Freq: Two times a day (BID) | ORAL | 2 refills | Status: DC | PRN

## 2021-04-16 ENCOUNTER — Ambulatory Visit: Payer: Medicare HMO | Admitting: Neurology

## 2021-04-22 ENCOUNTER — Other Ambulatory Visit: Payer: Self-pay

## 2021-04-22 ENCOUNTER — Ambulatory Visit (INDEPENDENT_AMBULATORY_CARE_PROVIDER_SITE_OTHER): Payer: Medicare HMO

## 2021-04-22 DIAGNOSIS — J309 Allergic rhinitis, unspecified: Secondary | ICD-10-CM | POA: Diagnosis not present

## 2021-04-23 ENCOUNTER — Telehealth: Payer: Self-pay | Admitting: Family Medicine

## 2021-04-23 NOTE — Telephone Encounter (Signed)
Pt would like to have a 3 month refill of her medication:  levothyroxine (SYNTHROID) 88 MCG tablet  [facility] [id number] - [place], [place] Phone:  [phone]  Fax:  [phone]

## 2021-04-24 ENCOUNTER — Other Ambulatory Visit: Payer: Self-pay

## 2021-04-24 ENCOUNTER — Ambulatory Visit
Admission: RE | Admit: 2021-04-24 | Discharge: 2021-04-24 | Disposition: A | Discharge status: Home / Self Care | Payer: Medicare HMO | Source: Ambulatory Visit | Attending: Rehabilitation | Admitting: Rehabilitation

## 2021-04-24 DIAGNOSIS — M5412 Radiculopathy, cervical region: Secondary | ICD-10-CM

## 2021-04-24 IMAGING — MR MR CERVICAL SPINE W/O CM
5 series · 36 of 48 positions shown · non-contrast
Comparison: None.

CLINICAL DATA: Neck and right shoulder pain for 3 months.

EXAM:
MRI CERVICAL SPINE WITHOUT CONTRAST
TECHNIQUE: Multiplanar, multisequence MR imaging of the cervical spine was
performed. No intravenous contrast was administered.

[Series 2: T2 · sagittal · 3.0mm · 0.41mm/px · 8 of 17 slices shown (1 of 2)]
[im 1/17]
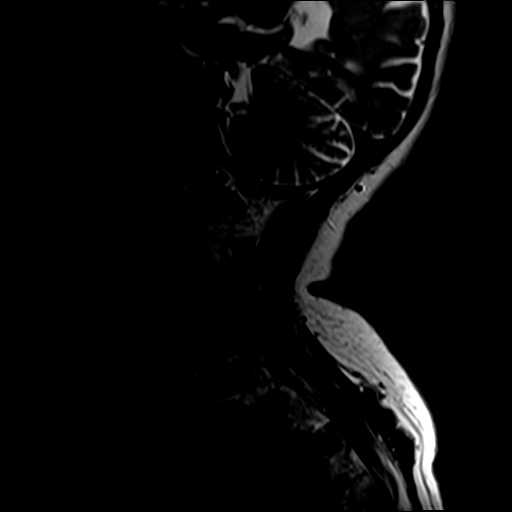
[im 3/17]
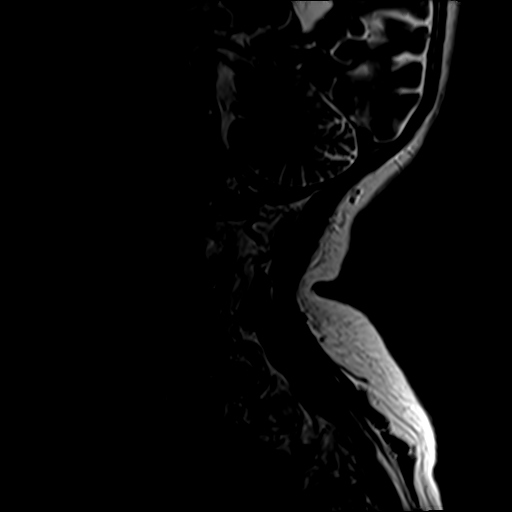
[im 5/17]
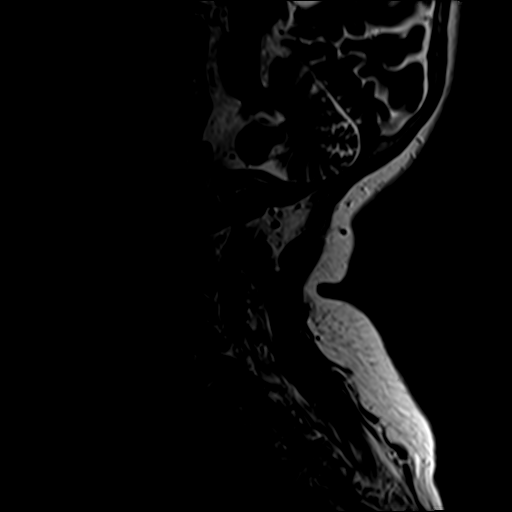
[im 7/17]
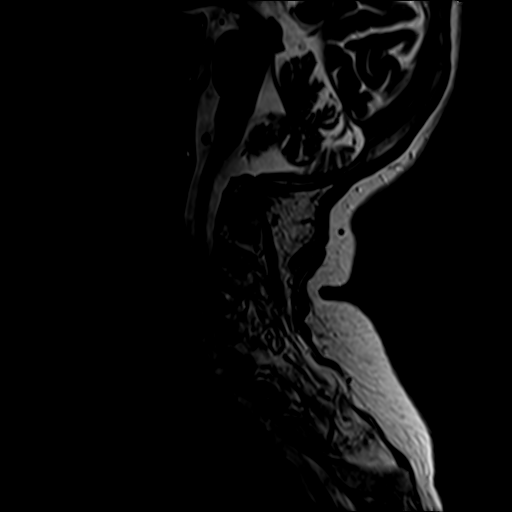
[im 10/17]
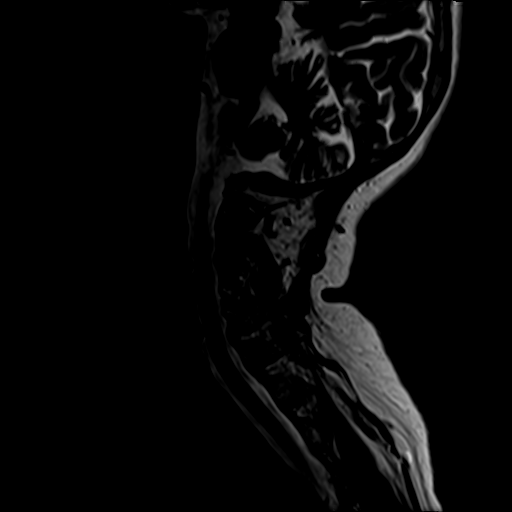
[im 12/17]
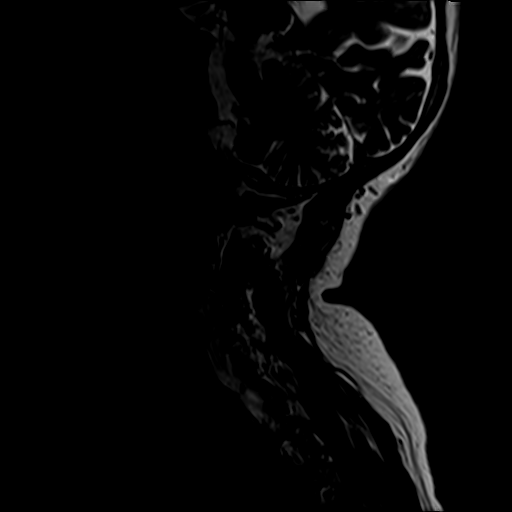
[im 14/17]
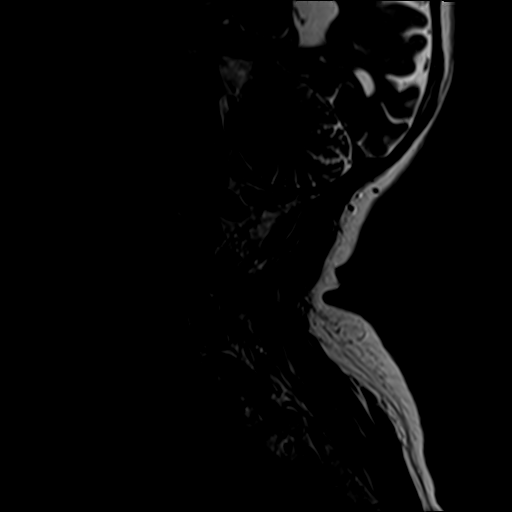
[im 17/17]
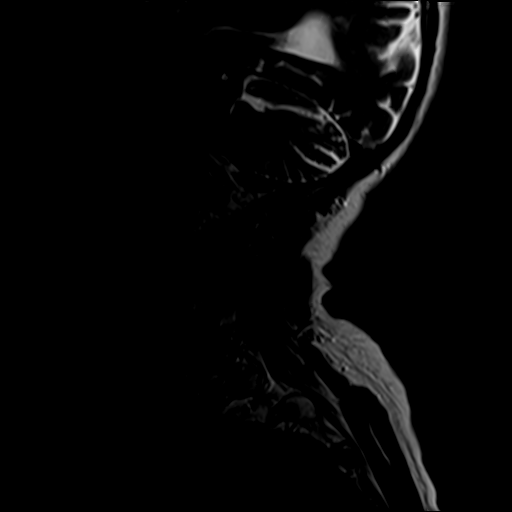

[Series 3: STIR · sagittal · 3.0mm · 0.82mm/px · 8 of 17 slices shown]
[im 1/17]
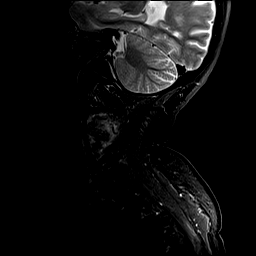
[im 3/17]
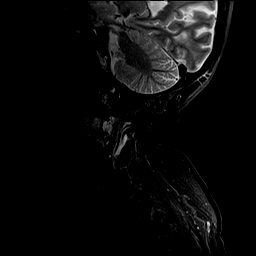
[im 5/17]
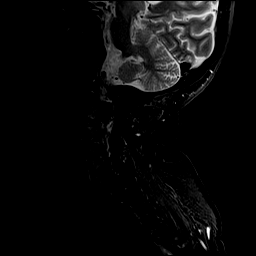
[im 7/17]
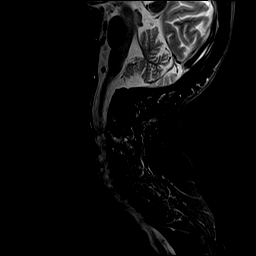
[im 10/17]
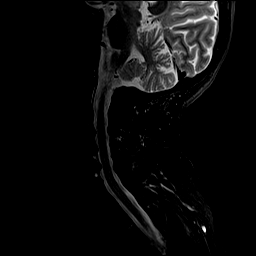
[im 12/17]
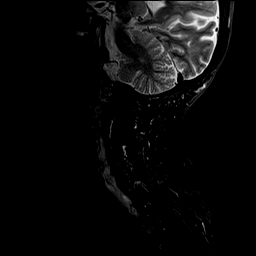
[im 14/17]
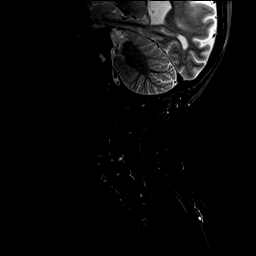
[im 17/17]
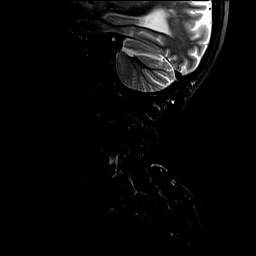

[Series 4: T1 · sagittal · 3.0mm · 0.82mm/px · 8 of 17 slices shown]
[im 1/17]
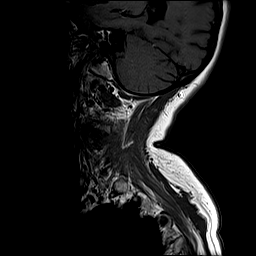
[im 3/17]
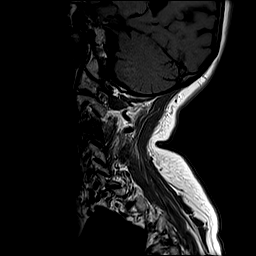
[im 5/17]
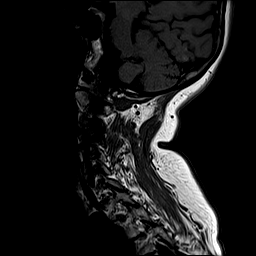
[im 7/17]
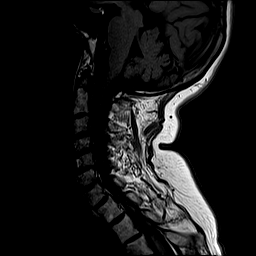
[im 10/17]
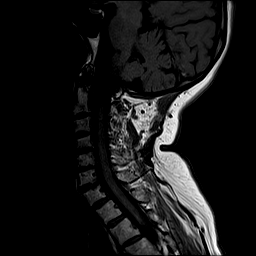
[im 12/17]
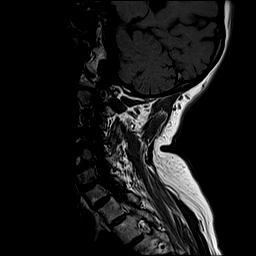
[im 14/17]
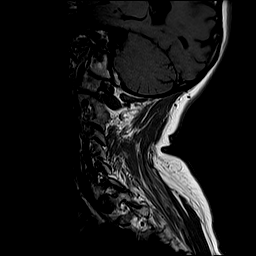
[im 17/17]
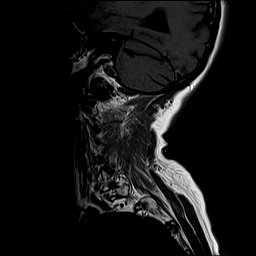

[Series 5: T2 · axial · 3.0mm · 0.70mm/px · z∈[-59,+22]mm · 9 of 24 slices shown (2 of 2)]
[im 1/24]
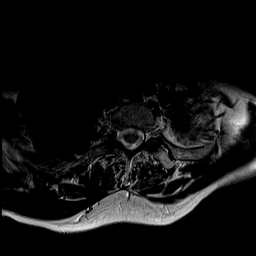
[im 5/24]
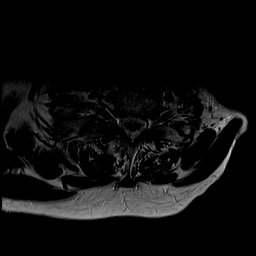
[im 7/24]
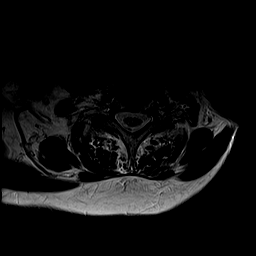
[im 11/24]
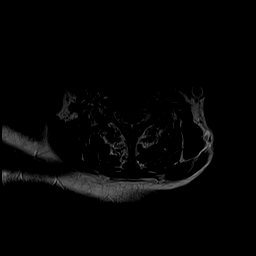
[im 13/24]
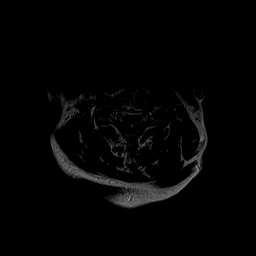
[im 17/24]
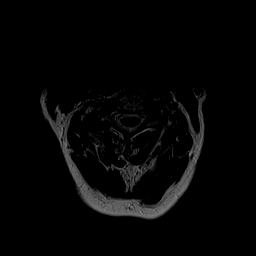
[im 19/24]
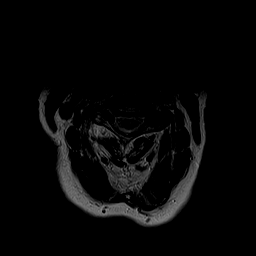
[im 21/24]
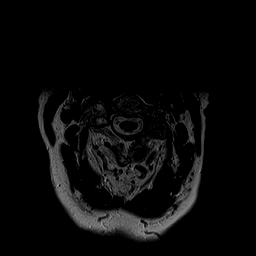
[im 24/24]
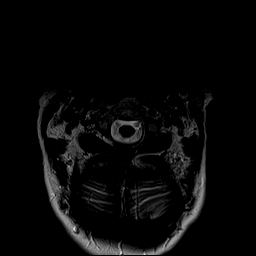

[Series 6: GRE · axial · 3.0mm · 0.35mm/px · z∈[-59,-38]mm · 3 of 24 slices shown]
[im 1/24]
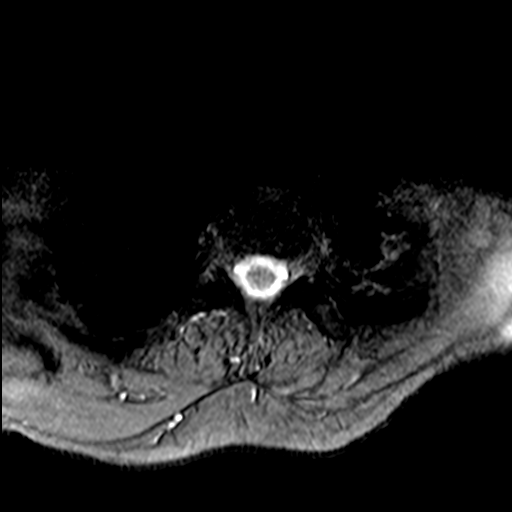
[im 5/24]
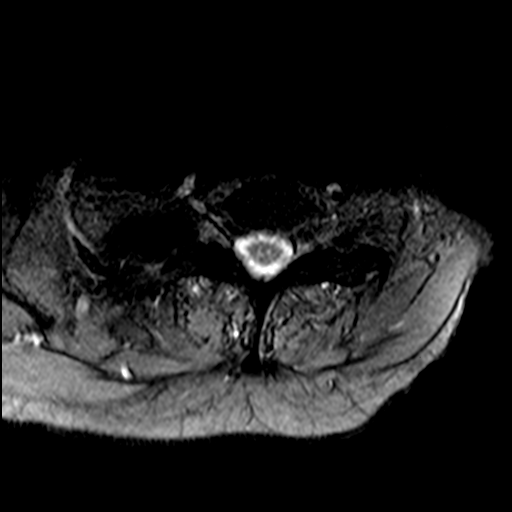
[im 7/24]
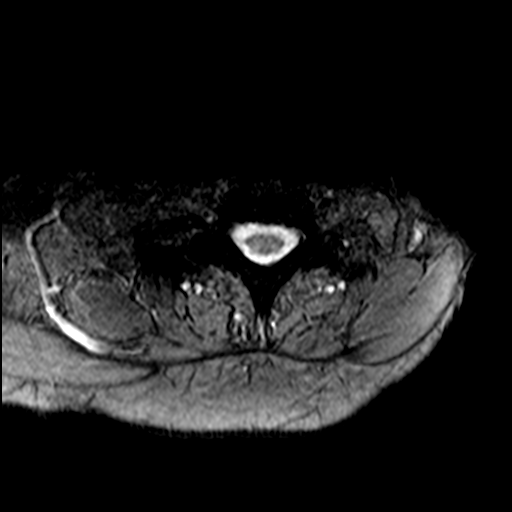

[36 of 48 positions shown; findings below may reference images not displayed]

FINDINGS: Alignment: 2 mm anterolisthesis of C7 on T1.

Vertebrae: No acute fracture, evidence of discitis, or bone lesion.

Cord: Normal signal and morphology.

Posterior Fossa, vertebral arteries, paraspinal tissues: Posterior
fossa demonstrates no focal abnormality. Vertebral artery flow voids
are maintained. Paraspinal soft tissues are unremarkable.

Disc levels:

Discs: Degenerative disease with disc height loss at C3-4, C4-5,
C5-6 and C6-7.

C2-3: No significant disc bulge. No neural foraminal stenosis. No
central canal stenosis.

C3-4: Mild broad-based disc bulge. Moderate bilateral facet
arthropathy. Moderate bilateral foraminal stenosis. No spinal
stenosis.

C4-5: Broad-based disc bulge. Bilateral uncovertebral degenerative
changes. Severe right and mild left foraminal stenosis. Mild spinal
stenosis.

C5-6: Broad-based disc bulge. Bilateral uncovertebral degenerative
changes. Moderate bilateral foraminal stenosis. Mild central canal
stenosis.

C6-7: Mild broad-based disc bulge. No foraminal or central canal
stenosis.

C7-T1: No significant disc bulge. No neural foraminal stenosis. No
central canal stenosis.
IMPRESSION: 1. Cervical spine spondylosis as described above.
2. At C4-5 there is a broad-based disc bulge. Bilateral
uncovertebral degenerative changes. Severe right and mild left
foraminal stenosis. Mild spinal stenosis.
3.  No acute osseous injury of the cervical spine.

## 2021-04-24 MED ORDER — LEVOTHYROXINE SODIUM 88 MCG PO TABS: ORAL_TABLET | ORAL | 3 refills | Status: DC

## 2021-04-24 NOTE — Telephone Encounter (Signed)
Medication was sent in with 3 refills.

## 2021-04-28 ENCOUNTER — Other Ambulatory Visit: Payer: Self-pay | Admitting: Neurology

## 2021-04-28 ENCOUNTER — Other Ambulatory Visit: Payer: Self-pay | Admitting: Family Medicine

## 2021-04-28 MED ORDER — CARBIDOPA-LEVODOPA ER 50-200 MG PO TBCR: 1.0000 | EXTENDED_RELEASE_TABLET | Freq: Three times a day (TID) | ORAL | 0 refills | Status: DC

## 2021-04-28 NOTE — Telephone Encounter (Signed)
Pt states she needs a refill of her carbidopa-levodopa (SINEMET CR) 50-200 MG tablet. She has a week's worth of medication left. Would like refill to be sent to [facility] on [address].

## 2021-04-28 NOTE — Addendum Note (Signed)
Addended by: Gildardo Griffes on: 04/28/2021 12:01 PM   Modules accepted: Orders

## 2021-04-28 NOTE — Addendum Note (Signed)
Addended by: Gildardo Griffes on: 04/28/2021 12:00 PM   Modules accepted: Orders

## 2021-04-29 ENCOUNTER — Other Ambulatory Visit: Payer: Self-pay

## 2021-04-29 ENCOUNTER — Ambulatory Visit (INDEPENDENT_AMBULATORY_CARE_PROVIDER_SITE_OTHER): Payer: Medicare HMO

## 2021-04-29 DIAGNOSIS — J309 Allergic rhinitis, unspecified: Secondary | ICD-10-CM

## 2021-05-12 ENCOUNTER — Other Ambulatory Visit: Payer: Self-pay | Admitting: Family Medicine

## 2021-05-15 ENCOUNTER — Ambulatory Visit (INDEPENDENT_AMBULATORY_CARE_PROVIDER_SITE_OTHER): Payer: Medicare HMO | Admitting: Physical Therapy

## 2021-05-15 ENCOUNTER — Other Ambulatory Visit: Payer: Self-pay

## 2021-05-15 DIAGNOSIS — M6281 Muscle weakness (generalized): Secondary | ICD-10-CM

## 2021-05-15 DIAGNOSIS — M542 Cervicalgia: Secondary | ICD-10-CM | POA: Diagnosis not present

## 2021-05-15 NOTE — Therapy (Signed)
OUTPATIENT PHYSICAL THERAPY EVALUATION   Patient Name: Julia [name] MRN: [id number] DOB:1940-11-14, 81 y.o., female Today's Date: 05/15/2021   PT End of Session - 05/18/21 1604     Visit Number 1    Number of Visits 12    Date for PT Re-Evaluation 06/26/21    Authorization Type [employer] Medicare    PT Start Time 4098    PT Stop Time 1055    PT Time Calculation (min) 40 min    Activity Tolerance Patient tolerated treatment well    Behavior During Therapy [facility] for tasks assessed/performed             Past Medical History:  Diagnosis Date   Arthritis    Breast cancer ([place])    Colon cancer ([place])    previous colon cancer   Hyperlipidemia    Nocturnal leg cramps 11/13/2019   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   RLS (restless legs syndrome) 07/14/2018   Scoliosis    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[place])    Past Surgical History:  Procedure Laterality Date   ADENOIDECTOMY     BREAST LUMPECTOMY Left    radiation only   BUNIONECTOMY     [place]     Patient Active Problem List   Diagnosis Date Noted   Multiple pulmonary nodules 06/20/2019   Statin intolerance 10/13/2018   Pelvic floor dysfunction 08/26/2018   RLS (restless legs syndrome) 07/14/2018   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   Female bladder prolapse 04/07/2018   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 02/07/2018   Seasonal and perennial allergic rhinitis 02/01/2018   Anxiety 09/29/2017   Parkinson's disease ([place]) 09/29/2017   Dysfunctional voiding of urine 09/13/2016   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   Gastroesophageal reflux disease 09/13/2016   Age-related osteoporosis without current pathological fracture 07/20/2016   Drug-induced polyneuropathy ([place]) 03/07/2015   Mixed hyperlipidemia 03/07/2015   Impaired fasting glucose 03/07/2015    Atrophic vaginitis 02/13/2015   History of colon cancer 02/13/2015   Hypothyroidism due to Hashimoto's thyroiditis 02/13/2015   Irritable bowel syndrome with both constipation and diarrhea 02/13/2015   History of breast cancer left, 2016 02/13/2015   Recurrent UTI 02/13/2015   Vitamin D deficiency disease 02/13/2015    PCP: Leamon Arnt, MD  REFERRING PROVIDER: Rennis Harding  REFERRING DIAG: Cervical Radiculopathy  THERAPY DIAG:  Cervicalgia  Muscle weakness (generalized)   ONSET DATE:   3 months ago.   SUBJECTIVE:  SUBJECTIVE STATEMENT: Pt states increased/ongoing pain in neck bilaterally. States significant pain in AM and increased pain with rotation. No radicular pain. Uncomfortable with sleep, would like to lay on back but has discomfort with increased nasal drainage.  Also has cramps in legs and pain in bil hips L>R which also effect sleep. Pt states some general mobility issues with Parkinsons, difficulty moving in bed, and stiffness in AM. Likes to be active, walks, and also does parkinsons exercise video 3-4 days/wk.   PERTINENT HISTORY: Parkinsons, Hx of cancer,   PAIN:  Are you having pain? Yes NPRS scale: 3-4/10 ,  up to 9/10 with initial waking.  Pain location: Neck/ Upper traps  Pain orientation: Right and Left  PAIN TYPE: aching Pain description: intermittent  Aggravating factors: Driving  Relieving factors:  heat   Are you having pain? Yes NPRS scale: 5/10 Pain location: Hips  Pain orientation:  Left > Right  PAIN TYPE: aching Pain description: intermittent  Aggravating factors: Sleeping/laying on hips, increased activity  Relieving factors: stretches  PRECAUTIONS: None  WEIGHT BEARING RESTRICTIONS No  FALLS:  Has patient fallen in last 6 months? Yes Number of falls: 1,  fell out of bed, sleeping at sisters house.   PLOF: Independent  PATIENT GOALS : Decreased neck pain, improved ROM for driving, improved bed mobility.    OBJECTIVE:   DIAGNOSTIC FINDINGS:  Cervical MR  IMPRESSION: 1. Cervical spine spondylosis as described above. 2. At C4-5 there is a broad-based disc bulge. Bilateral uncovertebral degenerative changes. Severe right and mild left foraminal stenosis. Mild spinal stenosis   COGNITION:  Overall cognitive status: Within functional limits for tasks assessed  POSTURE: Rounded shoulders, moderate/significant forward head    AROM/PROM:  05/15/21: Cervical ROM: Flex: WNL, Ext: severe limitation,   L Rot: 60, R Rot 60.   Shoulder ROM: mild limitation for elevation  UPPER EXTREMITY MMT:  05/15/21: shoulder: 4/5,   SPECIAL TESTS:   JOINT MOBILITY TESTING:  Mild stiffness hypomobility in bil GHJ;  Hypomobile c-spine and t-spine  PALPATION:  Tightness and tenderness in bil UT s , bil cervical paraspinals and SO.     TODAY'S TREATMENT:  05/15/21: Eval:  Ther ex: See below for HEP    PATIENT EDUCATION: Education details: PT POC, Exam findings , HEP Person educated: Patient Education method: Explanation, Demonstration, Tactile cues, Verbal cues, and Handouts Education comprehension: verbalized understanding, returned demonstration, verbal cues required, tactile cues required, and needs further education   HOME EXERCISE PROGRAM: Access Code: [email or web address] URL: [email or web address] Date: 05/18/2021 Prepared by: Lyndee Hensen  Exercises Standing Backward Shoulder Rolls - 2 x daily - 1 sets - 10 reps Standing Scapular Retraction - 2 x daily - 1 sets - 10 reps Seated Cervical Rotation AROM - 1-2 x daily - 1 sets - 10 reps   ASSESSMENT:  CLINICAL IMPRESSION: Pt presents with primary complaint of neck pain and tightness. She has limited ROM in c-spine with pain and decreased ability for rotation and driving. She  has tightness and tenderness in bil UT and cervical musculature. She has other postural deficits, with fwd head, rounded shoulders, and stiffness in postural muscles from parkinsons. Pt also requests assist with education on bed mobility, due to stiffness and increasing difficulty with this. Pt to benefit from education on HEP for optimal mobility exercises for parkinsons. Discussed parkinsons exercise class at gym- will get pt info. Pt to benefit from skilled PT to improve deficits and pain.  OBJECTIVE IMPAIRMENTS decreased activity tolerance, decreased  balance, decreased mobility, decreased ROM, decreased strength, increased muscle spasms, impaired flexibility, postural dysfunction, and pain.   ACTIVITY LIMITATIONS cleaning, community activity, driving, meal prep, laundry, yard work, and shopping.   PERSONAL FACTORS 1 comorbidity: Parkinsons  are also affecting patient's functional outcome.    REHAB POTENTIAL: Good  CLINICAL DECISION MAKING: Stable/uncomplicated  EVALUATION COMPLEXITY: Low   GOALS: Goals reviewed with patient? Yes   SHORT TERM GOALS:  STG Name Target Date 2 weeks  Goal status  1 Patient will be independent with initial HEP   05/29/21 INITIAL  2 Pt to demo improved ROM of cervical rotation by at least 5 deg bilaterally 05/29/21 INITIAL  3 Pt to report independence with bed mobility, scooting, rolling and supine to sit transfer.   06/19/21 INITIAL   LONG TERM GOALS:   LTG Name Target Date 6 weeks.  Goal status  1 Patient will be independent with final HEP for neck pain and general mobility/parkinsons   06/26/21 INITIAL  2 Pt to report decreased pain in neck to 0-2/10 with cervical rotation, UE lift, reach, carry, to improve ability for driving and IADLS.   06/26/21 INITIAL  3 Pt to demonstrate improved strength of shoulder to be at least 4+/5, to improve ability for lift, carry and IADLs.   06/26/21 INITIAL  4      5         PLAN: PT FREQUENCY: 2x/week  PT  DURATION: 6 weeks  PLANNED INTERVENTIONS: Therapeutic exercises, Therapeutic activity, Neuro Muscular re-education, Balance training, Gait training, Patient/Family education, Joint mobilization, Stair training, DME instructions, Dry Needling, Cryotherapy, Moist heat, Taping, Traction, and Manual therapy  PLAN FOR NEXT SESSION:  Ther ex for cervical and thoracic mobility, Manual for neck pain and tightness as needed. Education/training on bed mobility    Lyndee Hensen, PT 05/18/2021, 4:10 PM

## 2021-05-18 ENCOUNTER — Encounter: Payer: Self-pay | Admitting: Physical Therapy

## 2021-05-19 ENCOUNTER — Encounter: Payer: Self-pay | Admitting: Family Medicine

## 2021-05-20 ENCOUNTER — Encounter: Payer: Self-pay | Admitting: Family Medicine

## 2021-05-20 ENCOUNTER — Other Ambulatory Visit: Payer: Self-pay

## 2021-05-20 ENCOUNTER — Ambulatory Visit (INDEPENDENT_AMBULATORY_CARE_PROVIDER_SITE_OTHER): Payer: Medicare HMO

## 2021-05-20 ENCOUNTER — Ambulatory Visit: Payer: Medicare HMO | Admitting: Family Medicine

## 2021-05-20 VITALS — BP 110/72 | HR 88 | Temp 97.8°F | Ht 65.0 in | Wt 138.2 lb

## 2021-05-20 DIAGNOSIS — N3001 Acute cystitis with hematuria: Secondary | ICD-10-CM | POA: Diagnosis not present

## 2021-05-20 DIAGNOSIS — R350 Frequency of micturition: Secondary | ICD-10-CM

## 2021-05-20 DIAGNOSIS — J309 Allergic rhinitis, unspecified: Secondary | ICD-10-CM | POA: Diagnosis not present

## 2021-05-20 DIAGNOSIS — R8290 Unspecified abnormal findings in urine: Secondary | ICD-10-CM | POA: Diagnosis not present

## 2021-05-20 DIAGNOSIS — Z79899 Other long term (current) drug therapy: Secondary | ICD-10-CM | POA: Diagnosis not present

## 2021-05-20 LAB — POCT URINALYSIS DIPSTICK
Bilirubin, UA: NEGATIVE
Blood, UA: POSITIVE
Glucose, UA: NEGATIVE
Ketones, UA: NEGATIVE
Nitrite, UA: POSITIVE
Protein, UA: NEGATIVE
Spec Grav, UA: 1.025 (ref 1.010–1.025)
Urobilinogen, UA: 0.2 E.U./dL
pH, UA: 6 (ref 5.0–8.0)

## 2021-05-20 MED ORDER — SULFAMETHOXAZOLE-TRIMETHOPRIM 800-160 MG PO TABS: 1.0000 | ORAL_TABLET | Freq: Two times a day (BID) | ORAL | 0 refills | Status: DC

## 2021-05-20 NOTE — Patient Instructions (Signed)
An antibiotic has been sent to your pharmacy, start today. We will call or notify you via MyChart if the urine culture indicates a different antibiotic to be used. Drink at least 2 liters = 64oz = 8 cups of water daily.  

## 2021-05-20 NOTE — Progress Notes (Signed)
Subjective:     Patient ID: Julia [name], female    DOB: 09-Jul-1940, 81 y.o.   MRN: [id number]  Chief Complaint  Patient presents with   Urinary Frequency    Sx started last Friday   Cloudy urine    HPI Since 2/16, urinary freq, cloudy urine,tingle in hands when urinates(which is what she does),  dysuria at end, nocturia 4-5.  No new pain.  No f/c, no vomiting  Going to get RFA L lumbar and then neck-will take valium for procedure  Has a lot of questions about meds  There are no preventive care reminders to display for this patient.  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     Calcium Citrate-Vitamin D 315-250 MG-UNIT TABS Take by mouth.     carbidopa-levodopa (SINEMET CR) 50-200 MG tablet Take 1 tablet by mouth 3 (three) times daily. 270 tablet 0   cycloSPORINE (RESTASIS) 0.05 % ophthalmic emulsion SMARTSIG:In Eye(s)     denosumab (PROLIA) 60 MG/ML SOSY injection Inject 60 mg into the skin every 6 (six) months.     diphenhydrAMINE (BENADRYL) 12.5 MG chewable tablet Chew 12.5 mg by mouth every 6 (six) hours as needed.     EPINEPHrine 0.3 mg/0.3 mL IJ SOAJ injection INJECT INTO THE MIDDLE OF THE OUTER THIGH AND HOLD FOR 3 SECONDS AS NEEDED FOR SEVERE ALLERGIC  REACTION THEN CALL 911 IF USED. 2 each 1   fluticasone (FLONASE) 50 MCG/ACT nasal spray PLACE 2 SPRAYS IN EACH NOSTRIL DAILY AS NEEDED FOR ALLERGIES OR RHINITIS 48 g 3   levothyroxine (SYNTHROID) 88 MCG tablet TAKE ONE TABLET BY MOUTH DAILY FOR BREAKFAST 90 tablet 3   Melatonin 1 MG CAPS Take by mouth as needed.     Multiple Vitamins-Minerals (CENTRUM SILVER PO) Take by mouth.     naproxen sodium (ALEVE) 220 MG tablet Take 220 mg by mouth as needed.     NON FORMULARY [facility]  Creams-#11 Peripheral Neuropathy cream     pramipexole (MIRAPEX) 0.25 MG tablet TAKE 1 TABLET BY MOUTH AT BEDTIME 90 tablet 1   Probiotic Product (PROBIOTIC DAILY PO) Take by mouth.     SALINE NASAL MIST NA Place into the nose.     tiZANidine (ZANAFLEX) 4 MG tablet TAKE 1 TABLET (4 MG TOTAL) BY MOUTH 2 (TWO) TIMES DAILY AS NEEDED FOR MUSCLE SPASMS. 180 tablet 1   traMADol (ULTRAM) 50 MG tablet Take 1 tablet (50 mg total) by mouth 2 (two) times daily as needed for moderate pain. 20 tablet 1   UNABLE TO FIND Med  Name: Allergy shots 1/week     predniSONE (DELTASONE) 10 MG tablet Take 4 tabs qd x 2 days, 3 qd x 2 days, 2 qd x 2d, 1qd x 3 days 21 tablet 0   diazepam (VALIUM) 5 MG tablet SMARTSIG:1 Tablet(s) By Mouth (Patient not taking: Reported on 05/20/2021)     No facility-administered medications prior to visit.    Allergies  Allergen Reactions   Cefdinir    Cholestyramine     GERD   Contrast Media [Iodinated Contrast Media] Diarrhea   Ezetimibe    Meperidine    Nitrofurantoin Diarrhea   Statins    Teriparatide    ROS  Strange dreams/violent dreams      Objective:     BP 110/72    Pulse 88    Temp 97.8 F (36.6 C) (Temporal)    Ht 5\' 5"  (1.651 m)    Wt 138 lb 4 oz (62.7 kg)    SpO2 97%    BMI 23.01 kg/m  Wt Readings from Last 3 Encounters:  05/20/21 138 lb 4 oz (62.7 kg)  03/19/21 135 lb 12.8 oz (61.6 kg)  02/26/21 137 lb 12.8 oz (62.5 kg)        Gen: WDWN NAD HEENT: NCAT,  conjunctiva not injected, sclera nonicteric NECK:  supple, no thyromegaly, no nodes, no carotid bruits CARDIAC: RRR, S1S2+, no murmur. LUNGS: CTAB. No wheezes ABDOMEN:  BS+, soft, NTND, No HSM, no masses. No CVAT EXT:  no edema MSK: no gross abnormalities.  NEURO: A&O x3.  CN II-XII intact.  PSYCH: normal mood. Good eye contact  Results for orders placed or performed in visit on 05/20/21  POCT urinalysis dipstick  Result Value Ref Range   Color, UA YELLOW    Clarity, UA CLOUDY    Glucose, UA Negative Negative   Bilirubin, UA NEGATIVE    Ketones, UA NEGATIVE    Spec Grav, UA 1.025 1.010 - 1.025   Blood, UA POSITIVE    pH, UA 6.0 5.0 - 8.0   Protein, UA Negative Negative   Urobilinogen, UA 0.2 0.2 or 1.0 E.U./dL   Nitrite, UA POSITIVE    Leukocytes, UA Large (3+) (A) Negative   Appearance     Odor       Assessment & Plan:   Problem List Items Addressed This Visit   None Visit Diagnoses     Acute cystitis with hematuria    -  Primary   Frequent urination       Relevant Orders   POCT urinalysis dipstick (Completed)   Abnormal urine odor       Relevant Orders   POCT urinalysis dipstick (Completed)   Cloudy urine       Relevant Orders   POCT urinalysis dipstick (Completed)      UTI-bactrim(pt preference).  Check [facility]-pt has a lot of questions about meds.  Refer to pharmD  No orders of the defined types were placed in this encounter.   Wellington Hampshire, MD

## 2021-05-21 ENCOUNTER — Encounter: Payer: Self-pay | Admitting: Physical Therapy

## 2021-05-21 ENCOUNTER — Ambulatory Visit (INDEPENDENT_AMBULATORY_CARE_PROVIDER_SITE_OTHER): Payer: Medicare HMO | Admitting: Physical Therapy

## 2021-05-21 ENCOUNTER — Ambulatory Visit: Payer: Medicare HMO | Admitting: Podiatry

## 2021-05-21 ENCOUNTER — Encounter: Payer: Self-pay | Admitting: Podiatry

## 2021-05-21 DIAGNOSIS — M79675 Pain in left toe(s): Secondary | ICD-10-CM

## 2021-05-21 DIAGNOSIS — B351 Tinea unguium: Secondary | ICD-10-CM

## 2021-05-21 DIAGNOSIS — R262 Difficulty in walking, not elsewhere classified: Secondary | ICD-10-CM | POA: Diagnosis not present

## 2021-05-21 DIAGNOSIS — M79674 Pain in right toe(s): Secondary | ICD-10-CM

## 2021-05-21 DIAGNOSIS — M542 Cervicalgia: Secondary | ICD-10-CM

## 2021-05-21 DIAGNOSIS — M6281 Muscle weakness (generalized): Secondary | ICD-10-CM | POA: Diagnosis not present

## 2021-05-21 DIAGNOSIS — G629 Polyneuropathy, unspecified: Secondary | ICD-10-CM

## 2021-05-21 NOTE — Therapy (Signed)
OUTPATIENT PHYSICAL THERAPY TREATMENT NOTE   Patient Name: Julia [name] MRN: [id number] DOB:Aug 27, 1940, 81 y.o., female Today's Date: 05/21/2021  PCP: Leamon Arnt, MD REFERRING PROVIDER: Leamon Arnt, MD   PT End of Session - 05/21/21 1345     Visit Number 2    Number of Visits 12    Date for PT Re-Evaluation 06/26/21    Authorization Type A[employer]na Medicare    PT Start Time 1347    PT Stop Time 1425    PT Time Calculation (min) 38 min    Activity Tolerance Patient tolerated treatment well    Behavior During Therapy [facility] for tasks assessed/performed             Past Medical History:  Diagnosis Date   Arthritis    Breast cancer ([place])    Colon cancer ([place])    previous colon cancer   Hyperlipidemia    Nocturnal leg cramps 11/13/2019   Parkinson disease ([place])    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   RLS (restless legs syndrome) 07/14/2018   Scoliosis    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[place])    Past Surgical History:  Procedure Laterality Date   ADENOIDECTOMY     BREAST LUMPECTOMY Left    radiation only   BUNIONECTOMY     [place]     Patient Active Problem List   Diagnosis Date Noted   Multiple pulmonary nodules 06/20/2019   Statin intolerance 10/13/2018   Pelvic floor dysfunction 08/26/2018   RLS (restless legs syndrome) 07/14/2018   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   Female bladder prolapse 04/07/2018   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 02/07/2018   Seasonal and perennial allergic rhinitis 02/01/2018   Anxiety 09/29/2017   Parkinson's disease ([place]) 09/29/2017   Dysfunctional voiding of urine 09/13/2016   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[place]) 09/13/2016   Gastroesophageal reflux disease 09/13/2016   Age-related osteoporosis without current pathological fracture 07/20/2016   Drug-induced  polyneuropathy ([place]) 03/07/2015   Mixed hyperlipidemia 03/07/2015   Impaired fasting glucose 03/07/2015   Atrophic vaginitis 02/13/2015   History of colon cancer 02/13/2015   Hypothyroidism due to Hashimoto's thyroiditis 02/13/2015   Irritable bowel syndrome with both constipation and diarrhea 02/13/2015   History of breast cancer left, 2016 02/13/2015   Recurrent UTI 02/13/2015   Vitamin D deficiency disease 02/13/2015      PCP: Leamon Arnt, MD   REFERRING PROVIDER: Rennis Harding   REFERRING DIAG: Cervical Radiculopathy   THERAPY DIAG:  Cervicalgia   Muscle weakness (generalized)     ONSET DATE:   3 months ago.    SUBJECTIVE:  SUBJECTIVE STATEMENT: 05/21/2021 States that she doesn't have that much pain right now but that she is still stiff.   Eval:Pt states increased/ongoing pain in neck bilaterally. States significant pain in AM and increased pain with rotation. No radicular pain. Uncomfortable with sleep, would like to lay on back but has discomfort with increased nasal drainage.  Also has cramps in legs and pain in bil hips L>R which also effect sleep. Pt states some general mobility issues with Parkinsons, difficulty moving in bed, and stiffness in AM. Likes to be active, walks, and also does parkinsons exercise video 3-4 days/wk.    PERTINENT HISTORY: Parkinsons, Hx of cancer,    PAIN:  Are you having pain? Yes NPRS scale: 2/10 Pain location: Neck/ Upper traps  Pain orientation: Right and Left  PAIN TYPE: aching Pain description: intermittent  Aggravating factors: Driving  Relieving factors:  heat     Are you having pain? Yes NPRS scale: 5/10 Pain location: Hips  Pain orientation:  Left > Right  PAIN TYPE: aching Pain description: intermittent  Aggravating factors:  Sleeping/laying on hips, increased activity  Relieving factors: stretches   PRECAUTIONS: None   WEIGHT BEARING RESTRICTIONS No   FALLS:  Has patient fallen in last 6 months? Yes Number of falls: 1, fell out of bed, sleeping at sisters house.    PLOF: Independent   PATIENT GOALS : Decreased neck pain, improved ROM for driving, improved bed mobility.      OBJECTIVE:    DIAGNOSTIC FINDINGS:  Cervical MR  IMPRESSION: 1. Cervical spine spondylosis as described above. 2. At C4-5 there is a broad-based disc bulge. Bilateral uncovertebral degenerative changes. Severe right and mild left foraminal stenosis. Mild spinal stenosis     COGNITION:          Overall cognitive status: Within functional limits for tasks assessed   POSTURE: Rounded shoulders, moderate/significant forward head      AROM/PROM:           05/15/21: Cervical ROM: Flex: WNL, Ext: severe limitation,   L Rot: 60, R Rot 60.   Shoulder ROM: mild limitation for elevation   UPPER EXTREMITY MMT:           05/15/21: shoulder: 4/5,    SPECIAL TESTS:     JOINT MOBILITY TESTING:  Mild stiffness hypomobility in bil GHJ;  Hypomobile c-spine and t-spine   PALPATION:  Tightness and tenderness in bil UT s , bil cervical paraspinals and SO.               TODAY'S TREATMENT:  05/21/2021 Therapeutic Exercise:  Aerobic: Supine: chin tucks 2x15 5" holds, scapular protraction 3x10 5" holds, pec stretch x3 10" holds 1 minute each, cervical rotation 1 minute total  Prone:  Seated:  Standing: Neuromuscular Re-education: Manual Therapy: cervical traction - felt good, STM to anterior scalenes, SCM and UT Therapeutic Activity: bed mobilities transitioning from side lying to seated 2 minutes practice Self Care: Trigger Point Dry Needling:  Modalities:     Previous treatment: Ther ex: See below for HEP      PATIENT EDUCATION: Education details: on posture, anatomy Person educated: Patient Education method:  Explanation, Demonstration, Tactile cues, Verbal cues, and Handouts Education comprehension: verbalized understanding, returned demonstration, verbal cues required, tactile cues required, and needs further education     HOME EXERCISE PROGRAM: Access Code: [email or web address] URL: [email or web address] Date: 05/18/2021 Prepared by: Lyndee Hensen        ASSESSMENT:   CLINICAL IMPRESSION: 05/21/2021 Focused on manual  interventions which were tolerated well. Educated patient in posture and anatomy. Added new exercises to HEP. No increase in pain noted during session. Patient reported pec stretch felt good. Educated patient in self massage techniques to anterior neck muscles.  Eval:Pt presents with primary complaint of neck pain and tightness. She has limited ROM in c-spine with pain and decreased ability for rotation and driving. She has tightness and tenderness in bil UT and cervical musculature. She has other postural deficits, with fwd head, rounded shoulders, and stiffness in postural muscles from parkinsons. Pt also requests assist with education on bed mobility, due to stiffness and increasing difficulty with this. Pt to benefit from education on HEP for optimal mobility exercises for parkinsons. Discussed parkinsons exercise class at gym- will get pt info. Pt to benefit from skilled PT to improve deficits and pain.   OBJECTIVE IMPAIRMENTS decreased activity tolerance, decreased balance, decreased mobility, decreased ROM, decreased strength, increased muscle spasms, impaired flexibility, postural dysfunction, and pain.    ACTIVITY LIMITATIONS cleaning, community activity, driving, meal prep, laundry, yard work, and shopping.    PERSONAL FACTORS 1 comorbidity: Parkinsons  are also affecting patient's functional outcome.      REHAB POTENTIAL: Good   CLINICAL DECISION MAKING: Stable/uncomplicated   EVALUATION COMPLEXITY: Low     GOALS: Goals reviewed with patient? Yes      SHORT TERM GOALS:   STG Name Target Date 2 weeks  Goal status  1 Patient will be independent with initial HEP    05/29/21 INITIAL  2 Pt to demo improved ROM of cervical rotation by at least 5 deg bilaterally 05/29/21 INITIAL  3 Pt to report independence with bed mobility, scooting, rolling and supine to sit transfer.    06/19/21 INITIAL    LONG TERM GOALS:    LTG Name Target Date 6 weeks.  Goal status  1 Patient will be independent with final HEP for neck pain and general mobility/parkinsons    06/26/21 INITIAL  2 Pt to report decreased pain in neck to 0-2/10 with cervical rotation, UE lift, reach, carry, to improve ability for driving and IADLS.    06/26/21 INITIAL  3 Pt to demonstrate improved strength of shoulder to be at least 4+/5, to improve ability for lift, carry and IADLs.    06/26/21 INITIAL  4          5                PLAN: PT FREQUENCY: 2x/week   PT DURATION: 6 weeks   PLANNED INTERVENTIONS: Therapeutic exercises, Therapeutic activity, Neuro Muscular re-education, Balance training, Gait training, Patient/Family education, Joint mobilization, Stair training, DME instructions, Dry Needling, Cryotherapy, Moist heat, Taping, Traction, and Manual therapy   PLAN FOR NEXT SESSION:  Ther ex for cervical and thoracic mobility, Manual for neck pain and tightness as needed. Education/training on bed mobility      2:30 PM, 05/21/21 Jerene Pitch, DPT Physical Therapy with [facility]  [phone] office

## 2021-05-21 NOTE — Progress Notes (Signed)
Complaint:  Visit Type: Patient returns to my office for continued preventative foot care services. Complaint: Patient states" my nails have grown long and thick and become painful to walk and wear shoes" Patient has been diagnosed with Parkinsons  with no foot complications.  Patient has history of bunion surgery both big toe joints.  She also has history of nail removal second toe right foot. The patient presents for preventative foot care services.  Podiatric Exam: Vascular: dorsalis pedis and posterior tibial pulses are palpable bilateral. Capillary return is immediate. Temperature gradient is WNL. Skin turgor WNL  Sensorium: Normal Semmes Weinstein monofilament test. Normal tactile sensation bilaterally. Nail Exam: Pt has thick disfigured discolored nails with subungual debris noted bilateral entire nail hallux through fifth toenails with exception second toenail right foot. Ulcer Exam: There is no evidence of ulcer or pre-ulcerative changes or infection. Orthopedic Exam: Muscle tone and strength are WNL. No limitations in general ROM. No crepitus or effusions noted. Foot type and digits show no abnormalities. Bony prominences are unremarkable. Skin: No Porokeratosis. No infection or ulcers.  Asymptomatic callus sub 1st MPJ left foot.  Diagnosis:  Onychomycosis, , Pain in right toe, pain in left toes  Treatment & Plan Procedures and Treatment: Consent by patient was obtained for treatment procedures.   Debridement of mycotic and hypertrophic toenails, 1 through 5 bilateral and clearing of subungual debris. No ulceration, no infection noted. Patient was told to check with her medical doctor for neuropathy pain since her symptoms are worse at night Return Visit-Office Procedure: Patient instructed to return to the office for a follow up visit 3 months for continued evaluation and treatment.    Gardiner Barefoot DPM

## 2021-05-22 ENCOUNTER — Encounter: Payer: Self-pay | Admitting: Physical Therapy

## 2021-05-22 ENCOUNTER — Other Ambulatory Visit: Payer: Self-pay

## 2021-05-22 ENCOUNTER — Ambulatory Visit (INDEPENDENT_AMBULATORY_CARE_PROVIDER_SITE_OTHER): Payer: Medicare HMO | Admitting: Physical Therapy

## 2021-05-22 DIAGNOSIS — M542 Cervicalgia: Secondary | ICD-10-CM

## 2021-05-22 DIAGNOSIS — M6281 Muscle weakness (generalized): Secondary | ICD-10-CM

## 2021-05-22 DIAGNOSIS — R262 Difficulty in walking, not elsewhere classified: Secondary | ICD-10-CM

## 2021-05-22 LAB — URINE CULTURE
MICRO NUMBER:: 13037156
SPECIMEN QUALITY:: ADEQUATE

## 2021-05-22 NOTE — Therapy (Addendum)
OUTPATIENT PHYSICAL THERAPY TREATMENT NOTE   Patient Name: Julia [name] MRN: [id number] DOB:04-23-1940, 81 y.o., female Today's Date: 05/22/2021  PCP: Leamon Arnt, MD REFERRING PROVIDER: Leamon Arnt, MD   PT End of Session - 05/22/21 1548     Visit Number 3    Number of Visits 12    Date for PT Re-Evaluation 06/26/21    Authorization Type [employer] Medicare    PT Start Time 24    PT Stop Time 8937    PT Time Calculation (min) 45 min    Activity Tolerance Patient tolerated treatment well    Behavior During Therapy [facility] for tasks assessed/performed              Past Medical History:  Diagnosis Date   Arthritis    Breast cancer ([place])    Colon cancer ([place])    previous colon cancer   Hyperlipidemia    Nocturnal leg cramps 11/13/2019   Parkinson disease ([place])    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   RLS (restless legs syndrome) 07/14/2018   Scoliosis    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[place])    Past Surgical History:  Procedure Laterality Date   ADENOIDECTOMY     BREAST LUMPECTOMY Left    radiation only   BUNIONECTOMY     [place]     Patient Active Problem List   Diagnosis Date Noted   Multiple pulmonary nodules 06/20/2019   Statin intolerance 10/13/2018   Pelvic floor dysfunction 08/26/2018   RLS (restless legs syndrome) 07/14/2018   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   Female bladder prolapse 04/07/2018   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 02/07/2018   Seasonal and perennial allergic rhinitis 02/01/2018   Anxiety 09/29/2017   Parkinson's disease ([place]) 09/29/2017   Dysfunctional voiding of urine 09/13/2016   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   Gastroesophageal reflux disease 09/13/2016   Age-related osteoporosis without current pathological fracture 07/20/2016   Drug-induced  polyneuropathy ([place]) 03/07/2015   Mixed hyperlipidemia 03/07/2015   Impaired fasting glucose 03/07/2015   Atrophic vaginitis 02/13/2015   History of colon cancer 02/13/2015   Hypothyroidism due to Hashimoto's thyroiditis 02/13/2015   Irritable bowel syndrome with both constipation and diarrhea 02/13/2015   History of breast cancer left, 2016 02/13/2015   Recurrent UTI 02/13/2015   Vitamin D deficiency disease 02/13/2015      PCP: Leamon Arnt, MD   REFERRING PROVIDER: Rennis Harding   REFERRING DIAG: Cervical Radiculopathy   THERAPY DIAG:  Cervicalgia   Muscle weakness (generalized)     ONSET DATE:   3 months ago.    SUBJECTIVE:  SUBJECTIVE STATEMENT: 05/22/2021 Pt states some improvements in neck pain, but still sore, R>L.   Eval:Pt states increased/ongoing pain in neck bilaterally. States significant pain in AM and increased pain with rotation. No radicular pain. Uncomfortable with sleep, would like to lay on back but has discomfort with increased nasal drainage.  Also has cramps in legs and pain in bil hips L>R which also effect sleep. Pt states some general mobility issues with Parkinsons, difficulty moving in bed, and stiffness in AM. Likes to be active, walks, and also does parkinsons exercise video 3-4 days/wk.    PERTINENT HISTORY: Parkinsons, Hx of cancer,    PAIN:  Are you having pain? Yes NPRS scale: 2/10 Pain location: Neck/ Upper traps  Pain orientation: Right and Left  PAIN TYPE: aching Pain description: intermittent  Aggravating factors: Driving  Relieving factors:  heat     Are you having pain? Yes NPRS scale: 5/10 Pain location: Hips  Pain orientation:  Left > Right  PAIN TYPE: aching Pain description: intermittent  Aggravating factors: Sleeping/laying on hips,  increased activity  Relieving factors: stretches   PRECAUTIONS: None   WEIGHT BEARING RESTRICTIONS No   FALLS:  Has patient fallen in last 6 months? Yes Number of falls: 1, fell out of bed, sleeping at sisters house.    PLOF: Independent   PATIENT GOALS : Decreased neck pain, improved ROM for driving, improved bed mobility.      OBJECTIVE:    DIAGNOSTIC FINDINGS:  Cervical MR  IMPRESSION: 1. Cervical spine spondylosis as described above. 2. At C4-5 there is a broad-based disc bulge. Bilateral uncovertebral degenerative changes. Severe right and mild left foraminal stenosis. Mild spinal stenosis     COGNITION:          Overall cognitive status: Within functional limits for tasks assessed   POSTURE: Rounded shoulders, moderate/significant forward head      AROM/PROM:           05/15/21: Cervical ROM: Flex: WNL, Ext: severe limitation,   L Rot: 60, R Rot 60.   Shoulder ROM: mild limitation for elevation   UPPER EXTREMITY MMT:           05/15/21: shoulder: 4/5,    SPECIAL TESTS:     JOINT MOBILITY TESTING:  Mild stiffness hypomobility in bil GHJ;  Hypomobile c-spine and t-spine   PALPATION:  Tightness and tenderness in bil UT s , bil cervical paraspinals and SO.               TODAY'S TREATMENT:   05/22/21: Therapeutic Exercise:  Aerobic: Supine: chin tucks 2x15 5" holds,  pec stretch x3 10" holds 1 minute each, Shoulder ER butterfly x 10, 10 sec holds;  Bridging x 15;   Seated:  Standing: Scap retract/arms at sides x 20;   Stretches: SKTC 30 sec x 3 bil;  LTR x 20;  Neuromuscular Re-education: Manual Therapy: cervical distraction; STM/TPR to R UT, Scalenes;  Therapeutic Activity: bed mobility, with bridge, scooting in bed. Self Care: Discussed posture, head position, exercise routine/HEP, and ways to decrease stiffness .      05/21/21: Therapeutic Exercise:  Aerobic: Supine: chin tucks 2x15 5" holds, scapular protraction 3x10 5" holds, pec stretch x3  10" holds 1 minute each, cervical rotation 1 minute total  Prone:  Seated:  Standing: Neuromuscular Re-education: Manual Therapy: cervical traction - felt good, STM to anterior scalenes, SCM and UT Therapeutic Activity: bed mobilities transitioning from side lying to seated 2 minutes practice Self Care:  Trigger Point Dry Needling:  Modalities:     Previous treatment: Ther ex: See below for HEP      PATIENT EDUCATION: Education details: reviewed posture, HEP  Person educated: Patient Education method: Explanation, Demonstration, Tactile cues, Verbal cues, and Handouts Education comprehension: verbalized understanding, returned demonstration, verbal cues required, tactile cues required, and needs further education     HOME EXERCISE PROGRAM: Access Code: [email or web address] URL: [email or web address] Date: 05/18/2021 Prepared by: Lyndee Hensen        ASSESSMENT:   CLINICAL IMPRESSION: 05/22/2021 Pt with tightness in pecs and anterior musculature. Continued ther ex to improve. Continued postural education for decreasing neck pain. Pt with stiffness in c-spine, improved with increased movement and ROM during session. Most soreness in R UT region, will benefit from continued manual and ther ex for muscle tightness and postural strengthening.    OBJECTIVE IMPAIRMENTS decreased activity tolerance, decreased balance, decreased mobility, decreased ROM, decreased strength, increased muscle spasms, impaired flexibility, postural dysfunction, and pain.    ACTIVITY LIMITATIONS cleaning, community activity, driving, meal prep, laundry, yard work, and shopping.    PERSONAL FACTORS 1 comorbidity: Parkinsons  are also affecting patient's functional outcome.      REHAB POTENTIAL: Good   CLINICAL DECISION MAKING: Stable/uncomplicated   EVALUATION COMPLEXITY: Low     GOALS: Goals reviewed with patient? Yes     SHORT TERM GOALS:   STG Name Target Date 2 weeks  Goal status  1  Patient will be independent with initial HEP    05/29/21 INITIAL  2 Pt to demo improved ROM of cervical rotation by at least 5 deg bilaterally 05/29/21 INITIAL  3 Pt to report independence with bed mobility, scooting, rolling and supine to sit transfer.    06/19/21 INITIAL    LONG TERM GOALS:    LTG Name Target Date 6 weeks.  Goal status  1 Patient will be independent with final HEP for neck pain and general mobility/parkinsons    06/26/21 INITIAL  2 Pt to report decreased pain in neck to 0-2/10 with cervical rotation, UE lift, reach, carry, to improve ability for driving and IADLS.    06/26/21 INITIAL  3 Pt to demonstrate improved strength of shoulder to be at least 4+/5, to improve ability for lift, carry and IADLs.    06/26/21 INITIAL  4          5                PLAN: PT FREQUENCY: 2x/week   PT DURATION: 6 weeks   PLANNED INTERVENTIONS: Therapeutic exercises, Therapeutic activity, Neuro Muscular re-education, Balance training, Gait training, Patient/Family education, Joint mobilization, Stair training, DME instructions, Dry Needling, Cryotherapy, Moist heat, Taping, Traction, and Manual therapy   PLAN FOR NEXT SESSION:  Ther ex for cervical and thoracic mobility, Manual for neck pain and tightness as needed. Education/training on bed mobility      Lyndee Hensen, PT, DPT 3:51 PM  05/22/21

## 2021-05-23 ENCOUNTER — Encounter: Payer: Medicare HMO | Admitting: Physical Therapy

## 2021-05-26 ENCOUNTER — Ambulatory Visit (INDEPENDENT_AMBULATORY_CARE_PROVIDER_SITE_OTHER): Payer: Medicare HMO | Admitting: Physical Therapy

## 2021-05-26 ENCOUNTER — Other Ambulatory Visit: Payer: Self-pay

## 2021-05-26 ENCOUNTER — Encounter: Payer: Self-pay | Admitting: Physical Therapy

## 2021-05-26 DIAGNOSIS — R262 Difficulty in walking, not elsewhere classified: Secondary | ICD-10-CM

## 2021-05-26 DIAGNOSIS — M542 Cervicalgia: Secondary | ICD-10-CM

## 2021-05-26 DIAGNOSIS — M6281 Muscle weakness (generalized): Secondary | ICD-10-CM | POA: Diagnosis not present

## 2021-05-26 NOTE — Therapy (Signed)
OUTPATIENT PHYSICAL THERAPY TREATMENT NOTE   Patient Name: Julia [name] MRN: [id number] DOB:08-27-1940, 81 y.o., female Today's Date: 05/26/2021  PCP: Leamon Arnt, MD REFERRING PROVIDER: Leamon Arnt, MD   PT End of Session - 05/26/21 1445     Visit Number 4    Number of Visits 12    Date for PT Re-Evaluation 06/26/21    Authorization Type [employer] Medicare    PT Start Time 1346    PT Stop Time 1427    PT Time Calculation (min) 41 min    Activity Tolerance Patient tolerated treatment well    Behavior During Therapy [facility] for tasks assessed/performed               Past Medical History:  Diagnosis Date   Arthritis    Breast cancer ([place])    Colon cancer ([place])    previous colon cancer   Hyperlipidemia    Nocturnal leg cramps 11/13/2019   Parkinson disease ([place])    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   RLS (restless legs syndrome) 07/14/2018   Scoliosis    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[place])    Past Surgical History:  Procedure Laterality Date   ADENOIDECTOMY     BREAST LUMPECTOMY Left    radiation only   BUNIONECTOMY     [place]     Patient Active Problem List   Diagnosis Date Noted   Multiple pulmonary nodules 06/20/2019   Statin intolerance 10/13/2018   Pelvic floor dysfunction 08/26/2018   RLS (restless legs syndrome) 07/14/2018   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   Female bladder prolapse 04/07/2018   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 02/07/2018   Seasonal and perennial allergic rhinitis 02/01/2018   Anxiety 09/29/2017   Parkinson's disease ([address]) 09/29/2017   Dysfunctional voiding of urine 09/13/2016   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[place]) 09/13/2016   Gastroesophageal reflux disease 09/13/2016   Age-related osteoporosis without current pathological fracture 07/20/2016   Drug-induced  polyneuropathy ([place]) 03/07/2015   Mixed hyperlipidemia 03/07/2015   Impaired fasting glucose 03/07/2015   Atrophic vaginitis 02/13/2015   History of colon cancer 02/13/2015   Hypothyroidism due to Hashimoto's thyroiditis 02/13/2015   Irritable bowel syndrome with both constipation and diarrhea 02/13/2015   History of breast cancer left, 2016 02/13/2015   Recurrent UTI 02/13/2015   Vitamin D deficiency disease 02/13/2015      PCP: Leamon Arnt, MD   REFERRING PROVIDER: Rennis Harding   REFERRING DIAG: Cervical Radiculopathy   THERAPY DIAG:  Cervicalgia   Muscle weakness (generalized)     ONSET DATE:   3 months ago.    SUBJECTIVE:  SUBJECTIVE STATEMENT: 05/26/2021 Pt states some improvements in neck pain, but still sore, R>L.  Still having a hard time rolling in bed.     PERTINENT HISTORY: Parkinsons, Hx of cancer,    PAIN:  Are you having pain? Yes NPRS scale: 2/10 Pain location: Neck/ Upper traps  Pain orientation: Right and Left  PAIN TYPE: aching Pain description: intermittent  Aggravating factors: Driving  Relieving factors:  heat     Are you having pain? Yes NPRS scale: 5/10 Pain location: Hips  Pain orientation:  Left > Right  PAIN TYPE: aching Pain description: intermittent  Aggravating factors: Sleeping/laying on hips, increased activity  Relieving factors: stretches   PRECAUTIONS: None   WEIGHT BEARING RESTRICTIONS No   FALLS:  Has patient fallen in last 6 months? Yes Number of falls: 1, fell out of bed, sleeping at sisters house.    PLOF: Independent   PATIENT GOALS : Decreased neck pain, improved ROM for driving, improved bed mobility.      OBJECTIVE:    DIAGNOSTIC FINDINGS:  Cervical MR  IMPRESSION: 1. Cervical spine spondylosis as described above. 2. At  C4-5 there is a broad-based disc bulge. Bilateral uncovertebral degenerative changes. Severe right and mild left foraminal stenosis. Mild spinal stenosis     COGNITION:          Overall cognitive status: Within functional limits for tasks assessed   POSTURE: Rounded shoulders, moderate/significant forward head      AROM/PROM:           05/15/21: Cervical ROM: Flex: WNL, Ext: severe limitation,   L Rot: 60, R Rot 60.   Shoulder ROM: mild limitation for elevation   UPPER EXTREMITY MMT:           05/15/21: shoulder: 4/5,    SPECIAL TESTS:     JOINT MOBILITY TESTING:  Mild stiffness hypomobility in bil GHJ;  Hypomobile c-spine and t-spine   PALPATION:  Tightness and tenderness in bil UT s , bil cervical paraspinals and SO.               TODAY'S TREATMENT:   05/26/21: Therapeutic Exercise:  Aerobic: Supine:  pec stretch x3 10" holds 1 minute each, Shoulder ER butterfly x 10, 10 sec holds;  Bridging x 15; Heel press 5 sec x 5 bil for elongation and decompression;   Seated: education and practice for optimal posture   Standing: Scap retract/arms at sides x 20;   Stretches:  LTR x 20;  Neuromuscular Re-education: education and practice for rolling in bed s/l to supine  Manual Therapy: cervical distraction; STM/TPR to R UT, Scalenes;  Therapeutic Activity:  Self Care:      05/22/21: Therapeutic Exercise:  Aerobic: Supine: chin tucks 2x15 5" holds,  pec stretch x3 10" holds 1 minute each, Shoulder ER butterfly x 10, 10 sec holds;  Bridging x 15; Heel press 5 sec x 5 bil for elongation and decompression;   Seated:  Standing: Scap retract/arms at sides x 20;   Stretches: SKTC 30 sec x 3 bil;  LTR x 20;  Neuromuscular Re-education: Manual Therapy: cervical distraction; STM/TPR to R UT, Scalenes;  Therapeutic Activity: bed mobility, with bridge, scooting in bed. Self Care:     05/21/21: Therapeutic Exercise:  Aerobic: Supine: chin tucks 2x15 5" holds, scapular  protraction 3x10 5" holds, pec stretch x3 10" holds 1 minute each, cervical rotation 1 minute total  Prone:  Seated:  Standing: Neuromuscular Re-education: Manual Therapy: cervical traction - felt good,  STM to anterior scalenes, SCM and UT Therapeutic Activity: bed mobilities transitioning from side lying to seated 2 minutes practice Self Care: Trigger Point Dry Needling:  Modalities:        PATIENT EDUCATION: Education details: reviewed posture, HEP  Person educated: Patient Education method: Explanation, Demonstration, Tactile cues, Verbal cues, and Handouts Education comprehension: verbalized understanding, returned demonstration, verbal cues required, tactile cues required, and needs further education     HOME EXERCISE PROGRAM: Access Code: [email or web address] URL: [email or web address] Date: 05/18/2021 Prepared by: Lyndee Hensen        ASSESSMENT:   CLINICAL IMPRESSION: 05/26/2021 Pt with improved ability after education and practice for rolling in bed today. Pt with significant muscle tension in R UT today, may benefit from DN next visit. She also has much stiffness in c-spine which is likely cause of compressive pain into UT Region. Pt with increased R SB as her resting head posture, due to scoliosis, likely getting more compression on R side. Pt to benefit from continued care .   OBJECTIVE IMPAIRMENTS decreased activity tolerance, decreased balance, decreased mobility, decreased ROM, decreased strength, increased muscle spasms, impaired flexibility, postural dysfunction, and pain.    ACTIVITY LIMITATIONS cleaning, community activity, driving, meal prep, laundry, yard work, and shopping.    PERSONAL FACTORS 1 comorbidity: Parkinsons  are also affecting patient's functional outcome.      REHAB POTENTIAL: Good   CLINICAL DECISION MAKING: Stable/uncomplicated   EVALUATION COMPLEXITY: Low     GOALS: Goals reviewed with patient? Yes     SHORT TERM GOALS:    STG Name Target Date 2 weeks  Goal status  1 Patient will be independent with initial HEP    05/29/21 INITIAL  2 Pt to demo improved ROM of cervical rotation by at least 5 deg bilaterally 05/29/21 INITIAL  3 Pt to report independence with bed mobility, scooting, rolling and supine to sit transfer.    06/19/21 INITIAL    LONG TERM GOALS:    LTG Name Target Date 6 weeks.  Goal status  1 Patient will be independent with final HEP for neck pain and general mobility/parkinsons    06/26/21 INITIAL  2 Pt to report decreased pain in neck to 0-2/10 with cervical rotation, UE lift, reach, carry, to improve ability for driving and IADLS.    06/26/21 INITIAL  3 Pt to demonstrate improved strength of shoulder to be at least 4+/5, to improve ability for lift, carry and IADLs.    06/26/21 INITIAL  4          5                PLAN: PT FREQUENCY: 2x/week   PT DURATION: 6 weeks   PLANNED INTERVENTIONS: Therapeutic exercises, Therapeutic activity, Neuro Muscular re-education, Balance training, Gait training, Patient/Family education, Joint mobilization, Stair training, DME instructions, Dry Needling, Cryotherapy, Moist heat, Taping, Traction, and Manual therapy   PLAN FOR NEXT SESSION:  Ther ex for cervical and thoracic mobility, Manual for neck pain and tightness as needed. Education/training on bed mobility      Lyndee Hensen, PT, DPT 2:53 PM  05/26/21

## 2021-05-28 ENCOUNTER — Other Ambulatory Visit: Payer: Self-pay

## 2021-05-28 ENCOUNTER — Encounter: Payer: Self-pay | Admitting: Physical Therapy

## 2021-05-28 ENCOUNTER — Ambulatory Visit (INDEPENDENT_AMBULATORY_CARE_PROVIDER_SITE_OTHER): Payer: Medicare HMO | Admitting: Physical Therapy

## 2021-05-28 DIAGNOSIS — M542 Cervicalgia: Secondary | ICD-10-CM

## 2021-05-28 DIAGNOSIS — M6281 Muscle weakness (generalized): Secondary | ICD-10-CM

## 2021-05-28 DIAGNOSIS — R262 Difficulty in walking, not elsewhere classified: Secondary | ICD-10-CM

## 2021-05-28 NOTE — Therapy (Signed)
OUTPATIENT PHYSICAL THERAPY TREATMENT NOTE   Patient Name: Julia [name] MRN: [id number] DOB:[birth year], 81 y.o., female Today's Date: 05/28/2021  PCP: Leamon Arnt, MD REFERRING PROVIDER: Leamon Arnt, MD   PT End of Session - 05/28/21 1203     Visit Number 5    Number of Visits 12    Date for PT Re-Evaluation 06/26/21    Authorization Type [employer]    PT Start Time 22    PT Stop Time 3295    PT Time Calculation (min) 40 min    Activity Tolerance Patient tolerated treatment well    Behavior During Therapy [facility] for tasks assessed/performed                Past Medical History:  Diagnosis Date   Arthritis    Breast cancer ([place])    Colon cancer ([place])    previous colon cancer   Hyperlipidemia    Nocturnal leg cramps 11/13/2019   Parkinson disease ([place])    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   RLS (restless legs syndrome) 07/14/2018   Scoliosis    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[place])    Past Surgical History:  Procedure Laterality Date   ADENOIDECTOMY     BREAST LUMPECTOMY Left    radiation only   BUNIONECTOMY     [place]     Patient Active Problem List   Diagnosis Date Noted   Multiple pulmonary nodules 06/20/2019   Statin intolerance 10/13/2018   Pelvic floor dysfunction 08/26/2018   RLS (restless legs syndrome) 07/14/2018   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   Female bladder prolapse 04/07/2018   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 02/07/2018   Seasonal and perennial allergic rhinitis 02/01/2018   Anxiety 09/29/2017   Parkinson's disease ([place]) 09/29/2017   Dysfunctional voiding of urine 09/13/2016   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   Gastroesophageal reflux disease 09/13/2016   Age-related osteoporosis without current pathological fracture 07/20/2016   Drug-induced  polyneuropathy ([place]) 03/07/2015   Mixed hyperlipidemia 03/07/2015   Impaired fasting glucose 03/07/2015   Atrophic vaginitis 02/13/2015   History of colon cancer 02/13/2015   Hypothyroidism due to Hashimoto's thyroiditis 02/13/2015   Irritable bowel syndrome with both constipation and diarrhea 02/13/2015   History of breast cancer left, 2016 02/13/2015   Recurrent UTI 02/13/2015   Vitamin D deficiency disease 02/13/2015      PCP: Leamon Arnt, MD   REFERRING PROVIDER: Rennis Harding   REFERRING DIAG: Cervical Radiculopathy   THERAPY DIAG:  Cervicalgia   Muscle weakness (generalized)     ONSET DATE:   3 months ago.    SUBJECTIVE:  SUBJECTIVE STATEMENT: 05/28/2021 Pt states some improvements in neck ROM and pain, still has some soreness at end range rotation.    PERTINENT HISTORY: Parkinsons, Hx of cancer,    PAIN:  Are you having pain? Yes NPRS scale: 2/10 Pain location: Neck/ Upper traps  Pain orientation: Right and Left  PAIN TYPE: aching Pain description: intermittent  Aggravating factors: Driving  Relieving factors:  heat     Are you having pain? Yes NPRS scale: 5/10 Pain location: Hips  Pain orientation:  Left > Right  PAIN TYPE: aching Pain description: intermittent  Aggravating factors: Sleeping/laying on hips, increased activity  Relieving factors: stretches   PRECAUTIONS: None   WEIGHT BEARING RESTRICTIONS No   FALLS:  Has patient fallen in last 6 months? Yes Number of falls: 1, fell out of bed, sleeping at sisters house.    PLOF: Independent   PATIENT GOALS : Decreased neck pain, improved ROM for driving, improved bed mobility.      OBJECTIVE:    DIAGNOSTIC FINDINGS:  Cervical MR  IMPRESSION: 1. Cervical spine spondylosis as described above. 2. At C4-5 there is  a broad-based disc bulge. Bilateral uncovertebral degenerative changes. Severe right and mild left foraminal stenosis. Mild spinal stenosis     COGNITION:          Overall cognitive status: Within functional limits for tasks assessed   POSTURE: Rounded shoulders, moderate/significant forward head      AROM/PROM:           05/15/21: Cervical ROM: Flex: WNL, Ext: severe limitation,   L Rot: 60, R Rot 60.   Shoulder ROM: mild limitation for elevation   UPPER EXTREMITY MMT:           05/15/21: shoulder: 4/5,    SPECIAL TESTS:     JOINT MOBILITY TESTING:  Mild stiffness hypomobility in bil GHJ;  Hypomobile c-spine and t-spine   PALPATION:  Tightness and tenderness in bil UT s , bil cervical paraspinals and SO.               TODAY'S TREATMENT:   05/28/21: Therapeutic Exercise: Aerobic: Supine:  Pec stretch x3 30" , Bridging x 20; Heel press 5 sec x 5 bil for elongation and decompression; Shoulder flexion/90/90 x 10;  Seated: Standing: Shoulder ER YTB x 20; Rows GTB x 20; Low rows RTB x 20;  Stretches:  Neuromuscular Re-education: Manual Therapy: manual cervical distraction 10 sec x 10; Manual ROM for cervical rot and SB, manual UT stretches    05/26/21: Therapeutic Exercise:  Aerobic: Supine:  pec stretch x3 10" holds 1 minute each, Shoulder ER butterfly x 10, 10 sec holds;  Bridging x 15; Heel press 5 sec x 5 bil for elongation and decompression;   Seated: education and practice for optimal posture   Standing: Scap retract/arms at sides x 20;   Stretches:  LTR x 20;  Neuromuscular Re-education: education and practice for rolling in bed s/l to supine  Manual Therapy: cervical distraction; STM/TPR to R UT, Scalenes;  Therapeutic Activity:  Self Care:       PATIENT EDUCATION: Education details: reviewed posture, HEP  Person educated: Patient Education method: Explanation, Demonstration, Tactile cues, Verbal cues, and Handouts Education comprehension: verbalized  understanding, returned demonstration, verbal cues required, tactile cues required, and needs further education     HOME EXERCISE PROGRAM: Access Code: [email or web address] URL: [email or web address] Date: 05/18/2021 Prepared by: Lyndee Hensen        ASSESSMENT:   CLINICAL IMPRESSION: 05/28/2021 Pt  with some improvement in cervical ROM. She does have soreness, but more at end range, and decreased in frequency and intensity. Continued ther ex for postural mobility and strengthening. Pt challenged with these exercises, and will benefit from continuation of these as tolerated.    OBJECTIVE IMPAIRMENTS decreased activity tolerance, decreased balance, decreased mobility, decreased ROM, decreased strength, increased muscle spasms, impaired flexibility, postural dysfunction, and pain.    ACTIVITY LIMITATIONS cleaning, community activity, driving, meal prep, laundry, yard work, and shopping.    PERSONAL FACTORS 1 comorbidity: Parkinsons  are also affecting patient's functional outcome.      REHAB POTENTIAL: Good   CLINICAL DECISION MAKING: Stable/uncomplicated   EVALUATION COMPLEXITY: Low     GOALS: Goals reviewed with patient? Yes     SHORT TERM GOALS:   STG Name Target Date 2 weeks  Goal status  1 Patient will be independent with initial HEP    05/29/21 INITIAL  2 Pt to demo improved ROM of cervical rotation by at least 5 deg bilaterally 05/29/21 INITIAL  3 Pt to report independence with bed mobility, scooting, rolling and supine to sit transfer.    06/19/21 INITIAL    LONG TERM GOALS:    LTG Name Target Date 6 weeks.  Goal status  1 Patient will be independent with final HEP for neck pain and general mobility/parkinsons    06/26/21 INITIAL  2 Pt to report decreased pain in neck to 0-2/10 with cervical rotation, UE lift, reach, carry, to improve ability for driving and IADLS.    06/26/21 INITIAL  3 Pt to demonstrate improved strength of shoulder to be at least 4+/5, to  improve ability for lift, carry and IADLs.    06/26/21 INITIAL  4          5                PLAN: PT FREQUENCY: 2x/week   PT DURATION: 6 weeks   PLANNED INTERVENTIONS: Therapeutic exercises, Therapeutic activity, Neuro Muscular re-education, Balance training, Gait training, Patient/Family education, Joint mobilization, Stair training, DME instructions, Dry Needling, Cryotherapy, Moist heat, Taping, Traction, and Manual therapy   PLAN FOR NEXT SESSION:  Ther ex for cervical and thoracic mobility, Manual for neck pain and tightness as needed. Education/training on bed mobility      Lyndee Hensen, PT, DPT 12:49 PM  05/28/21

## 2021-05-29 ENCOUNTER — Telehealth: Payer: Self-pay | Admitting: Physical Therapy

## 2021-05-29 NOTE — Telephone Encounter (Signed)
Can you please verify how many appointments of physical therapy patients insurance will cover. The referral said 6 weeks 12 visits but I just wanted to verify.  ?

## 2021-06-02 ENCOUNTER — Encounter: Payer: Self-pay | Admitting: Physical Therapy

## 2021-06-02 ENCOUNTER — Ambulatory Visit (INDEPENDENT_AMBULATORY_CARE_PROVIDER_SITE_OTHER): Payer: Medicare HMO | Admitting: Physical Therapy

## 2021-06-02 DIAGNOSIS — M6281 Muscle weakness (generalized): Secondary | ICD-10-CM | POA: Diagnosis not present

## 2021-06-02 DIAGNOSIS — R262 Difficulty in walking, not elsewhere classified: Secondary | ICD-10-CM

## 2021-06-02 DIAGNOSIS — M542 Cervicalgia: Secondary | ICD-10-CM

## 2021-06-02 NOTE — Therapy (Signed)
OUTPATIENT PHYSICAL THERAPY TREATMENT NOTE   Patient Name: Julia [name] MRN: [id number] DOB:Mar 16, 1941, 81 y.o., female Today's Date: 06/02/2021  PCP: Leamon Arnt, MD REFERRING PROVIDER: Leamon Arnt, MD   PT End of Session - 06/02/21 1443     Visit Number 6    Number of Visits 12    Date for PT Re-Evaluation 06/26/21    Authorization Type [employer] Medicare    PT Start Time 1345    PT Stop Time 1427    PT Time Calculation (min) 42 min    Activity Tolerance Patient tolerated treatment well    Behavior During Therapy [facility] for tasks assessed/performed                 Past Medical History:  Diagnosis Date   Arthritis    Breast cancer ([place])    Colon cancer ([place])    previous colon cancer   Hyperlipidemia    Nocturnal leg cramps 11/13/2019   Parkinson disease ([place])    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   RLS (restless legs syndrome) 07/14/2018   Scoliosis    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[place])    Past Surgical History:  Procedure Laterality Date   ADENOIDECTOMY     BREAST LUMPECTOMY Left    radiation only   BUNIONECTOMY     [place]     Patient Active Problem List   Diagnosis Date Noted   Multiple pulmonary nodules 06/20/2019   Statin intolerance 10/13/2018   Pelvic floor dysfunction 08/26/2018   RLS (restless legs syndrome) 07/14/2018   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   Female bladder prolapse 04/07/2018   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 02/07/2018   Seasonal and perennial allergic rhinitis 02/01/2018   Anxiety 09/29/2017   Parkinson's disease ([place]) 09/29/2017   Dysfunctional voiding of urine 09/13/2016   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[place]) 09/13/2016   Gastroesophageal reflux disease 09/13/2016   Age-related osteoporosis without current pathological fracture 07/20/2016   Drug-induced  polyneuropathy ([place]) 03/07/2015   Mixed hyperlipidemia 03/07/2015   Impaired fasting glucose 03/07/2015   Atrophic vaginitis 02/13/2015   History of colon cancer 02/13/2015   Hypothyroidism due to Hashimoto's thyroiditis 02/13/2015   Irritable bowel syndrome with both constipation and diarrhea 02/13/2015   History of breast cancer left, 2016 02/13/2015   Recurrent UTI 02/13/2015   Vitamin D deficiency disease 02/13/2015      PCP: Leamon Arnt, MD   REFERRING PROVIDER: Rennis Harding   REFERRING DIAG: Cervical Radiculopathy   THERAPY DIAG:  Cervicalgia   Muscle weakness (generalized)     ONSET DATE:   3 months ago.    SUBJECTIVE:  SUBJECTIVE STATEMENT: 06/02/2021 Pt states some improvements in neck ROM and pain, still has some soreness at end range rotation. Feels more stiff in neck today, not too much pain.    PERTINENT HISTORY: Parkinsons, Hx of cancer,    PAIN:  Are you having pain? Yes NPRS scale: 2/10 Pain location: Neck/ Upper traps  Pain orientation: Right and Left  PAIN TYPE: aching Pain description: intermittent  Aggravating factors: Driving  Relieving factors:  heat     Are you having pain? Yes NPRS scale: 0-5/10 Pain location: Hips  Pain orientation:  Left > Right  PAIN TYPE: aching Pain description: intermittent  Aggravating factors: sitting too long, first thing in am.  Relieving factors: stretches   PRECAUTIONS: None   WEIGHT BEARING RESTRICTIONS No   FALLS:  Has patient fallen in last 6 months? Yes Number of falls: 1, fell out of bed, sleeping at sisters house.    PLOF: Independent   PATIENT GOALS : Decreased neck pain, improved ROM for driving, improved bed mobility.      OBJECTIVE:    DIAGNOSTIC FINDINGS:  Cervical MR  IMPRESSION: 1. Cervical spine  spondylosis as described above. 2. At C4-5 there is a broad-based disc bulge. Bilateral uncovertebral degenerative changes. Severe right and mild left foraminal stenosis. Mild spinal stenosis     COGNITION:          Overall cognitive status: Within functional limits for tasks assessed   POSTURE: Rounded shoulders, moderate/significant forward head      AROM/PROM:           05/15/21: Cervical ROM: Flex: WNL, Ext: severe limitation,   L Rot: 60, R Rot 60.   Shoulder ROM: mild limitation for elevation   UPPER EXTREMITY MMT:           05/15/21: shoulder: 4/5,    SPECIAL TESTS:     JOINT MOBILITY TESTING:  Mild stiffness hypomobility in bil GHJ;  Hypomobile c-spine and t-spine   PALPATION:  Tightness and tenderness in bil UT s , bil cervical paraspinals and SO.               TODAY'S TREATMENT:   06/02/21: Therapeutic Exercise: Aerobic: Supine:  Pec stretch x3 30", Bridging x 20;  Seated:  seated cat/cow with chest opening and breathing x 20;  Standing:  shoulder flexion x 10; Shoulder ER YTB x 20; Rows GTB x 20; Low rows RTB x 20;  Stretches:  Neuromuscular Re-education: Manual Therapy: manual cervical distraction 10 sec x 10; Manual ROM for cervical rot and SB, STM to L UT and levator.    05/28/21: Therapeutic Exercise: Aerobic: Supine:  Pec stretch x3 30" , Bridging x 20; Heel press 5 sec x 5 bil for elongation and decompression; Shoulder flexion/90/90 x 10;  Seated: Standing: Shoulder ER YTB x 20; Rows GTB x 20; Low rows RTB x 20;  Stretches:  Neuromuscular Re-education: Manual Therapy: manual cervical distraction 10 sec x 10; Manual ROM for cervical rot and SB, manual UT stretches    05/26/21: Therapeutic Exercise:  Aerobic: Supine:  pec stretch x3 10" holds 1 minute each, Shoulder ER butterfly x 10, 10 sec holds;  Bridging x 15; Heel press 5 sec x 5 bil for elongation and decompression;   Seated: education and practice for optimal posture   Standing: Scap  retract/arms at sides x 20;   Stretches:  LTR x 20;  Neuromuscular Re-education: education and practice for rolling in bed s/l to supine  Manual Therapy:  cervical distraction; STM/TPR to R UT, Scalenes;  Therapeutic Activity:  Self Care:       PATIENT EDUCATION: Education details: reviewed posture, HEP  Person educated: Patient Education method: Explanation, Demonstration, Tactile cues, Verbal cues, and Handouts Education comprehension: verbalized understanding, returned demonstration, verbal cues required, tactile cues required, and needs further education     HOME EXERCISE PROGRAM: Access Code: [email or web address] URL: [email or web address] Date: 05/18/2021 Prepared by: Lyndee Hensen        ASSESSMENT:   CLINICAL IMPRESSION: 06/02/2021 Continued education and practice for posture and chest opening. Pt with less pain in cervical spine with rotation after treatment today. Still has some ROM limitations, that will likely continue due to stiffness and scoliosis. Pt to benefit from continued manual as well as posture and strengthening.    OBJECTIVE IMPAIRMENTS decreased activity tolerance, decreased balance, decreased mobility, decreased ROM, decreased strength, increased muscle spasms, impaired flexibility, postural dysfunction, and pain.    ACTIVITY LIMITATIONS cleaning, community activity, driving, meal prep, laundry, yard work, and shopping.    PERSONAL FACTORS 1 comorbidity: Parkinsons  are also affecting patient's functional outcome.      REHAB POTENTIAL: Good   CLINICAL DECISION MAKING: Stable/uncomplicated   EVALUATION COMPLEXITY: Low     GOALS: Goals reviewed with patient? Yes     SHORT TERM GOALS:   STG Name Target Date 2 weeks  Goal status  1 Patient will be independent with initial HEP    05/29/21 INITIAL  2 Pt to demo improved ROM of cervical rotation by at least 5 deg bilaterally 05/29/21 INITIAL  3 Pt to report independence with bed mobility,  scooting, rolling and supine to sit transfer.    06/19/21 INITIAL    LONG TERM GOALS:    LTG Name Target Date 6 weeks.  Goal status  1 Patient will be independent with final HEP for neck pain and general mobility/parkinsons    06/26/21 INITIAL  2 Pt to report decreased pain in neck to 0-2/10 with cervical rotation, UE lift, reach, carry, to improve ability for driving and IADLS.    06/26/21 INITIAL  3 Pt to demonstrate improved strength of shoulder to be at least 4+/5, to improve ability for lift, carry and IADLs.    06/26/21 INITIAL  4          5                PLAN: PT FREQUENCY: 2x/week   PT DURATION: 6 weeks   PLANNED INTERVENTIONS: Therapeutic exercises, Therapeutic activity, Neuro Muscular re-education, Balance training, Gait training, Patient/Family education, Joint mobilization, Stair training, DME instructions, Dry Needling, Cryotherapy, Moist heat, Taping, Traction, and Manual therapy   PLAN FOR NEXT SESSION:  Ther ex for cervical and thoracic mobility, Manual for neck pain and tightness as needed. Education/training on bed mobility      Lyndee Hensen, PT, DPT 2:46 PM  06/02/21

## 2021-06-02 NOTE — Telephone Encounter (Signed)
Patient will need to reach out to her insurance will let patient know when she comes into the office.  ?

## 2021-06-04 ENCOUNTER — Encounter: Payer: Self-pay | Admitting: Physical Therapy

## 2021-06-04 ENCOUNTER — Ambulatory Visit (INDEPENDENT_AMBULATORY_CARE_PROVIDER_SITE_OTHER): Payer: Medicare HMO | Admitting: Physical Therapy

## 2021-06-04 DIAGNOSIS — M542 Cervicalgia: Secondary | ICD-10-CM | POA: Diagnosis not present

## 2021-06-04 DIAGNOSIS — M6281 Muscle weakness (generalized): Secondary | ICD-10-CM | POA: Diagnosis not present

## 2021-06-04 NOTE — Therapy (Signed)
OUTPATIENT PHYSICAL THERAPY TREATMENT NOTE   Patient Name: Julia [name] MRN: [id number] DOB:1941-01-18, 81 y.o., female Today's Date: 06/04/2021  PCP: Leamon Arnt, MD REFERRING PROVIDER: Leamon Arnt, MD   PT End of Session - 06/04/21 1405     Visit Number 7    Number of Visits 12    Date for PT Re-Evaluation 06/26/21    Authorization Type [employer] Medicare    PT Start Time 1304    PT Stop Time 1345    PT Time Calculation (min) 41 min    Activity Tolerance Patient tolerated treatment well    Behavior During Therapy [facility] for tasks assessed/performed                  Past Medical History:  Diagnosis Date   Arthritis    Breast cancer ([place])    Colon cancer ([place])    previous colon cancer   Hyperlipidemia    Nocturnal leg cramps 11/13/2019   Parkinson disease ([place])    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   RLS (restless legs syndrome) 07/14/2018   Scoliosis    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[place])    Past Surgical History:  Procedure Laterality Date   ADENOIDECTOMY     BREAST LUMPECTOMY Left    radiation only   BUNIONECTOMY     [place]     Patient Active Problem List   Diagnosis Date Noted   Multiple pulmonary nodules 06/20/2019   Statin intolerance 10/13/2018   Pelvic floor dysfunction 08/26/2018   RLS (restless legs syndrome) 07/14/2018   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   Female bladder prolapse 04/07/2018   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 02/07/2018   Seasonal and perennial allergic rhinitis 02/01/2018   Anxiety 09/29/2017   Parkinson's disease ([place]) 09/29/2017   Dysfunctional voiding of urine 09/13/2016   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   Gastroesophageal reflux disease 09/13/2016   Age-related osteoporosis without current pathological fracture 07/20/2016   Drug-induced  polyneuropathy ([place]) 03/07/2015   Mixed hyperlipidemia 03/07/2015   Impaired fasting glucose 03/07/2015   Atrophic vaginitis 02/13/2015   History of colon cancer 02/13/2015   Hypothyroidism due to Hashimoto's thyroiditis 02/13/2015   Irritable bowel syndrome with both constipation and diarrhea 02/13/2015   History of breast cancer left, 2016 02/13/2015   Recurrent UTI 02/13/2015   Vitamin D deficiency disease 02/13/2015      PCP: Leamon Arnt, MD   REFERRING PROVIDER: Rennis Harding   REFERRING DIAG: Cervical Radiculopathy   THERAPY DIAG:  Cervicalgia   Muscle weakness (generalized)     ONSET DATE:   3 months ago.    SUBJECTIVE:  SUBJECTIVE STATEMENT: 06/04/2021 Pt states some soreness in neck with full rotation. Was overall a bit more stiff and sore this am, but is feeling better now.    PERTINENT HISTORY: Parkinsons, Hx of cancer,    PAIN:  Are you having pain? Yes NPRS scale: 2/10 Pain location: Neck/ Upper traps  Pain orientation: Right and Left  PAIN TYPE: aching Pain description: intermittent  Aggravating factors: Driving  Relieving factors:  heat     Are you having pain? Yes NPRS scale: 0-5/10 Pain location: Hips  Pain orientation:  Left > Right  PAIN TYPE: aching Pain description: intermittent  Aggravating factors: sitting too long, first thing in am.  Relieving factors: stretches   PRECAUTIONS: None   WEIGHT BEARING RESTRICTIONS No   FALLS:  Has patient fallen in last 6 months? Yes Number of falls: 1, fell out of bed, sleeping at sisters house.    PLOF: Independent   PATIENT GOALS : Decreased neck pain, improved ROM for driving, improved bed mobility.      OBJECTIVE:    DIAGNOSTIC FINDINGS:  Cervical MR  IMPRESSION: 1. Cervical spine spondylosis as described  above. 2. At C4-5 there is a broad-based disc bulge. Bilateral uncovertebral degenerative changes. Severe right and mild left foraminal stenosis. Mild spinal stenosis     COGNITION:          Overall cognitive status: Within functional limits for tasks assessed   POSTURE: Rounded shoulders, moderate/significant forward head      AROM/PROM:           05/15/21: Cervical ROM: Flex: WNL, Ext: severe limitation,   L Rot: 60, R Rot 60.   Shoulder ROM: mild limitation for elevation   UPPER EXTREMITY MMT:           05/15/21: shoulder: 4/5,    SPECIAL TESTS:     JOINT MOBILITY TESTING:  Mild stiffness hypomobility in bil GHJ;  Hypomobile c-spine and t-spine   PALPATION:  Tightness and tenderness in bil UT s , bil cervical paraspinals and SO.               TODAY'S TREATMENT:  06/04/21: Therapeutic Exercise: Aerobic: Supine:   Seated:  seated cat/cow with chest opening and breathing x 20;  Standing:  shoulder flexion x 10; Shoulder ER RTB x 20;  Rows GTB x 20;  Stretches:  Neuromuscular Re-education: Manual Therapy: manual cervical distraction 10 sec x 10; Manual ROM for cervical rot and SB, TPR to R UT and levator.      06/02/21: Therapeutic Exercise: Aerobic: Supine:  Pec stretch x3 30", Bridging x 20;  Seated:  seated cat/cow with chest opening and breathing x 20;  Standing:  shoulder flexion x 10; Shoulder ER YTB x 20; Rows GTB x 20; Low rows RTB x 20;  Stretches:  Neuromuscular Re-education: Manual Therapy: manual cervical distraction 10 sec x 10; Manual ROM for cervical rot and SB, STM to L UT    05/28/21: Therapeutic Exercise: Aerobic: Supine:  Pec stretch x3 30" , Bridging x 20; Heel press 5 sec x 5 bil for elongation and decompression; Shoulder flexion/90/90 x 10;  Seated: Standing: Shoulder ER YTB x 20; Rows GTB x 20; Low rows RTB x 20;  Stretches:  Neuromuscular Re-education: Manual Therapy: manual cervical distraction 10 sec x 10; Manual ROM for cervical rot  and SB, manual UT stretches      PATIENT EDUCATION: Education details: reviewed posture, HEP  Person educated: Patient Education method: Explanation, Demonstration, Corporate treasurer  cues, Verbal cues, and Handouts Education comprehension: verbalized understanding, returned demonstration, verbal cues required, tactile cues required, and needs further education     HOME EXERCISE PROGRAM: Access Code: [email or web address] URL: [email or web address] Date: 05/18/2021 Prepared by: Lyndee Hensen        ASSESSMENT:   CLINICAL IMPRESSION: 06/04/2021 Continued education importance of posture, as well as continued movement to decrease stiffness. Neck pain improving overall, still stiff at end range with R rotation. Pt to benefit from continued manual work on neck as well as progressive strengthening and mobility.    OBJECTIVE IMPAIRMENTS decreased activity tolerance, decreased balance, decreased mobility, decreased ROM, decreased strength, increased muscle spasms, impaired flexibility, postural dysfunction, and pain.    ACTIVITY LIMITATIONS cleaning, community activity, driving, meal prep, laundry, yard work, and shopping.    PERSONAL FACTORS 1 comorbidity: Parkinsons  are also affecting patient's functional outcome.      REHAB POTENTIAL: Good   CLINICAL DECISION MAKING: Stable/uncomplicated   EVALUATION COMPLEXITY: Low     GOALS: Goals reviewed with patient? Yes     SHORT TERM GOALS:   STG Name Target Date 2 weeks  Goal status  1 Patient will be independent with initial HEP    05/29/21 INITIAL  2 Pt to demo improved ROM of cervical rotation by at least 5 deg bilaterally 05/29/21 INITIAL  3 Pt to report independence with bed mobility, scooting, rolling and supine to sit transfer.    06/19/21 INITIAL    LONG TERM GOALS:    LTG Name Target Date 6 weeks.  Goal status  1 Patient will be independent with final HEP for neck pain and general mobility/parkinsons    06/26/21 INITIAL  2 Pt  to report decreased pain in neck to 0-2/10 with cervical rotation, UE lift, reach, carry, to improve ability for driving and IADLS.    06/26/21 INITIAL  3 Pt to demonstrate improved strength of shoulder to be at least 4+/5, to improve ability for lift, carry and IADLs.    06/26/21 INITIAL  4          5                PLAN: PT FREQUENCY: 2x/week   PT DURATION: 6 weeks   PLANNED INTERVENTIONS: Therapeutic exercises, Therapeutic activity, Neuro Muscular re-education, Balance training, Gait training, Patient/Family education, Joint mobilization, Stair training, DME instructions, Dry Needling, Cryotherapy, Moist heat, Taping, Traction, and Manual therapy   PLAN FOR NEXT SESSION:  Ther ex for cervical and thoracic mobility, Manual for neck pain and tightness as needed. Education/training on bed mobility      Lyndee Hensen, PT, DPT 2:06 PM  06/04/21

## 2021-06-09 ENCOUNTER — Encounter: Payer: Self-pay | Admitting: Physical Therapy

## 2021-06-09 ENCOUNTER — Ambulatory Visit (INDEPENDENT_AMBULATORY_CARE_PROVIDER_SITE_OTHER): Payer: Medicare HMO | Admitting: Physical Therapy

## 2021-06-09 ENCOUNTER — Encounter: Payer: Medicare HMO | Admitting: Physical Therapy

## 2021-06-09 DIAGNOSIS — M542 Cervicalgia: Secondary | ICD-10-CM | POA: Diagnosis not present

## 2021-06-09 DIAGNOSIS — R262 Difficulty in walking, not elsewhere classified: Secondary | ICD-10-CM | POA: Diagnosis not present

## 2021-06-09 DIAGNOSIS — M6281 Muscle weakness (generalized): Secondary | ICD-10-CM | POA: Diagnosis not present

## 2021-06-09 NOTE — Telephone Encounter (Signed)
Informed patient to contact insurance to avoid being charged for physical therapy visits that are over her limited amount.  ?

## 2021-06-09 NOTE — Therapy (Addendum)
OUTPATIENT PHYSICAL THERAPY TREATMENT NOTE   Patient Name: Julia [name] MRN: [id number] DOB:10/23/1940, 81 y.o., female Today's Date: 06/09/2021  PCP: Leamon Arnt, MD REFERRING PROVIDER: Leamon Arnt, MD   PT End of Session - 06/09/21 1305     Visit Number 8    Number of Visits 12    Date for PT Re-Evaluation 06/26/21    Authorization Type [employer] Medicare    PT Start Time 59    PT Stop Time 1340    PT Time Calculation (min) 40 min    Activity Tolerance Patient tolerated treatment well    Behavior During Therapy [facility] for tasks assessed/performed                  Past Medical History:  Diagnosis Date   Arthritis    Breast cancer ([place])    Colon cancer ([place])    previous colon cancer   Hyperlipidemia    Nocturnal leg cramps 11/13/2019   Parkinson disease ([place])    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   RLS (restless legs syndrome) 07/14/2018   Scoliosis    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[place])    Past Surgical History:  Procedure Laterality Date   ADENOIDECTOMY     BREAST LUMPECTOMY Left    radiation only   BUNIONECTOMY     [place]     Patient Active Problem List   Diagnosis Date Noted   Multiple pulmonary nodules 06/20/2019   Statin intolerance 10/13/2018   Pelvic floor dysfunction 08/26/2018   RLS (restless legs syndrome) 07/14/2018   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   Female bladder prolapse 04/07/2018   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 02/07/2018   Seasonal and perennial allergic rhinitis 02/01/2018   Anxiety 09/29/2017   Parkinson's disease ([place]) 09/29/2017   Dysfunctional voiding of urine 09/13/2016   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[place]) 09/13/2016   Gastroesophageal reflux disease 09/13/2016   Age-related osteoporosis without current pathological fracture 07/20/2016    Drug-induced polyneuropathy ([place]) 03/07/2015   Mixed hyperlipidemia 03/07/2015   Impaired fasting glucose 03/07/2015   Atrophic vaginitis 02/13/2015   History of colon cancer 02/13/2015   Hypothyroidism due to Hashimoto's thyroiditis 02/13/2015   Irritable bowel syndrome with both constipation and diarrhea 02/13/2015   History of breast cancer left, 2016 02/13/2015   Recurrent UTI 02/13/2015   Vitamin D deficiency disease 02/13/2015      PCP: Leamon Arnt, MD   REFERRING PROVIDER: Rennis Harding   REFERRING DIAG: Cervical Radiculopathy   THERAPY DIAG:  Cervicalgia   Muscle weakness (generalized)     ONSET DATE:   3 months ago.    SUBJECTIVE:  SUBJECTIVE STATEMENT: 06/09/2021 Pt states neck soreness, most in PM and when sleeping. Has not been sleeping well. Pain overall doing better, less often, and less intense.    PERTINENT HISTORY: Parkinsons, Hx of cancer,    PAIN:  Are you having pain? Yes NPRS scale: 5/10 Pain location: Neck/ Upper traps  Pain orientation: Right and Left  PAIN TYPE: aching Pain description: intermittent  Aggravating factors: Driving , R rotation Relieving factors:  heat     Are you having pain? Yes NPRS scale: 0-5/10 Pain location: Hips  Pain orientation:  Left > Right  PAIN TYPE: aching Pain description: intermittent  Aggravating factors: sitting too long, first thing in am.  Relieving factors: stretches   PRECAUTIONS: None   WEIGHT BEARING RESTRICTIONS No   FALLS:  Has patient fallen in last 6 months? Yes Number of falls: 1, fell out of bed, sleeping at sisters house.    PLOF: Independent   PATIENT GOALS : Decreased neck pain, improved ROM for driving, improved bed mobility.      OBJECTIVE:    DIAGNOSTIC FINDINGS:  Cervical MR  IMPRESSION: 1.  Cervical spine spondylosis as described above. 2. At C4-5 there is a broad-based disc bulge. Bilateral uncovertebral degenerative changes. Severe right and mild left foraminal stenosis. Mild spinal stenosis     COGNITION:          Overall cognitive status: Within functional limits for tasks assessed   POSTURE: Rounded shoulders, moderate/significant forward head      AROM/PROM:           05/15/21: Cervical ROM: Flex: WNL, Ext: severe limitation,   L Rot: 60, R Rot 60.   Shoulder ROM: mild limitation for elevation   UPPER EXTREMITY MMT:           05/15/21: shoulder: 4/5,    SPECIAL TESTS:     JOINT MOBILITY TESTING:  Mild stiffness hypomobility in bil GHJ;  Hypomobile c-spine and t-spine   PALPATION:  Tightness and tenderness in bil UT s , bil cervical paraspinals and SO.               TODAY'S TREATMENT:   06/06/21: Therapeutic Exercise: Aerobic: Supine:   Seated:  seated cat/cow x 20; Cervical Rotation with overpressure to R x 15;  Standing:  shoulder flexion x 10; Shoulder ER RTB x 20;  Rows GTB x 20;  Stretches:  Supine pec stretch 30 sec x 3; Supine shoulder ER x 15;  Neuromuscular Re-education: Manual Therapy: manual cervical distraction 10 sec x 10; SOR; Manual ROM for cervical rot and SB,    06/04/21: Therapeutic Exercise: Aerobic: Supine:   Seated:  seated cat/cow with chest opening and breathing x 20;  Standing:  shoulder flexion x 10; Shoulder ER RTB x 20;  Rows GTB x 20;  Stretches:  Neuromuscular Re-education: Manual Therapy: manual cervical distraction 10 sec x 10; Manual ROM for cervical rot and SB, TPR to R UT and levator.     PATIENT EDUCATION: Education details: reviewed posture, HEP  Person educated: Patient Education method: Explanation, Demonstration, Tactile cues, Verbal cues, and Handouts Education comprehension: verbalized understanding, returned demonstration, verbal cues required, tactile cues required, and needs further education      HOME EXERCISE PROGRAM: Access Code: [email or web address] URL: [email or web address] Date: 05/18/2021 Prepared by: Lyndee Hensen        ASSESSMENT:   CLINICAL IMPRESSION: 06/09/2021 Pt with improving neck pain overall. She still has soreness with rotation, as well as at night. Recommended she  discuss sleeping with MD at next appt. Pt benefiting from postural education , ther ex and strengthening, will benefit from continued care.    OBJECTIVE IMPAIRMENTS decreased activity tolerance, decreased balance, decreased mobility, decreased ROM, decreased strength, increased muscle spasms, impaired flexibility, postural dysfunction, and pain.    ACTIVITY LIMITATIONS cleaning, community activity, driving, meal prep, laundry, yard work, and shopping.    PERSONAL FACTORS 1 comorbidity: Parkinsons  are also affecting patient's functional outcome.      REHAB POTENTIAL: Good   CLINICAL DECISION MAKING: Stable/uncomplicated   EVALUATION COMPLEXITY: Low     GOALS: Goals reviewed with patient? Yes     SHORT TERM GOALS:   STG Name Target Date 2 weeks  Goal status  1 Patient will be independent with initial HEP    05/29/21 INITIAL  2 Pt to demo improved ROM of cervical rotation by at least 5 deg bilaterally 05/29/21 INITIAL  3 Pt to report independence with bed mobility, scooting, rolling and supine to sit transfer.    06/19/21 INITIAL    LONG TERM GOALS:    LTG Name Target Date 6 weeks.  Goal status  1 Patient will be independent with final HEP for neck pain and general mobility/parkinsons    06/26/21 INITIAL  2 Pt to report decreased pain in neck to 0-2/10 with cervical rotation, UE lift, reach, carry, to improve ability for driving and IADLS.    06/26/21 INITIAL  3 Pt to demonstrate improved strength of shoulder to be at least 4+/5, to improve ability for lift, carry and IADLs.    06/26/21 INITIAL  4          5                PLAN: PT FREQUENCY: 2x/week   PT DURATION: 6  weeks   PLANNED INTERVENTIONS: Therapeutic exercises, Therapeutic activity, Neuro Muscular re-education, Balance training, Gait training, Patient/Family education, Joint mobilization, Stair training, DME instructions, Dry Needling, Cryotherapy, Moist heat, Taping, Traction, and Manual therapy   PLAN FOR NEXT SESSION:  Ther ex for cervical and thoracic mobility, Manual for neck pain and tightness as needed. Education/training on bed mobility      Lyndee Hensen, PT, DPT 1:42 PM  06/09/21

## 2021-06-10 ENCOUNTER — Other Ambulatory Visit: Payer: Self-pay

## 2021-06-10 ENCOUNTER — Ambulatory Visit (INDEPENDENT_AMBULATORY_CARE_PROVIDER_SITE_OTHER): Payer: Medicare HMO

## 2021-06-10 DIAGNOSIS — J309 Allergic rhinitis, unspecified: Secondary | ICD-10-CM

## 2021-06-11 ENCOUNTER — Encounter: Payer: Self-pay | Admitting: Physical Therapy

## 2021-06-11 ENCOUNTER — Ambulatory Visit (INDEPENDENT_AMBULATORY_CARE_PROVIDER_SITE_OTHER): Payer: Medicare HMO | Admitting: Physical Therapy

## 2021-06-11 DIAGNOSIS — M6281 Muscle weakness (generalized): Secondary | ICD-10-CM

## 2021-06-11 DIAGNOSIS — M542 Cervicalgia: Secondary | ICD-10-CM

## 2021-06-11 NOTE — Therapy (Signed)
?OUTPATIENT PHYSICAL THERAPY TREATMENT NOTE ? ? ?Patient Name: Julia [name] ?MRN: [id number] ?DOB:March 16, 1941, 81 y.o., female ?Today's Date: 06/11/2021 ? ?PCP: Leamon Arnt, MD ?REFERRING PROVIDER: Leamon Arnt, MD ? ? PT End of Session - 06/11/21 1352   ? ? Visit Number 9   ? Number of Visits 12   ? Date for PT Re-Evaluation 06/26/21   ? Authorization Type Aetna Medicare   ? PT Start Time 9211   ? PT Stop Time 1350   ? PT Time Calculation (min) 45 min   ? Activity Tolerance Patient tolerated treatment well   ? Behavior During Therapy [facility] for tasks assessed/performed   ? ?  ?  ? ?  ? ? ? ? ? ? ? ? ?Past Medical History:  ?Diagnosis Date  ? Arthritis   ? Breast cancer ([place])   ? Colon cancer ([place])   ? previous colon cancer  ? Hyperlipidemia   ? Nocturnal leg cramps 11/13/2019  ? Parkinson disease ([place])   ? 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 RLS (restless legs syndrome) 07/14/2018  ? Scoliosis   ? 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 ?Past Surgical History:  ?Procedure Laterality Date  ? ADENOIDECTOMY    ? BREAST LUMPECTOMY Left   ? radiation only  ? BUNIONECTOMY    ? COLON SURGERY    ? HEMORRHOID SURGERY    ? LAPAROSCOPY    ? NASAL SINUS SURGERY    ? OOPHORECTOMY    ? TONSILLECTOMY    ? TUBAL LIGATION    ? ?Patient Active Problem List  ? Diagnosis Date Noted  ? Multiple pulmonary nodules 06/20/2019  ? Statin intolerance 10/13/2018  ? Pelvic floor dysfunction 08/26/2018  ? RLS (restless legs syndrome) 07/14/2018  ? 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 Female bladder prolapse 04/07/2018  ? 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 Seasonal and perennial allergic rhinitis 02/01/2018  ? Anxiety 09/29/2017  ? Parkinson's disease ([place]) 09/29/2017  ? Dysfunctional voiding of urine 09/13/2016  ? 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 Gastroesophageal reflux disease 09/13/2016  ? Age-related osteoporosis without current pathological fracture 07/20/2016  ?  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 Mixed hyperlipidemia 03/07/2015  ? Impaired fasting glucose 03/07/2015  ? Atrophic vaginitis 02/13/2015  ? History of colon cancer 02/13/2015  ? Hypothyroidism due to Hashimoto's thyroiditis 02/13/2015  ? Irritable bowel syndrome with both constipation and diarrhea 02/13/2015  ? History of breast cancer left, 2016 02/13/2015  ? Recurrent UTI 02/13/2015  ? Vitamin D deficiency disease 02/13/2015  ? ? ?  ?PCP: Leamon Arnt, MD ?  ?REFERRING PROVIDER: Rennis Harding ?  ?REFERRING DIAG: Cervical Radiculopathy ?  ?THERAPY DIAG:  ?Cervicalgia ?  ?Muscle weakness (generalized) ?  ?  ?ONSET DATE:   3 months ago.  ?  ?SUBJECTIVE:                                                                                                                                                                                     ?  ?  SUBJECTIVE STATEMENT: ?06/11/2021 ?Pt states having a few bad nights of sleep, waking several times, and being more achy. Still having neck pain bil.  ?  ?PERTINENT HISTORY: ?Parkinsons, Hx of cancer,  ?  ?PAIN:  ?Are you having pain? Yes ?NPRS scale: 5/10 ?Pain location: Neck/ Upper traps  ?Pain orientation: Right and Left  ?PAIN TYPE: aching ?Pain description: intermittent  ?Aggravating factors: Driving , R rotation ?Relieving factors:  heat ?  ?  ?Are you having pain? Yes ?NPRS scale: 0-5/10 ?Pain location: Hips  ?Pain orientation:  Left > Right  ?PAIN TYPE: aching ?Pain description: intermittent  ?Aggravating factors: sitting too long, first thing in am.  ?Relieving factors: stretches ?  ?PRECAUTIONS: None ?  ?WEIGHT BEARING RESTRICTIONS No ?  ?FALLS:  ?Has patient fallen in last 6 months? Yes Number of falls: 1, fell out of bed, sleeping at sisters house.  ?  ?PLOF: Independent ?  ?PATIENT GOALS : Decreased neck pain, improved ROM for driving, improved bed mobility.  ?  ?  ?OBJECTIVE:  ?  ?DIAGNOSTIC FINDINGS:  ?Cervical MR  IMPRESSION: ?1. Cervical spine spondylosis as  described above. ?2. At C4-5 there is a broad-based disc bulge. Bilateral ?uncovertebral degenerative changes. Severe right and mild left ?foraminal stenosis. Mild spinal stenosis ?  ?  ?COGNITION: ?         Overall cognitive status: Within functional limits for tasks assessed ?  ?POSTURE: ?Rounded shoulders, moderate/significant forward head  ?  ?  ?AROM/PROM: ?          05/15/21: ?Cervical ROM: Flex: WNL, Ext: severe limitation,   ?L Rot: 60, R Rot 60.   ?Shoulder ROM: mild limitation for elevation ?  ?UPPER EXTREMITY MMT: ?          05/15/21: shoulder: 4/5,  ?  ?SPECIAL TESTS: ?  ?  ?JOINT MOBILITY TESTING:  ?Mild stiffness hypomobility in bil GHJ;  ?Hypomobile c-spine and t-spine ?  ?PALPATION:  ?Tightness and tenderness in bil UT s , bil cervical paraspinals and SO.  ?  ?           ?TODAY'S TREATMENT:  ? ?06/11/21: ?Therapeutic Exercise: ?Aerobic: ?Supine:   ?Seated:  seated cat/cow x 20; Cervical Rotation x10;  thoracic rotation x 10;  ?Standing:  at wall: shoulder flexion x 10; 90/90 flex x 10; Shoulder ER x10 with chest opening; with RTB x 20;  Rows GTB x 20;  ?Stretches:  Supine pec stretch 30 sec x 3;   ?Neuromuscular Re-education: ?Manual Therapy: manual cervical distraction 10 sec x 10; Manual ROM for cervical rot and SB, Cervical and high thoracic PA s, TPR to R UT and STM to cervical paraspinals.  ? ? ? ?06/06/21: ?Therapeutic Exercise: ?Aerobic: ?Supine:   ?Seated:  seated cat/cow x 20; Cervical Rotation with overpressure to R x 15;  ?Standing:  shoulder flexion x 10; Shoulder ER RTB x 20;  Rows GTB x 20;  ?Stretches:  Supine pec stretch 30 sec x 3; Supine shoulder ER x 15;  ?Neuromuscular Re-education: ?Manual Therapy: manual cervical distraction 10 sec x 10; SOR; Manual ROM for cervical rot and SB,  ? ? ?06/04/21: ?Therapeutic Exercise: ?Aerobic: ?Supine:   ?Seated:  seated cat/cow with chest opening and breathing x 20;  ?Standing:  shoulder flexion x 10; Shoulder ER RTB x 20;  Rows GTB x 20;  ?Stretches:   ?Neuromuscular Re-education: ?Manual Therapy: manual cervical distraction 10 sec x 10; Manual ROM for cervical rot and SB, TPR to  R UT and levator.  ? ?  ?PATIENT EDUCATION: ?Education details: reviewed posture, HEP  ?Person educated: Patient ?Education method: Explanation, Demonstration, Tactile cues, Verbal cues, and Handouts ?Education comprehension: verbalized understanding, returned demonstration, verbal cues required, tactile cues required, and needs further education ?  ?  ?HOME EXERCISE PROGRAM: ?Access Code: [email or web address] ?URL: [email or web address] ?Date: 05/18/2021 ?Prepared by: Lyndee Hensen ?  ? ?  ?  ?ASSESSMENT: ?  ?CLINICAL IMPRESSION: ?06/11/2021 ?Pt with mild increase in soreness in neck today from earlier in week. She has improved ROM and no pain with cervical rotation in supine, but continues to have pain and stiffness in seated position. Continued education on importance of mobility for stiffness, and for posture/chest opening. Recommended she discuss sleeping and meds with MD at next visit, for more restful sleep.  ?  ?OBJECTIVE IMPAIRMENTS decreased activity tolerance, decreased balance, decreased mobility, decreased ROM, decreased strength, increased muscle spasms, impaired flexibility, postural dysfunction, and pain.  ?  ?ACTIVITY LIMITATIONS cleaning, community activity, driving, meal prep, laundry, yard work, and shopping.  ?  ?PERSONAL FACTORS 1 comorbidity: Parkinsons  are also affecting patient's functional outcome.  ?  ?  ?REHAB POTENTIAL: Good ?  ?CLINICAL DECISION MAKING: Stable/uncomplicated ?  ?EVALUATION COMPLEXITY: Low ?  ?  ?GOALS: ?Goals reviewed with patient? Yes ?  ?  ?SHORT TERM GOALS: ?  ?STG Name Target Date ?2 weeks  Goal status  ?1 Patient will be independent with initial HEP  ?  05/29/21 INITIAL  ?2 Pt to demo improved ROM of cervical rotation by at least 5 deg bilaterally 05/29/21 INITIAL  ?3 Pt to report independence with bed mobility, scooting, rolling and  supine to sit transfer.  ?  06/19/21 INITIAL  ?  ?LONG TERM GOALS:  ?  ?LTG Name Target Date ?6 weeks.  Goal status  ?1 Patient will be independent with final HEP for neck pain and general mobility/parkinsons  ?

## 2021-06-13 ENCOUNTER — Encounter: Payer: Medicare HMO | Admitting: Physical Therapy

## 2021-06-16 ENCOUNTER — Encounter: Payer: Medicare HMO | Admitting: Physical Therapy

## 2021-06-17 ENCOUNTER — Telehealth: Payer: Self-pay | Admitting: Family Medicine

## 2021-06-17 NOTE — Chronic Care Management (AMB) (Signed)
?  Chronic Care Management  ? ?Note ? ?06/17/2021 ?Name: Julia [name] MRN: [id number] DOB: 1940/11/29 ? ?Julia [name] is a 81 y.o. year old female who is a primary care patient of Leamon Arnt, MD. I reached out to Gretta Cool by phone today in response to a referral sent by [name]'s PCP, Leamon Arnt, MD.  ? ?[name] was given information about Chronic Care Management services today including:  ?CCM service includes personalized support from designated clinical staff supervised by her physician, including individualized plan of care and coordination with other care providers ?24/7 contact phone numbers for assistance for urgent and routine care needs. ?Service will only be billed when office clinical staff spend 20 minutes or more in a month to coordinate care. ?Only one practitioner may furnish and bill the service in a calendar month. ?The patient may stop CCM services at any time (effective at the end of the month) by phone call to the office staff. ? ? ?Patient agreed to services and verbal consent obtained.  ? ?Follow up plan:PT AWARE COPAY ? ? ?Tatjana Dellinger ?[employer] Scheduler  ?

## 2021-06-18 ENCOUNTER — Encounter: Payer: Medicare HMO | Admitting: Physical Therapy

## 2021-06-19 ENCOUNTER — Ambulatory Visit (INDEPENDENT_AMBULATORY_CARE_PROVIDER_SITE_OTHER): Payer: Medicare HMO | Admitting: Physical Therapy

## 2021-06-19 DIAGNOSIS — M542 Cervicalgia: Secondary | ICD-10-CM

## 2021-06-19 DIAGNOSIS — M6281 Muscle weakness (generalized): Secondary | ICD-10-CM

## 2021-06-19 NOTE — Therapy (Signed)
?OUTPATIENT PHYSICAL THERAPY TREATMENT NOTE ? ? ?Patient Name: Julia [name] ?MRN: [id number] ?DOB:Apr 24, 1940, 81 y.o., female ?Today's Date: 06/19/2021 ? ?PCP: Leamon Arnt, MD ?REFERRING PROVIDER: Leamon Arnt, MD ? ? PT End of Session - 06/20/21 1112   ? ? Visit Number 10   ? Number of Visits 12   ? Date for PT Re-Evaluation 06/26/21   ? Authorization Type Aetna Medicare   ? PT Start Time 1217   ? PT Stop Time 1300   ? PT Time Calculation (min) 43 min   ? Activity Tolerance Patient tolerated treatment well   ? Behavior During Therapy [facility] for tasks assessed/performed   ? ?  ?  ? ?  ? ? ? ? ? ? ? ? ? ?Past Medical History:  ?Diagnosis Date  ? Arthritis   ? Breast cancer ([place])   ? Colon cancer ([place])   ? previous colon cancer  ? Hyperlipidemia   ? Nocturnal leg cramps 11/13/2019  ? Parkinson disease ([place])   ? 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 RLS (restless legs syndrome) 07/14/2018  ? Scoliosis   ? 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 ?Past Surgical History:  ?Procedure Laterality Date  ? ADENOIDECTOMY    ? BREAST LUMPECTOMY Left   ? radiation only  ? BUNIONECTOMY    ? COLON SURGERY    ? HEMORRHOID SURGERY    ? LAPAROSCOPY    ? NASAL SINUS SURGERY    ? OOPHORECTOMY    ? TONSILLECTOMY    ? TUBAL LIGATION    ? ?Patient Active Problem List  ? Diagnosis Date Noted  ? Multiple pulmonary nodules 06/20/2019  ? Statin intolerance 10/13/2018  ? Pelvic floor dysfunction 08/26/2018  ? RLS (restless legs syndrome) 07/14/2018  ? 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 Female bladder prolapse 04/07/2018  ? 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 Seasonal and perennial allergic rhinitis 02/01/2018  ? Anxiety 09/29/2017  ? Parkinson's disease ([place]) 09/29/2017  ? Dysfunctional voiding of urine 09/13/2016  ? 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 Gastroesophageal reflux disease 09/13/2016  ? Age-related osteoporosis without current pathological fracture 07/20/2016  ?  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 Mixed hyperlipidemia 03/07/2015  ? Impaired fasting glucose 03/07/2015  ? Atrophic vaginitis 02/13/2015  ? History of colon cancer 02/13/2015  ? Hypothyroidism due to Hashimoto's thyroiditis 02/13/2015  ? Irritable bowel syndrome with both constipation and diarrhea 02/13/2015  ? History of breast cancer left, 2016 02/13/2015  ? Recurrent UTI 02/13/2015  ? Vitamin D deficiency disease 02/13/2015  ? ? ?  ?PCP: Leamon Arnt, MD ?  ?REFERRING PROVIDER: Rennis Harding ?  ?REFERRING DIAG: Cervical Radiculopathy ?  ?THERAPY DIAG:  ?Cervicalgia ?  ?Muscle weakness (generalized) ?  ?  ?ONSET DATE:   3 months ago.  ?  ?SUBJECTIVE:                                                                                                                                                                                     ?  ?  SUBJECTIVE STATEMENT: ?06/19/2021 ?Pt states she is having pain and stiffness in mornings. Better with getting up and movement. Some mornings are worse than others. Neck still sore, more in evenings. Pt did have RFA on L lumbar on Monday, has not notices significant relief yet.  ? ?PERTINENT HISTORY: ?Parkinsons, Hx of cancer,  ?  ?PAIN:  ?Are you having pain? Yes ?NPRS scale: 5/10 ?Pain location: Neck/ Upper traps  ?Pain orientation: Right and Left  ?PAIN TYPE: aching ?Pain description: intermittent  ?Aggravating factors: Driving , R rotation ?Relieving factors:  heat ?  ?  ?Are you having pain? Yes ?NPRS scale: 0-5/10 ?Pain location: Hips  ?Pain orientation:  Left > Right  ?PAIN TYPE: aching ?Pain description: intermittent  ?Aggravating factors: sitting too long, first thing in am.  ?Relieving factors: stretches ?  ?PRECAUTIONS: None ?  ?WEIGHT BEARING RESTRICTIONS No ?  ?FALLS:  ?Has patient fallen in last 6 months? Yes Number of falls: 1, fell out of bed, sleeping at sisters house.  ?  ?PLOF: Independent ?  ?PATIENT GOALS : Decreased neck pain, improved ROM for driving,  improved bed mobility.  ?  ?  ?OBJECTIVE:  ?  ?DIAGNOSTIC FINDINGS:  ?Cervical MR  IMPRESSION: ?1. Cervical spine spondylosis as described above. ?2. At C4-5 there is a broad-based disc bulge. Bilateral ?uncovertebral degenerative changes. Severe right and mild left ?foraminal stenosis. Mild spinal stenosis ?  ?  ?COGNITION: ?Overall cognitive status: Within functional limits for tasks assessed ?  ?POSTURE: ? moderate/significant forward head  ?  ?  ?AROM/PROM: ?         06/19/21: ?Cervical ROM: Flex: WNL, Ext: significant limitation,   ?L Rot: 60, R Rot 60.   ?Shoulder ROM: very mild limitation for elevation ?  ?UPPER EXTREMITY MMT: ?         06/19/21: shoulder: 4+/5,  ?  ?SPECIAL TESTS: ?  ?  ?JOINT MOBILITY TESTING:  ?Mild stiffness hypomobility in bil GHJ;  ?Hypomobile c-spine and t-spine ?  ?PALPATION:  ? ? ?           ?TODAY'S TREATMENT:  ? ?06/19/21: ?Therapeutic Exercise: ?Aerobic: ?Supine:   ?Seated:  seated cat/cow x 20; Cervical Rotation x10;  with thoracic rotation x 10;  ?Standing: at wall: shoulder flexion x 10; Shoulder ER x10 with chest opening; with RTB x 20;  Rows GTB x 20;  ?Stretches:  Supine pec stretch 30 sec x 3;  Doorway 30 sec x 3 at 60-90 deg;  ?Neuromuscular Re-education: ?Manual Therapy: manual cervical distraction 10 sec x 10; Manual ROM for cervical rot and SB, Cervical and high thoracic PA s, TPR to R UT and STM to cervical paraspinals.  ? ? ?06/11/21: ?Therapeutic Exercise: ?Aerobic: ?Supine:   ?Seated:  seated cat/cow x 20; Cervical Rotation x10;  thoracic rotation x 10;  ?Standing:  at wall: shoulder flexion x 10; 90/90 flex x 10; Shoulder ER x10 with chest opening; with RTB x 20;  Rows GTB x 20;  ?Stretches:  Supine pec stretch 30 sec x 3;   ?Neuromuscular Re-education: ?Manual Therapy: manual cervical distraction 10 sec x 10; Manual ROM for cervical rot and SB, Cervical and high thoracic PA s, TPR to R UT and STM to cervical paraspinals.  ? ?  ?PATIENT EDUCATION: ?Education details:  reviewed final HEP  ?Person educated: Patient ?Education method: Explanation, Demonstration, Tactile cues, Verbal cues, and Handouts ?Education comprehension: verbalized understanding, returned demonstration, verbal cues required, tactile cues required, and needs further education ?  ?  ?  HOME EXERCISE PROGRAM: ?Access Code: [email or web address] ?URL: [email or web address] ?Date: 05/18/2021 ?Prepared by: Lyndee Hensen ?  ? ?  ?  ?ASSESSMENT: ?  ?CLINICAL IMPRESSION: ?06/19/2021 ?Pt has been seen for 10 visits. She is overall having less pain in neck since Evaluation. She does still have pain at times, mostly in morning and evenings. She is having overall stiffness in AM that is bothersome, and likely due to parkinsons. She is doing well managing stiffness, being active, and doing HEP. Pts stiffness in neck likely due to degeneration, scoliosis and neck posture. She is ready for d/c to HEP, and will continue management with HEP. She has f/u MD appt in future for possible neck injection or ablasion if pain does not subside. Pt in agreement with plan at this time.  ?  ?OBJECTIVE IMPAIRMENTS decreased activity tolerance, decreased balance, decreased mobility, decreased ROM, decreased strength, increased muscle spasms, impaired flexibility, postural dysfunction, and pain.  ?  ?ACTIVITY LIMITATIONS cleaning, community activity, driving, meal prep, laundry, yard work, and shopping.  ?  ?PERSONAL FACTORS 1 comorbidity: Parkinsons  are also affecting patient's functional outcome.  ?  ?  ?REHAB POTENTIAL: Good ?  ?CLINICAL DECISION MAKING: Stable/uncomplicated ?  ?EVALUATION COMPLEXITY: Low ?  ?  ?GOALS: ?Goals reviewed with patient? Yes ?  ?  ?SHORT TERM GOALS: ?  ?STG Name Target Date ?2 weeks  Goal status  ?1 Patient will be independent with initial HEP  ?  05/29/21 MET  ?2 Pt to demo improved ROM of cervical rotation by at least 5 deg bilaterally 05/29/21 MET  ?3 Pt to report independence with bed mobility, scooting, rolling  and supine to sit transfer.  ?  06/19/21 MET  ?  ?LONG TERM GOALS:  ?  ?LTG Name Target Date ?6 weeks.  Goal status  ?1 Patient will be independent with final HEP for neck pain and general mobility/parkinson

## 2021-06-20 ENCOUNTER — Encounter: Payer: Medicare HMO | Admitting: Physical Therapy

## 2021-06-20 ENCOUNTER — Encounter: Payer: Self-pay | Admitting: Physical Therapy

## 2021-06-23 ENCOUNTER — Ambulatory Visit: Payer: Medicare HMO | Admitting: Neurology

## 2021-06-23 ENCOUNTER — Encounter: Payer: Self-pay | Admitting: Neurology

## 2021-06-23 VITALS — BP 105/73 | HR 91 | Ht 64.0 in | Wt 139.0 lb

## 2021-06-23 DIAGNOSIS — M542 Cervicalgia: Secondary | ICD-10-CM

## 2021-06-23 DIAGNOSIS — R6889 Other general symptoms and signs: Secondary | ICD-10-CM

## 2021-06-23 DIAGNOSIS — G20 Parkinson's disease: Secondary | ICD-10-CM | POA: Diagnosis not present

## 2021-06-23 DIAGNOSIS — M545 Low back pain, unspecified: Secondary | ICD-10-CM

## 2021-06-23 DIAGNOSIS — G8929 Other chronic pain: Secondary | ICD-10-CM

## 2021-06-23 DIAGNOSIS — F419 Anxiety disorder, unspecified: Secondary | ICD-10-CM | POA: Diagnosis not present

## 2021-06-23 DIAGNOSIS — K5909 Other constipation: Secondary | ICD-10-CM | POA: Diagnosis not present

## 2021-06-23 MED ORDER — CARBIDOPA-LEVODOPA ER 50-200 MG PO TBCR: 1.0000 | EXTENDED_RELEASE_TABLET | Freq: Three times a day (TID) | ORAL | 3 refills | Status: DC

## 2021-06-23 MED ORDER — PRAMIPEXOLE DIHYDROCHLORIDE 0.25 MG PO TABS
0.2500 mg | ORAL_TABLET | Freq: Every day | ORAL | 3 refills | Status: DC
Start: 2021-06-23 — End: 2021-07-28

## 2021-06-23 NOTE — Patient Instructions (Signed)
It was nice to meet you today. ?Continue current prescriptions acting levodopa 50-200 mg strength 1 pill 3 times daily as well as low-dose pramipexole 0.25 mg at bedtime.  We can consider increasing your pramipexole in the near future.  For anxiety and depressive symptoms, I suggest we consider starting you on a low-dose Lexapro generic, 5 mg once daily.  You can think about it for now.  Please follow-up with you to see one of our nurse practitioners in 3 to 4 months.  I have renewed your prescription for pramipexole and carbidopa-levodopa. ?

## 2021-06-23 NOTE — Progress Notes (Signed)
Subjective:  ?  ?Patient ID: Julia [name] is a 81 y.o. female. ? ?HPI ? ? ? ?Interim history:  ? ?[name] is a 81 year old right-handed woman with an underlying complex medical history of breast cancer, colon cancer, hyperlipidemia, restless leg syndrome, scoliosis, arthritis, and Parkinson's disease, who presents for follow-up consultation of her parkinsonism.  The patient is unaccompanied today and presents for follow-up.  She was previously followed by Dr. Margette [name] and was last seen by Dr. Jannifer [name] on 11/15/2020.  I reviewed the note and copied the note below for reference. Her Sinemet CR 25-100 mg strength 1 pill 4 times daily was increased to 50-200 mg strength 1 pill 3 times daily at the time.  She was maintained on pramipexole 0.25 mg strength at bedtime for restless leg syndrome. ? ?Today, 06/23/2021: She reports feeling fairly stable from the Parkinson's standpoint, it helped to increase the levodopa dose.  She is taking 50-200 mg strength ER 1 pill 3 times daily, at 7 AM, 11 AM and 5 PM daily.  She takes pramipexole at bedtime and it helps her tremor throughout the night, she does not notice much in the way of restless leg symptoms but sometimes her trembling is disturbing at night.  She has vivid dreams at times, exacerbated by certain medications such as the Zanaflex.  She says [facility]oped[facility] through [facility].  She has recently had a radiofrequency ablation in the lower back on the left side about a week ago and is pending the procedure for the right side next week.  She tries to hydrate well, no recent falls, cognitively feels stable but has had intermittent issues with anxiety and depressive symptoms, nothing sustained and would like to avoid any medication at this time.  For occasional constipation she takes FiberCon or milk of magnesia as needed.  She tries to eat natural fiber including fresh fruit and vegetables.  She finished physical therapy last week.  She fell  about a year ago.  She is up-to-date with her eye appointment and goes once a year.  She generally goes to bed between 10:30 PM and 11 PM.  She lives with her husband.  She has limited her driving.  Symptoms date back to about 10 years ago in 2013 when she started having a tremor in the left hand. ? ?The patient's allergies, current medications, family history, past medical history, past social history, past surgical history and problem list were reviewed and updated as appropriate.  ? ?Previously: ? ?11/15/2020 (Dr. Jannifer [name]): <<[name] is a 81 year old right-handed white female with a history of Parkinson's disease and low back pain.  The patient has a lot of arthritic issues, she wakes up in the morning feeling quite stiff but feels much better when she gets up and moves around.  She has restless leg syndrome at night, but she has gained good improvement with Mirapex.  She is on Sinemet CR taking the 25/100 mg tablets, 1 tablet 4 times daily.  She takes her medications at 7 AM, 11 AM, 3 PM, and 7 PM.  She is still noting some wearing off effect about a half an hour prior to the next dose of her medication.  In the middle the night, she will wake up, and her arm tremors will keep her from getting back to sleep.  She has chronic issues with constipation, she takes milk of magnesia, she has been on FiberCon but still has some issues.  In the past, she  could not tolerate magnesium supplementation secondary to diarrhea.  The patient is remaining active, she will walk at least a mile a day.  She has noted some emotional instability that she attributes to her medication.  She does have some gait instability, she denies any falls but will stumble on occasion.  She denies any issues of significance with swallowing.  She has had some sciatica type pain down the left leg, she is followed through orthopedic surgery and may end up getting an epidural steroid injection in the near future. ? >> ? ? ? ?Her Past Medical History  Is Significant For: ?Past Medical History:  ?Diagnosis Date  ? Arthritis   ? Breast cancer ([place])   ? Colon cancer ([place])   ? previous colon cancer  ? Hyperlipidemia   ? Nocturnal leg cramps 11/13/2019  ? Parkinson disease ([place])   ? 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 RLS (restless legs syndrome) 07/14/2018  ? Scoliosis   ? 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 ? ?Her Past Surgical History Is Significant For: ?Past Surgical History:  ?Procedure Laterality Date  ? ADENOIDECTOMY    ? BREAST LUMPECTOMY Left   ? radiation only  ? BUNIONECTOMY    ? COLON SURGERY    ? HEMORRHOID SURGERY    ? LAPAROSCOPY    ? NASAL SINUS SURGERY    ? OOPHORECTOMY    ? TONSILLECTOMY    ? TUBAL LIGATION    ? ? ?Her Family History Is Significant For: ?Family History  ?Problem Relation Age of Onset  ? Arthritis Mother   ? Diabetes Mother   ? Heart disease Mother   ? Hypertension Mother   ? Arthritis Father   ? Cancer Father   ? Depression Father   ? Osteoporosis Father   ? Arthritis Sister   ? Cancer Sister   ? Diabetes Sister   ? Cancer Sister   ? Cancer Sister   ? Hyperlipidemia Sister   ? Cancer Brother   ? Hyperlipidemia Brother   ? Parkinson's disease Neg Hx   ? ? ?Her Social History Is Significant For: ?Social History  ? ?Socioeconomic History  ? Marital status: Married  ?  Spouse name: Not on file  ? Number of children: Not on file  ? Years of education: 3  ? Highest education level: Master's degree (e.g., MA, MS, MEng, MEd, MSW, MBA)  ?Occupational History  ? Occupation: Retired Pharmacist, hospital   ?Tobacco Use  ? Smoking status: Never  ? Smokeless tobacco: Never  ?Vaping Use  ? Vaping Use: Never used  ?Substance and Sexual Activity  ? Alcohol use: Yes  ?  Comment: occ  ? Drug use: Never  ? Sexual activity: Not on file  ?Other Topics Concern  ? Not on file  ?Social History Narrative  ? Right handed   ? Lives with husband   ? Caffeine 1 cup daily   ? ?Social Determinants of Health  ? ?Financial Resource Strain: Low Risk    ? Difficulty of Paying Living Expenses: Not hard at all  ?Food Insecurity: No Food Insecurity  ? Worried About Charity fundraiser in the Last Year: Never true  ? Ran Out of Food in the Last Year: Never true  ?Transportation Needs: No Transportation Needs  ? Lack of Transportation (Medical): No  ? Lack of Transportation (Non-Medical): No  ?Physical Activity: Sufficiently Active  ? Days of Exercise per Week: 7 days  ? Minutes of Exercise  per Session: 40 min  ?Stress: No Stress Concern Present  ? Feeling of Stress : Not at all  ?Social Connections: Socially Integrated  ? Frequency of Communication with Friends and Family: More than three times a week  ? Frequency of Social Gatherings with Friends and Family: More than three times a week  ? Attends Religious Services: More than 4 times per year  ? Active Member of Clubs or Organizations: Yes  ? Attends Archivist Meetings: 1 to 4 times per year  ? Marital Status: Married  ? ? ?Her Allergies Are:  ?Allergies  ?Allergen Reactions  ? Cefdinir   ? Cholestyramine   ?  GERD  ? Contrast Media [Iodinated Contrast Media] Diarrhea  ? Ezetimibe   ? Meperidine   ? Nitrofurantoin Diarrhea  ? Statins   ? Teriparatide   ?:  ? ?Her Current Medications Are:  ?Outpatient Encounter Medications as of 06/23/2021  ?Medication Sig  ? Acetaminophen (TYLENOL PO) Take by mouth as needed.  ? Calcium Citrate-Vitamin D 315-250 MG-UNIT TABS Take by mouth.  ? carbidopa-levodopa (SINEMET CR) 50-200 MG tablet Take 1 tablet by mouth 3 (three) times daily.  ? denosumab (PROLIA) 60 MG/ML SOSY injection Inject 60 mg into the skin every 6 (six) months.  ? diphenhydrAMINE (BENADRYL) 12.5 MG chewable tablet Chew 12.5 mg by mouth every 6 (six) hours as needed.  ? EPINEPHrine 0.3 mg/0.3 mL IJ SOAJ injection INJECT INTO THE MIDDLE OF THE OUTER THIGH AND HOLD FOR 3 SECONDS AS NEEDED FOR SEVERE ALLERGIC REACTION THEN CALL 911 IF USED.  ? fluticasone (FLONASE) 50 MCG/ACT nasal spray PLACE 2 SPRAYS IN  EACH NOSTRIL DAILY AS NEEDED FOR ALLERGIES OR RHINITIS  ? levothyroxine (SYNTHROID) 88 MCG tablet TAKE ONE TABLET BY MOUTH DAILY FOR BREAKFAST  ? Melatonin 1 MG CAPS Take by mouth as needed.  ? Multiple Vita

## 2021-06-25 ENCOUNTER — Encounter: Payer: Medicare HMO | Admitting: Physical Therapy

## 2021-06-27 ENCOUNTER — Encounter: Payer: Medicare HMO | Admitting: Physical Therapy

## 2021-07-02 ENCOUNTER — Encounter: Payer: Medicare HMO | Admitting: Physical Therapy

## 2021-07-03 ENCOUNTER — Ambulatory Visit (INDEPENDENT_AMBULATORY_CARE_PROVIDER_SITE_OTHER): Payer: Medicare HMO

## 2021-07-03 DIAGNOSIS — J309 Allergic rhinitis, unspecified: Secondary | ICD-10-CM | POA: Diagnosis not present

## 2021-07-04 ENCOUNTER — Encounter: Payer: Medicare HMO | Admitting: Physical Therapy

## 2021-07-04 NOTE — Progress Notes (Deleted)
Chronic Care Management Pharmacy Note  07/04/2021 Name:  Julia [name] MRN:  [id number] DOB:  20-Jan-1941  Summary: ***  Recommendations/Changes made from today's visit: ***  Plan: ***   Subjective: Julia [name] is an 81 y.o. year old female who is a primary patient of Willow Ora, MD.  The CCM team was consulted for assistance with disease management and care coordination needs.    {CCMTELEPHONEFACETOFACE:21091510} for initial visit in response to provider referral for pharmacy case management and/or care coordination services.   Consent to Services:  The patient was given the following information about Chronic Care Management services today, agreed to services, and gave verbal consent: 1. CCM service includes personalized support from designated clinical staff supervised by the primary care provider, including individualized plan of care and coordination with other care providers 2. 24/7 contact phone numbers for assistance for urgent and routine care needs. 3. Service will only be billed when office clinical staff spend 20 minutes or more in a month to coordinate care. 4. Only one practitioner may furnish and bill the service in a calendar month. 5.The patient may stop CCM services at any time (effective at the end of the month) by phone call to the office staff. 6. The patient will be responsible for cost sharing (co-pay) of up to 20% of the service fee (after annual deductible is met). Patient agreed to services and consent obtained.  Patient Care Team: Willow Ora, MD as PCP - General (Family Medicine) York Spaniel, MD (Inactive) as Consulting Physician (Neurology) Bobbitt, Heywood Iles, MD as Consulting Physician (Allergy and Immunology) Mansouraty, Netty Starring., MD as Consulting Physician (Gastroenterology) Malachy Mood, MD as Consulting Physician (Oncology) Sedalia Muta, PT as Physical Therapist (Physical Therapy) Vivi Barrack, DPM as Consulting Physician  (Podiatry) Huston Foley, MD as Attending Physician (Neurology) Erroll Luna, [facility] as Pharmacist (Pharmacist)  Recent office visits: ***  Recent consult visits: [facility] visits: {Hospital DC Yes/No:25215}   Objective:  Lab Results  Component Value Date   CREATININE 1.24 (H) 02/26/2021   BUN 25 (H) 02/26/2021   GFR 59.67 (L) 02/05/2021   GFRNONAA 45 (L) 02/26/2021   GFRAA 58 (L) 07/12/2019   NA 135 02/26/2021   K 4.0 02/26/2021   CALCIUM 9.1 02/26/2021   CO2 28 02/26/2021   GLUCOSE 112 (H) 02/26/2021    Lab Results  Component Value Date/Time   HGBA1C 6.4 02/05/2021 11:40 AM   HGBA1C 5.7 (A) 05/01/2020 10:07 AM   HGBA1C 6.3 10/17/2019 10:10 AM   HGBA1C 6.3 12/01/2017 12:00 AM   GFR 59.67 (L) 02/05/2021 11:40 AM   GFR 57.02 (L) 10/17/2019 10:10 AM    Last diabetic Eye exam: No results found for: HMDIABEYEEXA  Last diabetic Foot exam: No results found for: HMDIABFOOTEX   Lab Results  Component Value Date   CHOL 210 (H) 02/05/2021   HDL 81.40 02/05/2021   LDLCALC 116 (H) 02/05/2021   TRIG 60.0 02/05/2021   CHOLHDL 3 02/05/2021       Latest Ref Rng & Units 02/26/2021   12:29 PM 02/05/2021   11:40 AM 02/26/2020   12:36 PM  Hepatic Function  Total Protein 6.5 - 8.1 g/dL 7.2   6.5   6.7    Albumin 3.5 - 5.0 g/dL 4.3   4.1   3.8    AST 15 - 41 U/L 19   18   17     ALT 0 - 44 U/L <5  3   9    Alk Phosphatase 38 - 126 U/L 50   41   37    Total Bilirubin 0.3 - 1.2 mg/dL 0.4   0.5   0.4      Lab Results  Component Value Date/Time   TSH 1.66 02/05/2021 11:40 AM   TSH 1.12 10/17/2019 10:10 AM       Latest Ref Rng & Units 02/26/2021   12:29 PM 02/05/2021   11:40 AM 02/26/2020   12:36 PM  CBC  WBC 4.0 - 10.5 K/uL 7.6   5.2   7.0    Hemoglobin 12.0 - 15.0 g/dL 13.0   86.5   78.4    Hematocrit 36.0 - 46.0 % 40.0   38.9   39.7    Platelets 150 - 400 K/uL 255   229.0   212      Lab Results  Component Value Date/Time   VD25OH 76.43 10/17/2019 10:10  AM   VD25OH 72.81 04/12/2018 01:48 PM    Clinical ASCVD: {YES/NO:21197} The 10-year ASCVD risk score (Arnett DK, et al., 2019) is: 16.2%   Values used to calculate the score:     Age: 33 years     Sex: Female     Is Non-Hispanic African American: No     Diabetic: No     Tobacco smoker: No     Systolic Blood Pressure: 105 mmHg     Is BP treated: No     HDL Cholesterol: 81.4 mg/dL     Total Cholesterol: 210 mg/dL       69/62/9528    4:13 AM 02/05/2021   10:59 AM 02/05/2020    9:47 AM  Depression screen PHQ 2/9  Decreased Interest 0 0 0  Down, Depressed, Hopeless 0 0 0  PHQ - 2 Score 0 0 0     ***Other: (CHADS2VASc if Afib, MMRC or CAT for COPD, ACT, DEXA)  Social History   Tobacco Use  Smoking Status Never  Smokeless Tobacco Never   BP Readings from Last 3 Encounters:  06/23/21 105/73  05/20/21 110/72  03/19/21 128/78   Pulse Readings from Last 3 Encounters:  06/23/21 91  05/20/21 88  03/19/21 91   Wt Readings from Last 3 Encounters:  06/23/21 139 lb (63 kg)  05/20/21 138 lb 4 oz (62.7 kg)  03/19/21 135 lb 12.8 oz (61.6 kg)   BMI Readings from Last 3 Encounters:  06/23/21 23.86 kg/m  05/20/21 23.01 kg/m  03/19/21 22.60 kg/m    Assessment/Interventions: Review of patient past medical history, allergies, medications, health status, including review of consultants reports, laboratory and other test data, was performed as part of comprehensive evaluation and provision of chronic care management services.   SDOH:  (Social Determinants of Health) assessments and interventions performed: {yes/no:20286}  SDOH Screenings   Alcohol Screen: Not on file  Depression (PHQ2-9): Low Risk    PHQ-2 Score: 0  Financial Resource Strain: Low Risk    Difficulty of Paying Living Expenses: Not hard at all  Food Insecurity: No Food Insecurity   Worried About Programme researcher, broadcasting/film/video in the Last Year: Never true   Ran Out of Food in the Last Year: Never true  Housing: Low Risk     Last Housing Risk Score: 0  Physical Activity: Sufficiently Active   Days of Exercise per Week: 7 days   Minutes of Exercise per Session: 40 min  Social Connections: Socially Integrated   Frequency of Communication with Friends  and Family: More than three times a week   Frequency of Social Gatherings with Friends and Family: More than three times a week   Attends Religious Services: More than 4 times per year   Active Member of [employer] or Organizations: Yes   Attends Banker Meetings: 1 to 4 times per year   Marital Status: Married  Stress: No Stress Concern Present   Feeling of Stress : Not at all  Tobacco Use: Low Risk    Smoking Tobacco Use: Never   Smokeless Tobacco Use: Never   Passive Exposure: Not on file  Transportation Needs: No Transportation Needs   Lack of Transportation (Medical): No   Lack of Transportation (Non-Medical): No    CCM Care Plan  Allergies  Allergen Reactions   Cefdinir    Cholestyramine     GERD   Contrast Media [Iodinated Contrast Media] Diarrhea   Ezetimibe    Meperidine    Nitrofurantoin Diarrhea   Statins    Teriparatide     Medications Reviewed Today     Reviewed by Hettie Holstein, CMA (Certified Medical Assistant) on 06/23/21 at 1257  Med List Status: <None>   Medication Order Taking? Sig Documenting Provider Last Dose Status Informant  Acetaminophen (TYLENOL PO) [id number] Yes Take by mouth as needed. [provider] Taking Active   Calcium Citrate-Vitamin D 315-250 MG-UNIT TABS [id number] Yes Take by mouth. [provider] Taking Active   carbidopa-levodopa (SINEMET CR) 50-200 MG tablet [id number] Yes Take 1 tablet by mouth 3 (three) times daily. Huston Foley, MD Taking Active   cycloSPORINE (RESTASIS) 0.05 % ophthalmic emulsion [id number] No SMARTSIG:In Eye(s)  Patient not taking: Reported on 06/23/2021   [provider] Not Taking Active   denosumab (PROLIA) 60 MG/ML SOSY injection [id number] Yes  Inject 60 mg into the skin every 6 (six) months. [provider] Taking Active   diazepam (VALIUM) 5 MG tablet [id number]   [provider]  Consider Medication Status and Discontinue (Completed Course) Self  diphenhydrAMINE (BENADRYL) 12.5 MG chewable tablet [id number] Yes Chew 12.5 mg by mouth every 6 (six) hours as needed. [provider] Taking Active   EPINEPHrine 0.3 mg/0.3 mL IJ SOAJ injection [id number] Yes INJECT INTO THE MIDDLE OF THE OUTER THIGH AND HOLD FOR 3 SECONDS AS NEEDED FOR SEVERE ALLERGIC REACTION THEN CALL 911 IF USED. Ellamae Sia, DO Taking Active   fluticasone [facility]) 50 MCG/ACT nasal spray [id number] Yes PLACE 2 SPRAYS IN EACH NOSTRIL DAILY AS NEEDED FOR ALLERGIES OR RHINITIS Ellamae Sia, DO Taking Active   levothyroxine (SYNTHROID) 88 MCG tablet [id number] Yes TAKE ONE TABLET BY MOUTH DAILY FOR Neita Garnet, MD Taking Active   Melatonin 1 MG CAPS [id number] Yes Take by mouth as needed. [provider] Taking Active   Multiple Vitamins-Minerals (CENTRUM SILVER PO) [id number] Yes Take by mouth. [provider] Taking Active   naproxen sodium (ALEVE) 220 MG tablet [id number] Yes Take 220 mg by mouth as needed. [provider] Taking Active   NON FORMULARY [id number] Yes [facility]  Creams-#11 Peripheral Neuropathy cream [provider] Taking Active   pramipexole (MIRAPEX) 0.25 MG tablet [id number] Yes TAKE 1 TABLET BY MOUTH AT BEDTIME York Spaniel, MD Taking Active   Probiotic Product (PROBIOTIC DAILY PO) [id number] Yes Take by mouth. [provider] Taking Active   SALINE NASAL MIST NA [id number] Yes Place into the nose. [provider] Taking Active   sulfamethoxazole-trimethoprim (BACTRIM DS)  800-160 MG tablet [id number] Yes Take 1 tablet by mouth 2 (two) times daily. Jeani Sow, MD Taking Active   tiZANidine (ZANAFLEX) 4 MG tablet [id number] Yes TAKE 1 TABLET (4 MG TOTAL)  BY MOUTH 2 (TWO) TIMES DAILY AS NEEDED FOR MUSCLE SPASMS. Willow Ora, MD Taking Active   traMADol Janean Sark) 50 MG tablet [id number] Yes Take 1 tablet (50 mg total) by mouth 2 (two) times daily as needed for moderate pain. 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 02/07/2018   Seasonal and perennial allergic rhinitis 02/01/2018   Anxiety 09/29/2017   Parkinson's disease (HCC) 09/29/2017   Dysfunctional voiding of urine 09/13/2016   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   Gastroesophageal reflux disease 09/13/2016   Age-related osteoporosis without current pathological fracture 07/20/2016   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   Mixed hyperlipidemia 03/07/2015   Impaired fasting glucose 03/07/2015   Atrophic vaginitis 02/13/2015   History of colon cancer 02/13/2015   Hypothyroidism due to Hashimoto's thyroiditis 02/13/2015   Irritable bowel syndrome with both constipation and diarrhea 02/13/2015   History of breast cancer left, 2016 02/13/2015   Recurrent UTI 02/13/2015   Vitamin D deficiency disease 02/13/2015    Immunization History  Administered Date(s) Administered   Fluad Quad(high Dose 65+) 12/16/2018, 01/01/2020, 01/01/2021   Influenza, High Dose Seasonal PF 01/05/2015   Influenza,inj,Quad PF,6+ Mos 01/10/2018   Moderna Sars-Covid-2 Vaccination 04/10/2019, 05/08/2019   PFIZER(Purple Top)SARS-COV-2 Vaccination 02/02/2020   Pfizer Covid-19 Vaccine  Bivalent Booster 38yrs & up 01/01/2021   Pneumococcal Conjugate-13 01/16/2014   Pneumococcal Polysaccharide-23 12/10/2007, 01/29/2015   Zoster Recombinat (Shingrix) 10/15/2017, 01/12/2018, 02/17/2018    Conditions to be addressed/monitored:  GERD, Hypothyroidism, Parkinson's, Anxiety, HLD, RLS  There are no care plans that you recently modified to display for this patient.    Medication Assistance: {MEDASSISTANCEINFO:25044}  Compliance/Adherence/Medication fill history: Care Gaps: ***  Star-Rating Drugs: ***  Patient's preferred pharmacy is:  [id number] - [place], [place] - [address] [place] [address] Phone: [phone] Fax: [phone]  Uses pill box? {Yes or If no, why not?:20788} Pt endorses ***% compliance  We discussed: {Pharmacy options:24294} Patient decided to: {US Pharmacy Plan:23885}  Care Plan and Follow Up Patient Decision:  {FOLLOWUP:24991}  Plan: {CM FOLLOW UP CZYS:06301}  ***  Current Barriers:  {pharmacybarriers:24917}  Pharmacist Clinical Goal(s):  Patient will {PHARMACYGOALCHOICES:24921} through collaboration with PharmD and provider.   Interventions: 1:1 collaboration with Willow Ora, MD regarding development and update of comprehensive plan of care as evidenced by provider attestation and co-signature Inter-disciplinary care team collaboration (see longitudinal plan of care) Comprehensive medication review performed; medication list updated in electronic medical record  Hyperlipidemia: (LDL goal < ***) -{US controlled/uncontrolled:25276} -Current treatment: *** -Medications previously tried: ***  -Current dietary patterns: *** -Current exercise habits: *** -  Educated on {CCM HLD Counseling:25126} -{CCMPHARMDINTERVENTION:25122}  Depression/Anxiety (Goal: ***) -{US controlled/uncontrolled:25276} -Current treatment: *** -Medications previously tried/failed:  *** -PHQ9: *** -GAD7: *** -Connected with *** for mental health support -Educated on {CCM mental health counseling:25127} -{CCMPHARMDINTERVENTION:25122}  Hypothydoisism (Goal: ***) -{US controlled/uncontrolled:25276} -Current treatment  *** -Medications previously tried: ***  -{CCMPHARMDINTERVENTION:25122}  Parkinson's (Goal: ***) -{US controlled/uncontrolled:25276} -Current treatment  *** -Medications previously tried: ***  -{CCMPHARMDINTERVENTION:25122}  RLS (Goal: ***) -{US controlled/uncontrolled:25276} -Current treatment  *** -Medications previously tried: ***  -{CCMPHARMDINTERVENTION:25122}  GERD (Goal: ***) -{US controlled/uncontrolled:25276} -Current treatment  *** -Medications previously tried: ***  -{CCMPHARMDINTERVENTION:25122}  Patient Goals/Self-Care Activities Patient will:  - {pharmacypatientgoals:24919}  Follow Up Plan: {CM FOLLOW UP ZOXW:96045}

## 2021-07-09 ENCOUNTER — Telehealth: Payer: Self-pay | Admitting: Pharmacist

## 2021-07-09 ENCOUNTER — Encounter: Payer: Medicare HMO | Admitting: Physical Therapy

## 2021-07-09 NOTE — Progress Notes (Signed)
? ? ?Chronic Care Management ?Pharmacy Assistant  ? ?Name: Julia [name]  MRN: [id number] DOB: Jul 21, 1940 ? ? ?Reason for Encounter: Chart Review For Initial Visit With Clinical Pharmacist ? ?Conditions to be addressed/monitored: ?Hypothyroidism, Polyneuropathy, Parkinson's disease, GERD, Osteoporosis, Hyperlipidemia ? ?Primary concerns for visit include: ?Hypothyroidism, Hyperlipidemia  ? ?Recent office visits:  ?05/20/2021 OV (Family Medicine) Tawnya Crook, MD; Rx for Bactrim DS two times daily for UTI. ? ?03/19/2021 OV (PCP) Leamon Arnt, MD;  Recommend moist heat, range of motion exercises, muscle relaxer, prednisone taper and she may try tramadol in addition to ibuprofen if needed and as needed for pain.  No red flag symptoms.  ? ?02/25/2021 OV (Family Medicine) Allwardt, Randa Evens, PA-C; Will send urine for culture and treat with bactrim at this time.  Increase water intake. May take AZO for symptomatic relief. ? ?02/05/2021 OV (PCP) Leamon Arnt, MD; no medication changes indicated. ? ?01/27/2021 OV (Family Medicine) Jeanie Sewer, NP; Rx Bactrim DS BID for cystitis. ? ?Recent consult visits:  ?06/23/2021 OV (Neurology) Star Age. MD;  I suggested we could start her on low-dose generic Lexapro 5 mg once daily in the morning.  We can consider this at the next appointment. ? ?05/21/2021 OV (Podiatry) Gardiner Barefoot, DPM; no medication changes indicated. ? ?02/26/2021 OV (Oncology) Truitt Merle, MD; no medication changes indicated. ? ?Hospital visits:  ?03/09/2021 ED visit (urgent care) for UTI ?-Bactrim DS BID x 7 days ? ?Medications: ?Outpatient Encounter Medications as of 07/09/2021  ?Medication Sig Note  ? Acetaminophen (TYLENOL PO) Take by mouth as needed.   ? Calcium Citrate-Vitamin D 315-250 MG-UNIT TABS Take by mouth.   ? carbidopa-levodopa (SINEMET CR) 50-200 MG tablet Take 1 tablet by mouth 3 (three) times daily.   ? cycloSPORINE (RESTASIS) 0.05 % ophthalmic emulsion SMARTSIG:In Eye(s)  (Patient not taking: Reported on 06/23/2021)   ? denosumab (PROLIA) 60 MG/ML SOSY injection Inject 60 mg into the skin every 6 (six) months.   ? diazepam (VALIUM) 5 MG tablet    ? diphenhydrAMINE (BENADRYL) 12.5 MG chewable tablet Chew 12.5 mg by mouth every 6 (six) hours as needed.   ? EPINEPHrine 0.3 mg/0.3 mL IJ SOAJ injection INJECT INTO THE MIDDLE OF THE OUTER THIGH AND HOLD FOR 3 SECONDS AS NEEDED FOR SEVERE ALLERGIC REACTION THEN CALL 911 IF USED.   ? fluticasone (FLONASE) 50 MCG/ACT nasal spray PLACE 2 SPRAYS IN EACH NOSTRIL DAILY AS NEEDED FOR ALLERGIES OR RHINITIS   ? levothyroxine (SYNTHROID) 88 MCG tablet TAKE ONE TABLET BY MOUTH DAILY FOR BREAKFAST   ? Melatonin 1 MG CAPS Take by mouth as needed.   ? Multiple Vitamins-Minerals (CENTRUM SILVER PO) Take by mouth.   ? naproxen sodium (ALEVE) 220 MG tablet Take 220 mg by mouth as needed.   ? [facility] ? ?Creams-#11 Peripheral Neuropathy cream   ? pramipexole (MIRAPEX) 0.25 MG tablet Take 1 tablet (0.25 mg total) by mouth at bedtime.   ? Probiotic Product (PROBIOTIC DAILY PO) Take by mouth.   ? SALINE NASAL MIST NA Place into the nose.   ? sulfamethoxazole-trimethoprim (BACTRIM DS) 800-160 MG tablet Take 1 tablet by mouth 2 (two) times daily.   ? tiZANidine (ZANAFLEX) 4 MG tablet TAKE 1 TABLET (4 MG TOTAL) BY MOUTH 2 (TWO) TIMES DAILY AS NEEDED FOR MUSCLE SPASMS.   ? traMADol (ULTRAM) 50 MG tablet Take 1 tablet (50 mg total) by mouth 2 (two) times daily as needed for moderate pain.   ?  UNABLE TO FIND Med Name: Allergy shots 1/week 02/05/2021: Every 3 weeks  ? ?No facility-administered encounter medications on file as of 07/09/2021.  ? ?Current Medications: ?Pramipexole 0.25 mg last filled 06/23/2021 90 DS ?Carbidopa-levodopa 50-200 mg last filled 06/23/2021 90 DS ?Bactrim DS last filled 05/20/2021 7 DS ?Diazepam 5 mg last filled 05/06/2021 2 DS ?Tizanidine 4 mg last filled 04/21/2021 15 DS ?Levothyroxine 88 mcg last filled 10/07/20222 90  DS ?Fluticasone 50 mcg/act last filled 03/25/2021 90 DS ?Tramadol 50 mg last filled 03/19/2021 10 DS ?Cyclosporine 0.05% ophthalmic solution last filled 02/03/2021 90 DS ?Naproxen Sodium 220 mg ?Acetaminophen  ?Saline Nasal mist ?Benadryl 12.5 mg ?Epinephrine 0.3 mg/0.3 mL last filled 07/04/2020 2 DS ?Melatonin 1 mg ?Prolia 60 mg/mL ? ?Patient Questions: ?Any changes in your medications or health? ? ?Any side effects from any medications?  ? ?Do you have any symptoms or problems not managed by your medications? ? ?Any concerns about your health right now? ? ?Has your provider asked that you check blood pressure, blood sugar, or follow special diet at home? ? ?Do you get any type of exercise on a regular basis? ? ?Can you think of a goal you would like to reach for your health? ? ?Do you have any problems getting your medications? ? ?Is there anything that you would like to discuss during the appointment?  ? ?Please bring medications and supplements to appointment ? ?**Patient cancelled this appointment. She did not reschedule.** ? ?Care Gaps: ?Medicare Annual Wellness: Completed 02/24/2021 ?Hemoglobin A1C: 6.4% on 02/05/2021 ?Colonoscopy: Completed 03/30/2016  ?Dexa Scan: Next due on 09/21/2022 ?Mammogram: Next due on 09/20/2021 ? ?Future Appointments  ?Date Time Provider [place]  ?07/14/2021 11:00 AM [facility] CCM PHARMACIST [facility]  ?08/08/2021 10:30 AM Leamon Arnt, MD [facility]  ?10/02/2021  1:45 PM Darden Dates Janett Billow, NP [facility] None  ?03/09/2022  8:45 AM [facility] HEALTH COACH [facility]  ? ?[facility]: ? ?April D Calhoun, [place] ?Clinical Pharmacist Assistant ?[phone] ?

## 2021-07-11 ENCOUNTER — Telehealth: Payer: Self-pay

## 2021-07-11 ENCOUNTER — Encounter: Payer: Medicare HMO | Admitting: Physical Therapy

## 2021-07-11 DIAGNOSIS — J3089 Other allergic rhinitis: Secondary | ICD-10-CM

## 2021-07-11 NOTE — Progress Notes (Signed)
EXP 07/12/22 ?

## 2021-07-11 NOTE — Telephone Encounter (Signed)
Last Prolia inj 10/30/20 ?Next Prolia inj due 05/03/21 ?

## 2021-07-14 ENCOUNTER — Telehealth: Payer: Medicare HMO

## 2021-07-14 DIAGNOSIS — J302 Other seasonal allergic rhinitis: Secondary | ICD-10-CM

## 2021-07-15 NOTE — Telephone Encounter (Signed)
Prior auth required for PROLIA  PA PROCESS DETAILS: Precertification is required. Call [phone] or complete the Precertification form available at [email or web address] 

## 2021-07-22 ENCOUNTER — Telehealth: Payer: Self-pay

## 2021-07-22 ENCOUNTER — Ambulatory Visit (INDEPENDENT_AMBULATORY_CARE_PROVIDER_SITE_OTHER): Payer: Medicare HMO

## 2021-07-22 DIAGNOSIS — J309 Allergic rhinitis, unspecified: Secondary | ICD-10-CM | POA: Diagnosis not present

## 2021-07-22 MED ORDER — FLUTICASONE PROPIONATE 50 MCG/ACT NA SUSP: NASAL | 3 refills | Status: DC

## 2021-07-22 NOTE — Telephone Encounter (Signed)
Patient came in for allergy injection and stated she needed a 90 day supply refill on [facility] nasal spray [facility] [place]. ? [place] [phone] ? ? ?

## 2021-07-23 NOTE — Telephone Encounter (Signed)
Pt ready for scheduling on or after 05/03/21 ?Will require new prior auth if scheduled after 09/10/21 ? ?Out-of-pocket cost due at time of visit: $0 ? ?Primary: A[employer] Medicare ?Prolia co-insurance: 0% ?Admin fee co-insurance: 0% ? ?Secondary: n/a ?Prolia co-insurance:  ?Admin fee co-insurance:  ? ?Deductible: does not apply ? ?Prior Auth: APPROVED ?PA# [id number] ?Valid: 09/10/20-09/10/21 ?  ? ?** This summary of benefits is an estimation of the patient's out-of-pocket cost. Exact cost may very based on individual plan coverage.  ? ?

## 2021-07-23 NOTE — Telephone Encounter (Signed)
Prior auth APPROVED via [employer] ?PA# [id number] ? ? ?

## 2021-07-24 NOTE — Telephone Encounter (Signed)
I have added Prolia to patients appt for 5/12.  Please order Prolia.

## 2021-07-26 ENCOUNTER — Encounter: Payer: Self-pay | Admitting: Neurology

## 2021-07-28 MED ORDER — PRAMIPEXOLE DIHYDROCHLORIDE 0.25 MG PO TABS
0.2500 mg | ORAL_TABLET | Freq: Two times a day (BID) | ORAL | 3 refills | Status: DC
Start: 2021-07-28 — End: 2021-08-20

## 2021-07-28 NOTE — Telephone Encounter (Signed)
I suggested increasing her Mirapex.  Nothing further needed, [facility]. ?

## 2021-07-29 NOTE — Telephone Encounter (Signed)
Prolia ordered and available in the office.  ?

## 2021-08-07 ENCOUNTER — Other Ambulatory Visit: Payer: Self-pay | Admitting: Hematology

## 2021-08-07 DIAGNOSIS — Z1231 Encounter for screening mammogram for malignant neoplasm of breast: Secondary | ICD-10-CM

## 2021-08-08 ENCOUNTER — Encounter: Payer: Self-pay | Admitting: Family Medicine

## 2021-08-08 ENCOUNTER — Other Ambulatory Visit: Payer: Self-pay | Admitting: *Deleted

## 2021-08-08 ENCOUNTER — Ambulatory Visit (INDEPENDENT_AMBULATORY_CARE_PROVIDER_SITE_OTHER): Payer: Medicare HMO | Admitting: Family Medicine

## 2021-08-08 VITALS — BP 120/80 | HR 85 | Temp 97.8°F | Ht 65.0 in | Wt 138.0 lb

## 2021-08-08 DIAGNOSIS — M810 Age-related osteoporosis without current pathological fracture: Secondary | ICD-10-CM

## 2021-08-08 DIAGNOSIS — G620 Drug-induced polyneuropathy: Secondary | ICD-10-CM | POA: Diagnosis not present

## 2021-08-08 DIAGNOSIS — G20 Parkinson's disease: Secondary | ICD-10-CM | POA: Diagnosis not present

## 2021-08-08 DIAGNOSIS — N1831 Chronic kidney disease, stage 3a: Secondary | ICD-10-CM | POA: Diagnosis not present

## 2021-08-08 DIAGNOSIS — R7301 Impaired fasting glucose: Secondary | ICD-10-CM | POA: Diagnosis not present

## 2021-08-08 LAB — BASIC METABOLIC PANEL
BUN: 17 mg/dL (ref 6–23)
CO2: 30 mEq/L (ref 19–32)
Calcium: 10 mg/dL (ref 8.4–10.5)
Chloride: 98 mEq/L (ref 96–112)
Creatinine, Ser: 0.98 mg/dL (ref 0.40–1.20)
GFR: 55.12 mL/min — ABNORMAL LOW (ref >60.00)
Glucose, Bld: 93 mg/dL (ref 70–99)
Potassium: 4.4 mEq/L (ref 3.5–5.1)
Sodium: 136 mEq/L (ref 135–145)

## 2021-08-08 LAB — MICROALBUMIN / CREATININE URINE RATIO
Creatinine,U: 40.9 mg/dL
Microalb Creat Ratio: 1.7 mg/g (ref 0.0–30.0)
Microalb, Ur: <0.7 mg/dL (ref 0.0–1.9)

## 2021-08-08 LAB — HEMOGLOBIN A1C: Hgb A1c MFr Bld: 6.2 % (ref 4.6–6.5)

## 2021-08-08 MED ORDER — LEVOTHYROXINE SODIUM 88 MCG PO TABS: ORAL_TABLET | ORAL | 3 refills | Status: DC

## 2021-08-08 MED ORDER — DENOSUMAB 60 MG/ML ~~LOC~~ SOSY
60.0000 mg | PREFILLED_SYRINGE | Freq: Once | SUBCUTANEOUS | Status: AC
  Administered 2021-08-08: 60 mg via SUBCUTANEOUS

## 2021-08-08 NOTE — Patient Instructions (Signed)
Please return in 6 months for your annual complete physical; please come fasting and next due prolia injection.  ? ?I will release your lab results to you on your MyChart account with further instructions. You may see the results before I do, but when I review them I will send you a message with my report or have my assistant call you if things need to be discussed. Please reply to my message with any questions. Thank you!  ? ?If you have any questions or concerns, please don't hesitate to send me a message via MyChart or call the office at [phone]. Thank you for visiting with [place] today! It's our pleasure caring for you.  ?

## 2021-08-08 NOTE — Progress Notes (Signed)
? ? ?Subjective  ?CC:  ?Chief Complaint  ?Patient presents with  ? Follow-up  ?  6 month follow-up ?Not fasting ?  ? ? ?HPI: Julia [name] is a 81 y.o. female who presents to the office today to address the problems listed above in the chief complaint. ?Parkinson's disease: Mild worsening.  Following along with neurology.  No falls. ?Impaired fasting glucose with last A1c 6.46 months ago.  She admits to polyuria and polydipsia but these are chronic.  She eats well.  Weight is stable.  Due for recheck ?Polyneuropathy persists.  Managing ?Osteoporosis: Due for Prolia injection today.  Tolerating well. ?Chronic kidney disease: Monitoring.  No lower extremity edema. ? ?Assessment  ?1. Parkinson's disease ([place])   ?2. Impaired fasting glucose   ?3. Drug-induced polyneuropathy ([place])   ?4. Age-related osteoporosis without current pathological fracture   ?5. Stage 3a chronic kidney disease ([place])   ? ?  ?Plan  ?Parkinson's: Fall prevention.  Monitor cognitive status.  Following along with neurology. ?Recheck A1c today.  Discussed healthy diet ?Osteoporosis: Prolia injection today ?Monitor kidney disease.  Check urine for proteinuria patient can give [place] a sample.  Otherwise we will check in 6 months ? ?Follow up: 6 months for complete physical ?Visit date not found ? ?Orders Placed This Encounter  ?Procedures  ? Hemoglobin A1c  ? Basic metabolic panel  ? Microalbumin / creatinine urine ratio  ? ?No orders of the defined types were placed in this encounter. ? ?  ? ?I reviewed the patients updated PMH, FH, and SocHx.  ?  ?Patient Active Problem List  ? Diagnosis Date Noted  ? Statin intolerance 10/13/2018  ?  Priority: High  ? Parkinson's disease ([place]) 09/29/2017  ?  Priority: High  ? Age-related osteoporosis without current pathological fracture 07/20/2016  ?  Priority: High  ? Mixed hyperlipidemia 03/07/2015  ?  Priority: High  ? Impaired fasting glucose 03/07/2015  ?  Priority: High  ? History of colon cancer 02/13/2015  ?   Priority: High  ? Hypothyroidism due to Hashimoto's thyroiditis 02/13/2015  ?  Priority: High  ? History of breast cancer left, 2016 02/13/2015  ?  Priority: High  ? Multiple pulmonary nodules 06/20/2019  ?  Priority: Medium   ? RLS (restless legs syndrome) 07/14/2018  ?  Priority: Medium   ? 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  Priority: Medium   ? Anxiety 09/29/2017  ?  Priority: Medium   ? Dysfunctional voiding of urine 09/13/2016  ?  Priority: Medium   ? 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  Priority: Medium   ? Gastroesophageal reflux disease 09/13/2016  ?  Priority: Medium   ? Drug-induced polyneuropathy ([place]) 03/07/2015  ?  Priority: Medium   ? Irritable bowel syndrome with both constipation and diarrhea 02/13/2015  ?  Priority: Medium   ? Pelvic floor dysfunction 08/26/2018  ?  Priority: Low  ? Female bladder prolapse 04/07/2018  ?  Priority: Low  ? 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  Priority: Low  ? Seasonal and perennial allergic rhinitis 02/01/2018  ?  Priority: Low  ? Atrophic vaginitis 02/13/2015  ?  Priority: Low  ? Recurrent UTI 02/13/2015  ?  Priority: Low  ? Vitamin D deficiency disease 02/13/2015  ?  Priority: Low  ? ?Current Meds  ?Medication Sig  ? Acetaminophen (TYLENOL PO) Take by mouth as needed.  ? Calcium Citrate-Vitamin D 315-250 MG-UNIT TABS Take by  mouth.  ? carbidopa-levodopa (SINEMET CR) 50-200 MG tablet Take 1 tablet by mouth 3 (three) times daily.  ? cycloSPORINE (RESTASIS) 0.05 % ophthalmic emulsion   ? denosumab (PROLIA) 60 MG/ML SOSY injection Inject 60 mg into the skin every 6 (six) months.  ? diazepam (VALIUM) 5 MG tablet   ? diphenhydrAMINE (BENADRYL) 12.5 MG chewable tablet Chew 12.5 mg by mouth every 6 (six) hours as needed.  ? EPINEPHrine 0.3 mg/0.3 mL IJ SOAJ injection INJECT INTO THE MIDDLE OF THE OUTER THIGH AND HOLD FOR 3 SECONDS AS NEEDED FOR SEVERE ALLERGIC REACTION THEN CALL 911 IF USED.  ? fluticasone (FLONASE) 50 MCG/ACT nasal  spray PLACE 2 SPRAYS IN EACH NOSTRIL DAILY AS NEEDED FOR ALLERGIES OR RHINITIS  ? levothyroxine (SYNTHROID) 88 MCG tablet TAKE ONE TABLET BY MOUTH DAILY FOR BREAKFAST  ? Melatonin 1 MG CAPS Take by mouth as needed.  ? Multiple Vitamins-Minerals (CENTRUM SILVER PO) Take by mouth.  ? naproxen sodium (ALEVE) 220 MG tablet Take 220 mg by mouth as needed.  ? [facility] ? ?Creams-#11 Peripheral Neuropathy cream  ? pramipexole (MIRAPEX) 0.25 MG tablet Take 1 tablet (0.25 mg total) by mouth 2 (two) times daily. Take 1 pill 2 hours before bedtime and 1 pill at bedtime.  ? Probiotic Product (PROBIOTIC DAILY PO) Take by mouth.  ? SALINE NASAL MIST NA Place into the nose.  ? tiZANidine (ZANAFLEX) 4 MG tablet TAKE 1 TABLET (4 MG TOTAL) BY MOUTH 2 (TWO) TIMES DAILY AS NEEDED FOR MUSCLE SPASMS.  ? traMADol (ULTRAM) 50 MG tablet Take 1 tablet (50 mg total) by mouth 2 (two) times daily as needed for moderate pain.  ? UNABLE TO FIND Med Name: Allergy shots 1/week  ? [DISCONTINUED] sulfamethoxazole-trimethoprim (BACTRIM DS) 800-160 MG tablet Take 1 tablet by mouth 2 (two) times daily.  ? ? ?Allergies: ?Patient is allergic to cefdinir, cholestyramine, contrast media [iodinated contrast media], ezetimibe, meperidine, nitrofurantoin, statins, and teriparatide. ?Family History: ?Patient family history includes Arthritis in her father, mother, and sister; Cancer in her brother, father, sister, sister, and sister; Depression in her father; Diabetes in her mother and sister; Heart disease in her mother; Hyperlipidemia in her brother and sister; Hypertension in her mother; Osteoporosis in her father. ?Social History:  ?Patient  reports that she has never smoked. She has never used smokeless tobacco. She reports current alcohol use. She reports that she does not use drugs. ? ?Review of Systems: ?Constitutional: Negative for fever malaise or anorexia ?Cardiovascular: negative for chest pain ?Respiratory: negative for SOB  or persistent cough ?Gastrointestinal: negative for abdominal pain ? ?Objective  ?Vitals: BP 120/80   Pulse 85   Temp 97.8 ?F (36.6 ?C) (Temporal)   Ht '5\' 5"'$  (1.651 m)   Wt 138 lb (62.6 kg)   SpO2 98%   BMI 22.96 kg/m?  ?General: no acute distress , A&Ox3 ?HEENT: PEERL, conjunctiva normal, neck is supple ?Cardiovascular:  RRR without murmur or gallop.  ?Respiratory:  Good breath sounds bilaterally, CTAB with normal respiratory effort ?Skin:  Warm, no rashes ? ?Commons side effects, risks, benefits, and alternatives for medications and treatment plan prescribed today were discussed, and the patient expressed understanding of the given instructions. Patient is instructed to call or message via MyChart if he/she has any questions or concerns regarding our treatment plan. No barriers to understanding were identified. We discussed Red Flag symptoms and signs in detail. Patient expressed understanding regarding what to do in case of urgent or emergency  type symptoms.  ?Medication list was reconciled, printed and provided to the patient in AVS. Patient instructions and summary information was reviewed with the patient as documented in the AVS. ?This note was prepared with assistance of Systems analyst. Occasional wrong-word or sound-a-like substitutions may have occurred due to the inherent limitations of voice recognition software ? ?This visit occurred during the SARS-CoV-2 public health emergency.  Safety protocols were in place, including screening questions prior to the visit, additional usage of staff PPE, and extensive cleaning of exam room while observing appropriate contact time as indicated for disinfecting solutions.  ? ?

## 2021-08-09 ENCOUNTER — Other Ambulatory Visit: Payer: Self-pay | Admitting: Neurology

## 2021-08-11 ENCOUNTER — Encounter: Payer: Self-pay | Admitting: *Deleted

## 2021-08-12 ENCOUNTER — Ambulatory Visit (INDEPENDENT_AMBULATORY_CARE_PROVIDER_SITE_OTHER): Payer: Medicare HMO | Admitting: *Deleted

## 2021-08-12 DIAGNOSIS — J309 Allergic rhinitis, unspecified: Secondary | ICD-10-CM

## 2021-08-20 ENCOUNTER — Other Ambulatory Visit: Payer: Self-pay | Admitting: *Deleted

## 2021-08-20 MED ORDER — PRAMIPEXOLE DIHYDROCHLORIDE 0.25 MG PO TABS: 0.2500 mg | ORAL_TABLET | Freq: Two times a day (BID) | ORAL | 3 refills | Status: DC

## 2021-08-20 MED ORDER — CARBIDOPA-LEVODOPA ER 50-200 MG PO TBCR: 1.0000 | EXTENDED_RELEASE_TABLET | Freq: Three times a day (TID) | ORAL | 3 refills | Status: DC

## 2021-08-21 ENCOUNTER — Other Ambulatory Visit: Payer: Self-pay

## 2021-08-21 MED ORDER — FLUTICASONE PROPIONATE 50 MCG/ACT NA SUSP: NASAL | 0 refills | Status: DC

## 2021-08-26 NOTE — Telephone Encounter (Signed)
Last Prolia inj 08/08/21 Next Prolia inj due 02/09/22  PA# [id number] Valid: 09/10/20-09/10/21

## 2021-09-05 ENCOUNTER — Ambulatory Visit (INDEPENDENT_AMBULATORY_CARE_PROVIDER_SITE_OTHER): Payer: Medicare HMO | Admitting: Family

## 2021-09-05 ENCOUNTER — Encounter: Payer: Self-pay | Admitting: Family

## 2021-09-05 VITALS — BP 116/69 | HR 101 | Temp 98.4°F | Ht 65.0 in | Wt 140.0 lb

## 2021-09-05 DIAGNOSIS — R350 Frequency of micturition: Secondary | ICD-10-CM | POA: Diagnosis not present

## 2021-09-05 DIAGNOSIS — N3090 Cystitis, unspecified without hematuria: Secondary | ICD-10-CM | POA: Diagnosis not present

## 2021-09-05 LAB — POCT URINALYSIS DIPSTICK
Bilirubin, UA: NEGATIVE
Blood, UA: POSITIVE
Glucose, UA: NEGATIVE
Ketones, UA: NEGATIVE
Nitrite, UA: NEGATIVE
Protein, UA: NEGATIVE
Spec Grav, UA: 1.02 (ref 1.010–1.025)
Urobilinogen, UA: 0.2 E.U./dL
pH, UA: 6 (ref 5.0–8.0)

## 2021-09-05 MED ORDER — SULFAMETHOXAZOLE-TRIMETHOPRIM 800-160 MG PO TABS: 1.0000 | ORAL_TABLET | Freq: Two times a day (BID) | ORAL | 0 refills | Status: DC

## 2021-09-05 NOTE — Patient Instructions (Addendum)
It was very nice to see you today!   An antibiotic, Bactrim, has been sent to your pharmacy, start today. We will call or notify you via MyChart if the urine culture indicates a different antibiotic to be used. Drink at least 2 liters = 64oz = 8 cups of water daily.      PLEASE NOTE:  If you had any lab tests please let us know if you have not heard back within a few days. You may see your results on MyChart before we have a chance to review them but we will give you a call once they are reviewed by [place]. If we ordered any referrals today, please let us know if you have not heard from their office within the next week.

## 2021-09-05 NOTE — Progress Notes (Signed)
Subjective:     Patient ID: Julia [name], female    DOB: [birth year], 81 y.o.   MRN: [id number]  Chief Complaint  Patient presents with   Urinary Frequency    Pt c/o frequent urination and discomfort.  Present for 4 days and has not tried anything for It   HPI: Urinary symptoms: Patient c/o  dysuria, frequency, urgency, flank pain, pelvic pain, low back pain, foul odor, cloudy urine, hematuria. Other sx: denies vaginal d/c or vaginal itching. Duration of sx: 4 days; Home tx: none; Denies  nausea, fever. Reports last UTI in February.   Assessment & Plan:   Problem List Items Addressed This Visit   None Visit Diagnoses     Urinary frequency    -  Primary   Relevant Orders   POCT Urinalysis Dipstick (Completed)   Cystitis  - Last UTI in Feb, treated with Bactrim, resolved, culture sent out. Advised on med use & SE, proper daily water hydration, urinary hygiene.        Relevant Medications   sulfamethoxazole-trimethoprim (BACTRIM DS) 800-160 MG tablet   Other Relevant Orders   Urine Culture      Outpatient Medications Prior to Visit  Medication Sig Dispense Refill   Acetaminophen (TYLENOL PO) Take by mouth as needed.     Calcium Citrate-Vitamin D 315-250 MG-UNIT TABS Take by mouth.     carbidopa-levodopa (SINEMET CR) 50-200 MG tablet Take 1 tablet by mouth 3 (three) times daily. 270 tablet 3   cycloSPORINE (RESTASIS) 0.05 % ophthalmic emulsion      denosumab (PROLIA) 60 MG/ML SOSY injection Inject 60 mg into the skin every 6 (six) months.     diazepam (VALIUM) 5 MG tablet      diphenhydrAMINE (BENADRYL) 12.5 MG chewable tablet Chew 12.5 mg by mouth every 6 (six) hours as needed.     EPINEPHrine 0.3 mg/0.3 mL IJ SOAJ injection INJECT INTO THE MIDDLE OF THE OUTER THIGH AND HOLD FOR 3 SECONDS AS NEEDED FOR SEVERE ALLERGIC REACTION THEN CALL 911 IF USED. 2 each 1   fluticasone (FLONASE) 50 MCG/ACT nasal spray PLACE 2 SPRAYS IN EACH NOSTRIL DAILY AS NEEDED FOR ALLERGIES OR RHINITIS  48 g 0   levothyroxine (SYNTHROID) 88 MCG tablet TAKE ONE TABLET BY MOUTH DAILY FOR BREAKFAST 90 tablet 3   Melatonin 1 MG CAPS Take by mouth as needed.     Multiple Vitamins-Minerals (CENTRUM SILVER PO) Take by mouth.     naproxen sodium (ALEVE) 220 MG tablet Take 220 mg by mouth as needed.     NON [facility]  Creams-#11 Peripheral Neuropathy cream     pramipexole (MIRAPEX) 0.25 MG tablet Take 1 tablet (0.25 mg total) by mouth 2 (two) times daily. Take 1 pill 2 hours before bedtime and 1 pill at bedtime. 60 tablet 3   Probiotic Product (PROBIOTIC DAILY PO) Take by mouth.     SALINE NASAL MIST NA Place into the nose.     tiZANidine (ZANAFLEX) 4 MG tablet TAKE 1 TABLET (4 MG TOTAL) BY MOUTH 2 (TWO) TIMES DAILY AS NEEDED FOR MUSCLE SPASMS. 180 tablet 1   traMADol (ULTRAM) 50 MG tablet Take 1 tablet (50 mg total) by mouth 2 (two) times daily as needed for moderate pain. 20 tablet 1   UNABLE TO FIND Med Name: Allergy shots 1/week     No facility-administered medications prior to visit.    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  Constitutional:      Appearance: Normal appearance.  Cardiovascular:     Rate and Rhythm: Normal rate and regular rhythm.  Pulmonary:     Effort: Pulmonary effort is normal.     Breath sounds: Normal breath sounds.  Musculoskeletal:        General: Normal range of motion.  Skin:    General: Skin is warm and dry.  Neurological:     Mental Status: She is alert.  Psychiatric:        Mood and Affect: Mood normal.        Behavior: Behavior normal.     BP 116/69 (BP Location: Left Arm, Patient Position: Sitting, Cuff Size: Large)   Pulse (!) 101   Temp 98.4 F (36.9 C) (Temporal)   Ht '5\' 5"'$  (1.651 m)   Wt 140 lb (63.5 kg)   SpO2 100%   BMI 23.30 kg/m  Wt Readings from Last 3 Encounters:  09/05/21 140 lb (63.5 kg)  08/08/21 138 lb (62.6 kg)  06/23/21 139 lb (63 kg)        Meds ordered this encounter  Medications   sulfamethoxazole-trimethoprim (BACTRIM DS) 800-160 MG tablet    Sig: Take 1 tablet by mouth 2 (two) times daily.    Dispense:  10 tablet    Refill:  0    Order Specific Question:   Supervising Provider    Answer:   ANDY, CAMILLE L [[id number]]    Jeanie Sewer, NP

## 2021-09-08 ENCOUNTER — Other Ambulatory Visit: Payer: Self-pay | Admitting: Family

## 2021-09-08 DIAGNOSIS — N3090 Cystitis, unspecified without hematuria: Secondary | ICD-10-CM

## 2021-09-08 LAB — URINE CULTURE
MICRO NUMBER:: 13506546
SPECIMEN QUALITY:: ADEQUATE

## 2021-09-08 MED ORDER — DOXYCYCLINE HYCLATE 100 MG PO TABS: 100.0000 mg | ORAL_TABLET | Freq: Two times a day (BID) | ORAL | 0 refills | Status: DC

## 2021-09-08 NOTE — Progress Notes (Signed)
Hi Julia [name],  Your urine culture came back resistant to the Bactrim that I sent. You should stop taking this and I will send Doxycycline instead, start this today.  Hope you are feeling better!

## 2021-09-09 ENCOUNTER — Ambulatory Visit (INDEPENDENT_AMBULATORY_CARE_PROVIDER_SITE_OTHER): Payer: Medicare HMO

## 2021-09-09 DIAGNOSIS — J309 Allergic rhinitis, unspecified: Secondary | ICD-10-CM

## 2021-09-22 ENCOUNTER — Ambulatory Visit
Admission: RE | Admit: 2021-09-22 | Discharge: 2021-09-22 | Disposition: A | Discharge status: Home / Self Care | Payer: Medicare HMO | Source: Ambulatory Visit | Attending: Hematology | Admitting: Hematology

## 2021-09-22 DIAGNOSIS — Z1231 Encounter for screening mammogram for malignant neoplasm of breast: Secondary | ICD-10-CM

## 2021-10-02 ENCOUNTER — Ambulatory Visit: Payer: Medicare HMO | Admitting: Adult Health

## 2021-10-06 ENCOUNTER — Encounter: Payer: Self-pay | Admitting: Adult Health

## 2021-10-06 ENCOUNTER — Ambulatory Visit: Payer: Medicare HMO | Admitting: Adult Health

## 2021-10-06 VITALS — Ht 64.0 in | Wt 254.0 lb

## 2021-10-06 DIAGNOSIS — G20 Parkinson's disease: Secondary | ICD-10-CM

## 2021-10-06 MED ORDER — DULOXETINE HCL 30 MG PO CPEP: 30.0000 mg | ORAL_CAPSULE | Freq: Every day | ORAL | 5 refills | Status: DC

## 2021-10-06 NOTE — Patient Instructions (Signed)
Your Plan:  Continue Siment 1 tab 3 times daily  Continue Mirapex 1 tab before bed, can take additional tablet as needed. If your tremors become more frequent, would recommend taking 1 tablet 2 hours before bedtime then 1 tablet right before bedtime  Start duloxetin '30mg'$  nightly for neuropathy. Plesae let me know after 4 weeks if no benefit or sooner if any difficulty tolerating    Follow up in 4 months with Dr. Rexene Alberts or call earlier if needed     Thank you for coming to see [place] at [facility]. I hope we have been able to provide you high quality care today.  You may receive a patient satisfaction survey over the next few weeks. We would appreciate your feedback and comments so that we may continue to improve ourselves and the health of our patients.      Duloxetine Delayed-Release Capsules What is this medication? DULOXETINE (doo LOX e teen) treats depression, anxiety, fibromyalgia, and certain types of chronic pain such as nerve, bone, or joint pain. It increases the amount of serotonin and norepinephrine in the brain, hormones that help regulate mood and pain. It belongs to a group of medications called SNRIs. This medicine may be used for other purposes; ask your health care provider or pharmacist if you have questions. COMMON BRAND NAME(S): Cymbalta, Drizalma, Irenka What should I tell my care team before I take this medication? They need to know if you have any of these conditions: Bipolar disorder Glaucoma High blood pressure Kidney disease Liver disease Seizures Suicidal thoughts, plans or attempt; a previous suicide attempt by you or a family member Take medications that treat or prevent blood clots Taken medications called MAOIs like Carbex, Eldepryl, Marplan, Nardil, and Parnate within 14 days Trouble passing urine An unusual reaction to duloxetine, other medications, foods, dyes, or preservatives Pregnant or trying to get  pregnant Breast-feeding How should I use this medication? Take this medication by mouth with a glass of water. Follow the directions on the prescription label. Do not crush, cut or chew some capsules of this medication. Some capsules may be opened and sprinkled on applesauce. Check with your care team or pharmacist if you are not sure. You can take this medication with or without food. Take your medication at regular intervals. Do not take your medication more often than directed. Do not stop taking this medication suddenly except upon the advice of your care team. Stopping this medication too quickly may cause serious side effects or your condition may worsen. A special MedGuide will be given to you by the pharmacist with each prescription and refill. Be sure to read this information carefully each time. Talk to your care team regarding the use of this medication in children. While this medication may be prescribed for children as young as 71 years of age for selected conditions, precautions do apply. Overdosage: If you think you have taken too much of this medicine contact a poison control center or emergency room at once. NOTE: This medicine is only for you. Do not share this medicine with others. What if I miss a dose? If you miss a dose, take it as soon as you can. If it is almost time for your next dose, take only that dose. Do not take double or extra doses. What may interact with this medication? Do not take this medication with any of the following: Desvenlafaxine Levomilnacipran Linezolid MAOIs like Carbex, Eldepryl, Emsam, Marplan, Nardil, and Parnate Methylene blue (injected into a vein)  Milnacipran Safinamide Thioridazine Venlafaxine Viloxazine This medication may also interact with the following: Alcohol Amphetamines Aspirin and aspirin-like medications Certain antibiotics like ciprofloxacin and enoxacin Certain medications for blood pressure, heart disease, irregular heart  beat Certain medications for depression, anxiety, or psychotic disturbances Certain medications for migraine headache like almotriptan, eletriptan, frovatriptan, naratriptan, rizatriptan, sumatriptan, zolmitriptan Certain medications that treat or prevent blood clots like warfarin, enoxaparin, and dalteparin Cimetidine Fentanyl Lithium NSAIDS, medications for pain and inflammation, like ibuprofen or naproxen Phentermine Procarbazine Rasagiline Sibutramine St. John's wort Theophylline Tramadol Tryptophan This list may not describe all possible interactions. Give your health care provider a list of all the medicines, herbs, non-prescription drugs, or dietary supplements you use. Also tell them if you smoke, drink alcohol, or use illegal drugs. Some items may interact with your medicine. What should I watch for while using this medication? Tell your care team if your symptoms do not get better or if they get worse. Visit your care team for regular checks on your progress. Because it may take several weeks to see the full effects of this medication, it is important to continue your treatment as prescribed by your care team. This medication may cause serious skin reactions. They can happen weeks to months after starting the medication. Contact your care team right away if you notice fevers or flu-like symptoms with a rash. The rash may be red or purple and then turn into blisters or peeling of the skin. Or, you might notice a red rash with swelling of the face, lips, or lymph nodes in your neck or under your arms. Watch for new or worsening thoughts of suicide or depression. This includes sudden changes in mood, behaviors, or thoughts. These changes can happen at any time but are more common in the beginning of treatment or after a change in dose. Call your care team right away if you experience these thoughts or worsening depression. Manic episodes may happen in patients with bipolar disorder who  take this medication. Watch for changes in feelings or behaviors such as feeling anxious, nervous, agitated, panicky, irritable, hostile, aggressive, impulsive, severely restless, overly excited and hyperactive, or trouble sleeping. These symptoms can happen at any time, but are more common in the beginning of treatment or after a change in dose. Call your care team right away if you notice any of these symptoms. You may get drowsy or dizzy. Do not drive, use machinery, or do anything that needs mental alertness until you know how this medication affects you. Do not stand or sit up quickly, especially if you are an older patient. This reduces the risk of dizzy or fainting spells. Alcohol may interfere with the effect of this medication. Avoid alcoholic drinks. This medication may increase blood sugar. The risk may be higher in patients who already have diabetes. Ask your care team what you can do to lower your risk of diabetes while taking this medication. This medication can cause an increase in blood pressure. This medication can also cause a sudden drop in your blood pressure, which may make you feel faint and increase the chance of a fall. These effects are most common when you first start the medication or when the dose is increased, or during use of other medications that can cause a sudden drop in blood pressure. Check with your care team for instructions on monitoring your blood pressure while taking this medication. Your mouth may get dry. Chewing sugarless gum or sucking hard candy, and drinking plenty of water,  may help. Contact your care team if the problem does not go away or is severe. What side effects may I notice from receiving this medication? Side effects that you should report to your care team as soon as possible: Allergic reactions--skin rash, itching, hives, swelling of the face, lips, tongue, or throat Bleeding--bloody or black, tar-like stools, red or dark brown urine, vomiting blood  or brown material that looks like coffee grounds, small, red or purple spots on skin, unusual bleeding or bruising Increase in blood pressure Liver injury--right upper belly pain, loss of appetite, nausea, light-colored stool, dark yellow or brown urine, yellowing skin or eyes, unusual weakness or fatigue Low sodium level--muscle weakness, fatigue, dizziness, headache, confusion Redness, blistering, peeling, or loosening of the skin, including inside the mouth Serotonin syndrome--irritability, confusion, fast or irregular heartbeat, muscle stiffness, twitching muscles, sweating, high fever, seizures, chills, vomiting, diarrhea Sudden eye pain or change in vision such as blurry vision, seeing halos around lights, vision loss Thoughts of suicide or self-harm, worsening mood, feelings of depression Trouble passing urine Side effects that usually do not require medical attention (report to your care team if they continue or are bothersome): Change in sex drive or performance Constipation Diarrhea Dizziness Dry mouth Excessive sweating Loss of appetite Nausea Vomiting This list may not describe all possible side effects. Call your doctor for medical advice about side effects. You may report side effects to FDA at 1-800-FDA-1088. Where should I keep my medication? Keep out of the reach of children and pets. Store at room temperature between 15 and 30 degrees C (59 to 86 degrees F). Get rid of any unused medication after the expiration date. To get rid of medications that are no longer needed or have expired: Take the medication to a medication take-back program. Check with your pharmacy or law enforcement to find a location. If you cannot return the medication, check the label or package insert to see if the medication should be thrown out in the garbage or flushed down the toilet. If you are not sure, ask your care team. If it is safe to put it in the trash, take the medication out of the  container. Mix the medication with cat litter, dirt, coffee grounds, or other unwanted substance. Seal the mixture in a bag or container. Put it in the trash. NOTE: This sheet is a summary. It may not cover all possible information. If you have questions about this medicine, talk to your doctor, pharmacist, or health care provider.  2023 Elsevier/Gold Standard (2020-03-26 00:00:00)

## 2021-10-06 NOTE — Progress Notes (Signed)
[facility] [address] [name] [address] [phone]       OFFICE FOLLOW UP NOTE  [name] Date of Birth:  05/30/1940 Medical Record Number:  [id number]    Primary neurologist: Dr. Rexene [name] Reason for visit: Parkinson's disease    SUBJECTIVE:   CHIEF COMPLAINT:  Chief Complaint  Patient presents with   Follow-up    Pt reports not feeling to bad. She has a list of questions to discuss today. Room 2 alone    HPI:     [name] is a 81 year old right-handed woman with an underlying complex medical history of breast cancer, colon cancer, hyperlipidemia, restless leg syndrome, scoliosis, arthritis, and Parkinson's disease, who presents for follow-up consultation of her parkinsonism.  The patient is unaccompanied today and presents for follow-up.  She was previously followed by Dr. Margette [name] and transferred care to Dr. Rexene [name] with initial visit 06/23/2021.  Her Sinemet CR 25-100 mg strength 1 pill 4 times daily was increased to 50-200 mg strength 1 pill 3 times daily at the time.  She was maintained on pramipexole 0.25 mg strength at bedtime for restless leg syndrome.    Update 10/06/2021 JM: patient returns for 3 month follow up unaccompanied. Overall has been stable since prior visit. Remains Sinemet 50-'200mg'$  ER 1 pill 3 times daily, denies side effects. Continues on Mirapex .'25mg'$  1 tab prior to bedtime, will occasionally awaken with tremors and will usually resolve after extra dose of Mirapex. Was advised by Dr. Rexene [name] to take 1 tab 2 hrs prior to bedtime and additional tab at bedtime but patient wishes to avoid taking higher dose as nighttime awakenings is not frequent.  At times has vivid dreams but this is unchanged. 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  Reports other symptoms such as abdominal bloating, depression/anxiety, and fatigue. Also mentions chronic pain currently being followed by [facility]opedics [facility].  Returns today for reevaluation.      History provided for reference purposes only update 06/23/2021 Dr. Rexene [name]: She reports feeling fairly stable from the Parkinson's standpoint, it helped to increase the levodopa dose.  She is taking 50-200 mg strength ER 1 pill 3 times daily, at 7 AM, 11 AM and 5 PM daily.  She takes pramipexole at bedtime and it helps her tremor throughout the night, she does not notice much in the way of restless leg symptoms but sometimes her trembling is disturbing at night.  She has vivid dreams at times, exacerbated by certain medications such as the Zanaflex.  She says orthopedics through the [facility].  She has recently had a radiofrequency ablation in the lower back on the left side about a week ago and is pending the procedure for the right side next week.  She tries to hydrate well, no recent falls, cognitively feels stable but has had intermittent issues with anxiety and depressive symptoms, nothing sustained and would like to avoid any medication at this time.  For occasional constipation she takes FiberCon or milk of magnesia as needed.  She tries to eat natural fiber including fresh fruit and vegetables.  She finished physical therapy last week.  She fell about a year ago.  She is up-to-date with her eye appointment and goes once a year.  She generally  goes to bed between 10:30 PM and 11 PM.  She lives with her husband.  She has limited her driving.  Symptoms date back to about 10 years ago in 2013 when she started having a tremor in the left hand.   The patient's allergies, current medications, family history, past medical history, past social history, past surgical history and problem list were reviewed and updated as appropriate.     Previously:   11/15/2020 (Dr. Jannifer [name]): <<[name] is a 81 year old right-handed white female with a history of Parkinson's disease and low back pain.  The patient has a lot of arthritic issues, she wakes up in the morning feeling quite stiff but feels much better when she gets up and moves around.  She has restless leg syndrome at night, but she has gained good improvement with Mirapex.  She is on Sinemet CR taking the 25/100 mg tablets, 1 tablet 4 times daily.  She takes her medications at 7 AM, 11 AM, 3 PM, and 7 PM.  She is still noting some wearing off effect about a half an hour prior to the next dose of her medication.  In the middle the night, she will wake up, and her arm tremors will keep her from getting back to sleep.  She has chronic issues with constipation, she takes milk of magnesia, she has been on FiberCon but still has some issues.  In the past, she could not tolerate magnesium supplementation secondary to diarrhea.  The patient is remaining active, she will walk at least a mile a day.  She has noted some emotional instability that she attributes to her medication.  She does have some gait instability, she denies any falls but will stumble on occasion.  She denies any issues of significance with swallowing.  She has had some sciatica type pain down the left leg, she is followed through orthopedic surgery and may end up getting an epidural steroid injection in the near future.  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)     PSH:  Past Surgical  History:  Procedure Laterality Date   ADENOIDECTOMY     BREAST LUMPECTOMY Left    radiation only   BUNIONECTOMY     COLON SURGERY     HEMORRHOID SURGERY     LAPAROSCOPY     NASAL SINUS SURGERY     OOPHORECTOMY     TONSILLECTOMY     TUBAL LIGATION      Social History:  Social History   Socioeconomic History   Marital status: Married    Spouse name: Not on file   Number of children: Not on file   Years of education: 3   Highest education level: Master's degree (e.g., MA, MS, MEng, MEd, MSW, MBA)  Occupational History   Occupation: Retired Pharmacist, hospital   Tobacco Use   Smoking status: Never   Smokeless tobacco: Never  Vaping Use   Vaping Use: Never used  Substance and Sexual Activity  Alcohol use: Yes    Comment: occ   Drug use: Never   Sexual activity: Not on file  Other Topics Concern   Not on file  Social History Narrative   Right handed    Lives with husband    Caffeine 1 cup daily    Social Determinants of Health   Financial Resource Strain: Low Risk  (02/24/2021)   Overall Financial Resource Strain (CARDIA)    Difficulty of Paying Living Expenses: Not hard at all  Food Insecurity: No Food Insecurity (02/24/2021)   Hunger Vital Sign    Worried About Running Out of Food in the Last Year: Never true    Ran Out of Food in the Last Year: Never true  Transportation Needs: No Transportation Needs (02/24/2021)   PRAPARE - Hydrologist (Medical): No    Lack of Transportation (Non-Medical): No  Physical Activity: Sufficiently Active (02/24/2021)   Exercise Vital Sign    Days of Exercise per Week: 7 days    Minutes of Exercise per Session: 40 min  Stress: No Stress Concern Present (02/24/2021)   [place]    Feeling of Stress : Not at all  Social Connections: [place] (02/24/2021)   Social Connection and Isolation Panel [NHANES]    Frequency of Communication  with Friends and Family: More than three times a week    Frequency of Social Gatherings with Friends and Family: More than three times a week    Attends Religious Services: More than 4 times per year    Active Member of [employer] or Organizations: Yes    Attends Archivist Meetings: 1 to 4 times per year    Marital Status: Married  Human resources officer Violence: Not At Risk (02/24/2021)   Humiliation, Afraid, Rape, and Kick questionnaire    Fear of Current or Ex-Partner: No    Emotionally Abused: No    Physically Abused: No    Sexually Abused: No    Family History:  Family History  Problem Relation Age of Onset   Arthritis Mother    Diabetes Mother    Heart disease Mother    Hypertension Mother    Arthritis Father    Cancer Father    Depression Father    Osteoporosis Father    Arthritis Sister    Cancer Sister    Diabetes Sister    Cancer Sister    Cancer Sister    Hyperlipidemia Sister    Cancer Brother    Hyperlipidemia Brother    Parkinson's disease Neg Hx     Medications:   Current Outpatient Medications on File Prior to Visit  Medication Sig Dispense Refill   Acetaminophen (TYLENOL PO) Take by mouth as needed.     Calcium Citrate-Vitamin D 315-250 MG-UNIT TABS Take by mouth.     carbidopa-levodopa (SINEMET CR) 50-200 MG tablet Take 1 tablet by mouth 3 (three) times daily. 270 tablet 3   cycloSPORINE (RESTASIS) 0.05 % ophthalmic emulsion      denosumab (PROLIA) 60 MG/ML SOSY injection Inject 60 mg into the skin every 6 (six) months.     diphenhydrAMINE (BENADRYL) 12.5 MG chewable tablet Chew 12.5 mg by mouth every 6 (six) hours as needed.     EPINEPHrine 0.3 mg/0.3 mL IJ SOAJ injection INJECT INTO THE MIDDLE OF THE OUTER THIGH AND HOLD FOR 3 SECONDS AS NEEDED FOR SEVERE ALLERGIC REACTION THEN CALL 911 IF USED. 2 each 1  fluticasone (FLONASE) 50 MCG/ACT nasal spray PLACE 2 SPRAYS IN EACH NOSTRIL DAILY AS NEEDED FOR ALLERGIES OR RHINITIS 48 g 0   levothyroxine  (SYNTHROID) 88 MCG tablet TAKE ONE TABLET BY MOUTH DAILY FOR BREAKFAST 90 tablet 3   Melatonin 1 MG CAPS Take by mouth as needed.     Multiple Vitamins-Minerals (CENTRUM SILVER PO) Take by mouth.     naproxen sodium (ALEVE) 220 MG tablet Take 220 mg by mouth as needed.     pramipexole (MIRAPEX) 0.25 MG tablet Take 1 tablet (0.25 mg total) by mouth 2 (two) times daily. Take 1 pill 2 hours before bedtime and 1 pill at bedtime. 60 tablet 3   Probiotic Product (PROBIOTIC DAILY PO) Take by mouth.     SALINE NASAL MIST NA Place into the nose.     UNABLE TO FIND Med Name: Allergy shots 1/week     No current facility-administered medications on file prior to visit.    Allergies:   Allergies  Allergen Reactions   Cefdinir    Cholestyramine     GERD   Contrast Media [Iodinated Contrast Media] Diarrhea   Ezetimibe    Meperidine    Nitrofurantoin Diarrhea   Statins    Teriparatide       OBJECTIVE:  Physical Exam  General: well developed, well nourished, very pleasant elderly Caucasian female, seated, in no evident distress Head: head normocephalic and atraumatic.   Neck: supple with no carotid or supraclavicular bruits. Mild nuchal rigidity and forward flexion of neck and upper back Cardiovascular: regular rate and rhythm, no murmurs Musculoskeletal: Scoliosis Skin:  no rash/petichiae Vascular:  Normal pulses all extremities   Neurologic Exam Mental Status: Awake and fully alert. Oriented to place and time. Recent and remote memory intact. Attention span, concentration and fund of knowledge appropriate. Mood and affect appropriate. Mild facial masking.  Mild hypophonia.   Cranial Nerves: Pupils equal, briskly reactive to light. Extraocular movements full without nystagmus. Visual fields full to confrontation. Hearing intact. Facial sensation intact. Face, tongue, palate moves normally and symmetrically.  Motor: Normal bulk and tone. Normal strength in all tested extremity muscles.   Mild dyskinesias.  Unable to appreciate resting tremor. Unable to appreciate intention tremor. Sensory.: intact to touch , pinprick , position and vibratory sensation.  Coordination: Mild to moderate difficulty with hand movements more so on the left and bilateral foot tapping. Finger-to-nose and heel-to-shin performed accurately bilaterally. Gait and Station: Arises from chair with mild difficulty. Stance is hunched with upper back curvature.  Slow cautious steps with decreased left arm swing. No use of AD.  Tandem walk and heel toe not attempted. Reflexes: 1+ and symmetric. Toes downgoing.      ASSESSMENT/PLAN: Julia [name] is a 81 y.o. year old female with Parkinson's disease with left-sided predominance.    -Continue Sinemet ER 50-'200mg'$  1 tab 3 times daily -Continue Mirapex 0.25 mg nightly at bedtime and extra tablet as needed. Did discuss if she starts to experience increased frequency of tremors that wake her from sleep, she should start to take 1 tablet 2 hrs prior to bedtime and 1 tab at bedtime.  She verbalized understanding -Initiate duloxetine 30 mg daily for neuropathy. Advised this may also be beneficial in regards to her depression/anxiety.  Discussed potential side effects with additional information provided via AVS.  She advised to call after 4 weeks if no benefit or sooner if any difficulty tolerating.  Prior intolerance to gabapentin.      Follow  up in 4 months with Dr. Rexene [name] for further medication management/evaluation or call earlier if needed    CC:  PCP: Leamon Arnt, MD    I spent 38 minutes of face-to-face and non-face-to-face time with patient.  This included previsit chart review, lab review, study review, order entry, electronic health record documentation, patient education regarding Parkinson's disease with multiple questions answered and prolonged discussion regarding current treatment plan and potential future treatment plan and answered all other  questions to patient satisfaction   Frann Rider, [facility]  [facility] [address] [place], [place] [address]  Phone [phone] Fax [phone] Note: This document was prepared with digital dictation and possible smart phrase technology. Any transcriptional errors that result from this process are unintentional.

## 2021-10-07 ENCOUNTER — Ambulatory Visit (INDEPENDENT_AMBULATORY_CARE_PROVIDER_SITE_OTHER): Payer: Medicare HMO

## 2021-10-07 DIAGNOSIS — J309 Allergic rhinitis, unspecified: Secondary | ICD-10-CM | POA: Diagnosis not present

## 2021-10-14 ENCOUNTER — Ambulatory Visit (INDEPENDENT_AMBULATORY_CARE_PROVIDER_SITE_OTHER): Payer: Medicare HMO

## 2021-10-14 DIAGNOSIS — J309 Allergic rhinitis, unspecified: Secondary | ICD-10-CM

## 2021-10-21 ENCOUNTER — Ambulatory Visit (INDEPENDENT_AMBULATORY_CARE_PROVIDER_SITE_OTHER): Payer: Medicare HMO

## 2021-10-21 DIAGNOSIS — J309 Allergic rhinitis, unspecified: Secondary | ICD-10-CM | POA: Diagnosis not present

## 2021-10-22 NOTE — Telephone Encounter (Signed)
error 

## 2021-10-23 ENCOUNTER — Ambulatory Visit (INDEPENDENT_AMBULATORY_CARE_PROVIDER_SITE_OTHER): Payer: Medicare HMO | Admitting: Family

## 2021-10-23 ENCOUNTER — Ambulatory Visit: Payer: Medicare HMO | Admitting: Family

## 2021-10-23 ENCOUNTER — Telehealth: Payer: Self-pay | Admitting: Family

## 2021-10-23 ENCOUNTER — Encounter: Payer: Self-pay | Admitting: Family

## 2021-10-23 VITALS — BP 137/72 | HR 81 | Temp 97.4°F | Ht 64.0 in | Wt 139.1 lb

## 2021-10-23 DIAGNOSIS — N3090 Cystitis, unspecified without hematuria: Secondary | ICD-10-CM | POA: Diagnosis not present

## 2021-10-23 LAB — POCT URINALYSIS DIPSTICK
Bilirubin, UA: NEGATIVE
Blood, UA: POSITIVE
Glucose, UA: NEGATIVE
Ketones, UA: POSITIVE
Nitrite, UA: POSITIVE
Protein, UA: POSITIVE — AB
Spec Grav, UA: 1.02 (ref 1.010–1.025)
Urobilinogen, UA: 0.2 E.U./dL
pH, UA: 5.5 (ref 5.0–8.0)

## 2021-10-23 MED ORDER — CEPHALEXIN 500 MG PO CAPS: 500.0000 mg | ORAL_CAPSULE | Freq: Two times a day (BID) | ORAL | 0 refills | Status: DC

## 2021-10-23 NOTE — Progress Notes (Signed)
Patient ID: Julia [name], female    DOB: [birth year], 81 y.o.   MRN: [id number]  Chief Complaint  Patient presents with   Dysuria    Pt c/o dysuria , urgency and urinary frequency since this morning. At nigh she is going to the restroom a lot. Has not tried anything for symtpoms. Pt was not able to give a urine yet.     HPI: Urinary symptoms: Patient c/o  dysuria, frequency, urgency. Denies: flank pain, pelvic pain, low back pain, foul odor, cloudy urine, hematuria. Other sx: vaginal d/c: none, vaginal itching: none. Duration of sx: 1 day; Home tx: none; Denies  nausea, fever. Reports last UTI a month ago.    Assessment & Plan:  1. Cystitis Last UTI in a month ago, treated with Bactrim, switched to DOXY based on culture results & resolved, now having sx again d/t recent bout of diarrhea, UA positive. Sending Keflex,  advised on med use & SE, proper daily water hydration, urinary hygiene. Also suggested taking a probiotic for diarrhea prevention daily while taking antibiotic.  - POCT Urinalysis Dipstick - Urine Culture - cephALEXin (KEFLEX) 500 MG capsule; Take 1 capsule (500 mg total) by mouth 2 (two) times daily.  Dispense: 10 capsule; Refill: 0   Subjective:    Outpatient Medications Prior to Visit  Medication Sig Dispense Refill   Acetaminophen (TYLENOL PO) Take by mouth as needed.     Calcium Citrate-Vitamin D 315-250 MG-UNIT TABS Take by mouth.     carbidopa-levodopa (SINEMET CR) 50-200 MG tablet Take 1 tablet by mouth 3 (three) times daily. 270 tablet 3   cycloSPORINE (RESTASIS) 0.05 % ophthalmic emulsion      denosumab (PROLIA) 60 MG/ML SOSY injection Inject 60 mg into the skin every 6 (six) months.     diphenhydrAMINE (BENADRYL) 12.5 MG chewable tablet Chew 12.5 mg by mouth every 6 (six) hours as needed.     DULoxetine (CYMBALTA) 30 MG capsule Take 1 capsule (30 mg total) by mouth daily. 30 capsule 5   EPINEPHrine 0.3 mg/0.3 mL IJ SOAJ injection INJECT INTO THE MIDDLE OF THE  OUTER THIGH AND HOLD FOR 3 SECONDS AS NEEDED FOR SEVERE ALLERGIC REACTION THEN CALL 911 IF USED. 2 each 1   fluticasone (FLONASE) 50 MCG/ACT nasal spray PLACE 2 SPRAYS IN EACH NOSTRIL DAILY AS NEEDED FOR ALLERGIES OR RHINITIS 48 g 0   levothyroxine (SYNTHROID) 88 MCG tablet TAKE ONE TABLET BY MOUTH DAILY FOR BREAKFAST 90 tablet 3   Melatonin 1 MG CAPS Take by mouth as needed.     Multiple Vitamins-Minerals (CENTRUM SILVER PO) Take by mouth.     naproxen sodium (ALEVE) 220 MG tablet Take 220 mg by mouth as needed.     pramipexole (MIRAPEX) 0.25 MG tablet Take 1 tablet (0.25 mg total) by mouth 2 (two) times daily. Take 1 pill 2 hours before bedtime and 1 pill at bedtime. 60 tablet 3   Probiotic Product (PROBIOTIC DAILY PO) Take by mouth.     SALINE NASAL MIST NA Place into the nose.     UNABLE TO FIND Med Name: Allergy shots 1/week     No facility-administered medications prior to visit.   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  Constitutional:      Appearance: Normal appearance.  Cardiovascular:     Rate and Rhythm: Normal  rate and regular rhythm.  Pulmonary:     Effort: Pulmonary effort is normal.     Breath sounds: Normal breath sounds.  Musculoskeletal:        General: Normal range of motion.  Skin:    General: Skin is warm and dry.  Neurological:     Mental Status: She is alert.  Psychiatric:        Mood and Affect: Mood normal.        Behavior: Behavior normal.   BP 137/72 (BP Location: Left Arm, Patient Position: Sitting, Cuff Size: Large)   Pulse 81   Temp (!) 97.4 F (36.3 C) (Temporal)   Ht '5\' 4"'$  (1.626 m)   Wt 139 lb 2 oz (63.1 kg)   SpO2 99%   BMI 23.88 kg/m  Wt Readings from Last 3 Encounters:  10/23/21 139 lb 2 oz (63.1 kg)  10/06/21 254 lb (115.2 kg)  09/05/21 140 lb (63.5 kg)       Jeanie Sewer, NP

## 2021-10-23 NOTE — Telephone Encounter (Signed)
Patient requests to be called at ph# [phone] regarding Patient requests to be advised if she can take an antibiotic after having steroid injections in left and right lumbar Patient made appointment for 10:40 am this morning with [facility].

## 2021-10-23 NOTE — Patient Instructions (Signed)
It was very nice to see you today!   An antibiotic has been sent to your pharmacy, start today. We will call or notify you via MyChart if the urine culture indicates a different antibiotic to be used. Drink at least 2 liters = 64oz = 8 cups of water daily!     PLEASE NOTE:  If you had any lab tests please let us know if you have not heard back within a few days. You may see your results on MyChart before we have a chance to review them but we will give you a call once they are reviewed by [place]. If we ordered any referrals today, please let us know if you have not heard from their office within the next week.

## 2021-10-25 LAB — URINE CULTURE
MICRO NUMBER:: 13702987
SPECIMEN QUALITY:: ADEQUATE

## 2021-10-26 NOTE — Progress Notes (Signed)
Hi Julia [name],  Your urine culture is positive and indicates that you are taking the correct antibiotic to get rid of your bladder infection.  Hope you are feeling better!

## 2021-10-28 ENCOUNTER — Ambulatory Visit (INDEPENDENT_AMBULATORY_CARE_PROVIDER_SITE_OTHER): Payer: Medicare HMO

## 2021-10-28 DIAGNOSIS — J309 Allergic rhinitis, unspecified: Secondary | ICD-10-CM | POA: Diagnosis not present

## 2021-10-28 NOTE — Telephone Encounter (Signed)
Tried to call pt no no answer so I sent a MyChart message to let her know that per Dr. Jonni Sanger she can take antibiotics after a steroid injection

## 2021-10-31 ENCOUNTER — Other Ambulatory Visit: Payer: Self-pay | Admitting: Adult Health

## 2021-11-04 ENCOUNTER — Ambulatory Visit (INDEPENDENT_AMBULATORY_CARE_PROVIDER_SITE_OTHER): Payer: Medicare HMO

## 2021-11-04 DIAGNOSIS — J309 Allergic rhinitis, unspecified: Secondary | ICD-10-CM

## 2021-12-02 ENCOUNTER — Ambulatory Visit (INDEPENDENT_AMBULATORY_CARE_PROVIDER_SITE_OTHER): Payer: Medicare HMO

## 2021-12-02 DIAGNOSIS — J309 Allergic rhinitis, unspecified: Secondary | ICD-10-CM | POA: Diagnosis not present

## 2021-12-25 ENCOUNTER — Ambulatory Visit (INDEPENDENT_AMBULATORY_CARE_PROVIDER_SITE_OTHER): Payer: Medicare HMO

## 2021-12-25 DIAGNOSIS — J309 Allergic rhinitis, unspecified: Secondary | ICD-10-CM | POA: Diagnosis not present

## 2022-01-07 NOTE — Telephone Encounter (Signed)
Prolia VOB initiated via [email or web address]  Last Prolia inj 08/08/21 Next Prolia inj due 02/09/22   PA# [id number] Valid: 09/10/20-09/10/21

## 2022-01-12 ENCOUNTER — Encounter: Payer: Self-pay | Admitting: Family Medicine

## 2022-01-12 ENCOUNTER — Ambulatory Visit (INDEPENDENT_AMBULATORY_CARE_PROVIDER_SITE_OTHER): Payer: Medicare HMO | Admitting: Family Medicine

## 2022-01-12 VITALS — BP 110/60 | HR 102 | Temp 98.2°F | Ht 64.0 in | Wt 138.6 lb

## 2022-01-12 DIAGNOSIS — S0511XD Contusion of eyeball and orbital tissues, right eye, subsequent encounter: Secondary | ICD-10-CM

## 2022-01-12 DIAGNOSIS — W19XXXD Unspecified fall, subsequent encounter: Secondary | ICD-10-CM

## 2022-01-12 DIAGNOSIS — S0181XD Laceration without foreign body of other part of head, subsequent encounter: Secondary | ICD-10-CM

## 2022-01-12 DIAGNOSIS — N3000 Acute cystitis without hematuria: Secondary | ICD-10-CM

## 2022-01-12 DIAGNOSIS — S01111D Laceration without foreign body of right eyelid and periocular area, subsequent encounter: Secondary | ICD-10-CM

## 2022-01-12 DIAGNOSIS — Z4802 Encounter for removal of sutures: Secondary | ICD-10-CM | POA: Diagnosis not present

## 2022-01-12 NOTE — Progress Notes (Signed)
Subjective  CC:  Chief Complaint  Patient presents with   Hospitalization Follow-up    Suture removal. Pt stated that she tripped over the curve going into the [facility]    HPI: Julia [name] is a 81 y.o. female who presents to the office today to address the problems listed above in the chief complaint. Reviewed ER and urgent care records: had UTI treated with septra and then fell 01/05/2022 and landed on face on concrete: glasses cut her forehead. Had neg brain CT, orbit CT and spine CT. 2 sutures placed and healing well now. No vision problems, headaches, cognitive problems or other injuries. UTI sxs have resolved. No LOC.  Septra: AE: gives diarrhea.  Assessment  1. Laceration of eyebrow and forehead, right, subsequent encounter   2. Contusion of right orbit, subsequent encounter   3. Visit for suture removal   4. Acute cystitis without hematuria   5. Fall, subsequent encounter      Plan  Fall with lac and contusions:  fortunately neg thorough evaluation in ER and no other injuries or concussion. Removed sutures in routine fashion. Well healed lac. Updated tdap given in ER.  UTI: resolved. Will avoid septra in the future.    Follow up: as scheduled  02/24/2022  No orders of the defined types were placed in this encounter.  No orders of the defined types were placed in this encounter.     I reviewed the patients updated PMH, FH, and SocHx.    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    Priority: Medium    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    Priority: Medium    Anxiety 09/29/2017    Priority: Medium    Dysfunctional voiding of urine 09/13/2016    Priority: Medium    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    Priority: Medium    Gastroesophageal reflux disease 09/13/2016    Priority: Medium    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    Priority: Medium    Irritable bowel syndrome with both constipation and diarrhea 02/13/2015    Priority: Medium    Pelvic floor dysfunction 08/26/2018    Priority: Low   Female bladder prolapse 04/07/2018    Priority: Low   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   Calcium Citrate-Vitamin D 315-250 MG-UNIT TABS Take by mouth.   carbidopa-levodopa (SINEMET CR) 50-200 MG tablet Take 1 tablet by mouth  3 (three) times daily.   cephALEXin (KEFLEX) 500 MG capsule Take 1 capsule (500 mg total) by mouth 2 (two) times daily.   cycloSPORINE (RESTASIS) 0.05 % ophthalmic emulsion    denosumab (PROLIA) 60 MG/ML SOSY injection Inject 60 mg into the skin every 6 (six) months.   diphenhydrAMINE (BENADRYL) 12.5 MG chewable tablet Chew 12.5 mg by mouth every 6 (six) hours as needed.   DULoxetine (CYMBALTA) 30 MG capsule TAKE 1 CAPSULE BY MOUTH EVERY DAY   EPINEPHrine 0.3 mg/0.3 mL IJ SOAJ injection INJECT INTO THE MIDDLE OF THE OUTER THIGH AND HOLD FOR 3 SECONDS AS NEEDED FOR SEVERE ALLERGIC REACTION THEN CALL 911 IF USED.   fluticasone (FLONASE) 50 MCG/ACT  nasal spray PLACE 2 SPRAYS IN EACH NOSTRIL DAILY AS NEEDED FOR ALLERGIES OR RHINITIS   levothyroxine (SYNTHROID) 88 MCG tablet TAKE ONE TABLET BY MOUTH DAILY FOR BREAKFAST   Melatonin 1 MG CAPS Take by mouth as needed.   Multiple Vitamins-Minerals (CENTRUM SILVER PO) Take by mouth.   naproxen sodium (ALEVE) 220 MG tablet Take 220 mg by mouth as needed.   pramipexole (MIRAPEX) 0.25 MG tablet Take 1 tablet (0.25 mg total) by mouth 2 (two) times daily. Take 1 pill 2 hours before bedtime and 1 pill at bedtime.   Probiotic Product (PROBIOTIC DAILY PO) Take by mouth.   SALINE NASAL MIST NA Place into the nose.   UNABLE TO FIND Med Name: Allergy shots 1/week    Allergies: Patient is allergic to cefdinir, cholestyramine, contrast media [iodinated contrast media], ezetimibe, meperidine, nitrofurantoin, septra [sulfamethoxazole-trimethoprim], statins, and teriparatide. Family History: Patient family history includes Arthritis in her father, mother, and sister; Cancer in her brother, father, sister, sister, and sister; Depression in her father; Diabetes in her mother and sister; Heart disease in her mother; Hyperlipidemia in her brother and sister; Hypertension in her mother; Osteoporosis in her father. Social History:  Patient  reports that she has never smoked. She has never used smokeless tobacco. She reports current alcohol use. She reports that she does not use drugs.  Review of Systems: Constitutional: Negative for fever malaise or anorexia Cardiovascular: negative for chest pain Respiratory: negative for SOB or persistent cough Gastrointestinal: negative for abdominal pain  Objective  Vitals: BP 110/60   Pulse (!) 102   Temp 98.2 F (36.8 C)   Ht '5\' 4"'$  (1.626 m)   Wt 138 lb 9.6 oz (62.9 kg)   SpO2 97%   BMI 23.79 kg/m  General: no acute distress , A&Ox3 Normal mental status HEENT: PEERL, conjunctiva normal, neck is supple, ecchymosis around right orbit w/o orbital swelling or  ttp. Above right brow: 1cm healed lac w/ 2 interrupted sutures present  Procedure note: Cleansed lac and removed sutures in routine fashion. Tolerated well.   Commons side effects, risks, benefits, and alternatives for medications and treatment plan prescribed today were discussed, and the patient expressed understanding of the given instructions. Patient is instructed to call or message via MyChart if he/she has any questions or concerns regarding our treatment plan. No barriers to understanding were identified. We discussed Red Flag symptoms and signs in detail. Patient expressed understanding regarding what to do in case of urgent or emergency type symptoms.  Medication list was reconciled, printed and provided to the patient in AVS. Patient instructions and summary information was reviewed with the patient as documented in the AVS. This note was prepared with assistance of Dragon voice recognition software. Occasional wrong-word or sound-a-like substitutions may have occurred  due to the inherent limitations of voice recognition software  This visit occurred during the SARS-CoV-2 public health emergency.  Safety protocols were in place, including screening questions prior to the visit, additional usage of staff PPE, and extensive cleaning of exam room while observing appropriate contact time as indicated for disinfecting solutions.

## 2022-01-18 NOTE — Telephone Encounter (Signed)
Prior auth renewal required for Prolia  PA PROCESS DETAILS: Precertification is required. Call [phone] or complete the Precertification form available at [email or web address]

## 2022-01-22 ENCOUNTER — Ambulatory Visit (INDEPENDENT_AMBULATORY_CARE_PROVIDER_SITE_OTHER): Payer: Medicare HMO

## 2022-01-22 DIAGNOSIS — J309 Allergic rhinitis, unspecified: Secondary | ICD-10-CM

## 2022-01-27 NOTE — Telephone Encounter (Signed)
Submitted a Prior Authorization request to [place] for [facility] via CoverMyMeds. Will update once we receive a response.   Key: [id number] [id number]

## 2022-01-27 NOTE — Telephone Encounter (Signed)
Last Prolia injection 08/08/2021

## 2022-01-28 ENCOUNTER — Other Ambulatory Visit (HOSPITAL_COMMUNITY): Payer: Self-pay

## 2022-01-28 NOTE — Telephone Encounter (Signed)
Received notification from [facility] regarding a prior authorization for Prolia Solution.  Authorization has been APPROVED from 01/27/22 to 01/28/2023.    Approval letter attached to chart.

## 2022-02-10 ENCOUNTER — Encounter: Payer: Self-pay | Admitting: Neurology

## 2022-02-10 ENCOUNTER — Ambulatory Visit (INDEPENDENT_AMBULATORY_CARE_PROVIDER_SITE_OTHER): Payer: Medicare HMO

## 2022-02-10 ENCOUNTER — Encounter: Payer: Medicare HMO | Admitting: Family Medicine

## 2022-02-10 ENCOUNTER — Ambulatory Visit: Payer: Medicare HMO | Admitting: Neurology

## 2022-02-10 VITALS — BP 107/66 | HR 94 | Ht 64.75 in | Wt 139.0 lb

## 2022-02-10 DIAGNOSIS — G20A1 Parkinson's disease without dyskinesia, without mention of fluctuations: Secondary | ICD-10-CM | POA: Diagnosis not present

## 2022-02-10 DIAGNOSIS — Z9181 History of falling: Secondary | ICD-10-CM

## 2022-02-10 DIAGNOSIS — J309 Allergic rhinitis, unspecified: Secondary | ICD-10-CM | POA: Diagnosis not present

## 2022-02-10 DIAGNOSIS — G20B1 Parkinson's disease with dyskinesia, without mention of fluctuations: Secondary | ICD-10-CM

## 2022-02-10 MED ORDER — PRAMIPEXOLE DIHYDROCHLORIDE 0.25 MG PO TABS: 0.2500 mg | ORAL_TABLET | Freq: Two times a day (BID) | ORAL | 3 refills | Status: DC

## 2022-02-10 NOTE — Patient Instructions (Addendum)
As discussed, we will keep your medication for Parkinson's disease the same.  Please continue to try to stay active.  You can discuss with your primary care physician who was taking Cymbalta, it may help your anxiety symptoms but also neuropathy and pain symptoms.  Please take your Sinemet CR which is a long-acting levodopa 3 times a day at 9 AM, 1 PM and 7 PM daily, can have her breakfast at 7 AM, lunch at 11 AM, and supper at 5 PM.   You can continue with low-dose pramipexole 0.25 mg at bedtime and a second optional dose an hour after going to bed if need be.  Please continue to monitor your driving skills. I would suggest at this point only local roads, familiar routes, no nighttime and no highway driving.

## 2022-02-10 NOTE — Progress Notes (Signed)
Subjective:    Patient ID: Julia [name] is a 81 y.o. female.  HPI    Interim history:   [name] is a 81 year old right-handed woman with an underlying complex medical history of breast cancer, colon cancer, hyperlipidemia, restless leg syndrome, scoliosis, arthritis, and Parkinson's disease, who presents for follow-up consultation of her parkinsonism.  The patient is unaccompanied today and presents for follow-up. I first met her on 06/23/2021 , as a transfer of care from Dr. Jannifer [name], at which time she reported feeling stable, she was taking Sinemet long-acting 50-200 mg strength 3 times a day.  She was taking pramipexole at bedtime.  She had occasional constipation.  She saw Julia Rider, NP in the interim on 10/06/2021, at which time she reported occasional vivid dreams.  She reported chronic pain, she was followed by orthopedics.  Today, 02/10/2022: She reports feeling fairly stable but did have a fall unfortunately on 01/05/2022.  She was on vacation in [place] and traveling back they stopped overnight in [place], in [place].  She tripped at a curb and landed on the right side of her face, she needed stitches by her right eyebrow, she reports that she had a CT scan of the head at the time, went to the ER via ambulance.  She has had more anxiety symptoms.  Her pain management doctor provided a prescription for Cymbalta but she never actually took it.  She is afraid of side effects.  She continues to take Sinemet CR 1 pill 3 times a day at 8, 12 and 4.  She eats at 7, 11 and 5.  She is willing to change the timing of her Sinemet CR.  She takes low-dose pramipexole 0.25 mg at bedtime which is between 9:30 PM and 10 PM and a second optional dose an hour later if she has trembling and cannot sleep.   The patient's allergies, current medications, family history, past medical history, past social history, past surgical history and problem list were reviewed and updated as  appropriate.    Previously:   06/23/21: She was previously followed by Dr. Margette [name] and was last seen by Dr. Jannifer [name] on 11/15/2020.  I reviewed the note and copied the note below for reference. Her Sinemet CR 25-100 mg strength 1 pill 4 times daily was increased to 50-200 mg strength 1 pill 3 times daily at the time.  She was maintained on pramipexole 0.25 mg strength at bedtime for restless leg syndrome.     11/15/2020 (Dr. Jannifer [name]): <<Julia [name] is a 81 year old right-handed white female with a history of Parkinson's disease and low back pain.  The patient has a lot of arthritic issues, she wakes up in the morning feeling quite stiff but feels much better when she gets up and moves around.  She has restless leg syndrome at night, but she has gained good improvement with Mirapex.  She is on Sinemet CR taking the 25/100 mg tablets, 1 tablet 4 times daily.  She takes her medications at 7 AM, 11 AM, 3 PM, and 7 PM.  She is still noting some wearing off effect about a half an hour prior to the next dose of her medication.  In the middle the night, she will wake up, and her arm tremors will keep her from getting back to sleep.  She has chronic issues with constipation, she takes milk of magnesia, she has been on FiberCon but still has some issues.  In the past, she could not tolerate magnesium  supplementation secondary to diarrhea.  The patient is remaining active, she will walk at least a mile a day.  She has noted some emotional instability that she attributes to her medication.  She does have some gait instability, she denies any falls but will stumble on occasion.  She denies any issues of significance with swallowing.  She has had some sciatica type pain down the left leg, she is followed through orthopedic surgery and may end up getting an epidural steroid injection in the near future.  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[place])     Her Past Surgical History Is Significant For: Past Surgical History:  Procedure Laterality Date   ADENOIDECTOMY     BREAST LUMPECTOMY Left    radiation only   BUNIONECTOMY     COLON SURGERY     HEMORRHOID SURGERY     LAPAROSCOPY     NASAL SINUS SURGERY     OOPHORECTOMY     TONSILLECTOMY     TUBAL LIGATION      Her Family History Is Significant For: Family History  Problem Relation Age of Onset   Arthritis Mother    Diabetes Mother    Heart disease Mother    Hypertension Mother    Arthritis Father    Cancer Father    Depression Father    Osteoporosis Father    Arthritis Sister    Cancer Sister    Diabetes Sister    Cancer Sister    Cancer Sister    Hyperlipidemia Sister    Cancer Brother    Hyperlipidemia Brother    Parkinson's disease Neg Hx     Her Social History Is Significant For: Social History   Socioeconomic History   Marital status: Married    Spouse name: Not on file   Number of children: 3   Years of education: masters + 69 credits   Highest education level: Master's degree (e.g., MA, MS, MEng, MEd, MSW, MBA)  Occupational History   Occupation: Retired Pharmacist, hospital   Tobacco Use   Smoking status: Never   Smokeless tobacco: Never  Vaping Use   Vaping Use: Never used  Substance and Sexual Activity   Alcohol use: Yes    Comment: a beer once a month with pizza   Drug use: Never   Sexual activity: Not on file  Other Topics Concern   Not on file  Social History Narrative   Right handed    Lives with husband    Caffeine 2 cups daily    Social Determinants of Health   Financial Resource Strain: Low Risk  (02/24/2021)   Overall Financial Resource  Strain (CARDIA)    Difficulty of Paying Living Expenses: Not hard at all  Food Insecurity: No Food Insecurity (02/24/2021)   Hunger Vital Sign    Worried About Running Out of Food in the Last Year: Never true    Ran Out of Food in the Last Year: Never true  Transportation Needs: No Transportation Needs (02/24/2021)   PRAPARE - Transportation    Lack of  Transportation (Medical): No    Lack of Transportation (Non-Medical): No  Physical Activity: Sufficiently Active (02/24/2021)   Exercise Vital Sign    Days of Exercise per Week: 7 days    Minutes of Exercise per Session: 40 min  Stress: No Stress Concern Present (02/24/2021)   Petal    Feeling of Stress : Not at all  Social Connections: [place] (02/24/2021)   Social Connection and Isolation Panel [NHANES]    Frequency of Communication with Friends and Family: More than three times a week    Frequency of Social Gatherings with Friends and Family: More than three times a week    Attends Religious Services: More than 4 times per year    Active Member of [employer] or Organizations: Yes    Attends Archivist Meetings: 1 to 4 times per year    Marital Status: Married    Her Allergies Are:  Allergies  Allergen Reactions   Cefdinir    Cholestyramine     GERD   Contrast Media [Iodinated Contrast Media] Diarrhea   Ezetimibe    Meperidine    Nitrofurantoin Diarrhea   Septra [Sulfamethoxazole-Trimethoprim] Diarrhea   Statins    Teriparatide   :   Her Current Medications Are:  Outpatient Encounter Medications as of 02/10/2022  Medication Sig   Acetaminophen (TYLENOL PO) Take by mouth as needed.   Calcium Citrate-Vitamin D 315-250 MG-UNIT TABS Take by mouth.   carbidopa-levodopa (SINEMET CR) 50-200 MG tablet Take 1 tablet by mouth 3 (three) times daily.   denosumab (PROLIA) 60 MG/ML SOSY injection Inject 60 mg into the skin every 6 (six) months.    diphenhydrAMINE (BENADRYL) 12.5 MG chewable tablet Chew 12.5 mg by mouth every 6 (six) hours as needed.   EPINEPHrine 0.3 mg/0.3 mL IJ SOAJ injection INJECT INTO THE MIDDLE OF THE OUTER THIGH AND HOLD FOR 3 SECONDS AS NEEDED FOR SEVERE ALLERGIC REACTION THEN CALL 911 IF USED.   fluticasone (FLONASE) 50 MCG/ACT nasal spray PLACE 2 SPRAYS IN EACH NOSTRIL DAILY AS NEEDED FOR ALLERGIES OR RHINITIS   levothyroxine (SYNTHROID) 88 MCG tablet TAKE ONE TABLET BY MOUTH DAILY FOR BREAKFAST   Melatonin 1 MG CAPS Take by mouth as needed.   Multiple Vitamins-Minerals (CENTRUM SILVER PO) Take by mouth.   naproxen sodium (ALEVE) 220 MG tablet Take 220 mg by mouth as needed.   pramipexole (MIRAPEX) 0.25 MG tablet Take 1 tablet (0.25 mg total) by mouth 2 (two) times daily. Take 1 pill 2 hours before bedtime and 1 pill at bedtime.   Probiotic Product (PROBIOTIC DAILY PO) Take by mouth.   SALINE NASAL MIST NA Place into the nose.   UNABLE TO FIND Med Name: Allergy shots every 4 weeks   cephALEXin (KEFLEX) 500 MG capsule Take 1 capsule (500 mg total) by mouth 2 (two) times daily. (Patient not taking: Reported on 02/10/2022)   DULoxetine (CYMBALTA) 30 MG capsule TAKE 1 CAPSULE BY MOUTH EVERY DAY (Patient not taking: Reported on 02/10/2022)   [DISCONTINUED] cycloSPORINE (RESTASIS) 0.05 % ophthalmic emulsion  (Patient not taking: Reported on 02/10/2022)   No facility-administered encounter medications on file as of 02/10/2022.  :  Review of Systems:  Out of a complete 14 point review of systems, all are reviewed and negative with the exception of these symptoms as listed below:  Review of Systems  Neurological:        Patient is here alone for a 4 month  Parkinson's follow-up. She feels some days are better than others. Patient states she did not start the Duloxetine because she could not take it as needed. She would like to discuss some concerns involving feeling anxious and stressed. She wonders if its the  medication that is causing it because she has never had these symptoms before. She also is finding that she needs naps and she never used to take them. She also states she is having issues with neuropathy in her feet, intermittent pain and difficulty sleeping. She states her vision has changed. She has an eye doctor she sees. She fell on October 9th. She tripped on a curb and landed on cement. She had stitches placed under her R eyebrow.     Objective:  Neurological Exam  Physical Exam Physical Examination:   Vitals:   02/10/22 1318  BP: 107/66  Pulse: 94    General Examination: The patient is a very pleasant 81 y.o. female in no acute distress. She appears well-developed and well-nourished and well groomed.   HEENT: Normocephalic, atraumatic, well-healed scar right underneath her right lateral eyebrow.  Pupils are equal, round and reactive to light, corrective eyeglasses in place, hearing is grossly intact, face is symmetric with mild to moderate facial masking and decreased eye blink rate.  Speech with mild hypophonia, no dysarthria, she has moderate nuchal rigidity and forward flexion of the neck and upper back.  Shoulder is higher than right.  She has limitation in neck mobility.  Airway examination reveals mild mouth dryness, small mouth opening, slight nose deformity, stable.     Chest: Clear to auscultation without wheezing, rhonchi or crackles noted.   Heart: S1+S2+0, regular and normal without murmurs, rubs or gallops noted.    Abdomen: Soft, non-tender and non-distended.   Extremities: There is no pitting edema in the distal lower extremities bilaterally.    Skin: Warm and dry without trophic changes noted.    Musculoskeletal: exam reveals scoliosis and increase in upper back curvature, left shoulder higher than right, seems stable.      Neurologically:  Mental status: The patient is awake, alert and oriented in all 4 spheres. Her immediate and remote memory, attention,  language skills and fund of knowledge are appropriate. There is no evidence of aphasia, agnosia, apraxia or anomia. Mood is normal and affect is normal.  Cranial nerves II - XII are as described above under HEENT exam.  Motor exam: Normal bulk, and strength for age, mild increase in tone, mild intermittent generalized dyskinesias, no obvious resting tremor.  Romberg is not tested for safety reasons.  Fine motor and coordination: She has mild to moderate difficulty with fine motor skills on the right and moderately so on the left.   She stands without difficulty but does push herself up.  Posture is moderately stooped with upper back curvature and scoliosis.  She walks with great caution, no walking aid, decreased arm swing on the left.  Cerebellar testing: No dysmetria or intention tremor. There is no truncal or gait ataxia.  Sensory exam: intact to light touch in the upper and lower extremities.    Assessment and Plan:  In summary, YUMNA EBERS is a very pleasant 81 year old female with an underlying complex medical history of breast cancer, colon cancer, hyperlipidemia, restless leg syndrome, scoliosis, arthritis, and Parkinson's disease, who presents for follow-up consultation of her parkinsonism with left-sided predominance.  She is doing fairly well at this time from the motor standpoint with extended release levodopa 50-200 mg  strength 1 pill 3 times daily.  She also takes low-dose pramipexole at bedtime which she feels is more beneficial for tremors and restless leg symptoms at this time.  She is advised to take her levodopa at 9 AM, 1 PM and 7 PM daily so her dosing is away from her mealtimes.  She is advised to talk to her primary care about her anxiety symptoms.  She has not started the Cymbalta which was prescribed for her.  She is encouraged to talk to her primary care about her opinion on the Cymbalta.  She is advised to continue to stay active.  We talked about her driving.  She is advised to  monitor her driving skills and limit herself to local roads of familiar routes, no nighttime or highway driving at this time.  Unfortunately, she fell in October and needed stitches to the forehead/eyebrow area on the right.  She is advised to follow-up to see one of our nurse practitioners in 6 months, sooner if needed.  I answered all her questions today and she was in agreement.    I spent 30 minutes in total face-to-face time and in reviewing records during pre-charting, more than 50% of which was spent in counseling and coordination of care, reviewing test results, reviewing medications and treatment regimen and/or in discussing or reviewing the diagnosis of PD, the prognosis and treatment options. Pertinent laboratory and imaging test results that were available during this visit with the patient were reviewed by me and considered in my medical decision making (see chart for details).

## 2022-02-11 DIAGNOSIS — J3089 Other allergic rhinitis: Secondary | ICD-10-CM

## 2022-02-11 NOTE — Progress Notes (Signed)
VIALS EXP 02-12-23

## 2022-02-12 DIAGNOSIS — J302 Other seasonal allergic rhinitis: Secondary | ICD-10-CM

## 2022-02-14 DIAGNOSIS — L814 Other melanin hyperpigmentation: Secondary | ICD-10-CM | POA: Insufficient documentation

## 2022-02-14 DIAGNOSIS — L578 Other skin changes due to chronic exposure to nonionizing radiation: Secondary | ICD-10-CM | POA: Insufficient documentation

## 2022-02-14 DIAGNOSIS — D2271 Melanocytic nevi of right lower limb, including hip: Secondary | ICD-10-CM | POA: Insufficient documentation

## 2022-02-14 DIAGNOSIS — D1801 Hemangioma of skin and subcutaneous tissue: Secondary | ICD-10-CM | POA: Insufficient documentation

## 2022-02-14 DIAGNOSIS — D225 Melanocytic nevi of trunk: Secondary | ICD-10-CM | POA: Insufficient documentation

## 2022-02-24 ENCOUNTER — Encounter: Payer: Medicare HMO | Admitting: Family Medicine

## 2022-03-03 ENCOUNTER — Encounter: Payer: Self-pay | Admitting: Family Medicine

## 2022-03-03 ENCOUNTER — Ambulatory Visit (INDEPENDENT_AMBULATORY_CARE_PROVIDER_SITE_OTHER): Payer: Medicare HMO | Admitting: Family Medicine

## 2022-03-03 VITALS — BP 126/72 | HR 83 | Temp 98.1°F | Ht 64.75 in | Wt 138.6 lb

## 2022-03-03 DIAGNOSIS — E063 Autoimmune thyroiditis: Secondary | ICD-10-CM | POA: Diagnosis not present

## 2022-03-03 DIAGNOSIS — Z0000 Encounter for general adult medical examination without abnormal findings: Secondary | ICD-10-CM

## 2022-03-03 DIAGNOSIS — R351 Nocturia: Secondary | ICD-10-CM

## 2022-03-03 DIAGNOSIS — G2581 Restless legs syndrome: Secondary | ICD-10-CM | POA: Diagnosis not present

## 2022-03-03 DIAGNOSIS — Z853 Personal history of malignant neoplasm of breast: Secondary | ICD-10-CM

## 2022-03-03 DIAGNOSIS — R7301 Impaired fasting glucose: Secondary | ICD-10-CM

## 2022-03-03 DIAGNOSIS — E038 Other specified hypothyroidism: Secondary | ICD-10-CM | POA: Diagnosis not present

## 2022-03-03 DIAGNOSIS — M810 Age-related osteoporosis without current pathological fracture: Secondary | ICD-10-CM

## 2022-03-03 DIAGNOSIS — J8410 Pulmonary fibrosis, unspecified: Secondary | ICD-10-CM

## 2022-03-03 DIAGNOSIS — G620 Drug-induced polyneuropathy: Secondary | ICD-10-CM

## 2022-03-03 DIAGNOSIS — G20A2 Parkinson's disease without dyskinesia, with fluctuations: Secondary | ICD-10-CM

## 2022-03-03 LAB — COMPREHENSIVE METABOLIC PANEL
ALT: 4 U/L (ref 0–35)
AST: 15 U/L (ref 0–37)
Albumin: 4.4 g/dL (ref 3.5–5.2)
Alkaline Phosphatase: 48 U/L (ref 39–117)
BUN: 17 mg/dL (ref 6–23)
CO2: 27 mEq/L (ref 19–32)
Calcium: 9.5 mg/dL (ref 8.4–10.5)
Chloride: 99 mEq/L (ref 96–112)
Creatinine, Ser: 0.92 mg/dL (ref 0.40–1.20)
GFR: 59.22 mL/min — ABNORMAL LOW (ref >60.00)
Glucose, Bld: 103 mg/dL — ABNORMAL HIGH (ref 70–99)
Potassium: 4.4 mEq/L (ref 3.5–5.1)
Sodium: 135 mEq/L (ref 135–145)
Total Bilirubin: 0.6 mg/dL (ref 0.2–1.2)
Total Protein: 6.8 g/dL (ref 6.0–8.3)

## 2022-03-03 LAB — CBC WITH DIFFERENTIAL/PLATELET
Basophils Absolute: 0 10*3/uL (ref 0.0–0.1)
Basophils Relative: 0.7 % (ref 0.0–3.0)
Eosinophils Absolute: 0.1 10*3/uL (ref 0.0–0.7)
Eosinophils Relative: 1.8 % (ref 0.0–5.0)
HCT: 41.2 % (ref 36.0–46.0)
Hemoglobin: 14 g/dL (ref 12.0–15.0)
Lymphocytes Relative: 15.2 % (ref 12.0–46.0)
Lymphs Abs: 0.9 10*3/uL (ref 0.7–4.0)
MCHC: 33.9 g/dL (ref 30.0–36.0)
MCV: 91.5 fl (ref 78.0–100.0)
Monocytes Absolute: 0.5 10*3/uL (ref 0.1–1.0)
Monocytes Relative: 8.8 % (ref 3.0–12.0)
Neutro Abs: 4.4 10*3/uL (ref 1.4–7.7)
Neutrophils Relative %: 73.5 % (ref 43.0–77.0)
Platelets: 255 10*3/uL (ref 150.0–400.0)
RBC: 4.5 Mil/uL (ref 3.87–5.11)
RDW: 14.3 % (ref 11.5–15.5)
WBC: 6 10*3/uL (ref 4.0–10.5)

## 2022-03-03 LAB — HEMOGLOBIN A1C: Hgb A1c MFr Bld: 6.5 % (ref 4.6–6.5)

## 2022-03-03 LAB — TSH: TSH: 1.42 u[IU]/mL (ref 0.35–5.50)

## 2022-03-03 NOTE — Progress Notes (Signed)
Subjective  Chief Complaint  Patient presents with   Annual Exam    HPI: Julia [name] is a 81 y.o. female who presents to [facility] today for a Female Wellness Visit. She also has the concerns and/or needs as listed above in the chief complaint. These will be addressed in addition to the Health Maintenance Visit.   Wellness Visit: annual visit with health maintenance review and exam without Pap  HM: screens are current. Has questions about driving  Chronic disease f/u and/or acute problem visit: (deemed necessary to be done in addition to the wellness visit): Parkinson's: reviewed recent neurology notes. Adjusting meds. Pt struggles with positive effects of medications vs side effects Due for thyroid recheck. Feels levels are normal. Osteoporosis: due for prolia injection today. Reviewed last dexa  Polyneruopathy and RLS on meds: still symptomatic  Following along with pulm. Respiratory status is stable.  IFG: due for recheck. Denies sxs of hyperglycemia. Anxiety; feels the medications make her more anxious but doesn't feel she needs more medication or more help to manage it. She never started the cymbalta C/o nocturia every hour last night/ w/o dysuria odor or blood in the urine.   Assessment  1. Annual physical exam   2. Hypothyroidism due to Hashimoto's thyroiditis   3. 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 History of breast cancer left, 2016   5. Parkinson's disease with fluctuating manifestations, unspecified whether dyskinesia present   6. Drug-induced polyneuropathy ([place])   7. 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 RLS (restless legs syndrome)   9. Impaired fasting glucose   10. Nocturia      Plan  Female Wellness Visit: Age appropriate Health Maintenance and Prevention measures were discussed with patient. Included topics are cancer screening recommendations, ways to keep healthy (see AVS) including dietary and exercise  recommendations, regular eye and dental care, use of seat belts, and avoidance of moderate alcohol use and tobacco use.  BMI: discussed patient's BMI and encouraged positive lifestyle modifications to help get to or maintain a target BMI. HM needs and immunizations were addressed and ordered. See below for orders. See HM and immunization section for updates. Routine labs and screening tests ordered including cmp, cbc and lipids where appropriate. Discussed recommendations regarding Vit D and calcium supplementation (see AVS)  Chronic disease management visit and/or acute problem visit: Recheck Tsh; on levotx 18mg daily. Clnically euthyroid Prolia injection to treat osteoporosis. Tolerating well Continue sinemet for parkinsons. Discussed cautions to prevent falls. Cautious driving reviewed Neuropathy is stable Anxiety: will follow. She doen'st feel she needs treatment; this is reasonable at ths time.  Check urine culture. Check a1c  Follow up: 6 mo for recheck  Orders Placed This Encounter  Procedures   Urine Culture   CBC with Differential/Platelet   Comprehensive metabolic panel   Hemoglobin A1c   TSH   No orders of the defined types were placed in this encounter.     Body mass index is 23.24 kg/m. Wt Readings from Last 3 Encounters:  03/03/22 138 lb 9.6 oz (62.9 kg)  02/10/22 139 lb (63 kg)  01/12/22 138 lb 9.6 oz (62.9 kg)     Patient Active Problem List   Diagnosis Date Noted   Statin intolerance 10/13/2018    Priority: High    Statin and zetia    Parkinson's disease 09/29/2017    Priority: High    PD L since 2018, anx, cancer with chemotherapy-induced neuropathy at  the hands and feet  2016 B12 and TSH unremarkable    Age-related osteoporosis without current pathological fracture 07/20/2016    Priority: High    Reports took fosamax x 8 years, then forteo x 6 months.  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     Hypothyroidism due to Hashimoto's thyroiditis 02/13/2015    Priority: High   History of breast cancer left, 2016 02/13/2015    Priority: High    Left, 2016; s/p lumpectomy and rads TX and tamoxifen;     Multiple pulmonary nodules 06/20/2019    Priority: Medium     Left; incidental on CT 05/2019; rec repeat in 05/2020 due to h/o primary malignancy. Repeat 04/2020 benign nodules. No metastatic disease or change. Nothing further recommended.    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 See media section    Seasonal and perennial allergic rhinitis 02/01/2018    Priority: Low   Atrophic vaginitis 02/13/2015    Priority: Low   Recurrent UTI 02/13/2015    Priority: Low   Vitamin D deficiency disease 02/13/2015    Priority: [place] Maintenance  Topic Date Due   Medicare Annual Wellness (AWV)  02/24/2022   COVID-19 Vaccine (5 - 2023-24 season) 03/19/2022 (Originally 11/28/2021)   DEXA SCAN   09/21/2022   MAMMOGRAM  09/23/2022   DTaP/Tdap/Td (3 - Td or Tdap) 01/06/2032   Pneumonia Vaccine 49+ Years old  Completed   INFLUENZA VACCINE  Completed   Zoster Vaccines- Shingrix  Completed   HPV VACCINES  Aged Out   Hepatitis C Screening  Discontinued   Immunization History  Administered Date(s) Administered   Fluad Quad(high Dose 65+) 12/16/2018, 01/01/2020, 01/01/2021, 12/25/2021   Influenza, High Dose Seasonal PF 01/05/2015   Influenza,inj,Quad PF,6+ Mos 01/10/2018   Moderna Sars-Covid-2 Vaccination 04/10/2019, 05/08/2019   PFIZER(Purple Top)SARS-COV-2 Vaccination 02/02/2020   Pfizer Covid-19 Vaccine Bivalent Booster 51yr & up 01/01/2021   Pneumococcal Conjugate-13 01/16/2014   Pneumococcal Polysaccharide-23 12/10/2007, 01/29/2015   Respiratory Syncytial Virus Vaccine,Recomb Aduvanted(Arexvy) 12/16/2021   Td 01/05/2022   Tdap 01/05/2022   Zoster Recombinat (Shingrix) 10/15/2017, 01/12/2018, 02/17/2018   We updated and reviewed the patient's past history in detail and it is documented below. Allergies: Patient is allergic to cefdinir, cholestyramine, contrast  media [iodinated contrast media], ezetimibe, meperidine, nitrofurantoin, septra [sulfamethoxazole-trimethoprim], statins, and teriparatide. 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 Past Surgical History Patient  has a past surgical history that includes Tonsillectomy; Hemorrhoid surgery; laparoscopy; Bunionectomy; Tubal ligation; Colon surgery; Oophorectomy; Nasal sinus surgery; Adenoidectomy; and Breast lumpectomy (Left). Family History: Patient family history includes Arthritis in her father, mother, and sister; Cancer in her  brother, father, sister, sister, and sister; Depression in her father; Diabetes in her mother and sister; Heart disease in her mother; Hyperlipidemia in her brother and sister; Hypertension in her mother; Osteoporosis in her father. Social History:  Patient  reports that she has never smoked. She has never used smokeless tobacco. She reports current alcohol use. She reports that she does not use drugs.  Review of Systems: Constitutional: negative for fever or malaise Ophthalmic: negative for photophobia, double vision or loss of vision Cardiovascular: negative for chest pain, dyspnea on exertion, or new LE swelling Respiratory: negative for SOB or persistent cough Gastrointestinal: negative for abdominal pain, change in bowel habits or melena Genitourinary: negative for dysuria or gross hematuria, no abnormal uterine bleeding or disharge Musculoskeletal: negative for new gait disturbance or muscular weakness Integumentary: negative for new or persistent rashes, no breast lumps Neurological: negative for TIA or stroke symptoms Psychiatric: negative for SI or delusions Allergic/Immunologic: negative for hives  Patient Care Team    Relationship Specialty Notifications Start End  Leamon Arnt, MD PCP - General Family Medicine  01/10/18   Kathrynn Ducking, MD (Inactive) Consulting Physician Neurology  04/12/18   Bobbitt, [place] Muta, MD Consulting Physician Allergy and Immunology  01/17/19   Mansouraty, Telford Nab., MD Consulting Physician Gastroenterology  01/27/19   Truitt Merle, MD Consulting Physician Oncology  03/17/19   Lyndee Hensen, PT Physical Therapist Physical Therapy  08/02/19   Trula Slade, DPM Consulting Physician Podiatry  10/17/19   Star Age, MD Attending Physician Neurology  02/05/21   Edythe Clarity, [facility] Pharmacist Pharmacist  06/17/21    Comment: [phone]  Jeronimo Greaves, MD Referring Physician Family Medicine  10/21/21     Objective  Vitals: BP 126/72    Pulse 83   Temp 98.1 F (36.7 C)   Ht 5' 4.75" (1.645 m)   Wt 138 lb 9.6 oz (62.9 kg)   SpO2 91%   BMI 23.24 kg/m  General:  Well developed, well nourished, no acute distress  Psych:  Alert and orientedx3,normal mood and affect HEENT:  Normocephalic, atraumatic, non-icteric sclera,  supple neck without adenopathy, mass or thyromegaly Cardiovascular:  Normal S1, S2, RRR without gallop, rub or murmur Respiratory:  Good breath sounds bilaterally, CTAB with normal respiratory effort Gastrointestinal: normal bowel sounds, soft, non-tender, no noted masses. No HSM MSK: no deformities, contusions. Joints are without erythema or swelling.  Skin:  Warm, no rashes or suspicious lesions noted  Commons side effects, risks, benefits, and alternatives for medications and treatment plan prescribed today were discussed, and the patient expressed understanding of the given instructions. Patient is instructed to call or message via MyChart if he/she has any questions or concerns regarding our treatment plan. No barriers to understanding were identified. We discussed Red Flag symptoms and signs in detail. Patient expressed understanding regarding what to do in case of urgent or  emergency type symptoms.  Medication list was reconciled, printed and provided to the patient in AVS. Patient instructions and summary information was reviewed with the patient as documented in the AVS. This note was prepared with assistance of Dragon voice recognition software. Occasional wrong-word or sound-a-like substitutions may have occurred due to the inherent limitations of voice recognition software

## 2022-03-03 NOTE — Patient Instructions (Signed)
Please return in 6 months for recheck.   I will release your lab results to you on your MyChart account with further instructions. You may see the results before I do, but when I review them I will send you a message with my report or have my assistant call you if things need to be discussed. Please reply to my message with any questions. Thank you!   If you have any questions or concerns, please don't hesitate to send me a message via MyChart or call the office at [phone]. Thank you for visiting with us today! It's our pleasure caring for you.  

## 2022-03-04 LAB — URINE CULTURE
MICRO NUMBER:: 14272373
Result:: NO GROWTH
SPECIMEN QUALITY:: ADEQUATE

## 2022-03-05 ENCOUNTER — Telehealth: Payer: Self-pay

## 2022-03-05 NOTE — Telephone Encounter (Signed)
Please contact pt to schedule her for Prolia injection   Thank you,  Leamon Arnt

## 2022-03-05 NOTE — Telephone Encounter (Signed)
Is this patient due for prolia ?

## 2022-03-09 ENCOUNTER — Ambulatory Visit (INDEPENDENT_AMBULATORY_CARE_PROVIDER_SITE_OTHER): Payer: Medicare HMO

## 2022-03-09 DIAGNOSIS — Z0000 Encounter for general adult medical examination without abnormal findings: Secondary | ICD-10-CM

## 2022-03-09 NOTE — Progress Notes (Signed)
I connected with  Julia [name] on 03/09/22 by a audio enabled telemedicine application and verified that I am speaking with the correct person using two identifiers.  Patient Location: Home  Provider Location: Office/Clinic  I discussed the limitations of evaluation and management by telemedicine. The patient expressed understanding and agreed to proceed.   Subjective:   Julia [name] is a 81 y.o. female who presents for Medicare Annual (Subsequent) preventive examination.  Review of Systems     Cardiac Risk Factors include: advanced age (>35mn, >>36women);dyslipidemia     Objective:    There were no vitals filed for this visit. There is no height or weight on file to calculate BMI.     03/09/2022    8:28 AM 05/18/2021    4:04 PM 02/24/2021    8:55 AM 02/26/2020    1:33 PM 02/05/2020    9:49 AM 08/03/2019    9:38 AM 01/27/2019    3:20 PM  Advanced Directives  Does Patient Have a Medical Advance Directive? Yes No Yes Yes Yes No Yes  Type of [place]  Healthcare Power of Attorney  Living will;Healthcare [place]  Does patient want to make changes to medical advance directive? No - Patient declined      No - Patient declined  Copy of [place] Chart? Yes - validated most recent copy scanned in chart (See row information)  Yes - validated most recent copy scanned in chart (See row information)  No - copy requested  No - copy requested  Would patient like information on creating a medical advance directive?  No - Patient declined    No - Patient declined     Current Medications (verified) Outpatient Encounter Medications as of 03/09/2022  Medication Sig   Acetaminophen (TYLENOL PO) Take by mouth as needed.   Calcium Citrate-Vitamin D 315-250 MG-UNIT TABS Take by mouth.   carbidopa-levodopa (SINEMET CR) 50-200 MG tablet Take 1 tablet by mouth 3 (three) times  daily.   denosumab (PROLIA) 60 MG/ML SOSY injection Inject 60 mg into the skin every 6 (six) months.   diphenhydrAMINE (BENADRYL) 12.5 MG chewable tablet Chew 12.5 mg by mouth every 6 (six) hours as needed.   EPINEPHrine 0.3 mg/0.3 mL IJ SOAJ injection INJECT INTO THE MIDDLE OF THE OUTER THIGH AND HOLD FOR 3 SECONDS AS NEEDED FOR SEVERE ALLERGIC REACTION THEN CALL 911 IF USED.   fluticasone (FLONASE) 50 MCG/ACT nasal spray PLACE 2 SPRAYS IN EACH NOSTRIL DAILY AS NEEDED FOR ALLERGIES OR RHINITIS   levothyroxine (SYNTHROID) 88 MCG tablet TAKE ONE TABLET BY MOUTH DAILY FOR BREAKFAST   Melatonin 1 MG CAPS Take by mouth as needed.   Multiple Vitamins-Minerals (CENTRUM SILVER PO) Take by mouth.   naproxen sodium (ALEVE) 220 MG tablet Take 220 mg by mouth as needed.   pramipexole (MIRAPEX) 0.25 MG tablet Take 1 tablet (0.25 mg total) by mouth 2 (two) times daily. Take 1 pill 2 hours before bedtime and 1 pill at bedtime.   Probiotic Product (PROBIOTIC DAILY PO) Take by mouth.   SALINE NASAL MIST NA Place into the nose.   UNABLE TO FIND Med Name: Allergy shots every 4 weeks   No facility-administered encounter medications on file as of 03/09/2022.    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[place])    Past Surgical History:  Procedure Laterality Date   ADENOIDECTOMY     BREAST LUMPECTOMY Left    radiation only   BUNIONECTOMY     COLON SURGERY     HEMORRHOID SURGERY     LAPAROSCOPY      NASAL SINUS SURGERY     OOPHORECTOMY     TONSILLECTOMY     TUBAL LIGATION     Family History  Problem Relation Age of Onset   Arthritis Mother    Diabetes Mother    Heart disease Mother    Hypertension Mother    Arthritis Father    Cancer Father    Depression Father    Osteoporosis Father    Arthritis Sister    Cancer Sister    Diabetes Sister    Cancer Sister    Cancer Sister    Hyperlipidemia Sister    Cancer Brother    Hyperlipidemia Brother    Parkinson's disease Neg Hx    Social History   Socioeconomic History   Marital status: Married    Spouse name: Not on file   Number of children: 3   Years of education: masters + 10 credits   Highest education level: Master's degree (e.g., MA, MS, MEng, MEd, MSW, MBA)  Occupational History   Occupation: Retired Pharmacist, hospital   Tobacco Use   Smoking status: Never   Smokeless tobacco: Never  Vaping Use   Vaping Use: Never used  Substance and Sexual Activity   Alcohol use: Yes    Comment: a beer once a month with pizza   Drug use: Never   Sexual activity: Not on file  Other Topics Concern   Not on file  Social History Narrative   Right handed    Lives with husband    Caffeine 2 cups daily    Social Determinants of Health   Financial Resource Strain: Low Risk  (03/09/2022)   Overall Financial Resource Strain (CARDIA)    Difficulty of Paying Living Expenses: Not hard at all  Food Insecurity: No Food Insecurity (03/09/2022)   Hunger Vital Sign    Worried About Running Out of Food in the Last Year: Never true    Ran Out of Food in the Last Year: Never true  Transportation Needs: No Transportation Needs (03/09/2022)   PRAPARE - Hydrologist (Medical): No    Lack of Transportation (Non-Medical): No  Physical Activity: Sufficiently Active (03/09/2022)   Exercise Vital Sign    Days of Exercise per Week: 7 days    Minutes of Exercise per Session: 30 min  Stress: No Stress Concern Present  (03/09/2022)   [place]    Feeling of Stress : Only a little  Social Connections: Socially Integrated (03/09/2022)   Social Connection and Isolation Panel [NHANES]    Frequency of Communication with Friends and Family: More than three times a week    Frequency of Social Gatherings with Friends and Family: More than three times a week    Attends Religious Services: More than 4 times per year  Active Member of Clubs or Organizations: Yes    Attends Archivist Meetings: 1 to 4 times per year    Marital Status: Married    Tobacco Counseling Counseling given: Not Answered   Clinical Intake:  Pre-visit preparation completed: Yes  Pain : No/denies pain     BMI - recorded: 23.24 Nutritional Status: BMI of 19-24  Normal Nutritional Risks: None Diabetes: No  How often do you need to have someone help you when you read instructions, pamphlets, or other written materials from your doctor or pharmacy?: 1 - Never  Diabetic?no   Interpreter Needed?: No  Information entered by :: Charlott Rakes, LPN   Activities of Daily Living    03/09/2022    8:30 AM  In your present state of health, do you have any difficulty performing the following activities:  Hearing? 0  Vision? 0  Difficulty concentrating or making decisions? 0  Walking or climbing stairs? 0  Dressing or bathing? 0  Doing errands, shopping? 0  Preparing Food and eating ? N  Using the Toilet? N  In the past six months, have you accidently leaked urine? Y  Comment at times depending on if i wait too long  Do you have problems with loss of bowel control? N  Managing your Medications? N  Managing your Finances? N  Housekeeping or managing your Housekeeping? N    Patient Care Team: Leamon Arnt, MD as PCP - General (Family Medicine) Kathrynn Ducking, MD (Inactive) as Consulting Physician (Neurology) Bobbitt, Sedalia Muta, MD as  Consulting Physician (Allergy and Immunology) Mansouraty, Telford Nab., MD as Consulting Physician (Gastroenterology) Truitt Merle, MD as Consulting Physician (Oncology) Lyndee Hensen, PT as Physical Therapist (Physical Therapy) Trula Slade, DPM as Consulting Physician (Podiatry) Star Age, MD as Attending Physician (Neurology) Edythe Clarity, [facility] as Pharmacist (Pharmacist) Jeronimo Greaves, MD as Referring Physician (Family Medicine)  Indicate any recent Medical Services you may have received from other than [facility] providers in the past year (date may be approximate).     Assessment:   This is a routine wellness examination for [facility]  Hearing/Vision screen Hearing Screening - Comments:: Pt wears hearing aids  Vision Screening - Comments:: Pt follows up with Dr Teodoro Spray for annual eye exams   Dietary issues and exercise activities discussed: Current Exercise Habits: Home exercise routine, Type of exercise: walking, Time (Minutes): 30, Frequency (Times/Week): 7, Weekly Exercise (Minutes/Week): 210   Goals Addressed             This Visit's Progress    Patient Stated       Get my blood sugar WNL       Depression Screen    03/09/2022    8:26 AM 03/03/2022   11:21 AM 01/12/2022    1:28 PM 02/24/2021    8:53 AM 02/05/2021   10:59 AM 02/05/2020    9:47 AM 01/27/2019    3:21 PM  PHQ 2/9 Scores  PHQ - 2 Score 0 0 0 0 0 0 0    Fall Risk    03/09/2022    8:29 AM 03/03/2022   11:21 AM 01/12/2022    1:27 PM 08/08/2021   10:18 AM 02/24/2021    8:56 AM  Fall Risk   Falls in the past year? '1 1 1 '$ 0 1  Number falls in past yr: 1 0 0 0 1  Injury with Fall? '1 1 1 '$ 0 1  Comment   Stiches over Rt eye  bruises  Risk for fall due to : Impaired vision;Impaired balance/gait No Fall Risks History of fall(s) No Fall Risks Impaired vision  Follow up Falls prevention discussed Falls evaluation completed Falls evaluation completed Falls evaluation completed Falls prevention  discussed    FALL RISK PREVENTION PERTAINING TO THE HOME:  Any stairs in or around the home? No  If so, are there any without handrails? No  Home free of loose throw rugs in walkways, pet beds, electrical cords, etc? Yes  Adequate lighting in your home to reduce risk of falls? Yes   ASSISTIVE DEVICES UTILIZED TO PREVENT FALLS:  Life alert? No  Use of a cane, walker or w/c? No  Grab bars in the bathroom? Yes  Shower chair or bench in shower? Yes  Elevated toilet seat or a handicapped toilet? No   TIMED UP AND GO:  Was the test performed? No .   Cognitive Function: Pt declined at this time     11/13/2019   11:28 AM 05/29/2019   11:03 AM 12/28/2018   11:08 AM  MMSE - Mini Mental State Exam  Orientation to time '5 5 5  '$ Orientation to Place '5 5 5  '$ Registration '3 3 3  '$ Attention/ Calculation '5 2 5  '$ Recall '3 3 3  '$ Language- name 2 objects '2 2 2  '$ Language- repeat '1 1 1  '$ Language- follow 3 step command '3 3 3  '$ Language- read & follow direction '1 1 1  '$ Write a sentence '1 1 1  '$ Copy design '1 1 1  '$ Total score '30 27 30        '$ 02/24/2021    9:00 AM 01/27/2019    3:21 PM  6CIT Screen  What Year? 0 points 0 points  What month? 0 points 0 points  What time? 0 points 0 points  Count back from 20 0 points 0 points  Months in reverse 0 points 0 points  Repeat phrase 0 points 0 points  Total Score 0 points 0 points    Immunizations Immunization History  Administered Date(s) Administered   Fluad Quad(high Dose 65+) 12/16/2018, 01/01/2020, 01/01/2021, 12/25/2021   Influenza, High Dose Seasonal PF 01/05/2015   Influenza,inj,Quad PF,6+ Mos 01/10/2018   Moderna Sars-Covid-2 Vaccination 04/10/2019, 05/08/2019   PFIZER(Purple Top)SARS-COV-2 Vaccination 02/02/2020   Pfizer Covid-19 Vaccine Bivalent Booster 22yr & up 01/01/2021   Pneumococcal Conjugate-13 01/16/2014   Pneumococcal Polysaccharide-23 12/10/2007, 01/29/2015   Respiratory Syncytial Virus Vaccine,Recomb Aduvanted(Arexvy)  12/16/2021   Td 01/05/2022   Tdap 01/05/2022   Zoster Recombinat (Shingrix) 10/15/2017, 01/12/2018, 02/17/2018    TDAP status: Up to date  Flu Vaccine status: Up to date  Pneumococcal vaccine status: Up to date  Covid-19 vaccine status: Completed vaccines  Qualifies for Shingles Vaccine? Yes   Zostavax completed Yes   Shingrix Completed?: Yes  Screening Tests Health Maintenance  Topic Date Due   COVID-19 Vaccine (5 - 2023-24 season) 03/19/2022 (Originally 11/28/2021)   DEXA SCAN  09/21/2022   MAMMOGRAM  09/23/2022   Medicare Annual Wellness (AWV)  03/10/2023   DTaP/Tdap/Td (3 - Td or Tdap) 01/06/2032   Pneumonia Vaccine 79 Years old  Completed   INFLUENZA VACCINE  Completed   Zoster Vaccines- Shingrix  Completed   HPV VACCINES  Aged Out   Hepatitis C Screening  Discontinued    Health Maintenance  There are no preventive care reminders to display for this patient.   Colorectal cancer screening: Type of screening: Colonoscopy. Completed 03/30/16. Repeat every as directed  years  Mammogram status: Completed 09/22/21. Repeat every year  Bone Density status: Completed 09/20/20. Results reflect: Bone density results: OSTEOPOROSIS. Repeat every 2 years.   Additional Screening:  Hepatitis C Screening: does not qualify;  Vision Screening: Recommended annual ophthalmology exams for early detection of glaucoma and other disorders of the eye. Is the patient up to date with their annual eye exam?  Yes  Who is the provider or what is the name of the office in which the patient attends annual eye exams? Dr Sabra Heck  If pt is not established with a provider, would they like to be referred to a provider to establish care? No .   Dental Screening: Recommended annual dental exams for proper oral hygiene  Community Resource Referral / Chronic Care Management: CRR required this visit?  No   CCM required this visit?  No      Plan:     I have personally reviewed and noted the  following in the patient's chart:   Medical and social history Use of alcohol, tobacco or illicit drugs  Current medications and supplements including opioid prescriptions. Patient is not currently taking opioid prescriptions. Functional ability and status Nutritional status Physical activity Advanced directives List of other physicians Hospitalizations, surgeries, and ER visits in previous 12 months Vitals Screenings to include cognitive, depression, and falls Referrals and appointments  In addition, I have reviewed and discussed with patient certain preventive protocols, quality metrics, and best practice recommendations. A written personalized care plan for preventive services as well as general preventive health recommendations were provided to patient.     Willette Brace, LPN   16/12/9602   Nurse Notes:  Pt is requesting  nutritional counseling at [facility]. She stated she needed a prescription for insurance to cover please advise. Pt declined 6CIT at this time pt was alert and knowledgeable to questions asked.

## 2022-03-09 NOTE — Patient Instructions (Addendum)
[name] , Thank you for taking time to come for your Medicare Wellness Visit. I appreciate your ongoing commitment to your health goals. Please review the following plan we discussed and let me know if I can assist you in the future.   These are the goals we discussed:  Goals      Patient Stated     Be healthy     Patient Stated     Keep a positive mind with  Parkinson's Diagnosis      Patient Stated     Get my blood sugar WNL        This is a list of the screening recommended for you and due dates:  Health Maintenance  Topic Date Due   COVID-19 Vaccine (5 - 2023-24 season) 03/19/2022*   DEXA scan (bone density measurement)  09/21/2022   Mammogram  09/23/2022   Medicare Annual Wellness Visit  03/10/2023   DTaP/Tdap/Td vaccine (3 - Td or Tdap) 01/06/2032   Pneumonia Vaccine  Completed   Flu Shot  Completed   Zoster (Shingles) Vaccine  Completed   HPV Vaccine  Aged Out   Hepatitis C Screening: USPSTF Recommendation to screen - Ages 67-79 yo.  Discontinued  *Topic was postponed. The date shown is not the original due date.    Advanced directives: copies in chart   Conditions/risks identified: get blood sugar WNL   Next appointment: Follow up in one year for your annual wellness visit    Preventive Care 65 Years and Older, Female Preventive care refers to lifestyle choices and visits with your health care provider that can promote health and wellness. What does preventive care include? A yearly physical exam. This is also called an annual well check. Dental exams once or twice a year. Routine eye exams. Ask your health care provider how often you should have your eyes checked. Personal lifestyle choices, including: Daily care of your teeth and gums. Regular physical activity. Eating a healthy diet. Avoiding tobacco and drug use. Limiting alcohol use. Practicing safe sex. Taking low-dose aspirin every day. Taking vitamin and mineral supplements as recommended by  your health care provider. What happens during an annual well check? The services and screenings done by your health care provider during your annual well check will depend on your age, overall health, lifestyle risk factors, and family history of disease. Counseling  Your health care provider may ask you questions about your: Alcohol use. Tobacco use. Drug use. Emotional well-being. Home and relationship well-being. Sexual activity. Eating habits. History of falls. Memory and ability to understand (cognition). Work and work Statistician. Reproductive health. Screening  You may have the following tests or measurements: Height, weight, and BMI. Blood pressure. Lipid and cholesterol levels. These may be checked every 5 years, or more frequently if you are over 59 years old. Skin check. Lung cancer screening. You may have this screening every year starting at age 34 if you have a 30-pack-year history of smoking and currently smoke or have quit within the past 15 years. Fecal occult blood test (FOBT) of the stool. You may have this test every year starting at age 32. Flexible sigmoidoscopy or colonoscopy. You may have a sigmoidoscopy every 5 years or a colonoscopy every 10 years starting at age 11. Hepatitis C blood test. Hepatitis B blood test. Sexually transmitted disease (STD) testing. Diabetes screening. This is done by checking your blood sugar (glucose) after you have not eaten for a while (fasting). You may have this done every  1-3 years. Bone density scan. This is done to screen for osteoporosis. You may have this done starting at age 74. Mammogram. This may be done every 1-2 years. Talk to your health care provider about how often you should have regular mammograms. Talk with your health care provider about your test results, treatment options, and if necessary, the need for more tests. Vaccines  Your health care provider may recommend certain vaccines, such as: Influenza  vaccine. This is recommended every year. Tetanus, diphtheria, and acellular pertussis (Tdap, Td) vaccine. You may need a Td booster every 10 years. Zoster vaccine. You may need this after age 57. Pneumococcal 13-valent conjugate (PCV13) vaccine. One dose is recommended after age 57. Pneumococcal polysaccharide (PPSV23) vaccine. One dose is recommended after age 78. Talk to your health care provider about which screenings and vaccines you need and how often you need them. This information is not intended to replace advice given to you by your health care provider. Make sure you discuss any questions you have with your health care provider. Document Released: 04/12/2015 Document Revised: 12/04/2015 Document Reviewed: 01/15/2015 Elsevier Interactive Patient Education  2017 [place] Prevention in the Home Falls can cause injuries. They can happen to people of all ages. There are many things you can do to make your home safe and to help prevent falls. What can I do on the outside of my home? Regularly fix the edges of walkways and driveways and fix any cracks. Remove anything that might make you trip as you walk through a door, such as a raised step or threshold. Trim any bushes or trees on the path to your home. Use bright outdoor lighting. Clear any walking paths of anything that might make someone trip, such as rocks or tools. Regularly check to see if handrails are loose or broken. Make sure that both sides of any steps have handrails. Any raised decks and porches should have guardrails on the edges. Have any leaves, snow, or ice cleared regularly. Use sand or salt on walking paths during winter. Clean up any spills in your garage right away. This includes oil or grease spills. What can I do in the bathroom? Use night lights. Install grab bars by the toilet and in the tub and shower. Do not use towel bars as grab bars. Use non-skid mats or decals in the tub or shower. If you  need to sit down in the shower, use a plastic, non-slip stool. Keep the floor dry. Clean up any water that spills on the floor as soon as it happens. Remove soap buildup in the tub or shower regularly. Attach bath mats securely with double-sided non-slip rug tape. Do not have throw rugs and other things on the floor that can make you trip. What can I do in the bedroom? Use night lights. Make sure that you have a light by your bed that is easy to reach. Do not use any sheets or blankets that are too big for your bed. They should not hang down onto the floor. Have a firm chair that has side arms. You can use this for support while you get dressed. Do not have throw rugs and other things on the floor that can make you trip. What can I do in the kitchen? Clean up any spills right away. Avoid walking on wet floors. Keep items that you use a lot in easy-to-reach places. If you need to reach something above you, use a strong step stool that has a  grab bar. Keep electrical cords out of the way. Do not use floor polish or wax that makes floors slippery. If you must use wax, use non-skid floor wax. Do not have throw rugs and other things on the floor that can make you trip. What can I do with my stairs? Do not leave any items on the stairs. Make sure that there are handrails on both sides of the stairs and use them. Fix handrails that are broken or loose. Make sure that handrails are as long as the stairways. Check any carpeting to make sure that it is firmly attached to the stairs. Fix any carpet that is loose or worn. Avoid having throw rugs at the top or bottom of the stairs. If you do have throw rugs, attach them to the floor with carpet tape. Make sure that you have a light switch at the top of the stairs and the bottom of the stairs. If you do not have them, ask someone to add them for you. What else can I do to help prevent falls? Wear shoes that: Do not have high heels. Have rubber  bottoms. Are comfortable and fit you well. Are closed at the toe. Do not wear sandals. If you use a stepladder: Make sure that it is fully opened. Do not climb a closed stepladder. Make sure that both sides of the stepladder are locked into place. Ask someone to hold it for you, if possible. Clearly mark and make sure that you can see: Any grab bars or handrails. First and last steps. Where the edge of each step is. Use tools that help you move around (mobility aids) if they are needed. These include: Canes. Walkers. Scooters. Crutches. Turn on the lights when you go into a dark area. Replace any light bulbs as soon as they burn out. Set up your furniture so you have a clear path. Avoid moving your furniture around. If any of your floors are uneven, fix them. If there are any pets around you, be aware of where they are. Review your medicines with your doctor. Some medicines can make you feel dizzy. This can increase your chance of falling. Ask your doctor what other things that you can do to help prevent falls. This information is not intended to replace advice given to you by your health care provider. Make sure you discuss any questions you have with your health care provider. Document Released: 01/10/2009 Document Revised: 08/22/2015 Document Reviewed: 04/20/2014 Elsevier Interactive Patient Education  2017 [employer].

## 2022-03-10 ENCOUNTER — Ambulatory Visit (INDEPENDENT_AMBULATORY_CARE_PROVIDER_SITE_OTHER): Payer: Medicare HMO

## 2022-03-10 DIAGNOSIS — J309 Allergic rhinitis, unspecified: Secondary | ICD-10-CM | POA: Diagnosis not present

## 2022-03-10 NOTE — Telephone Encounter (Signed)
Pt ready for scheduling on or after 03/10/22  Out-of-pocket cost due at time of visit: $0  Primary: [employer]-Medicare Prolia co-insurance: 0% Admin fee co-insurance: 0%  Secondary:  Prolia co-insurance:  Admin fee co-insurance:   Deductible:   Prior Auth:  PA#  Valid: 01/27/22 to 01/28/2023   ** This summary of benefits is an estimation of the patient's out-of-pocket cost. Exact cost may vary based on individual plan coverage.

## 2022-03-11 NOTE — Telephone Encounter (Signed)
Patient states she was here on 03/03/22 and does not remember if she got her prolia shot. Can u please check to make sure she didn't have her prolia?   Thanks!

## 2022-03-13 ENCOUNTER — Telehealth: Payer: Self-pay | Admitting: Family Medicine

## 2022-03-13 DIAGNOSIS — R7301 Impaired fasting glucose: Secondary | ICD-10-CM

## 2022-03-13 NOTE — Telephone Encounter (Signed)
Amb ref to Medical Nutrition Therapy-MNT placed in to [facility]. Pt notified.

## 2022-03-13 NOTE — Telephone Encounter (Signed)
Following scheduling of prolia injection, pt stated she wanted to receive a script to nutrition counseling @ [facility]   Patient States: - She was informed that her sugar was elevated and in diabetic range during last labs on 03/03/22.  - She was informed that if PCP sent in a script for the counseling then insurance will cover it   Please advise.

## 2022-03-18 ENCOUNTER — Ambulatory Visit (INDEPENDENT_AMBULATORY_CARE_PROVIDER_SITE_OTHER): Payer: Medicare HMO

## 2022-03-18 DIAGNOSIS — M4722 Other spondylosis with radiculopathy, cervical region: Secondary | ICD-10-CM

## 2022-03-18 MED ORDER — DENOSUMAB 60 MG/ML ~~LOC~~ SOSY
60.0000 mg | PREFILLED_SYRINGE | Freq: Once | SUBCUTANEOUS | Status: AC
  Administered 2022-03-18: 60 mg via SUBCUTANEOUS

## 2022-03-18 NOTE — Progress Notes (Signed)
Patient presented today for Prolia 60 MG/ML as prescribed by Dr. Billey Chang. Patient was given R arm subcutaneous by Linus Galas, CMA. Patient tolerated injection well

## 2022-03-22 ENCOUNTER — Other Ambulatory Visit: Payer: Self-pay | Admitting: Family Medicine

## 2022-04-01 NOTE — Progress Notes (Signed)
Follow Up Note  RE: Julia [name] MRN: [id number] DOB: 19-May-1940 Date of Office Visit: 04/02/2022  Referring provider: Leamon Arnt, MD Primary care provider: Leamon Arnt, MD  Chief Complaint: No chief complaint on file.  History of Present Illness: I had the pleasure of seeing Julia [name] for a follow up visit at the [facility] on 04/01/2022. She is a 82 y.o. female, who is being followed for allergic rhinitis on AIT, history of nasal polyp and dyspnea on exertion. Her previous allergy office visit was on 11/19/2020 with Dr. Maudie Mercury. Today is a skin testing and follow up visit.  Seasonal and perennial allergic rhinitis Previous history - 2019 skin testing positive to tree pollen, molds and dust mite. Interim history - tried azelastine nasal spray with no benefit.  Still having some rhinorrhea.  Doing well with allergy injections. Has dry mouth.  Continue environmental control measures.  Continue allergy injections - given today.  Use Flonase (fluticasone) nasal spray 1-2 sprays per nostril once a day as needed for nasal congestion.  Try to use 1 spray per nostril instead of 2 sprays and see if you notice any difference.  Continue saline nasal spray as needed for nasal symptoms.  May use fexofenadine 180 mg once a day as needed for runny nose.  Patient declines Atrovent nasal spray for rhinorrhea.   History of nasal polyp Previous history-  s/p polypectomy 1970's Interim history - nasal congestion on the left side.  Continue fluticasone nasal spray 1-2 sprays each nostril once a day. If no improvement recommend ENT evaluation next.   Dyspnea on exertion Past history - at age 19 was diagnosed with pneumonia and pleurisy. Has had shortness of breath with exertion since she was in her 4's. Has seen 3 pulmonologist. Two of the pulmonologist's instructed her that scar tissue was the cause of her dyspnea. 2021 spirometry was normal. Interim history - unchanged.   Continue to monitor symptoms.    Return in about 1 year (around 11/19/2021).  Assessment and Plan: Julia [name] is a 82 y.o. female with: No problem-specific Assessment & Plan notes found for this encounter.  No follow-ups on file.  No orders of the defined types were placed in this encounter.  Lab Orders  No laboratory test(s) ordered today    Diagnostics: Spirometry:  Tracings reviewed. Her effort: {Blank single:19197::"Good reproducible efforts.","It was hard to get consistent efforts and there is a question as to whether this reflects a maximal maneuver.","Poor effort, data can not be interpreted."} FVC: ***L FEV1: ***L, ***% predicted FEV1/FVC ratio: ***% Interpretation: {Blank single:19197::"Spirometry consistent with mild obstructive disease","Spirometry consistent with moderate obstructive disease","Spirometry consistent with severe obstructive disease","Spirometry consistent with possible restrictive disease","Spirometry consistent with mixed obstructive and restrictive disease","Spirometry uninterpretable due to technique","Spirometry consistent with normal pattern","No overt abnormalities noted given today's efforts"}.  Please see scanned spirometry results for details.  Skin Testing: {Blank single:19197::"Select foods","Environmental allergy panel","Environmental allergy panel and select foods","Food allergy panel","None","Deferred due to recent antihistamines use"}. *** Results discussed with patient/family.   Medication List:  Current Outpatient Medications  Medication Sig Dispense Refill  . Acetaminophen (TYLENOL PO) Take by mouth as needed.    . Calcium Citrate-Vitamin D 315-250 MG-UNIT TABS Take by mouth.    . carbidopa-levodopa (SINEMET CR) 50-200 MG tablet Take 1 tablet by mouth 3 (three) times daily. 270 tablet 3  . denosumab (PROLIA) 60 MG/ML SOSY injection Inject 60 mg into the skin every 6 (six) months.    Marland Kitchen  diphenhydrAMINE (BENADRYL) 12.5 MG chewable tablet Chew  12.5 mg by mouth every 6 (six) hours as needed.    Marland Kitchen EPINEPHrine 0.3 mg/0.3 mL IJ SOAJ injection INJECT INTO THE MIDDLE OF THE OUTER THIGH AND HOLD FOR 3 SECONDS AS NEEDED FOR SEVERE ALLERGIC REACTION THEN CALL 911 IF USED. 2 each 1  . fluticasone (FLONASE) 50 MCG/ACT nasal spray PLACE 2 SPRAYS IN EACH NOSTRIL DAILY AS NEEDED FOR ALLERGIES OR RHINITIS 48 g 0  . levothyroxine (SYNTHROID) 88 MCG tablet TAKE ONE TABLET BY MOUTH DAILY FOR BREAKFAST 90 tablet 3  . Melatonin 1 MG CAPS Take by mouth as needed.    . methocarbamol (ROBAXIN) 500 MG tablet TAKE 1 TABLET (500 MG TOTAL) BY MOUTH 2 (TWO) TIMES DAILY AS NEEDED FOR MUSCLE SPASMS. 30 tablet 2  . Multiple Vitamins-Minerals (CENTRUM SILVER PO) Take by mouth.    . naproxen sodium (ALEVE) 220 MG tablet Take 220 mg by mouth as needed.    . pramipexole (MIRAPEX) 0.25 MG tablet Take 1 tablet (0.25 mg total) by mouth 2 (two) times daily. Take 1 pill 2 hours before bedtime and 1 pill at bedtime. 60 tablet 3  . Probiotic Product (PROBIOTIC DAILY PO) Take by mouth.    Marland Kitchen SALINE NASAL MIST NA Place into the nose.    Marland Kitchen UNABLE TO FIND Med Name: Allergy shots every 4 weeks     Current Facility-Administered Medications  Medication Dose Route Frequency Provider Last Rate Last Admin  . denosumab (PROLIA) injection 60 mg  60 mg Subcutaneous Once Leamon Arnt, MD       Allergies: Allergies  Allergen Reactions  . Cefdinir   . Cholestyramine     GERD  . Contrast Media [Iodinated Contrast Media] Diarrhea  . Ezetimibe   . Meperidine   . Nitrofurantoin Diarrhea  . Septra [Sulfamethoxazole-Trimethoprim] Diarrhea  . Statins   . Teriparatide    I reviewed her past medical history, social history, family history, and environmental history and no significant changes have been reported from her previous visit.  Review of Systems  Constitutional:  Negative for appetite change, fever and unexpected weight change.  HENT:  Positive for rhinorrhea. Negative for  congestion and postnasal drip.   Eyes:  Negative for itching.  Respiratory:  Negative for cough, chest tightness and wheezing.        Unchanged dyspnea on exertion.  Cardiovascular:  Negative for chest pain and palpitations.  Gastrointestinal:  Negative for abdominal pain.  Genitourinary:  Negative for difficulty urinating.  Skin:  Negative for rash.  Allergic/Immunologic: Positive for environmental allergies.  Neurological:  Negative for headaches.   Objective: There were no vitals taken for this visit. There is no height or weight on file to calculate BMI. Physical Exam Vitals and nursing note reviewed.  Constitutional:      Appearance: Normal appearance. She is well-developed.  HENT:     Head: Normocephalic and atraumatic.     Right Ear: Tympanic membrane and external ear normal.     Left Ear: Tympanic membrane and external ear normal.     Nose: Nose normal.     Mouth/Throat:     Mouth: Mucous membranes are moist.     Pharynx: Oropharynx is clear.  Eyes:     Conjunctiva/sclera: Conjunctivae normal.  Cardiovascular:     Rate and Rhythm: Normal rate and regular rhythm.     Heart sounds: Normal heart sounds. No murmur heard. Pulmonary:     Effort: Pulmonary effort is normal.  Breath sounds: Normal breath sounds. No wheezing, rhonchi or rales.  Musculoskeletal:     Cervical back: Neck supple.  Skin:    General: Skin is warm.     Findings: No rash.  Neurological:     Mental Status: She is alert and oriented to person, place, and time.  Psychiatric:        Behavior: Behavior normal.  Previous notes and tests were reviewed. The plan was reviewed with the patient/family, and all questions/concerned were addressed.  It was my pleasure to see Julia [name] today and participate in her care. Please feel free to contact me with any questions or concerns.  Sincerely,  Rexene Alberts, DO Allergy & Immunology  [facility] office:  [place] office: [phone]

## 2022-04-02 ENCOUNTER — Other Ambulatory Visit: Payer: Self-pay

## 2022-04-02 ENCOUNTER — Telehealth: Payer: Self-pay

## 2022-04-02 ENCOUNTER — Encounter: Payer: Self-pay | Admitting: Allergy

## 2022-04-02 ENCOUNTER — Ambulatory Visit: Payer: Medicare HMO | Admitting: Allergy

## 2022-04-02 VITALS — BP 120/78 | HR 86 | Temp 98.0°F | Resp 18 | Ht 63.0 in | Wt 132.0 lb

## 2022-04-02 DIAGNOSIS — Z8709 Personal history of other diseases of the respiratory system: Secondary | ICD-10-CM | POA: Diagnosis not present

## 2022-04-02 DIAGNOSIS — R0609 Other forms of dyspnea: Secondary | ICD-10-CM

## 2022-04-02 DIAGNOSIS — Z713 Dietary counseling and surveillance: Secondary | ICD-10-CM | POA: Insufficient documentation

## 2022-04-02 DIAGNOSIS — J310 Chronic rhinitis: Secondary | ICD-10-CM | POA: Insufficient documentation

## 2022-04-02 DIAGNOSIS — J3089 Other allergic rhinitis: Secondary | ICD-10-CM

## 2022-04-02 DIAGNOSIS — J302 Other seasonal allergic rhinitis: Secondary | ICD-10-CM

## 2022-04-02 MED ORDER — IPRATROPIUM BROMIDE 0.03 % NA SOLN: 1.0000 | Freq: Two times a day (BID) | NASAL | 5 refills | Status: DC | PRN

## 2022-04-02 NOTE — Telephone Encounter (Signed)
Per Dr.Kim please refer to ENT for chronic rhinitis, h/o multiple sinus surgeries and polypectomy

## 2022-04-02 NOTE — Assessment & Plan Note (Signed)
Use Atrovent (ipratropium) 0.03% 1-2 sprays per nostril twice a day as needed for runny nose/drainage.

## 2022-04-02 NOTE — Assessment & Plan Note (Signed)
Past history - at age 82 was diagnosed with pneumonia and pleurisy. Has had shortness of breath with exertion since she was in her 73's. Has seen 3 pulmonologist. Two of the pulmonologist's instructed her that scar tissue was the cause of her dyspnea. 2021 spirometry was normal. Interim history - unchanged.  Continue to monitor symptoms.

## 2022-04-02 NOTE — Patient Instructions (Addendum)
Today's skin testing was positive to mold and dust mites. Negative to select foods including chocolate.   Allergic rhinitis Continue environmental control measures.  Stop allergy injections - let us know if you have worsening symptoms and then we can restart.  Use Flonase (fluticasone) nasal spray 1-2 sprays per nostril once a day as needed for nasal congestion.  Continue saline nasal spray as needed for nasal symptoms.  May use fexofenadine 180 mg once a day as needed for runny nose.   Gustatory rhinitis Use Atrovent (ipratropium) 0.03% 1-2 sprays per nostril twice a day as needed for runny nose/drainage.  Food Dairy containing foods can thicken mucous.  History of nasal polyp Continue fluticasone nasal spray 1-2 sprays each nostril once a day. Recommend seeing ENT next. [phone] [email or web address]  Dyspnea with exertion Continue to monitor symptoms.   Follow up in 4 months or sooner if needed.   Mold Control Mold and fungi can grow on a variety of surfaces provided certain temperature and moisture conditions exist.  Outdoor molds grow on plants, decaying vegetation and soil. The major outdoor mold, Alternaria and Cladosporium, are found in very high numbers during hot and dry conditions. Generally, a late summer - fall peak is seen for common outdoor fungal spores. Rain will temporarily lower outdoor mold spore count, but counts rise rapidly when the rainy period ends. The most important indoor molds are Aspergillus and Penicillium. Dark, humid and poorly ventilated basements are ideal sites for mold growth. The next most common sites of mold growth are the bathroom and the kitchen. Outdoor (Seasonal) Mold Control Use air conditioning and keep windows closed. Avoid exposure to decaying vegetation. Avoid leaf raking. Avoid grain handling. Consider wearing a face mask if working in moldy areas.  Indoor  (Perennial) Mold Control  Maintain humidity below 50%. Get rid of mold growth on hard surfaces with water, detergent and, if necessary, 5% bleach (do not mix with other cleaners). Then dry the area completely. If mold covers an area more than 10 square feet, consider hiring an indoor environmental professional. For clothing, washing with soap and water is best. If moldy items cannot be cleaned and dried, throw them away. Remove sources e.g. contaminated carpets. Repair and seal leaking roofs or pipes. Using dehumidifiers in damp basements may be helpful, but empty the water and clean units regularly to prevent mildew from forming. All rooms, especially basements, bathrooms and kitchens, require ventilation and cleaning to deter mold and mildew growth. Avoid carpeting on concrete or damp floors, and storing items in damp areas. Control of House Dust Mite Allergen Dust mite allergens are a common trigger of allergy and asthma symptoms. While they can be found throughout the house, these microscopic creatures thrive in warm, humid environments such as bedding, upholstered furniture and carpeting. Because so much time is spent in the bedroom, it is essential to reduce mite levels there.  Encase pillows, mattresses, and box springs in special allergen-proof fabric covers or airtight, zippered plastic covers.  Bedding should be washed weekly in hot water (130 F) and dried in a hot dryer. Allergen-proof covers are available for comforters and pillows that can't be regularly washed.  Wash the allergy-proof covers every few months. Minimize clutter in the bedroom. Keep pets out of the bedroom.  Keep humidity less than 50% by using a dehumidifier or air conditioning. You can buy a humidity measuring device called a hygrometer to monitor this.  If possible, replace carpets with hardwood, linoleum, or washable area rugs. If  that's not possible, vacuum frequently with a vacuum that has a HEPA filter. Remove all  upholstered furniture and non-washable window drapes from the bedroom. Remove all non-washable stuffed toys from the bedroom.  Wash stuffed toys weekly.

## 2022-04-02 NOTE — Assessment & Plan Note (Signed)
Previous history-  s/p polypectomy 1970's.  Interim history - nasal congestion on the left side still.  Continue fluticasone nasal spray 1-2 sprays each nostril once a day. Refer to ENT.

## 2022-04-02 NOTE — Assessment & Plan Note (Addendum)
Diagnosed with pre-diabetes and noticed that chocolate increases mucous production and gets rhinorrhea after eating. She is concerned about food allergies.  Today's skin testing was negative to common foods including chocolate.  Dairy containing foods can thicken mucous - limit intake.

## 2022-04-02 NOTE — Assessment & Plan Note (Addendum)
Previous history - 2019 skin testing positive to tree pollen, molds and dust mite. Azelastine ineffective.  Interim history - wants to come off AIT.  Today's skin testing was positive to mold and dust mites. Continue environmental control measures.  Stop allergy injections - let us know if you have worsening symptoms and then we can restart.  Offered for patient to finish her current vials but declined and will hold for now.  Use Flonase (fluticasone) nasal spray 1-2 sprays per nostril once a day as needed for nasal congestion.  Continue saline nasal spray as needed for nasal symptoms.  May use fexofenadine 180 mg once a day as needed for runny nose.

## 2022-04-07 ENCOUNTER — Encounter: Payer: Medicare HMO | Attending: Family Medicine | Admitting: Dietician

## 2022-04-07 VITALS — Ht 64.0 in | Wt 138.0 lb

## 2022-04-07 DIAGNOSIS — E119 Type 2 diabetes mellitus without complications: Secondary | ICD-10-CM | POA: Diagnosis present

## 2022-04-09 ENCOUNTER — Encounter: Payer: Self-pay | Admitting: Dietician

## 2022-04-09 NOTE — Progress Notes (Signed)
Patient was seen on 04/07/2021 for the first of a series of three diabetes self[facility]management courses [facility]  Patient Education Plan per assessed needs and concerns is to attend three course education program for Diabetes Self Management Education.  A1C was 6.5% on 03/03/2022.  The following learning objectives were met by the patient during this class: Describe diabetes, types of diabetes and pathophysiology State some common risk factors for diabetes Defines the role of glucose and insulin Describe the relationship between diabetes and cardiovascular and other risks State the members of the Healthcare Team States the rationale for glucose monitoring and when to test State their individual [place] the importance of logging glucose readings and how to interpret the readings Identifies A1C target Explain the correlation between A1c and eAG values State symptoms and treatment of high blood glucose and low blood glucose Explain proper technique for glucose testing and identify proper sharps disposal  Handouts given during class include: How to Thrive:  A Guide for Your Journey with Diabetes by the ADA Meal Plan Card and carbohydrate content list Dietary intake form Low Sodium Flavoring Tips Types of Fats Dining Out Label reading Snack list The diabetes portion plate Diabetes Resources A1c to eAG Conversion Chart Blood Glucose Log Diabetes Recommended Care Schedule Support Group Diabetes Success Plan Core Class Satisfaction Survey   Follow-Up Plan: Attend core 2

## 2022-04-14 ENCOUNTER — Ambulatory Visit: Payer: Medicare HMO

## 2022-04-21 ENCOUNTER — Encounter: Payer: Medicare HMO | Admitting: Dietician

## 2022-04-21 ENCOUNTER — Encounter: Payer: Self-pay | Admitting: Dietician

## 2022-04-21 DIAGNOSIS — E119 Type 2 diabetes mellitus without complications: Secondary | ICD-10-CM

## 2022-04-21 NOTE — Progress Notes (Signed)
Patient was seen on 04/21/2022 for the second of a series of three diabetes self-management courses at the [facility]. The following learning objectives were met by the patient during this class:  Describe the role of different macronutrients on glucose Explain how carbohydrates affect blood glucose State what foods contain the most carbohydrates Demonstrate carbohydrate counting Demonstrate how to read Nutrition Facts food label Describe effects of various fats on heart health Describe the importance of good nutrition for health and healthy eating strategies Describe techniques for managing your shopping, cooking and meal planning List strategies to follow meal plan when dining out Describe the effects of alcohol on glucose and how to use it safely  Goals:  Follow Diabetes Meal Plan as instructed  Aim to spread carbs evenly throughout the day  Aim for 3 meals per day and snacks as needed Include lean protein foods to meals/snacks  Monitor glucose levels as instructed by your doctor   Follow-Up Plan: Attend Core 3 Work towards following your personal food plan.    Patient was seen on 04/21/22 for the third of a series of three diabetes self-management courses at [facility].   State the amount of activity recommended for healthy living Describe activities suitable for individual needs Identify ways to regularly incorporate activity into daily life Identify barriers to activity and ways to over come these barriers Identify diabetes medications being personally used and their primary action for lowering glucose and possible side effects Describe role of stress on blood glucose and develop strategies to address psychosocial issues Identify diabetes complications and ways to prevent them Explain how to manage diabetes during illness Evaluate success in meeting personal goal Establish 2-3 goals that they will plan to  diligently work on  Goals:  I will count my carb choices at most meals and snacks I will eat less unhealthy fats by eating less sweets and chocolate  Your patient has identified these potential barriers to change:  Motivation health  Your patient has identified their diabetes self-care support plan as  [facility] Support Group   Plan:  Attend Support Group as desired

## 2022-04-22 ENCOUNTER — Ambulatory Visit (INDEPENDENT_AMBULATORY_CARE_PROVIDER_SITE_OTHER): Payer: Medicare HMO | Admitting: Family Medicine

## 2022-04-22 ENCOUNTER — Encounter: Payer: Self-pay | Admitting: Family Medicine

## 2022-04-22 ENCOUNTER — Ambulatory Visit: Payer: Medicare HMO | Admitting: Family Medicine

## 2022-04-22 VITALS — BP 110/75 | HR 89 | Temp 98.4°F | Resp 16 | Ht 64.5 in | Wt 137.8 lb

## 2022-04-22 DIAGNOSIS — S90221A Contusion of right lesser toe(s) with damage to nail, initial encounter: Secondary | ICD-10-CM | POA: Diagnosis not present

## 2022-04-22 NOTE — Progress Notes (Signed)
Subjective  CC:  Chief Complaint  Patient presents with   Toe Injury    Right little toe Hit against the wall while putting shoe on     HPI: Julia [name] is a 82 y.o. female who presents to the office today to address the problems listed above in the chief complaint. Hit right 5th toe on hard moccassin shoe. 4 days ago. Mild pain but still bruised.   Assessment  1. Contusion of lesser toe of right foot with damage to nail, initial encounter      Plan  contusion:  doubt fractured but it is possible. She is not too symptomatic. Supportive shoes, tylenol and time. Should do fine.   Follow up: prn  Visit date not found  No orders of the defined types were placed in this encounter.  No orders of the defined types were placed in this encounter.     I reviewed the patients updated PMH, FH, and SocHx.    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/13/2015    Priority: High   History of breast cancer left, 2016 02/13/2015    Priority: High   Multiple pulmonary nodules 06/20/2019    Priority: Medium    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    Priority: Medium    Anxiety 09/29/2017    Priority: Medium    Dysfunctional voiding of urine 09/13/2016    Priority: Medium    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    Priority: Medium    Gastroesophageal reflux disease 09/13/2016    Priority: Medium    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    Priority: Medium    Irritable bowel syndrome with both  constipation and diarrhea 02/13/2015    Priority: Medium    Pelvic floor dysfunction 08/26/2018    Priority: Low   Female bladder prolapse 04/07/2018    Priority: Low   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   Calcium Citrate-Vitamin D 315-250 MG-UNIT TABS Take by mouth.   carbidopa-levodopa (SINEMET CR) 50-200 MG tablet Take 1 tablet by mouth 3 (three) times daily.   denosumab (PROLIA) 60 MG/ML SOSY injection Inject 60 mg into the skin every 6 (six) months.   diphenhydrAMINE (BENADRYL) 12.5 MG chewable tablet Chew 12.5 mg by mouth every 6 (six) hours as needed.   EPINEPHrine 0.3 mg/0.3 mL IJ SOAJ injection INJECT INTO THE MIDDLE OF THE OUTER THIGH AND HOLD FOR 3 SECONDS  AS NEEDED FOR SEVERE ALLERGIC REACTION THEN CALL 911 IF USED.   fluticasone (FLONASE) 50 MCG/ACT nasal spray PLACE 2 SPRAYS IN EACH NOSTRIL DAILY AS NEEDED FOR ALLERGIES OR RHINITIS   ipratropium (ATROVENT) 0.03 % nasal spray Place 1-2 sprays into both nostrils 2 (two) times daily as needed (nasal drainage).   levothyroxine (SYNTHROID) 88 MCG tablet TAKE ONE TABLET BY MOUTH DAILY FOR BREAKFAST   Melatonin 1 MG CAPS Take by mouth as needed.   methocarbamol (ROBAXIN) 500 MG tablet TAKE 1 TABLET (500 MG TOTAL) BY MOUTH 2 (TWO) TIMES DAILY AS NEEDED FOR MUSCLE SPASMS.   Multiple Vitamins-Minerals (CENTRUM SILVER PO) Take by mouth.   naproxen sodium (ALEVE) 220 MG tablet Take 220 mg by mouth as needed.   pramipexole (MIRAPEX)  0.25 MG tablet Take 1 tablet (0.25 mg total) by mouth 2 (two) times daily. Take 1 pill 2 hours before bedtime and 1 pill at bedtime.   Probiotic Product (PROBIOTIC DAILY PO) Take by mouth.   SALINE NASAL MIST NA Place into the nose.   UNABLE TO FIND Med Name: Allergy shots every 4 weeks    Allergies: Patient is allergic to cefdinir, cholestyramine, contrast media [iodinated contrast media], ezetimibe, meperidine, nitrofurantoin, septra [sulfamethoxazole-trimethoprim], statins, and teriparatide. Family History: Patient family history includes Arthritis in her father, mother, and sister; Cancer in her brother, father, sister, sister, and sister; Depression in her father; Diabetes in her mother and sister; Heart disease in her mother; Hyperlipidemia in her brother and sister; Hypertension in her mother; Osteoporosis in her father. Social History:  Patient  reports that she has never smoked. She has never used smokeless tobacco. She reports current alcohol use. She reports that she does not use drugs.  Review of Systems: Constitutional: Negative for fever malaise or anorexia Cardiovascular: negative for chest pain Respiratory: negative for SOB or persistent cough Gastrointestinal: negative for abdominal pain  Objective  Vitals: BP 110/75   Pulse 89   Temp 98.4 F (36.9 C) (Temporal)   Resp 16   Ht 5' 4.5" (1.638 m)   Wt 137 lb 12.8 oz (62.5 kg)   SpO2 96%   BMI 23.29 kg/m  General: no acute distress , A&Ox3 Right 5th toe with ecchymosis and distal ttp  Commons side effects, risks, benefits, and alternatives for medications and treatment plan prescribed today were discussed, and the patient expressed understanding of the given instructions. Patient is instructed to call or message via MyChart if he/she has any questions or concerns regarding our treatment plan. No barriers to understanding were identified. We discussed Red Flag symptoms and signs in detail. Patient expressed understanding  regarding what to do in case of urgent or emergency type symptoms.  Medication list was reconciled, printed and provided to the patient in AVS. Patient instructions and summary information was reviewed with the patient as documented in the AVS. This note was prepared with assistance of Dragon voice recognition software. Occasional wrong-word or sound-a-like substitutions may have occurred due to the inherent limitations of voice recognition software

## 2022-04-23 ENCOUNTER — Ambulatory Visit: Payer: Medicare HMO | Admitting: Family Medicine

## 2022-04-23 ENCOUNTER — Ambulatory Visit: Payer: Medicare HMO

## 2022-05-11 ENCOUNTER — Other Ambulatory Visit: Payer: Self-pay | Admitting: Neurology

## 2022-06-17 ENCOUNTER — Ambulatory Visit (INDEPENDENT_AMBULATORY_CARE_PROVIDER_SITE_OTHER): Payer: Medicare HMO | Admitting: Family

## 2022-06-17 ENCOUNTER — Encounter: Payer: Self-pay | Admitting: Family

## 2022-06-17 VITALS — Temp 97.1°F | Ht 64.5 in | Wt 136.2 lb

## 2022-06-17 DIAGNOSIS — K582 Mixed irritable bowel syndrome: Secondary | ICD-10-CM

## 2022-06-17 DIAGNOSIS — L602 Onychogryphosis: Secondary | ICD-10-CM

## 2022-06-17 DIAGNOSIS — L989 Disorder of the skin and subcutaneous tissue, unspecified: Secondary | ICD-10-CM | POA: Diagnosis not present

## 2022-06-17 NOTE — Progress Notes (Signed)
Patient ID: Julia [name], female    DOB: 1941-03-13, 82 y.o.   MRN: [id number]  Chief Complaint  Patient presents with   Rash    Pt c/o skin problem on right arm for about 3 weeks, not painful. Also a red spot in the back of her head.    Nail Problem    Pt c/o ingrown toe nails.      HPI:      Bowel program:  pasty stools, frequent, incontinence, sometimes diarrhea sometimes constipation, hx of colon cancer Skin rash:  right lower arm, started as a flat red spot, then had a little white in middle, denies any drainage or itching, first noticed about 3 weeks ago. Also has a red spot on the crown of her head, reports having this for years but seems to be spreading.  Thickened toenails:  bilateral feet, curled under, extra skin on side of nailbed on great toe on left foot.       Assessment & Plan:  1. Skin lesion-  one on right wrist - picture taken for EMR. 2nd on crown of head, referring to [facility], pt reports last DERM was not friendly, and they would not switch her to another provider, would like a new referral to new office.  - Ambulatory referral to Dermatology  2. Thickened nails-  - Ambulatory referral to Podiatry  3. Irritable bowel syndrome with both constipation and diarrhea-  - Ambulatory referral to Gastroenterology   Subjective:    Outpatient Medications Prior to Visit  Medication Sig Dispense Refill   Acetaminophen (TYLENOL PO) Take by mouth as needed.     Calcium Citrate-Vitamin D 315-250 MG-UNIT TABS Take by mouth.     carbidopa-levodopa (SINEMET CR) 50-200 MG tablet Take 1 tablet by mouth 3 (three) times daily. 270 tablet 3   denosumab (PROLIA) 60 MG/ML SOSY injection Inject 60 mg into the skin every 6 (six) months.     diphenhydrAMINE (BENADRYL) 12.5 MG chewable tablet Chew 12.5 mg by mouth every 6 (six) hours as needed.     EPINEPHrine 0.3 mg/0.3 mL IJ SOAJ injection INJECT INTO THE MIDDLE OF THE OUTER THIGH AND HOLD FOR 3 SECONDS AS NEEDED FOR SEVERE ALLERGIC  REACTION THEN CALL 911 IF USED. 2 each 1   fluticasone (FLONASE) 50 MCG/ACT nasal spray PLACE 2 SPRAYS IN EACH NOSTRIL DAILY AS NEEDED FOR ALLERGIES OR RHINITIS 48 g 0   ipratropium (ATROVENT) 0.03 % nasal spray Place 1-2 sprays into both nostrils 2 (two) times daily as needed (nasal drainage). 30 mL 5   levothyroxine (SYNTHROID) 88 MCG tablet TAKE ONE TABLET BY MOUTH DAILY FOR BREAKFAST 90 tablet 3   Melatonin 1 MG CAPS Take by mouth as needed.     methocarbamol (ROBAXIN) 500 MG tablet TAKE 1 TABLET (500 MG TOTAL) BY MOUTH 2 (TWO) TIMES DAILY AS NEEDED FOR MUSCLE SPASMS. 30 tablet 2   Multiple Vitamins-Minerals (CENTRUM SILVER PO) Take by mouth.     naproxen sodium (ALEVE) 220 MG tablet Take 220 mg by mouth as needed.     pramipexole (MIRAPEX) 0.25 MG tablet TAKE 1 TABLET (0.25 MG TOTAL) BY MOUTH 2 (TWO) TIMES DAILY. TAKE 1 PILL 2 HOURS BEFORE BEDTIME AND 1 PILL AT BEDTIME. 180 tablet 1   Probiotic Product (PROBIOTIC DAILY PO) Take by mouth.     SALINE NASAL MIST NA Place into the nose.     UNABLE TO FIND Med Name: Allergy shots every 4 weeks  No facility-administered medications prior to visit.   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[employer])    Past Surgical History:  Procedure Laterality Date   ADENOIDECTOMY     BREAST LUMPECTOMY Left    radiation only   BUNIONECTOMY     COLON SURGERY     HEMORRHOID SURGERY     LAPAROSCOPY     NASAL SINUS SURGERY     OOPHORECTOMY     TONSILLECTOMY     TUBAL LIGATION     Allergies  Allergen Reactions   Cefdinir    Cholestyramine     GERD   Contrast Media [Iodinated Contrast Media]  Diarrhea   Ezetimibe    Meperidine    Nitrofurantoin Diarrhea   Septra [Sulfamethoxazole-Trimethoprim] Diarrhea   Statins    Teriparatide       Objective:    Physical Exam Vitals and nursing note reviewed.  Constitutional:      Appearance: Normal appearance.  Cardiovascular:     Rate and Rhythm: Normal rate and regular rhythm.  Pulmonary:     Effort: Pulmonary effort is normal.     Breath sounds: Normal breath sounds.  Musculoskeletal:        General: Normal range of motion.  Feet:     Left foot:     Toenail Condition: Left toenails are abnormally thick and long. Fungal disease present. Skin:    General: Skin is warm and dry.  Neurological:     Mental Status: She is alert.  Psychiatric:        Mood and Affect: Mood normal.        Behavior: Behavior normal.    Temp (!) 97.1 F (36.2 C) (Temporal)   Ht 5' 4.5" (1.638 m)   Wt 136 lb 3.2 oz (61.8 kg)   BMI 23.02 kg/m  Wt Readings from Last 3 Encounters:  06/17/22 136 lb 3.2 oz (61.8 kg)  04/22/22 137 lb 12.8 oz (62.5 kg)  04/09/22 138 lb (62.6 kg)       Jeanie Sewer, NP

## 2022-06-19 ENCOUNTER — Encounter: Payer: Self-pay | Admitting: Gastroenterology

## 2022-07-07 ENCOUNTER — Ambulatory Visit: Payer: Medicare HMO | Admitting: Podiatry

## 2022-07-20 ENCOUNTER — Ambulatory Visit: Payer: Medicare HMO | Admitting: Podiatry

## 2022-07-20 ENCOUNTER — Encounter: Payer: Self-pay | Admitting: Podiatry

## 2022-07-20 VITALS — BP 132/87 | HR 95

## 2022-07-20 DIAGNOSIS — B351 Tinea unguium: Secondary | ICD-10-CM | POA: Diagnosis not present

## 2022-07-20 DIAGNOSIS — M79675 Pain in left toe(s): Secondary | ICD-10-CM | POA: Diagnosis not present

## 2022-07-20 DIAGNOSIS — G629 Polyneuropathy, unspecified: Secondary | ICD-10-CM

## 2022-07-20 DIAGNOSIS — M79674 Pain in right toe(s): Secondary | ICD-10-CM | POA: Diagnosis not present

## 2022-07-20 NOTE — Progress Notes (Signed)
Complaint:  Visit Type: Patient returns to my office for continued preventative foot care services. Complaint: Patient states" my nails have grown long and thick and become painful to walk and wear shoes" Patient has been diagnosed with Parkinsons  with no foot complications.  Patient has history of bunion surgery both big toe joints.  She also has history of nail removal second toe right foot. The patient presents for preventative foot care services.  Podiatric Exam: Vascular: dorsalis pedis and posterior tibial pulses are palpable bilateral. Capillary return is immediate. Temperature gradient is WNL. Skin turgor WNL  Sensorium: Normal Semmes Weinstein monofilament test. Normal tactile sensation bilaterally. Nail Exam: Pt has thick disfigured discolored nails with subungual debris noted bilateral entire nail hallux through fifth toenails with exception second toenail right foot. Ulcer Exam: There is no evidence of ulcer or pre-ulcerative changes or infection. Orthopedic Exam: Muscle tone and strength are WNL. No limitations in general ROM. No crepitus or effusions noted. Foot type and digits show no abnormalities. Bony prominences are unremarkable. Skin: No Porokeratosis. No infection or ulcers.  Asymptomatic callus sub 1st MPJ left foot.  Diagnosis:  Onychomycosis, , Pain in right toe, pain in left toes  Treatment & Plan Procedures and Treatment: Consent by patient was obtained for treatment procedures.   Debridement of mycotic and hypertrophic toenails, 1 through 5 bilateral and clearing of subungual debris. No ulceration, no infection noted. Patient was told to check with her medical doctor for neuropathy pain since her symptoms are worse at night.  Patient says she has taken gabapentin with causes drowsiness. Return Visit-Office Procedure: Patient instructed to return to the office for a follow up visit 3 months for continued evaluation and treatment.    Helane Gunther DPM

## 2022-07-23 ENCOUNTER — Encounter: Payer: Medicare HMO | Attending: Family Medicine | Admitting: Dietician

## 2022-07-23 ENCOUNTER — Encounter: Payer: Self-pay | Admitting: Dietician

## 2022-07-23 DIAGNOSIS — E119 Type 2 diabetes mellitus without complications: Secondary | ICD-10-CM | POA: Insufficient documentation

## 2022-07-23 NOTE — Patient Instructions (Addendum)
Hold the high fiber cereal for now due to your increased bowel movements.  Hold on nuts.  See if this makes any difference in your symptoms.  Monitor your symptoms.  What makes it worse?  Too much fiber, beans, nuts?  Doesn't seem to make a difference?  Choose egg, toast, fruit OR greek yogurt or cottage cheese, fruit, toast  Continue to stay active :  walk, tai chi class on line, bike, box boxing and (biking for parkinson's)  Continue mindful eating.

## 2022-07-23 NOTE — Progress Notes (Signed)
134  1125-  Patient is here today with her husband. She is complaining of increased bowel movements and some diarrhea for the past couple of days.  She states that she has an appointment to see her gastroenterologist on June 16.   Wt Readings from Last 3 Encounters:  06/17/22 136 lb 3.2 oz (61.8 kg)  04/22/22 137 lb 12.8 oz (62.5 kg)  04/09/22 138 lb (62.6 kg)   Appointment start:  1125 Appointment End:  1205  Patient attended Diabetes Core Classes 1-3 between 04/07/2022 and 04/21/2022 at [facility]. The purpose of the meeting today is to review information learned during those classes as well as review patient application and goals.   What are one or two positive things that you are doing right now to manage your diabetes?  Mindful eating, walking  What is the hardest part about your diabetes right now, causing you the most concern, or is the most worrisome to you about your diabetes?  How to identify the symptoms of diabetes from other things so she is treating herself properly.  What questions do you have today?  Gi questions and general symptom questions for diabetes  Have you participated in any diabetes support group?  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 Exercise:  walks a mile per day,   24 hour diet recall: Breakfast:  Kashi heart cereal, other cereals, nuts, raisins "crunchy nut cereal" recipe, 1% milk, blueberries Snack:  English muffin with peanut butter Lunch:  italian sausage, pepper's, onions, strawberries and pineapple, 1 slice oat bread, slaw Snack:  grapes Dinner:  broiled fish, 1/2 baked sweet potato with butter, vegetable, salad with [place], applesauce Snack:   popcorn, OJ Beverages:  water, OJ, black coffee, rare lite beer   Specific focus but not limited to the following: Review of blood glucose monitoring and interpretation including the recommended target ranges and Hgb A1c.  Review of carbohydrate counting, importance of regularly scheduled meals/snacks, and meal planning to improve quality of diet. Review of the effects of physical activity on glucose levels and long-term glucose control.  Recommended goal of 150 minutes of physical activity/week. Review of patient medications and discussed role of medication on blood glucose and possible side effects. Discussion of strategies to manage stress, psychosocial issues, and other obstacles to diabetes management. Review of short-term complications: hyper- and hypo-glycemia (causes, symptoms, and treatment options) Review of prevention, detection, and treatment of long-term complications.  Discussion of the role of prolonged elevated glucose levels on body systems.  Continuing Goals: Hold the high fiber cereal for now due to your increased bowel movements.  Hold on nuts.  See if this makes any difference in your symptoms.  Monitor your symptoms.  What makes it worse?  Too much fiber, beans, nuts?  Doesn't seem to make a difference?  Choose egg, toast, fruit OR greek yogurt or cottage cheese, fruit, toast  Continue to stay active :  walk, tai chi class on line, bike, box boxing and (biking for parkinson's)  Continue mindful eating.   Future Follow up:  2 months

## 2022-07-25 ENCOUNTER — Ambulatory Visit
Admission: EM | Admit: 2022-07-25 | Discharge: 2022-07-25 | Disposition: A | Discharge status: Home / Self Care | Payer: Medicare HMO | Attending: Urgent Care | Admitting: Urgent Care

## 2022-07-25 DIAGNOSIS — R350 Frequency of micturition: Secondary | ICD-10-CM | POA: Insufficient documentation

## 2022-07-25 DIAGNOSIS — N3001 Acute cystitis with hematuria: Secondary | ICD-10-CM | POA: Diagnosis not present

## 2022-07-25 DIAGNOSIS — R300 Dysuria: Secondary | ICD-10-CM | POA: Diagnosis not present

## 2022-07-25 LAB — POCT URINALYSIS DIP (MANUAL ENTRY)
Bilirubin, UA: NEGATIVE
Glucose, UA: NEGATIVE mg/dL
Ketones, POC UA: NEGATIVE mg/dL
Nitrite, UA: POSITIVE — AB
Protein Ur, POC: 30 mg/dL — AB
Spec Grav, UA: >=1.03 — AB (ref 1.010–1.025)
Urobilinogen, UA: 0.2 E.U./dL
pH, UA: 5.5 (ref 5.0–8.0)

## 2022-07-25 MED ORDER — CEPHALEXIN 500 MG PO CAPS: 500.0000 mg | ORAL_CAPSULE | Freq: Two times a day (BID) | ORAL | 0 refills | Status: DC

## 2022-07-25 NOTE — Discharge Instructions (Addendum)
Please start cephalexin to address an urinary tract infection. Make sure you hydrate very well with plain water and a quantity of 64 ounces of water a day.  Please limit drinks that are considered urinary irritants such as soda, sweet tea, coffee, energy drinks, alcohol.  These can worsen your urinary and genital symptoms but also be the source of them.  I will let you know about your urine culture results through MyChart to see if we need to prescribe or change your antibiotics based off of those results.

## 2022-07-25 NOTE — ED Provider Notes (Signed)
[facility]  Note:  This document was prepared using Conservation officer, historic buildings and may include unintentional dictation errors.  MRN: [id number] DOB: [birth year]  Subjective:   Julia [name] is a 82 y.o. female presenting for acute onset since this morning of dysuria, urinary frequency. Denies fever, n/v, abdominal pain, pelvic pain, rashes, hematuria, vaginal discharge.  Hydrates well with water. Has history of UTIs, last confirmed episode was July 2023. Has tolerated cephalexin in the past.  No current facility-administered medications for this encounter.  Current Outpatient Medications:    Acetaminophen (TYLENOL PO), Take by mouth as needed., Disp: , Rfl:    Calcium Citrate-Vitamin D 315-250 MG-UNIT TABS, Take by mouth., Disp: , Rfl:    carbidopa-levodopa (SINEMET CR) 50-200 MG tablet, Take 1 tablet by mouth 3 (three) times daily., Disp: 270 tablet, Rfl: 3   denosumab (PROLIA) 60 MG/ML SOSY injection, Inject 60 mg into the skin every 6 (six) months., Disp: , Rfl:    diphenhydrAMINE (BENADRYL) 12.5 MG chewable tablet, Chew 12.5 mg by mouth every 6 (six) hours as needed., Disp: , Rfl:    EPINEPHrine 0.3 mg/0.3 mL IJ SOAJ injection, INJECT INTO THE MIDDLE OF THE OUTER THIGH AND HOLD FOR 3 SECONDS AS NEEDED FOR SEVERE ALLERGIC REACTION THEN CALL 911 IF USED., Disp: 2 each, Rfl: 1   fluticasone (FLONASE) 50 MCG/ACT nasal spray, PLACE 2 SPRAYS IN EACH NOSTRIL DAILY AS NEEDED FOR ALLERGIES OR RHINITIS, Disp: 48 g, Rfl: 0   ipratropium (ATROVENT) 0.03 % nasal spray, Place 1-2 sprays into both nostrils 2 (two) times daily as needed (nasal drainage)., Disp: 30 mL, Rfl: 5   levothyroxine (SYNTHROID) 88 MCG tablet, TAKE ONE TABLET BY MOUTH DAILY FOR BREAKFAST, Disp: 90 tablet, Rfl: 3   Melatonin 1 MG CAPS, Take by mouth as needed., Disp: , Rfl:    methocarbamol (ROBAXIN) 500 MG tablet, TAKE 1 TABLET (500 MG TOTAL) BY MOUTH 2 (TWO) TIMES DAILY AS NEEDED FOR MUSCLE SPASMS.,  Disp: 30 tablet, Rfl: 2   Multiple Vitamins-Minerals (CENTRUM SILVER PO), Take by mouth., Disp: , Rfl:    naproxen sodium (ALEVE) 220 MG tablet, Take 220 mg by mouth as needed., Disp: , Rfl:    pramipexole (MIRAPEX) 0.25 MG tablet, TAKE 1 TABLET (0.25 MG TOTAL) BY MOUTH 2 (TWO) TIMES DAILY. TAKE 1 PILL 2 HOURS BEFORE BEDTIME AND 1 PILL AT BEDTIME., Disp: 180 tablet, Rfl: 1   Probiotic Product (PROBIOTIC DAILY PO), Take by mouth., Disp: , Rfl:    SALINE NASAL MIST NA, Place into the nose., Disp: , Rfl:    UNABLE TO FIND, Med Name: Allergy shots every 4 weeks, Disp: , Rfl:    Allergies  Allergen Reactions   Cefdinir    Cholestyramine     GERD   Contrast Media [Iodinated Contrast Media] Diarrhea   Ezetimibe    Meperidine    Nitrofurantoin Diarrhea   Septra [Sulfamethoxazole-Trimethoprim] Diarrhea   Statins    Teriparatide     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   RLS (restless legs syndrome) 07/14/2018   Scoliosis    Secondary malignant neoplasm of unspecified ovary [facility])      Past Surgical  History:  Procedure Laterality Date   ADENOIDECTOMY     BREAST LUMPECTOMY Left    radiation only   BUNIONECTOMY     COLON SURGERY     HEMORRHOID SURGERY     LAPAROSCOPY     NASAL SINUS SURGERY     OOPHORECTOMY     TONSILLECTOMY     TUBAL LIGATION      Family History  Problem Relation Age of Onset   Arthritis Mother    Diabetes Mother    Heart disease Mother    Hypertension Mother    Arthritis Father    Cancer Father    Depression Father    Osteoporosis Father    Arthritis Sister    Cancer Sister    Diabetes Sister    Cancer Sister    Cancer Sister    Hyperlipidemia Sister    Cancer Brother    Hyperlipidemia Brother     Parkinson's disease Neg Hx     Social History   Tobacco Use   Smoking status: Never   Smokeless tobacco: Never  Vaping Use   Vaping Use: Never used  Substance Use Topics   Alcohol use: Yes    Comment: a beer once a month with pizza   Drug use: Never    ROS   Objective:   Vitals: BP 137/85 (BP Location: Right Arm)   Pulse 96   Temp 97.7 F (36.5 C) (Oral)   Resp 18   SpO2 94%   Physical Exam Constitutional:      General: She is not in acute distress.    Appearance: Normal appearance. She is well-developed. She is not ill-appearing, toxic-appearing or diaphoretic.  HENT:     Head: Normocephalic and atraumatic.     Nose: Nose normal.     Mouth/Throat:     Mouth: Mucous membranes are moist.  Eyes:     General: No scleral icterus.       Right eye: No discharge.        Left eye: No discharge.     Extraocular Movements: Extraocular movements intact.     Conjunctiva/sclera: Conjunctivae normal.  Cardiovascular:     Rate and Rhythm: Normal rate.  Pulmonary:     Effort: Pulmonary effort is normal.  Abdominal:     General: Bowel sounds are normal. There is no distension.     Palpations: Abdomen is soft. There is no mass.     Tenderness: There is no abdominal tenderness. There is no right CVA tenderness, left CVA tenderness, guarding or rebound.  Skin:    General: Skin is warm and dry.  Neurological:     General: No focal deficit present.     Mental Status: She is alert and oriented to person, place, and time.  Psychiatric:        Mood and Affect: Mood normal.        Behavior: Behavior normal.        Thought Content: Thought content normal.        Judgment: Judgment normal.     Results for orders placed or performed during the hospital encounter of 07/25/22 (from the past 24 hour(s))  POCT urinalysis dipstick     Status: Abnormal   Collection Time: 07/25/22  3:06 PM  Result Value Ref Range   Color, UA yellow yellow   Clarity, UA clear clear   Glucose, UA  negative negative mg/dL   Bilirubin, UA negative negative   Ketones, POC UA negative negative mg/dL  Spec Grav, UA >=1.030 (A) 1.010 - 1.025   Blood, UA moderate (A) negative   pH, UA 5.5 5.0 - 8.0   Protein Ur, POC =30 (A) negative mg/dL   Urobilinogen, UA 0.2 0.2 or 1.0 E.U./dL   Nitrite, UA Positive (A) Negative   Leukocytes, UA Small (1+) (A) Negative    Assessment and Plan :   PDMP not reviewed this encounter.  1. Acute cystitis with hematuria   2. Dysuria   3. Urinary frequency    Start Keflex to cover for acute cystitis, urine culture pending.  Recommended aggressive hydration, limiting urinary irritants. Counseled patient on potential for adverse effects with medications prescribed/recommended today, ER and return-to-clinic precautions discussed, patient verbalized understanding.    Wallis Bamberg, [place] 07/25/22 [phone]

## 2022-07-25 NOTE — ED Triage Notes (Signed)
Pt reports increase urinary frequency, burning when urinating since this morning.

## 2022-07-27 LAB — URINE CULTURE: Culture: 90000 — AB

## 2022-07-29 DIAGNOSIS — C4490 Unspecified malignant neoplasm of skin, unspecified: Secondary | ICD-10-CM

## 2022-07-29 HISTORY — DX: Unspecified malignant neoplasm of skin, unspecified: C44.90

## 2022-08-07 ENCOUNTER — Other Ambulatory Visit: Payer: Self-pay | Admitting: Neurology

## 2022-08-11 ENCOUNTER — Ambulatory Visit: Payer: Medicare HMO | Admitting: Neurology

## 2022-08-11 ENCOUNTER — Encounter: Payer: Self-pay | Admitting: Neurology

## 2022-08-11 VITALS — BP 122/80 | HR 85 | Ht 64.75 in | Wt 137.0 lb

## 2022-08-11 DIAGNOSIS — G20A2 Parkinson's disease without dyskinesia, with fluctuations: Secondary | ICD-10-CM

## 2022-08-11 MED ORDER — CARBIDOPA-LEVODOPA ER 50-200 MG PO TBCR: 1.0000 | EXTENDED_RELEASE_TABLET | Freq: Four times a day (QID) | ORAL | 3 refills | Status: DC

## 2022-08-11 NOTE — Patient Instructions (Signed)
As discussed, we will increase your long-acting levodopa 50-200 mg strength to 1 pill 4 times a day, at 9 AM, 1 PM, 5 PM and 9 PM daily.  You can continue with pramipexole, 1 pill at 9:30 PM, a second optional pill around 10:30 PM or later. Please follow-up in about 6 months to see Shanda Bumps.  For pain in your right knee/calf or hamstring area you can use topical Voltaren cream, up to 4 times a day as needed.  Do not put Voltaren on any healing scar or open wound.

## 2022-08-11 NOTE — Progress Notes (Signed)
Subjective:    Patient ID: Julia BESA is a 82 y.o. female.  HPI    Interim history:   Julia [name] is a 82 year old right-handed woman with an underlying complex medical history of breast cancer, colon cancer, hyperlipidemia, restless leg syndrome, scoliosis, arthritis, and Parkinson's disease, who presents for follow-up consultation of her parkinsonism.  The patient is unaccompanied today and presents for follow-up. I last saw her on 02/10/2022, at which time she reported feeling fairly stable but did have a fall unfortunately on 01/05/2022.  She was on vacation in [place] and traveling back they stopped overnight in [place], [name].  She tripped and landed on the right side of her face, needed stitches by her right eyebrow. She went to a local ER and had a CTH. Her pain management doctor had provided a prescription for Cymbalta but she never actually took it.  She was afraid of side effects.  She was on Sinemet CR 1 pill 3 times a day at 8, 12 and 4. She was on  pramipexole 0.25 mg at bedtime which is between 9:30 PM and 10 PM and a second optional dose an hour later.   She was advised to continue with her medication regimen.  Today, 08/11/2022: She reports doing about the same, sometimes anxiety triggers her tremor but sometimes anxiety occurs after she takes the levodopa, also when it tends to wear off, she does not have much in the way of other side effects, has been struggling with intermittent diarrhea and takes Imodium as needed.  She does not take a stool softener or laxative currently.  She is scheduled to see GI next month.  She has not fallen.  She does eat dairy, usually half a cup of milk in the morning with her cereal and nearly daily yogurt now.  She was advised to eat yogurt by her nutritionist.  She feels that the tremor flares up in the middle of the night.  She takes pramipexole around 9:30 PM and a second dose at 10:30 PM.  She takes her levodopa, 50-200 mg  strength extended release at 9 AM, 1 PM and 5 PM daily. 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  It is bandaged and she has an appointment coming up in about 2 weeks.  She has had some muscle strain in the back of her right knee, has not fallen, but feels like she may have pulled something.   The patient's allergies, current medications, family history, past medical history, past social history, past surgical history and problem list were reviewed and updated as appropriate.    Previously:   I first met her on 06/23/2021 , as a transfer of care from Julia [name], at which time she reported feeling stable, she was taking Sinemet long-acting 50-200 mg strength 3 times a day.  She was taking pramipexole at bedtime.  She had occasional constipation.   She saw Julia Austin, Julia [name] in the interim on 10/06/2021, at which time she reported occasional vivid dreams.  She reported chronic pain, she was followed by orthopedics.       06/23/21 (SA): She was previously followed by Julia [name] and was last seen by Julia [name] on 11/15/2020.  I reviewed the note and copied the note below for reference. Her Sinemet CR 25-100 mg strength 1 pill 4 times daily was increased to 50-200 mg strength 1 pill 3 times daily at the time.  She was maintained on pramipexole  0.25 mg strength at bedtime for restless leg syndrome.     11/15/2020 (Julia [name]): <<Ms. Kizer is a 82 year old right-handed white female with a history of Parkinson's disease and low back pain.  The patient has a lot of arthritic issues, she wakes up in the morning feeling quite stiff but feels much better when she gets up and moves around.  She has restless leg syndrome at night, but she has gained good improvement with Mirapex.  She is on Sinemet CR taking the 25/100 mg tablets, 1 tablet 4 times daily.  She takes her medications at 7 AM, 11 AM, 3 PM, and 7 PM.  She is still noting some wearing off effect about a half  an hour prior to the next dose of her medication.  In the middle the night, she will wake up, and her arm tremors will keep her from getting back to sleep.  She has chronic issues with constipation, she takes milk of magnesia, she has been on FiberCon but still has some issues.  In the past, she could not tolerate magnesium supplementation secondary to diarrhea.  The patient is remaining active, she will walk at least a mile a day.  She has noted some emotional instability that she attributes to her medication.  She does have some gait instability, she denies any falls but will stumble on occasion.  She denies any issues of significance with swallowing.  She has had some sciatica type pain down the left leg, she is followed through orthopedic surgery and may end up getting an epidural steroid injection in the near future.  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   RLS (restless legs syndrome) 07/14/2018   Scoliosis    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HCC)    Skin cancer 07/2022   removed from R arm    Her Past Surgical History Is Significant For: Past Surgical History:  Procedure Laterality Date   ADENOIDECTOMY     BREAST LUMPECTOMY Left    radiation only   BUNIONECTOMY     COLON SURGERY     HEMORRHOID SURGERY     LAPAROSCOPY     NASAL SINUS SURGERY     OOPHORECTOMY     TONSILLECTOMY     TUBAL LIGATION      Her Family History Is Significant For: Family History  Problem Relation Age of Onset   Arthritis Mother    Diabetes Mother    Heart disease Mother    Hypertension Mother    Arthritis Father    Cancer Father    Depression Father    Osteoporosis Father     Arthritis Sister    Cancer Sister    Diabetes Sister    Cancer Sister    Cancer Sister    Hyperlipidemia Sister    Cancer Brother    Hyperlipidemia Brother    Parkinson's disease Neg Hx     Her Social History Is Significant For: Social History   Socioeconomic History   Marital status: Married    Spouse name: Not on file   Number of children: 3   Years of education: masters +  45 credits   Highest education level: Master's degree (e.g., MA, MS, MEng, MEd, MSW, MBA)  Occupational History   Occupation: Retired Runner, broadcasting/film/video   Tobacco Use   Smoking status: Never   Smokeless tobacco: Never  Vaping Use   Vaping Use: Never used  Substance and Sexual Activity   Alcohol use: Yes    Comment: a beer once a month with pizza   Drug use: Never   Sexual activity: Not Currently  Other Topics Concern   Not on file  Social History Narrative   Right handed    Lives with husband    Caffeine 2 cups daily    Retired Wellsite geologist   Social Determinants of Health   Financial Resource Strain: Low Risk  (03/09/2022)   Overall Financial Resource Strain (CARDIA)    Difficulty of Paying Living Expenses: Not hard at all  Food Insecurity: No Food Insecurity (03/09/2022)   Hunger Vital Sign    Worried About Running Out of Food in the Last Year: Never true    Ran Out of Food in the Last Year: Never true  Transportation Needs: No Transportation Needs (03/09/2022)   PRAPARE - Administrator, Civil Service (Medical): No    Lack of Transportation (Non-Medical): No  Physical Activity: Sufficiently Active (03/09/2022)   Exercise Vital Sign    Days of Exercise per Week: 7 days    Minutes of Exercise per Session: 30 min  Stress: No Stress Concern Present (03/09/2022)   [employer] of Occupational Health - Occupational Stress Questionnaire    Feeling of Stress : Only a little  Social Connections: Socially Integrated (03/09/2022)   Social Connection and Isolation Panel [NHANES]     Frequency of Communication with Friends and Family: More than three times a week    Frequency of Social Gatherings with Friends and Family: More than three times a week    Attends Religious Services: More than 4 times per year    Active Member of [employer] or Organizations: Yes    Attends Banker Meetings: 1 to 4 times per year    Marital Status: Married    Her Allergies Are:  Allergies  Allergen Reactions   Cefdinir    Cholestyramine     GERD   Contrast Media [Iodinated Contrast Media] Diarrhea   Ezetimibe    Meperidine    Nitrofurantoin Diarrhea   Septra [Sulfamethoxazole-Trimethoprim] Diarrhea   Statins    Teriparatide   :   Her Current Medications Are:  Outpatient Encounter Medications as of 08/11/2022  Medication Sig   Acetaminophen (TYLENOL PO) Take by mouth as needed.   Calcium Citrate-Vitamin D 315-250 MG-UNIT TABS Take by mouth.   carbidopa-levodopa (SINEMET CR) 50-200 MG tablet Take 1 tablet by mouth 3 (three) times daily.   clobetasol (TEMOVATE) 0.05 % external solution Apply topically. 2-3 times per week   denosumab (PROLIA) 60 MG/ML SOSY injection Inject 60 mg into the skin every 6 (six) months.   fluticasone (FLONASE) 50 MCG/ACT nasal spray PLACE 2 SPRAYS IN EACH NOSTRIL DAILY AS NEEDED FOR ALLERGIES OR RHINITIS   ipratropium (ATROVENT) 0.03 % nasal spray Place 1-2 sprays into both nostrils 2 (two) times daily as needed (nasal drainage).   ketoconazole (NIZORAL) 2 % shampoo Apply topically.   levothyroxine (SYNTHROID) 88 MCG tablet TAKE ONE TABLET BY MOUTH DAILY FOR BREAKFAST   Multiple Vitamins-Minerals (CENTRUM SILVER PO) Take by mouth.   naproxen sodium (ALEVE) 220 MG tablet Take 220 mg by mouth  as needed.   Polyvinyl Alcohol-Povidone (REFRESH OP) Apply to eye.   pramipexole (MIRAPEX) 0.25 MG tablet TAKE 1 TABLET 2 HOURS BEFORE BEDTIME AND 1 TABLET AT BEDTIME.   Probiotic Product (PROBIOTIC DAILY PO) Take by mouth.   SALINE NASAL MIST NA Place into the  nose.   UNABLE TO FIND Med Name: Allergy shots every 4 weeks   diphenhydrAMINE (BENADRYL) 12.5 MG chewable tablet Chew 12.5 mg by mouth every 6 (six) hours as needed. (Patient not taking: Reported on 08/11/2022)   EPINEPHrine 0.3 mg/0.3 mL IJ SOAJ injection INJECT INTO THE MIDDLE OF THE OUTER THIGH AND HOLD FOR 3 SECONDS AS NEEDED FOR SEVERE ALLERGIC REACTION THEN CALL 911 IF USED. (Patient not taking: Reported on 08/11/2022)   methocarbamol (ROBAXIN) 500 MG tablet TAKE 1 TABLET (500 MG TOTAL) BY MOUTH 2 (TWO) TIMES DAILY AS NEEDED FOR MUSCLE SPASMS. (Patient not taking: Reported on 08/11/2022)   [DISCONTINUED] cephALEXin (KEFLEX) 500 MG capsule Take 1 capsule (500 mg total) by mouth 2 (two) times daily. (Patient not taking: Reported on 08/11/2022)   [DISCONTINUED] Melatonin 1 MG CAPS Take by mouth as needed. (Patient not taking: Reported on 08/11/2022)   No facility-administered encounter medications on file as of 08/11/2022.  :  Review of Systems:  Out of a complete 14 point review of systems, all are reviewed and negative with the exception of these symptoms as listed below:  Review of Systems  Neurological:        Patient is here alone for 6 month follow-up. Patient states her symptoms are little bit worse, particularly the shaking and anxiety. She also notices that she tires more easily. She states her lips intermittently quiver when she drinks. She has some concerns about her medication schedule since some day's activities vary from others. Of note, she will be seeing gastroenterology for problems with bowel movements. She also reports she is having night terrors and believes this to be a side effect of her medication.     Objective:  Neurological Exam  Physical Exam Physical Examination:   Vitals:   08/11/22 1312  BP: 122/80  Pulse: 85    General Examination: The patient is a very pleasant 82 y.o. female in no acute distress. She appears well-developed and well-nourished and well  groomed.   HEENT: Normocephalic, atraumatic, corrective eyeglasses in place.  Pupils are reactive, extraocular tracking fairly well-preserved, speech is clear with mild hypophonia, no obvious dysarthria.  No voice tremor. Face is symmetric with mild to moderate facial masking and decreased eye blink rate.  She has moderate nuchal rigidity and forward flexion of the neck and upper back. L shoulder is higher than right.  She has limitation in neck mobility.  Airway examination reveals mild mouth dryness, small mouth opening, slight nose deformity, stable.     Chest: Clear to auscultation without wheezing, rhonchi or crackles noted.   Heart: S1+S2+0, regular and normal without murmurs, rubs or gallops noted.    Abdomen: Soft, non-tender and non-distended.   Extremities: There is no pitting edema in the distal lower extremities bilaterally.    Skin: Warm and dry without trophic changes noted.    Musculoskeletal: exam reveals scoliosis and increase in upper back curvature, left shoulder higher than right, seems stable.  Bandage right wrist and forearm area.      Neurologically:  Mental status: The patient is awake, alert and oriented in all 4 spheres. Her immediate and remote memory, attention, language skills and fund of knowledge are appropriate. There  is no evidence of aphasia, agnosia, apraxia or anomia. Mood is normal and affect is normal.  Cranial nerves II - XII are as described above under HEENT exam.  Motor exam: Normal bulk, and strength for age, mild increase in tone, no significant dyskinesias currently.  Romberg is not tested for safety reasons.  Fine motor and coordination: She has mild to moderate difficulty with fine motor skills on the right and moderately so on the left.   She stands without difficulty but does push herself up.  Posture is moderately stooped with upper back curvature and scoliosis.  She walks with great caution, no walking aid, decreased arm swing on the left.   Cerebellar testing: No dysmetria or intention tremor. There is no truncal or gait ataxia.  Sensory exam: intact to light touch in the upper and lower extremities.    Assessment and Plan:  In summary, Julia [name] is a very pleasant 82 year old female with an underlying complex medical history of breast cancer, colon cancer, SCC, hyperlipidemia, restless leg syndrome, scoliosis, arthritis, and Parkinson's disease, who presents for follow-up consultation of her parkinsonism with left-sided predominance.  She has been fairly stable.  She has been on levodopa extended release 50-200 mg strength 1 pill 3 times a day.  We have previously talked about the importance of trying to take it without meals.  She is advised to continue with levodopa but increase it to a fourth dose, to take it at 9 AM, 1 PM, 5 PM and 9 PM daily.  She is advised to continue with low-dose pramipexole at 9:30 PM and the second dose around 10:30 PM or later.  She is advised to stay well-hydrated and change positions slowly.  She is supposed to see GI for intermittent diarrhea.  She can use Voltaren cream topically for pain in the hamstring area but is advised not to use it on any healing scar or wound. She is advised to follow-up to see one of our nurse practitioners in 6 months, sooner if needed.  I answered all her questions today and she was in agreement.   I spent 30 minutes in total face-to-face time and in reviewing records during pre-charting, more than 50% of which was spent in counseling and coordination of care, reviewing test results, reviewing medications and treatment regimen and/or in discussing or reviewing the diagnosis of PD, the prognosis and treatment options. Pertinent laboratory and imaging test results that were available during this visit with the patient were reviewed by me and considered in my medical decision making (see chart for details).

## 2022-08-12 ENCOUNTER — Other Ambulatory Visit: Payer: Self-pay | Admitting: Family Medicine

## 2022-08-12 DIAGNOSIS — Z1231 Encounter for screening mammogram for malignant neoplasm of breast: Secondary | ICD-10-CM

## 2022-08-31 ENCOUNTER — Encounter: Payer: Self-pay | Admitting: Physician Assistant

## 2022-08-31 ENCOUNTER — Ambulatory Visit (INDEPENDENT_AMBULATORY_CARE_PROVIDER_SITE_OTHER): Payer: Medicare HMO | Admitting: Physician Assistant

## 2022-08-31 VITALS — BP 118/70 | HR 90 | Temp 98.2°F | Ht 64.75 in | Wt 134.2 lb

## 2022-08-31 DIAGNOSIS — R300 Dysuria: Secondary | ICD-10-CM | POA: Diagnosis not present

## 2022-08-31 LAB — POCT URINALYSIS DIPSTICK
Bilirubin, UA: NEGATIVE
Glucose, UA: NEGATIVE
Ketones, UA: NEGATIVE
Leukocytes, UA: NEGATIVE
Nitrite, UA: NEGATIVE
Protein, UA: NEGATIVE
Spec Grav, UA: 1.02 (ref 1.010–1.025)
Urobilinogen, UA: 0.2 E.U./dL
pH, UA: 6 (ref 5.0–8.0)

## 2022-08-31 MED ORDER — CEPHALEXIN 500 MG PO CAPS: 500.0000 mg | ORAL_CAPSULE | Freq: Two times a day (BID) | ORAL | 0 refills | Status: DC

## 2022-08-31 NOTE — Patient Instructions (Signed)
It was great to see you!  I suspect that you have a urinary tract infection  Start the cephalexin 500 mg twice daily We will call you with your urine culture results and let you know if we need to change the antibiotic!  General instructions Make sure you: Pee until your bladder is empty. Do not hold pee for a long time. Empty your bladder after sex. Wipe from front to back after pooping if you are a female. Use each tissue one time when you wipe. Drink enough fluid to keep your pee pale yellow. Keep all follow-up visits as told by your doctor. This is important. Contact a doctor if: You do not get better after 1-2 days. Your symptoms go away and then come back. Get help right away if: You have very bad back pain. You have very bad pain in your lower belly. You have a fever. You are sick to your stomach (nauseous). You are throwing up.   Take care,  Jarold Motto PA-C

## 2022-08-31 NOTE — Progress Notes (Signed)
Julia [name] is a 82 y.o. female here for a new problem.  History of Present Illness:   Chief Complaint  Patient presents with   Dysuria    Pt c/o burning with urination started Thursday. Denies back pain, no fever or chills.    HPI  Partially treated UTI: Has hx of frequent UTIs. She visited a [place] urgent care on 4/27 and was prescribed 500 mg Cephalexin, twice daily.  On 5/18 she went to a different urgent care at [facility], where she was prescribed Cipro. Following the results of her urine culture, she was called and told to stop Cipro and she was again prescribed Cephalexin.  She continued to take Cipro on the advice of her husband.   Today, she complains of burning with urination and burning sensation on vulva starting Thursday 5/27.  States her symptoms do not feel like her usual UTI symptoms. She used Desitin to help with the burning sensation on her vulva.   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   RLS (restless legs syndrome) 07/14/2018   Scoliosis    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HCC)    Skin cancer 07/2022   removed from R arm     Social History   Tobacco Use   Smoking status: Never   Smokeless tobacco: Never  Vaping Use   Vaping Use: Never used  Substance Use Topics   Alcohol use: Yes    Comment: a beer once a month with pizza   Drug use: Never    Past Surgical History:  Procedure Laterality Date   ADENOIDECTOMY     BREAST LUMPECTOMY Left    radiation only   BUNIONECTOMY     COLON SURGERY     HEMORRHOID SURGERY     LAPAROSCOPY     NASAL SINUS SURGERY     OOPHORECTOMY     TONSILLECTOMY     TUBAL LIGATION      Family History  Problem Relation Age of Onset   Arthritis  Mother    Diabetes Mother    Heart disease Mother    Hypertension Mother    Arthritis Father    Cancer Father    Depression Father    Osteoporosis Father    Arthritis Sister    Cancer Sister    Diabetes Sister    Cancer Sister    Cancer Sister    Hyperlipidemia Sister    Cancer Brother    Hyperlipidemia Brother    Parkinson's disease Neg Hx     Allergies  Allergen Reactions   Cefdinir    Cholestyramine     GERD   Contrast Media [Iodinated Contrast Media] Diarrhea   Ezetimibe    Meperidine    Nitrofurantoin Diarrhea   Septra [Sulfamethoxazole-Trimethoprim] Diarrhea   Statins    Teriparatide     Current Medications:   Current Outpatient Medications:    Acetaminophen (TYLENOL PO), Take by mouth as needed., Disp: , Rfl:    Calcium Citrate-Vitamin D 315-250 MG-UNIT TABS, Take by mouth., Disp: , Rfl:    carbidopa-levodopa (SINEMET CR) 50-200 MG tablet, Take 1 tablet by mouth in the morning, at noon, in the evening, and at bedtime. 9  AM, 1 PM, 5 PM and 9 PM daily. (Patient taking differently: Take 1 tablet by mouth in the morning, at noon, in the evening, and at bedtime. 9 AM, 1 PM, 5 PM daily.), Disp: 360 tablet, Rfl: 3   cephALEXin (KEFLEX) 500 MG capsule, Take 1 capsule (500 mg total) by mouth 2 (two) times daily., Disp: 14 capsule, Rfl: 0   clobetasol (TEMOVATE) 0.05 % external solution, Apply topically. 2-3 times per week, Disp: , Rfl:    denosumab (PROLIA) 60 MG/ML SOSY injection, Inject 60 mg into the skin every 6 (six) months., Disp: , Rfl:    diphenhydrAMINE (BENADRYL) 12.5 MG chewable tablet, Chew 12.5 mg by mouth every 6 (six) hours as needed., Disp: , Rfl:    fluticasone (FLONASE) 50 MCG/ACT nasal spray, PLACE 2 SPRAYS IN EACH NOSTRIL DAILY AS NEEDED FOR ALLERGIES OR RHINITIS, Disp: 48 g, Rfl: 0   ipratropium (ATROVENT) 0.03 % nasal spray, Place 1-2 sprays into both nostrils 2 (two) times daily as needed (nasal drainage)., Disp: 30 mL, Rfl: 5   ketoconazole (NIZORAL)  2 % shampoo, Apply topically., Disp: , Rfl:    levothyroxine (SYNTHROID) 88 MCG tablet, TAKE ONE TABLET BY MOUTH DAILY FOR BREAKFAST, Disp: 90 tablet, Rfl: 3   Multiple Vitamins-Minerals (CENTRUM SILVER PO), Take by mouth., Disp: , Rfl:    naproxen sodium (ALEVE) 220 MG tablet, Take 220 mg by mouth as needed., Disp: , Rfl:    Polyvinyl Alcohol-Povidone (REFRESH OP), Apply to eye., Disp: , Rfl:    pramipexole (MIRAPEX) 0.25 MG tablet, TAKE 1 TABLET 2 HOURS BEFORE BEDTIME AND 1 TABLET AT BEDTIME., Disp: 180 tablet, Rfl: 1   Probiotic Product (PROBIOTIC DAILY PO), Take by mouth., Disp: , Rfl:    SALINE NASAL MIST NA, Place into the nose., Disp: , Rfl:    Review of Systems:   Review of Systems  Genitourinary:  Positive for dysuria (burning).    Vitals:   Vitals:   08/31/22 1142  BP: 118/70  Pulse: 90  Temp: 98.2 F (36.8 C)  TempSrc: Temporal  SpO2: 98%  Weight: 134 lb 4 oz (60.9 kg)  Height: 5' 4.75" (1.645 m)     Body mass index is 22.51 kg/m.  Physical Exam:   Physical Exam Vitals and nursing note reviewed.  Constitutional:      General: She is not in acute distress.    Appearance: She is well-developed. She is not ill-appearing or toxic-appearing.  Cardiovascular:     Rate and Rhythm: Normal rate and regular rhythm.     Pulses: Normal pulses.     Heart sounds: Normal heart sounds, S1 normal and S2 normal.  Pulmonary:     Effort: Pulmonary effort is normal.     Breath sounds: Normal breath sounds.  Abdominal:     Tenderness: There is no right CVA tenderness or left CVA tenderness.  Skin:    General: Skin is warm and dry.  Neurological:     Mental Status: She is alert.     GCS: GCS eye subscore is 4. GCS verbal subscore is 5. GCS motor subscore is 6.  Psychiatric:        Speech: Speech normal.        Behavior: Behavior normal. Behavior is cooperative.    Results for orders placed or performed in visit on 08/31/22  POCT urinalysis dipstick  Result Value Ref  Range   Color, UA Yellow    Clarity, UA clear    Glucose, UA Negative  Negative   Bilirubin, UA Negative    Ketones, UA Negative    Spec Grav, UA 1.020 1.010 - 1.025   Blood, UA trace    pH, UA 6.0 5.0 - 8.0   Protein, UA Negative Negative   Urobilinogen, UA 0.2 0.2 or 1.0 E.U./dL   Nitrite, UA Negative    Leukocytes, UA Negative Negative   Appearance     Odor       Assessment and Plan:   Dysuria No red flags UA with blood; history concerning for partially treated urinary tract infection Start keflex as prescribed Urine culture ordered -- will adjust treatment(s) as indicated Worsening precautions advised   I,Rachel Rivera,acting as a scribe for [employer], PA.,have documented all relevant documentation on the behalf of Jarold Motto, PA,as directed by  Jarold Motto, PA while in the presence of Jarold Motto, [place].  I, Jarold Motto, [place], have reviewed all documentation for this visit. The documentation on 08/31/22 for the exam, diagnosis, procedures, and orders are all accurate and complete.   Jarold Motto, PA-C

## 2022-09-01 LAB — URINE CULTURE
MICRO NUMBER:: 15033009
Result:: NO GROWTH
SPECIMEN QUALITY:: ADEQUATE

## 2022-09-16 ENCOUNTER — Ambulatory Visit: Payer: Medicare HMO | Admitting: Gastroenterology

## 2022-09-24 ENCOUNTER — Ambulatory Visit
Admission: RE | Admit: 2022-09-24 | Discharge: 2022-09-24 | Disposition: A | Discharge status: Home / Self Care | Payer: Medicare HMO | Source: Ambulatory Visit | Attending: Family Medicine | Admitting: Family Medicine

## 2022-09-24 DIAGNOSIS — Z1231 Encounter for screening mammogram for malignant neoplasm of breast: Secondary | ICD-10-CM

## 2022-09-29 ENCOUNTER — Other Ambulatory Visit: Payer: Self-pay | Admitting: Allergy

## 2022-09-30 ENCOUNTER — Other Ambulatory Visit: Payer: Self-pay | Admitting: Family Medicine

## 2022-10-14 ENCOUNTER — Ambulatory Visit: Payer: Medicare HMO | Admitting: Family

## 2022-10-14 VITALS — BP 124/82 | HR 92 | Ht 64.75 in | Wt 134.0 lb

## 2022-10-14 DIAGNOSIS — N390 Urinary tract infection, site not specified: Secondary | ICD-10-CM

## 2022-10-14 DIAGNOSIS — N3090 Cystitis, unspecified without hematuria: Secondary | ICD-10-CM

## 2022-10-14 DIAGNOSIS — N898 Other specified noninflammatory disorders of vagina: Secondary | ICD-10-CM | POA: Diagnosis not present

## 2022-10-14 LAB — POCT URINALYSIS DIPSTICK
Bilirubin, UA: NEGATIVE
Blood, UA: POSITIVE — AB
Glucose, UA: NEGATIVE
Ketones, UA: NEGATIVE
Nitrite, UA: NEGATIVE
Protein, UA: NEGATIVE
Spec Grav, UA: 1.015 (ref 1.010–1.025)
Urobilinogen, UA: 0.2 E.U./dL
pH, UA: 6 (ref 5.0–8.0)

## 2022-10-14 MED ORDER — CEPHALEXIN 500 MG PO CAPS
500.0000 mg | ORAL_CAPSULE | Freq: Two times a day (BID) | ORAL | 0 refills | Status: AC
Start: 2022-10-14 — End: 2022-10-21

## 2022-10-14 MED ORDER — FLUCONAZOLE 150 MG PO TABS
ORAL_TABLET | ORAL | 0 refills | Status: DC
Start: 2022-10-14 — End: 2023-03-15

## 2022-10-14 NOTE — Patient Instructions (Signed)
It was very nice to see you today!   I have sent over generic Keflex for your UTI. Start this today. I also sent over Fluconazole which treats a yeast infection that sometimes is brought on by taking the antibiotic.  I also have sent over a referral for our Urogynecology office.      PLEASE NOTE:  If you had any lab tests please let us know if you have not heard back within a few days. You may see your results on MyChart before we have a chance to review them but we will give you a call once they are reviewed by [place]. If we ordered any referrals today, please let us know if you have not heard from their office within the next week.

## 2022-10-14 NOTE — Progress Notes (Signed)
Patient ID: Julia [name], female    DOB: [birth year], 82 y.o.   MRN: [id number]  Chief Complaint  Patient presents with   vaginal irritation    Pt c/o vaginal irritation and uncomfortable, present for about a week.     HPI:      Vaginal irritation:  dysuria, frequency, urgency, lower pelvic pain. Sx for 1 week. Denies hematuria, fever, low back pain or discharge. Last UTI in April, but treated last month d/t having sx and risk of possible incomplete tx. Pt concerned if her prolapsed bladder is increasing her risk for repeat UTIs.   Assessment & Plan:  1. Cystitis sending keflix, pt tolerated last time, will send out for culture also. Advised pt on increased water intake.  - cephALEXin (KEFLEX) 500 MG capsule; Take 1 capsule (500 mg total) by mouth 2 (two) times daily for 7 days. Take for 7 days  Dispense: 14 capsule; Refill: 0 - POCT Urinalysis Dipstick - Urine Culture  2. Vaginal irritation sending Diflucan, pt concerned if she has yeast already, and possibly could worsen w/abt. Advised on use.  - fluconazole (DIFLUCAN) 150 MG tablet; Take 1 pill tomorrow and the 2nd pill 3 days later.  Dispense: 2 tablet; Refill: 0  3. Frequent UTI   - Ambulatory referral to Urogynecology   Subjective:    Outpatient Medications Prior to Visit  Medication Sig Dispense Refill   Acetaminophen (TYLENOL PO) Take by mouth as needed.     Calcium Citrate-Vitamin D 315-250 MG-UNIT TABS Take by mouth.     carbidopa-levodopa (SINEMET CR) 50-200 MG tablet Take 1 tablet by mouth in the morning, at noon, in the evening, and at bedtime. 9 AM, 1 PM, 5 PM and 9 PM daily. (Patient taking differently: Take 1 tablet by mouth in the morning, at noon, in the evening, and at bedtime. 9 AM, 1 PM, 5 PM daily.) 360 tablet 3   denosumab (PROLIA) 60 MG/ML SOSY injection Inject 60 mg into the skin every 6 (six) months.     diphenhydrAMINE (BENADRYL) 12.5 MG chewable tablet Chew 12.5 mg by mouth every 6 (six) hours as  needed.     fluticasone (FLONASE) 50 MCG/ACT nasal spray PLACE 2 SPRAYS IN EACH NOSTRIL DAILY AS NEEDED FOR ALLERGIES OR RHINITIS 48 g 0   levothyroxine (SYNTHROID) 88 MCG tablet TAKE ONE TABLET BY MOUTH DAILY FOR BREAKFAST 90 tablet 3   Multiple Vitamins-Minerals (CENTRUM SILVER PO) Take by mouth.     naproxen sodium (ALEVE) 220 MG tablet Take 220 mg by mouth as needed.     Polyvinyl Alcohol-Povidone (REFRESH OP) Apply to eye.     pramipexole (MIRAPEX) 0.25 MG tablet TAKE 1 TABLET 2 HOURS BEFORE BEDTIME AND 1 TABLET AT BEDTIME. 180 tablet 1   Probiotic Product (PROBIOTIC DAILY PO) Take by mouth.     SALINE NASAL MIST NA Place into the nose.     cephALEXin (KEFLEX) 500 MG capsule Take 1 capsule (500 mg total) by mouth 2 (two) times daily. (Patient not taking: Reported on 10/14/2022) 14 capsule 0   clobetasol (TEMOVATE) 0.05 % external solution Apply topically. 2-3 times per week (Patient not taking: Reported on 10/14/2022)     ipratropium (ATROVENT) 0.03 % nasal spray PLACE 1-2 SPRAYS INTO BOTH NOSTRILS 2 (TWO) TIMES DAILY AS NEEDED (NASAL DRAINAGE). (Patient not taking: Reported on 10/14/2022) 30 mL 5   ketoconazole (NIZORAL) 2 % shampoo Apply topically. (Patient not taking: Reported on 10/14/2022)     No  facility-administered medications prior to visit.   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   RLS (restless legs syndrome) 07/14/2018   Scoliosis    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HCC)    Skin cancer 07/2022   removed from R arm   Past Surgical History:  Procedure Laterality Date   ADENOIDECTOMY     BREAST LUMPECTOMY Left    radiation only   BUNIONECTOMY     COLON SURGERY     HEMORRHOID SURGERY     LAPAROSCOPY     NASAL  SINUS SURGERY     OOPHORECTOMY     TONSILLECTOMY     TUBAL LIGATION     Allergies  Allergen Reactions   Cefdinir    Cholestyramine     GERD   Contrast Media [Iodinated Contrast Media] Diarrhea   Ezetimibe    Meperidine    Nitrofurantoin Diarrhea   Septra [Sulfamethoxazole-Trimethoprim] Diarrhea   Statins    Teriparatide       Objective:    Physical Exam Vitals and nursing note reviewed.  Constitutional:      Appearance: Normal appearance.  Cardiovascular:     Rate and Rhythm: Normal rate and regular rhythm.  Pulmonary:     Effort: Pulmonary effort is normal.     Breath sounds: Normal breath sounds.  Musculoskeletal:        General: Normal range of motion.  Skin:    General: Skin is warm and dry.  Neurological:     Mental Status: She is alert.  Psychiatric:        Mood and Affect: Mood normal.        Behavior: Behavior normal.    There were no vitals taken for this visit. Wt Readings from Last 3 Encounters:  08/31/22 134 lb 4 oz (60.9 kg)  08/11/22 137 lb (62.1 kg)  06/17/22 136 lb 3.2 oz (61.8 kg)       Dulce Sellar, NP

## 2022-10-17 LAB — URINE CULTURE
MICRO NUMBER:: 15212025
SPECIMEN QUALITY:: ADEQUATE

## 2022-10-18 ENCOUNTER — Other Ambulatory Visit: Payer: Self-pay | Admitting: Neurology

## 2022-10-21 ENCOUNTER — Other Ambulatory Visit: Payer: Self-pay | Admitting: *Deleted

## 2022-10-21 MED ORDER — CARBIDOPA-LEVODOPA ER 50-200 MG PO TBCR: 1.0000 | EXTENDED_RELEASE_TABLET | Freq: Four times a day (QID) | ORAL | 3 refills | Status: DC

## 2022-10-31 ENCOUNTER — Encounter: Payer: Self-pay | Admitting: Family

## 2022-12-15 ENCOUNTER — Ambulatory Visit (INDEPENDENT_AMBULATORY_CARE_PROVIDER_SITE_OTHER): Payer: Medicare HMO | Admitting: Dermatology

## 2022-12-15 ENCOUNTER — Encounter: Payer: Self-pay | Admitting: Dermatology

## 2022-12-15 VITALS — BP 151/106 | HR 70

## 2022-12-15 DIAGNOSIS — Z85038 Personal history of other malignant neoplasm of large intestine: Secondary | ICD-10-CM

## 2022-12-15 DIAGNOSIS — L720 Epidermal cyst: Secondary | ICD-10-CM

## 2022-12-15 DIAGNOSIS — L570 Actinic keratosis: Secondary | ICD-10-CM | POA: Diagnosis not present

## 2022-12-15 DIAGNOSIS — L821 Other seborrheic keratosis: Secondary | ICD-10-CM | POA: Diagnosis not present

## 2022-12-15 DIAGNOSIS — L814 Other melanin hyperpigmentation: Secondary | ICD-10-CM

## 2022-12-15 DIAGNOSIS — Z1283 Encounter for screening for malignant neoplasm of skin: Secondary | ICD-10-CM

## 2022-12-15 DIAGNOSIS — D1801 Hemangioma of skin and subcutaneous tissue: Secondary | ICD-10-CM | POA: Diagnosis not present

## 2022-12-15 DIAGNOSIS — L738 Other specified follicular disorders: Secondary | ICD-10-CM

## 2022-12-15 DIAGNOSIS — W908XXA Exposure to other nonionizing radiation, initial encounter: Secondary | ICD-10-CM

## 2022-12-15 DIAGNOSIS — Z8543 Personal history of malignant neoplasm of ovary: Secondary | ICD-10-CM

## 2022-12-15 DIAGNOSIS — L578 Other skin changes due to chronic exposure to nonionizing radiation: Secondary | ICD-10-CM

## 2022-12-15 DIAGNOSIS — Z853 Personal history of malignant neoplasm of breast: Secondary | ICD-10-CM

## 2022-12-15 DIAGNOSIS — D229 Melanocytic nevi, unspecified: Secondary | ICD-10-CM

## 2022-12-15 DIAGNOSIS — Z85828 Personal history of other malignant neoplasm of skin: Secondary | ICD-10-CM | POA: Insufficient documentation

## 2022-12-15 NOTE — Patient Instructions (Addendum)
Hello Julia Dandy,  Thank you for visiting [place] today. We appreciate your commitment to maintaining your skin health, especially considering your history with skin cancer. Here is a summary of the key instructions from today's consultation:  - Cryotherapy Treatment: Your left upper shoulder's hyperkeratotic pink papule was treated with liquid nitrogen today.    - Post-Treatment Care: Please apply Vaseline to the treated area twice a day for the next two weeks.    - Monitoring: Watch the treated spot for crusting and natural shedding. If the lesion does not resolve, we ask that you return for a follow-up treatment.    - Sun Protection: It's crucial to continue using sunscreen regularly.    - Self-Exams: We advise performing routine self-exams and reporting any new or changing moles or unhealed lesions.    - Next Appointment: We recommend scheduling a follow-up for routine skin cancer screening in one year.  We are here to support you in your journey towards better skin health. If you have any questions or concerns before your next appointment, please do not hesitate to contact our office.  Warm regards,  Dr. Langston Reusing,  Dermatology    Cryotherapy Aftercare  Wash gently with soap and water everyday.   Apply Vaseline and Band-Aid daily until healed.   Important Information   Due to recent changes in healthcare laws, you may see results of your pathology and/or laboratory studies on MyChart before the doctors have had a chance to review them. We understand that in some cases there may be results that are confusing or concerning to you. Please understand that not all results are received at the same time and often the doctors may need to interpret multiple results in order to provide you with the best plan of care or course of treatment. Therefore, we ask that you please give [place] 2 business days to thoroughly review all your results before contacting the office for clarification. Should we see  a critical lab result, you will be contacted sooner.     If You Need Anything After Your Visit   If you have any questions or concerns for your doctor, please call our main line at [phone]. If no one answers, please leave a voicemail as directed and we will return your call as soon as possible. Messages left after 4 pm will be answered the following business day.    You may also send [place] a message via MyChart. We typically respond to MyChart messages within 1-2 business days.  For prescription refills, please ask your pharmacy to contact our office. Our fax number is [phone].  If you have an urgent issue when the clinic is closed that cannot wait until the next business day, you can page your doctor at the number below.     Please note that while we do our best to be available for urgent issues outside of office hours, we are not available 24/7.    If you have an urgent issue and are unable to reach [place], you may choose to seek medical care at your doctor's office, retail clinic, urgent care center, or emergency room.   If you have a medical emergency, please immediately call 911 or go to the emergency department. In the event of inclement weather, please call our main line at [phone] for an update on the status of any delays or closures.  Dermatology Medication Tips: Please keep the boxes that topical medications come in in order to help keep track of the instructions about  where and how to use these. Pharmacies typically print the medication instructions only on the boxes and not directly on the medication tubes.   If your medication is too expensive, please contact our office at [phone] or send [place] a message through MyChart.    We are unable to tell what your co-pay for medications will be in advance as this is different depending on your insurance coverage. However, we may be able to find a substitute medication at lower cost or fill out paperwork to get insurance to  cover a needed medication.    If a prior authorization is required to get your medication covered by your insurance company, please allow [place] 1-2 business days to complete this process.   Drug prices often vary depending on where the prescription is filled and some pharmacies may offer cheaper prices.   The website www.goodrx.com contains coupons for medications through different pharmacies. The prices here do not account for what the cost may be with help from insurance (it may be cheaper with your insurance), but the website can give you the price if you did not use any insurance.  - You can print the associated coupon and take it with your prescription to the pharmacy.  - You may also stop by our office during regular business hours and pick up a GoodRx coupon card.  - If you need your prescription sent electronically to a different pharmacy, notify our office through [facility] or by phone at [phone]

## 2022-12-15 NOTE — Progress Notes (Signed)
   New Patient Visit   Subjective  Julia [name] is a 82 y.o. female who presents for the following:  Total Body Skin Exam (TBSE)  Patient present today for new patient visit for TBSE.The patient reports she has spots, moles and lesions to be evaluated, some may be new or changing and the patient may have concern these could be cancer. Patient has previously been treated by a dermatologist.Patient reports she has hx of bx. Patient denies family history of skin cancers. Patient reports throughout her lifetime has had moderate sun exposure. Currently, patient reports if she has excessive sun exposure, she does apply sunscreen and/or wears protective coverings. Patient reports hx of colon, Breat and Ovary cancers.  The following portions of the chart were reviewed this encounter and updated as appropriate: medications, allergies, medical history  Review of Systems:  No other skin or systemic complaints except as noted in HPI or Assessment and Plan.  Objective  Well appearing patient in no apparent distress; mood and affect are within normal limits.  A full examination was performed including scalp, head, eyes, ears, nose, lips, neck, chest, axillae, abdomen, back, buttocks, bilateral upper extremities, bilateral lower extremities, hands, feet, fingers, toes, fingernails, and toenails. All findings within normal limits unless otherwise noted below.   Relevant exam findings are noted in the Assessment and Plan.    Assessment & Plan   LENTIGINES, SEBORRHEIC KERATOSES, HEMANGIOMAS - Benign normal skin lesions - Benign-appearing - Call for any changes  MELANOCYTIC NEVI - Tan-brown and/or pink-flesh-colored symmetric macules and papules - Benign appearing on exam today - Observation - Call clinic for new or changing moles - Recommend daily use of broad spectrum spf 30+ sunscreen to sun-exposed areas.   ACTINIC DAMAGE - Chronic condition, secondary to cumulative UV/sun exposure - diffuse  scaly erythematous macules with underlying dyspigmentation - Recommend daily broad spectrum sunscreen SPF 30+ to sun-exposed areas, reapply every 2 hours as needed.  - Staying in the shade or wearing long sleeves, sun glasses (UVA+UVB protection) and wide brim hats (4-inch brim around the entire circumference of the hat) are also recommended for sun protection.  - Call for new or changing lesions.  Sebaceous Hyperplasia on Forehead - Small yellow papules with a central dell - Benign-appearing - Observe. Call for changes.  Milia - tiny firm white papules - type of cyst - benign - sometimes these will clear with nightly OTC adapalene/Differin 0.1% gel or retinol. - may be extracted if symptomatic - observe   SKIN CANCER SCREENING PERFORMED TODAY   No follow-ups on file.   Documentation: I have reviewed the above documentation for accuracy and completeness, and I agree with the above.  Langston Reusing, DO

## 2023-01-27 ENCOUNTER — Ambulatory Visit: Payer: Medicare HMO | Admitting: Obstetrics and Gynecology

## 2023-01-27 ENCOUNTER — Other Ambulatory Visit (HOSPITAL_COMMUNITY)
Admission: RE | Admit: 2023-01-27 | Discharge: 2023-01-27 | Disposition: A | Discharge status: Home / Self Care | Payer: Medicare HMO | Source: Other Acute Inpatient Hospital | Attending: Obstetrics and Gynecology | Admitting: Obstetrics and Gynecology

## 2023-01-27 ENCOUNTER — Encounter: Payer: Self-pay | Admitting: Obstetrics and Gynecology

## 2023-01-27 VITALS — BP 139/79 | HR 78 | Ht 62.0 in | Wt 133.0 lb

## 2023-01-27 DIAGNOSIS — R159 Full incontinence of feces: Secondary | ICD-10-CM | POA: Diagnosis not present

## 2023-01-27 DIAGNOSIS — R350 Frequency of micturition: Secondary | ICD-10-CM | POA: Diagnosis present

## 2023-01-27 DIAGNOSIS — R82998 Other abnormal findings in urine: Secondary | ICD-10-CM | POA: Diagnosis not present

## 2023-01-27 DIAGNOSIS — N390 Urinary tract infection, site not specified: Secondary | ICD-10-CM

## 2023-01-27 DIAGNOSIS — Z87440 Personal history of urinary (tract) infections: Secondary | ICD-10-CM

## 2023-01-27 DIAGNOSIS — N952 Postmenopausal atrophic vaginitis: Secondary | ICD-10-CM

## 2023-01-27 LAB — POCT URINALYSIS DIPSTICK
Bilirubin, UA: NEGATIVE
Glucose, UA: NEGATIVE
Ketones, UA: NEGATIVE
Nitrite, UA: NEGATIVE
Protein, UA: NEGATIVE
Spec Grav, UA: 1.01 (ref 1.010–1.025)
Urobilinogen, UA: 0.2 U/dL
pH, UA: 6 (ref 5.0–8.0)

## 2023-01-27 LAB — URINALYSIS, COMPLETE (UACMP) WITH MICROSCOPIC
Bilirubin Urine: NEGATIVE
Glucose, UA: NEGATIVE mg/dL
Hgb urine dipstick: NEGATIVE
Ketones, ur: 5 mg/dL — AB
Nitrite: NEGATIVE
Protein, ur: NEGATIVE mg/dL
Specific Gravity, Urine: 1.016 (ref 1.005–1.030)
WBC, UA: >50 WBC/hpf (ref 0–5)
pH: 6 (ref 5.0–8.0)

## 2023-01-27 MED ORDER — ESTRADIOL 0.1 MG/GM VA CREA: TOPICAL_CREAM | VAGINAL | 11 refills | Status: DC

## 2023-01-27 NOTE — Patient Instructions (Addendum)
Accidental Bowel Leakage: Our goal is to achieve formed bowel movements daily or every-other-day without leakage.  You may need to try different combinations of the following options to find what works best for you.  Some management options include: Dietary changes (more leafy greens, vegetables and fruits; less processed foods) Fiber supplementation (Metamucil or something with psyllium as active ingredient) Over-the-counter imodium (tablets or liquid) to help solidify the stool and prevent leakage of stool.  If you get constipated you can use Miralax as needed to achieve bowel movements.   Start vaginal estrogen therapy 2 times weekly at night for prevention of infections. Use a pea sized amount and place at opening of the vagina. Call for any issues.   Vulvovaginal moisturizer Options: Vitamin E oil (pump or capsule) or cream (Gene's Vit E Cream) Coconut oil Silicone-based lubricant for use during intercourse ("uberlube" or "wet platinum" is a brand available at most drugstores) Crisco Consider the ingredients of the product - the fewer the ingredients the better!  Directions for Use: Clean and dry your hands Gently dab the vulvar/vaginal area dry as needed Apply a "pea-sized" amount of the moisturizer onto your fingertip Using you other hand, open the labia  Apply the moisturizer to the vulvar/vaginal tissues Wear loose fitting underwear/clothing if possible following application Use moisturize up to 3 times daily as desired.
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
## 2023-01-30 LAB — URINE CULTURE: Culture: >=100000 — AB

## 2023-02-01 MED ORDER — AMOXICILLIN 500 MG PO CAPS: 500.0000 mg | ORAL_CAPSULE | Freq: Two times a day (BID) | ORAL | 0 refills | Status: AC

## 2023-02-01 NOTE — Progress Notes (Signed)
Patient has been notified

## 2023-02-01 NOTE — Addendum Note (Signed)
Addended by: Marguerita Beards on: 02/01/2023 12:17 PM   Modules accepted: Orders

## 2023-02-07 ENCOUNTER — Other Ambulatory Visit: Payer: Self-pay | Admitting: Neurology

## 2023-02-23 ENCOUNTER — Ambulatory Visit: Payer: Medicare HMO | Admitting: Adult Health

## 2023-02-23 ENCOUNTER — Encounter: Payer: Self-pay | Admitting: Adult Health

## 2023-02-23 VITALS — BP 137/74 | HR 91 | Ht 64.0 in | Wt 135.0 lb

## 2023-02-23 DIAGNOSIS — G20A2 Parkinson's disease without dyskinesia, with fluctuations: Secondary | ICD-10-CM | POA: Diagnosis not present

## 2023-02-23 MED ORDER — CARBIDOPA-LEVODOPA ER 50-200 MG PO TBCR: 1.0000 | EXTENDED_RELEASE_TABLET | Freq: Four times a day (QID) | ORAL | 3 refills | Status: DC

## 2023-02-23 NOTE — Patient Instructions (Addendum)
Your Plan:  Recommend trying increased dose of Sinemet 1 tablet 4 times daily - start at 7am, then repeat at 11am, 3pm and 7pm  Continue pramipexole at current dosage, try to take 1 hour prior to bedtime and repeat tablet at bedtime  Try melatonin at night to help with dreams and movements during sleep, sounds like REM behavior disorder which can be seen in Parkinson's Disease, can also help with insomnia    Please ensure you speak with your PCP regarding anxiety and hair loss concerns      Follow up with Dr. Frances Furbish in 6 months or call earlier if needed     Thank you for coming to see [place] at [facility]. I hope we have been able to provide you high quality care today.  You may receive a patient satisfaction survey over the next few weeks. We would appreciate your feedback and comments so that we may continue to improve ourselves and the health of our patients.

## 2023-02-23 NOTE — Progress Notes (Signed)
[facility] [address] [place]. [place] [address] [phone]       OFFICE FOLLOW UP NOTE  [name] Date of Birth:  January 02, 1941 Medical Record Number:  [id number]    Primary neurologist: Dr. Frances Furbish Reason for visit: Parkinson's disease    SUBJECTIVE:   CHIEF COMPLAINT:  Chief Complaint  Patient presents with   Follow-up    Patient in room #2 and alone. Patient states thing are getting hard because she knows when her Rx meds has wore off. Patient states she been feeling fatigue lately.    HPI:     Julia [name] is a 82 year old right-handed woman with an underlying complex medical history of breast cancer, colon cancer, hyperlipidemia, restless leg syndrome, scoliosis, arthritis, and Parkinson's disease, who presents for follow-up consultation of her parkinsonism.  The patient is unaccompanied today and presents for follow-up.  She was previously followed by Dr. Stephanie Acre and transferred care to Dr. Frances Furbish with initial visit 06/23/2021.  Her Sinemet CR 25-100 mg strength 1 pill 4 times daily was increased to 50-200 mg strength 1 pill 3 times daily at the time.  She was maintained on pramipexole 0.25 mg strength at bedtime for restless leg syndrome.     Update 02/23/2023 JM: At prior visit with Dr. Frances Furbish, Sinemet CR dosage increased to 1 tab 4 times daily and continued pramipexole at 930pm and 1030pm (or later).   Reports she never increased her Sinemet as she is fearful of depending on medications. She does continue to have good days and bad days, some decline since prior visit. Difficulty with timing of medications. Will have wearing off effects about 30 minutes prior to next dose especially in the evening. Currently, tries to take Sinemet at 7 AM, 11 AM and 5 PM. Will experience sensation of being very warm and then cold right after taking Sinemet, does not last long. She does c/o hair loss, questions if due to Sinemet. Ambulates without AD, no  recent falls. Able to maintain ADLs independently. Use of pramipexole 1 tab at bedtime, will repeat dose if RLS symptoms worsen, usually about 1 hour later. Can wake up in the middle of the night with legs shaking at times. Notes have abnormal dreams and night terrors, husband mentions she can yell in her sleep and will act out her dreams. Notes worsening anxiety especially with social situations, she reports this is a newer issue and has never had anxiety issues prior but per review of prior OV notes, this has been discussed previously at least over the past 2 years.  Chronic neuropathy symptoms stable. Previously discussed these similar symptoms at prior visits.  Notes gradual decline in vision, more of a blurred vision, seen by ophthalmology and prescribed new prescription glasses but needs to wait until next year to obtain due to insurance.       History provided for reference purposes only Update 08/11/2022 Dr. Frances Furbish: She reports doing about the same, sometimes anxiety triggers her tremor but sometimes anxiety occurs after she takes the levodopa, also when it tends to wear off, she does not have much in the way of other side effects, has been struggling with intermittent diarrhea and takes Imodium as needed.  She does not take a stool softener or laxative currently.  She is scheduled to see GI next month.  She has not fallen.  She does eat dairy, usually half a cup of milk in the morning with her cereal and nearly daily yogurt now.  She was advised to eat yogurt by her nutritionist.  She feels that the tremor flares up in the middle of the night.  She takes pramipexole around 9:30 PM and a second dose at 10:30 PM.  She takes her levodopa, 50-200 mg strength extended release at 9 AM, 1 PM and 5 PM daily. 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  It is bandaged and she has an appointment coming up in about 2 weeks.  She has had some muscle strain in the back of her  right knee, has not fallen, but feels like she may have pulled something.  Update 02/10/2022 JM: She reports feeling fairly stable but did have a fall unfortunately on 01/05/2022.  She was on vacation in [place] and traveling back they stopped overnight in [name], in [place].  She tripped at a curb and landed on the right side of her face, she needed stitches by her right eyebrow, she reports that she had a CT scan of the head at the time, went to the ER via ambulance.  She has had more anxiety symptoms.  Her pain management doctor provided a prescription for Cymbalta but she never actually took it.  She is afraid of side effects.  She continues to take Sinemet CR 1 pill 3 times a day at 8, 12 and 4.  She eats at 7, 11 and 5.  She is willing to change the timing of her Sinemet CR.  She takes low-dose pramipexole 0.25 mg at bedtime which is between 9:30 PM and 10 PM and a second optional dose an hour later if she has trembling and cannot sleep.   Update 10/06/2021 JM: patient returns for 3 month follow up unaccompanied. Overall has been stable since prior visit. Remains Sinemet 50-200mg  ER 1 pill 3 times daily, denies side effects. Continues on Mirapex .25mg  1 tab prior to bedtime, will occasionally awaken with tremors and will usually resolve after extra dose of Mirapex. Was advised by Dr. Frances Furbish to take 1 tab 2 hrs prior to bedtime and additional tab at bedtime but patient wishes to avoid taking higher dose as nighttime awakenings is not frequent.  At times has vivid dreams but this is unchanged. 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  Reports other symptoms such as abdominal bloating, depression/anxiety, and fatigue. Also mentions chronic pain currently being followed by orthopedics  through [facility].  Returns today for reevaluation.  update 06/23/2021 Dr. Frances Furbish: She reports feeling fairly stable from the Parkinson's standpoint, it helped to increase the levodopa dose.  She is taking 50-200 mg strength ER 1 pill 3 times daily, at 7 AM, 11 AM and 5 PM daily.  She takes pramipexole at bedtime and it helps her tremor throughout the night, she does not notice much in the way of restless leg symptoms but sometimes her trembling is disturbing at night.  She has vivid dreams at times, exacerbated by certain medications such as the Zanaflex.  She says orthopedics through the [facility].  She has recently had a radiofrequency ablation in the lower back on the left side about a week ago and is pending the procedure for the right side next week.  She tries to hydrate well, no recent falls, cognitively feels stable but has  had intermittent issues with anxiety and depressive symptoms, nothing sustained and would like to avoid any medication at this time.  For occasional constipation she takes FiberCon or milk of magnesia as needed.  She tries to eat natural fiber including fresh fruit and vegetables.  She finished physical therapy last week.  She fell about a year ago.  She is up-to-date with her eye appointment and goes once a year.  She generally goes to bed between 10:30 PM and 11 PM.  She lives with her husband.  She has limited her driving.  Symptoms date back to about 10 years ago in 2013 when she started having a tremor in the left hand.   The patient's allergies, current medications, family history, past medical history, past social history, past surgical history and problem list were reviewed and updated as appropriate.    Previously:   11/15/2020 (Dr. Anne Hahn): <<Julia [name] is a 82 year old right-handed white female with a history of Parkinson's disease and low back pain.  The patient has a lot of arthritic issues, she wakes up in the morning feeling quite  stiff but feels much better when she gets up and moves around.  She has restless leg syndrome at night, but she has gained good improvement with Mirapex.  She is on Sinemet CR taking the 25/100 mg tablets, 1 tablet 4 times daily.  She takes her medications at 7 AM, 11 AM, 3 PM, and 7 PM.  She is still noting some wearing off effect about a half an hour prior to the next dose of her medication.  In the middle the night, she will wake up, and her arm tremors will keep her from getting back to sleep.  She has chronic issues with constipation, she takes milk of magnesia, she has been on FiberCon but still has some issues.  In the past, she could not tolerate magnesium supplementation secondary to diarrhea.  The patient is remaining active, she will walk at least a mile a day.  She has noted some emotional instability that she attributes to her medication.  She does have some gait instability, she denies any falls but will stumble on occasion.  She denies any issues of significance with swallowing.  She has had some sciatica type pain down the left leg, she is followed through orthopedic surgery and may end up getting an epidural steroid injection in the near future.  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   RLS (restless legs syndrome) 07/14/2018   Scoliosis    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HCC)    Skin cancer 07/2022   removed from R arm    PSH:  Past Surgical History:  Procedure Laterality Date   ADENOIDECTOMY     BREAST LUMPECTOMY Left    radiation only   BUNIONECTOMY     COLON SURGERY     HEMORRHOID  SURGERY     LAPAROSCOPY     NASAL SINUS SURGERY     OOPHORECTOMY     TONSILLECTOMY  TUBAL LIGATION      Social History:  Social History   Socioeconomic History   Marital status: Married    Spouse name: Not on file   Number of children: 3   Years of education: masters + 45 credits   Highest education level: Master's degree (e.g., MA, MS, MEng, MEd, MSW, MBA)  Occupational History   Occupation: Retired Runner, broadcasting/film/video   Tobacco Use   Smoking status: Never    Passive exposure: Never   Smokeless tobacco: Never  Vaping Use   Vaping status: Never Used  Substance and Sexual Activity   Alcohol use: Yes    Comment: a beer once a month with pizza   Drug use: Never   Sexual activity: Not Currently  Other Topics Concern   Not on file  Social History Narrative   Right handed    Lives with husband    Caffeine 2 cups daily    Retired Wellsite geologist   Social Determinants of Health   Financial Resource Strain: Low Risk  (03/09/2022)   Overall Financial Resource Strain (CARDIA)    Difficulty of Paying Living Expenses: Not hard at all  Food Insecurity: No Food Insecurity (03/09/2022)   Hunger Vital Sign    Worried About Running Out of Food in the Last Year: Never true    Ran Out of Food in the Last Year: Never true  Transportation Needs: No Transportation Needs (03/09/2022)   PRAPARE - Administrator, Civil Service (Medical): No    Lack of Transportation (Non-Medical): No  Physical Activity: Sufficiently Active (03/09/2022)   Exercise Vital Sign    Days of Exercise per Week: 7 days    Minutes of Exercise per Session: 30 min  Stress: No Stress Concern Present (03/09/2022)   [employer] of Occupational Health - Occupational Stress Questionnaire    Feeling of Stress : Only a little  Social Connections: Socially Integrated (03/09/2022)   Social Connection and Isolation Panel [NHANES]    Frequency of Communication with Friends and Family: More than three times a week     Frequency of Social Gatherings with Friends and Family: More than three times a week    Attends Religious Services: More than 4 times per year    Active Member of [employer] or Organizations: Yes    Attends Banker Meetings: 1 to 4 times per year    Marital Status: Married  Catering manager Violence: Not At Risk (03/09/2022)   Humiliation, Afraid, Rape, and Kick questionnaire    Fear of Current or Ex-Partner: No    Emotionally Abused: No    Physically Abused: No    Sexually Abused: No    Family History:  Family History  Problem Relation Age of Onset   Arthritis Mother    Diabetes Mother    Heart disease Mother    Hypertension Mother    Arthritis Father    Cancer Father    Depression Father    Osteoporosis Father    Breast cancer Sister    Arthritis Sister    Cancer Sister    Diabetes Sister    Cancer Sister    Cancer Sister    Hyperlipidemia Sister    Cancer Brother    Hyperlipidemia Brother    Parkinson's disease Neg Hx     Medications:   Current Outpatient Medications on File Prior to Visit  Medication Sig Dispense Refill   Acetaminophen (TYLENOL PO) Take by mouth as needed.     Calcium Citrate-Vitamin  D 315-250 MG-UNIT TABS Take by mouth.     carbidopa-levodopa (SINEMET CR) 50-200 MG tablet Take 1 tablet by mouth in the morning, at noon, in the evening, and at bedtime. 9 AM, 1 PM, 5 PM and 9 PM daily. 360 tablet 3   denosumab (PROLIA) 60 MG/ML SOSY injection Inject 60 mg into the skin every 6 (six) months.     diphenhydrAMINE (BENADRYL) 12.5 MG chewable tablet Chew 12.5 mg by mouth every 6 (six) hours as needed.     estradiol (ESTRACE) 0.1 MG/GM vaginal cream Place 0.5g twice a week at night 30 g 11   fluconazole (DIFLUCAN) 150 MG tablet Take 1 pill tomorrow and the 2nd pill 3 days later. 2 tablet 0   levothyroxine (SYNTHROID) 88 MCG tablet TAKE ONE TABLET BY MOUTH DAILY FOR BREAKFAST 90 tablet 3   Multiple Vitamins-Minerals (CENTRUM SILVER PO) Take by mouth.      Polyvinyl Alcohol-Povidone (REFRESH OP) Apply to eye.     pramipexole (MIRAPEX) 0.25 MG tablet TAKE 1 TABLET BY MOUTH 2 HOURS BEFORE BEDTIME AND 1 TABLET AT BEDTIME. 180 tablet 0   Probiotic Product (PROBIOTIC DAILY PO) Take by mouth.     SALINE NASAL MIST NA Place into the nose.     No current facility-administered medications on file prior to visit.    Allergies:   Allergies  Allergen Reactions   Cefdinir    Cholestyramine     GERD   Contrast Media [Iodinated Contrast Media] Diarrhea   Ezetimibe    Meperidine    Nitrofurantoin Diarrhea   Septra [Sulfamethoxazole-Trimethoprim] Diarrhea   Statins    Teriparatide       OBJECTIVE: Today's Vitals   02/23/23 1023  BP: 137/74  Pulse: 91  Weight: 135 lb (61.2 kg)  Height: 5\' 4"  (1.626 m)   Body mass index is 23.17 kg/m.  Physical Exam  General: well developed, well nourished, very pleasant elderly Caucasian female, seated, in no evident distress  Neurologic Exam Mental Status: Awake and fully alert. Oriented to place and time. Recent and remote memory intact. Attention span, concentration and fund of knowledge appropriate. Mood and affect appropriate. Mild to moderate facial masking.  Mild hypophonia.   Cranial Nerves: Pupils equal, briskly reactive to light. Extraocular movements full without nystagmus. Visual fields full to confrontation. Hearing intact. Facial sensation intact. Face, tongue, palate moves normally and symmetrically.  Motor: Normal strength in all tested extremity muscles.  Mildly increased tone throughout.  Unable to appreciate resting tremor. Unable to appreciate intention tremor.  L>R cogwheel rigidity Sensory.: intact to touch , pinprick , position and vibratory sensation.  Coordination: Mild to moderate difficulty with hand movements more so on the left and bilateral foot tapping. Finger-to-nose and heel-to-shin performed accurately bilaterally. Gait and Station: Arises from chair with mild  difficulty. Stance is hunched with upper back curvature.  Slow cautious steps with decreased left arm swing. No use of AD.  Tandem walk and heel toe not attempted. Reflexes: 1+ and symmetric. Toes downgoing.      ASSESSMENT/PLAN: Julia [name] is a 82 y.o. year old female with Parkinson's disease with left-sided predominance with concern of gradual worsening   -Recommend increasing Sinemet CR 50-200mg  1 tab to 4 times daily as previously recommended by Dr. Frances Furbish as patient having wearing off symptoms on current TID dosing. Does have difficulty remembering to take dose on time, discussed ways to help with this. -Continue Mirapex 0.25 mg nightly 1-2 hrs prior to bed and  1 at bedtime. Did discuss if she starts to experience increased frequency of tremors that wake her from sleep, can consider use of extra dose as needed -recommend trial of melatonin to help with likely REM sleep behavior disorder -previously discussed trial of medication for neuropathy but declines interest -advised to discuss ongoing anxiety concerns with PCP. Also advised to discuss hair loss concerns with PCP, very low suspicion side effect from Sinemet or Mirapex, would recommend recheck of TSH at f/u visit with PCP      Follow up in 6 months with Dr. Frances Furbish or call earlier if needed    CC:  PCP: Willow Ora, MD    I spent a prolonged 50 minutes of face-to-face and non-face-to-face time with patient.  This included previsit chart review, lab review, study review, order entry, electronic health record documentation, patient education and discussion regarding above diagnoses and treatment plan and answered all other questions to patient's satisfaction  Ihor Austin, [facility]  [facility] [address] [place], [place] [address]  Phone [phone] Fax [phone] Note: This document was prepared with digital dictation and possible smart phrase technology. Any transcriptional  errors that result from this process are unintentional.

## 2023-03-12 ENCOUNTER — Encounter: Payer: Medicare HMO | Admitting: Family Medicine

## 2023-03-15 ENCOUNTER — Ambulatory Visit (INDEPENDENT_AMBULATORY_CARE_PROVIDER_SITE_OTHER): Payer: Medicare HMO

## 2023-03-15 ENCOUNTER — Ambulatory Visit (INDEPENDENT_AMBULATORY_CARE_PROVIDER_SITE_OTHER): Payer: Medicare HMO | Admitting: Family

## 2023-03-15 ENCOUNTER — Telehealth: Payer: Self-pay | Admitting: Family Medicine

## 2023-03-15 VITALS — BP 115/69 | HR 98 | Temp 98.0°F | Ht 64.0 in | Wt 136.4 lb

## 2023-03-15 VITALS — Wt 133.0 lb

## 2023-03-15 DIAGNOSIS — N3090 Cystitis, unspecified without hematuria: Secondary | ICD-10-CM

## 2023-03-15 DIAGNOSIS — N390 Urinary tract infection, site not specified: Secondary | ICD-10-CM

## 2023-03-15 DIAGNOSIS — Z0000 Encounter for general adult medical examination without abnormal findings: Secondary | ICD-10-CM | POA: Diagnosis not present

## 2023-03-15 LAB — POCT URINALYSIS DIPSTICK
Bilirubin, UA: NEGATIVE
Blood, UA: NEGATIVE
Glucose, UA: NEGATIVE
Ketones, UA: NEGATIVE
Nitrite, UA: NEGATIVE
Protein, UA: NEGATIVE
Spec Grav, UA: >=1.03 — AB (ref 1.010–1.025)
Urobilinogen, UA: 0.2 U/dL
pH, UA: 6 (ref 5.0–8.0)

## 2023-03-15 NOTE — Patient Instructions (Signed)
Julia [name] , Thank you for taking time to come for your Medicare Wellness Visit. I appreciate your ongoing commitment to your health goals. Please review the following plan we discussed and let me know if I can assist you in the future.   Referrals/Orders/Follow-Ups/Clinician Recommendations: continue to Aim for 30 minutes of exercise or brisk walking, 6-8 glasses of water, and 5 servings of fruits and vegetables each day.   This is a list of the screening recommended for you and due dates:  Health Maintenance  Topic Date Due   Yearly kidney health urinalysis for diabetes  08/09/2022   DEXA scan (bone density measurement)  09/21/2022   COVID-19 Vaccine (5 - 2024-25 season) 11/29/2022   Yearly kidney function blood test for diabetes  03/04/2023   Mammogram  09/24/2023   Medicare Annual Wellness Visit  03/14/2024   DTaP/Tdap/Td vaccine (3 - Td or Tdap) 01/06/2032   Pneumonia Vaccine  Completed   Flu Shot  Completed   Zoster (Shingles) Vaccine  Completed   HPV Vaccine  Aged Out    Advanced directives: (In Chart) A copy of your advanced directives are scanned into your chart should your provider ever need it.  Next Medicare Annual Wellness Visit scheduled for next year: Yes

## 2023-03-15 NOTE — Telephone Encounter (Signed)
Pt is requesting to get next Prolia injection. Do not see an approval for this year. Please advise

## 2023-03-15 NOTE — Progress Notes (Signed)
Patient ID: Julia [name], female    DOB: [birth year], 82 y.o.   MRN: [id number]  Chief Complaint  Patient presents with   Dysuria    Pt c/o dysuria, urinary frequency, Present for 5 days.        Discussed the use of AI scribe software for clinical note transcription with the patient, who gave verbal consent to proceed.  History of Present Illness   The patient, with a history of recurrent urinary tract infections (UTIs), presents with urinary symptoms that started last Thursday. She describes discomfort in the late afternoon and evening that persists into the morning, but then is better until afternoon again. The symptoms include dysuria and urinary frequency. She denies taking any over-the-counter medications for symptom relief. She denies foul odor, fever, and back or pelvic pain.  The patient was previously evaluated by a gyne-urologist, Dr. Florian Buff, for recurrent UTIs. During that visit, she underwent a comprehensive internal examination and was found to have a UTI and given AMOX. 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 She has not returned to the gyne-urologist since that visit.     Assessment & Plan:     Recurrent Urinary Tract Infections - Patient reports dysuria, frequency, and urgency starting in the late afternoon and persisting into the evening. No fever, back pain, or foul odor. Urinalysis shows trace infection. Patient has a history of UTIs and was previously evaluated by Urology. -Send urine for culture and await results before starting antibiotics. -Increase water intake to 2L daily. -Consider referral back to Gyne-Urology for further evaluation if symptoms persist or recur.     Subjective:    Outpatient Medications Prior to Visit  Medication Sig Dispense Refill   Acetaminophen (TYLENOL PO) Take by mouth as needed.     Calcium Citrate-Vitamin D 315-250 MG-UNIT TABS Take by mouth.     carbidopa-levodopa (SINEMET CR)  50-200 MG tablet Take 1 tablet by mouth in the morning, at noon, in the evening, and at bedtime. 7 AM, 11 PM, 3 PM and 7 PM daily. 360 tablet 3   denosumab (PROLIA) 60 MG/ML SOSY injection Inject 60 mg into the skin every 6 (six) months.     diphenhydrAMINE (BENADRYL) 12.5 MG chewable tablet Chew 12.5 mg by mouth every 6 (six) hours as needed.     estradiol (ESTRACE) 0.1 MG/GM vaginal cream Place 0.5g twice a week at night 30 g 11   levothyroxine (SYNTHROID) 88 MCG tablet TAKE ONE TABLET BY MOUTH DAILY FOR BREAKFAST 90 tablet 3   Multiple Vitamins-Minerals (CENTRUM SILVER PO) Take by mouth.     Polyvinyl Alcohol-Povidone (REFRESH OP) Apply to eye.     pramipexole (MIRAPEX) 0.25 MG tablet TAKE 1 TABLET BY MOUTH 2 HOURS BEFORE BEDTIME AND 1 TABLET AT BEDTIME. 180 tablet 0   Probiotic Product (PROBIOTIC DAILY PO) Take by mouth.     SALINE NASAL MIST NA Place into the nose.     No facility-administered medications prior to visit.   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   RLS (restless legs syndrome) 07/14/2018   Scoliosis    Secondary malignant neoplasm of unspecified  ovary (HCC)    Skin cancer 07/2022   removed from R arm   Past Surgical History:  Procedure Laterality Date   ADENOIDECTOMY     BREAST LUMPECTOMY Left    radiation only   BUNIONECTOMY     COLON SURGERY     HEMORRHOID SURGERY     LAPAROSCOPY     NASAL SINUS SURGERY     OOPHORECTOMY     TONSILLECTOMY     TUBAL LIGATION     Allergies  Allergen Reactions   Cefdinir    Cholestyramine     GERD   Contrast Media [Iodinated Contrast Media] Diarrhea   Ezetimibe    Meperidine    Nitrofurantoin Diarrhea   Septra [Sulfamethoxazole-Trimethoprim] Diarrhea   Statins     Teriparatide       Objective:    Physical Exam Vitals and nursing note reviewed.  Constitutional:      Appearance: Normal appearance.  Cardiovascular:     Rate and Rhythm: Normal rate and regular rhythm.  Pulmonary:     Effort: Pulmonary effort is normal.     Breath sounds: Normal breath sounds.  Musculoskeletal:        General: Normal range of motion.  Skin:    General: Skin is warm and dry.  Neurological:     Mental Status: She is alert.  Psychiatric:        Mood and Affect: Mood normal.        Behavior: Behavior normal.    BP 115/69 (BP Location: Right Arm, Patient Position: Sitting, Cuff Size: Normal)   Pulse 98   Temp 98 F (36.7 C) (Temporal)   Ht 5\' 4"  (1.626 m)   Wt 136 lb 6.4 oz (61.9 kg)   SpO2 99%   BMI 23.41 kg/m  Wt Readings from Last 3 Encounters:  03/15/23 136 lb 6.4 oz (61.9 kg)  03/15/23 133 lb (60.3 kg)  02/23/23 135 lb (61.2 kg)       Dulce Sellar, NP

## 2023-03-15 NOTE — Progress Notes (Signed)
Subjective:   Julia [name] is a 82 y.o. female who presents for Medicare Annual (Subsequent) preventive examination.  Visit Complete: Virtual I connected with  Leanne Chang on 03/15/23 by a audio enabled telemedicine application and verified that I am speaking with the correct person using two identifiers.  Patient Location: Home  Provider Location: Office/Clinic  I discussed the limitations of evaluation and management by telemedicine. The patient expressed understanding and agreed to proceed.  Vital Signs: Because this visit was a virtual/telehealth visit, some criteria may be missing or patient reported. Any vitals not documented were not able to be obtained and vitals that have been documented are patient reported.   Cardiac Risk Factors include: advanced age (>5men, >38 women)     Objective:    Today's Vitals   03/15/23 0833  Weight: 133 lb (60.3 kg)   Body mass index is 22.83 kg/m.     03/15/2023    8:42 AM 03/15/2023    8:40 AM 04/09/2022    5:34 PM 03/09/2022    8:28 AM 05/18/2021    4:04 PM 02/24/2021    8:55 AM 02/26/2020    1:33 PM  Advanced Directives  Does Patient Have a Medical Advance Directive? Yes Yes Yes Yes No Yes Yes  Type of Estate agent of [place];Living will Healthcare Power of [place];Living will  Healthcare Power of [place];Living will  Healthcare Power of Attorney   Does patient want to make changes to medical advance directive? No - Patient declined No - Patient declined  No - Patient declined     Copy of Healthcare Power of Attorney in Chart? Yes - validated most recent copy scanned in chart (See row information) Yes - validated most recent copy scanned in chart (See row information)  Yes - validated most recent copy scanned in chart (See row information)  Yes - validated most recent copy scanned in chart (See row information)   Would patient like information on creating a medical advance directive?     No - Patient  declined      Current Medications (verified) Outpatient Encounter Medications as of 03/15/2023  Medication Sig   Acetaminophen (TYLENOL PO) Take by mouth as needed.   Calcium Citrate-Vitamin D 315-250 MG-UNIT TABS Take by mouth.   carbidopa-levodopa (SINEMET CR) 50-200 MG tablet Take 1 tablet by mouth in the morning, at noon, in the evening, and at bedtime. 7 AM, 11 PM, 3 PM and 7 PM daily.   estradiol (ESTRACE) 0.1 MG/GM vaginal cream Place 0.5g twice a week at night   levothyroxine (SYNTHROID) 88 MCG tablet TAKE ONE TABLET BY MOUTH DAILY FOR BREAKFAST   Multiple Vitamins-Minerals (CENTRUM SILVER PO) Take by mouth.   Polyvinyl Alcohol-Povidone (REFRESH OP) Apply to eye.   pramipexole (MIRAPEX) 0.25 MG tablet TAKE 1 TABLET BY MOUTH 2 HOURS BEFORE BEDTIME AND 1 TABLET AT BEDTIME.   Probiotic Product (PROBIOTIC DAILY PO) Take by mouth.   SALINE NASAL MIST NA Place into the nose.   denosumab (PROLIA) 60 MG/ML SOSY injection Inject 60 mg into the skin every 6 (six) months. (Patient not taking: Reported on 03/15/2023)   diphenhydrAMINE (BENADRYL) 12.5 MG chewable tablet Chew 12.5 mg by mouth every 6 (six) hours as needed. (Patient not taking: Reported on 03/15/2023)   [DISCONTINUED] fluconazole (DIFLUCAN) 150 MG tablet Take 1 pill tomorrow and the 2nd pill 3 days later.   No facility-administered encounter medications on file as of 03/15/2023.    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   RLS (restless legs syndrome) 07/14/2018    Scoliosis    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HCC)    Skin cancer 07/2022   removed from R arm   Past Surgical History:  Procedure Laterality Date   ADENOIDECTOMY     BREAST LUMPECTOMY Left    radiation only   BUNIONECTOMY     COLON SURGERY     HEMORRHOID SURGERY     LAPAROSCOPY     NASAL SINUS SURGERY     OOPHORECTOMY     TONSILLECTOMY     TUBAL LIGATION     Family History  Problem Relation Age of Onset   Arthritis Mother    Diabetes Mother    Heart disease Mother    Hypertension Mother    Arthritis Father    Cancer Father    Depression Father    Osteoporosis Father    Breast cancer Sister    Arthritis Sister    Cancer Sister    Diabetes Sister    Cancer Sister    Cancer Sister    Hyperlipidemia Sister    Cancer Brother    Hyperlipidemia Brother    Parkinson's disease Neg Hx    Social History   Socioeconomic History   Marital status: Married    Spouse name: Not on file   Number of children: 3   Years of education: masters + 45 credits   Highest education level: Master's degree (e.g., MA, MS, MEng, MEd, MSW, MBA)  Occupational History   Occupation: Retired Runner, broadcasting/film/video   Tobacco Use   Smoking status: Never    Passive exposure: Never   Smokeless tobacco: Never  Vaping Use   Vaping status: Never Used  Substance and Sexual Activity   Alcohol use: Yes    Comment: a beer once a month with pizza   Drug use: Never   Sexual activity: Not Currently  Other Topics Concern   Not on file  Social History Narrative   Right handed    Lives with husband    Caffeine 2 cups daily    Retired Wellsite geologist   Social Drivers of Health   Financial Resource Strain: Low Risk  (03/15/2023)   Overall Financial Resource Strain (CARDIA)    Difficulty of Paying Living Expenses: Not hard at all  Food Insecurity: No Food Insecurity (03/15/2023)   Hunger Vital Sign    Worried About Running Out of Food in the Last Year: Never true    Ran Out of Food in the Last  Year: Never true  Transportation Needs: No Transportation Needs (03/15/2023)   PRAPARE - Administrator, Civil Service (Medical): No    Lack of Transportation (Non-Medical): No  Physical Activity: Sufficiently Active (03/15/2023)   Exercise Vital Sign    Days of Exercise per Week: 7 days    Minutes of Exercise per Session: 60 min  Stress: No Stress Concern Present (03/15/2023)   [employer] of Occupational Health - Occupational Stress Questionnaire    Feeling of Stress : Only a little  Social  Connections: Moderately Integrated (03/15/2023)   Social Connection and Isolation Panel [NHANES]    Frequency of Communication with Friends and Family: More than three times a week    Frequency of Social Gatherings with Friends and Family: More than three times a week    Attends Religious Services: More than 4 times per year    Active Member of [employer] or Organizations: No    Attends Banker Meetings: Never    Marital Status: Married    Tobacco Counseling Counseling given: Not Answered   Clinical Intake:  Pre-visit preparation completed: Yes  Pain : No/denies pain     BMI - recorded: 22.83 Nutritional Status: BMI of 19-24  Normal Nutritional Risks: None Diabetes: No CBG done?: No Did pt. bring in CBG monitor from home?: No  How often do you need to have someone help you when you read instructions, pamphlets, or other written materials from your doctor or pharmacy?: 1 - Never  Interpreter Needed?: No  Information entered by :: Lanier Ensign, LPN   Activities of Daily Living    03/15/2023    8:35 AM  In your present state of health, do you have any difficulty performing the following activities:  Hearing? 1  Comment hearing aids  Vision? 0  Difficulty concentrating or making decisions? 0  Walking or climbing stairs? 0  Dressing or bathing? 0  Doing errands, shopping? 0  Preparing Food and eating ? N  Using the Toilet? N  In the past six  months, have you accidently leaked urine? Y  Comment at times  Do you have problems with loss of bowel control? N  Managing your Medications? N  Managing your Finances? N  Housekeeping or managing your Housekeeping? N    Patient Care Team: Willow Ora, MD as PCP - General (Family Medicine) York Spaniel, MD (Inactive) as Consulting Physician (Neurology) Bobbitt, Heywood Iles, MD as Consulting Physician (Allergy and Immunology) Mansouraty, Netty Starring., MD as Consulting Physician (Gastroenterology) Malachy Mood, MD as Consulting Physician (Oncology) Sedalia Muta, PT as Physical Therapist (Physical Therapy) Vivi Barrack, DPM as Consulting Physician (Podiatry) Huston Foley, MD as Attending Physician (Neurology) Erroll Luna, [facility] (Inactive) as Pharmacist (Pharmacist) Charleen Kirks, MD as Referring Physician (Family Medicine)  Indicate any recent Medical Services you may have received from other than [facility] providers in the past year (date may be approximate).     Assessment:   This is a routine wellness examination for [facility].  Hearing/Vision screen Hearing Screening - Comments:: Pt has hearing aids  Vision Screening - Comments:: Pt follows up with Dr Cherlynn Polo for annual eye exams    Goals Addressed             This Visit's Progress    Patient Stated       Maintain health and activity        Depression Screen    03/15/2023    8:39 AM 04/09/2022    5:33 PM 03/09/2022    8:26 AM 03/03/2022   11:21 AM 01/12/2022    1:28 PM 02/24/2021    8:53 AM 02/05/2021   10:59 AM  PHQ 2/9 Scores  PHQ - 2 Score 0 0 0 0 0 0 0    Fall Risk    03/15/2023    8:43 AM 04/09/2022    5:34 PM 04/09/2022    5:33 PM 03/09/2022    8:29 AM 03/03/2022   11:21 AM  Fall Risk   Falls in the  past year? 1 1 0 1 1  Number falls in past yr: 1 1  1  0  Injury with Fall? 1   1 1   Risk for fall due to : History of fall(s)   Impaired vision;Impaired balance/gait No Fall Risks  Follow up  Falls prevention discussed   Falls prevention discussed Falls evaluation completed    MEDICARE RISK AT HOME: Medicare Risk at Home Any stairs in or around the home?: No If so, are there any without handrails?: No Home free of loose throw rugs in walkways, pet beds, electrical cords, etc?: Yes Adequate lighting in your home to reduce risk of falls?: Yes Life alert?: No Use of a cane, walker or w/c?: No Grab bars in the bathroom?: Yes Shower chair or bench in shower?: Yes Elevated toilet seat or a handicapped toilet?: Yes  TIMED UP AND GO:  Was the test performed?  No    Cognitive Function:    03/15/2023    8:45 AM 11/13/2019   11:28 AM 05/29/2019   11:03 AM 12/28/2018   11:08 AM  MMSE - Mini Mental State Exam  Not completed: Refused     Orientation to time  5 5 5   Orientation to Place  5 5 5   Registration  3 3 3   Attention/ Calculation  5 2 5   Recall  3 3 3   Language- name 2 objects  2 2 2   Language- repeat  1 1 1   Language- follow 3 step command  3 3 3   Language- read & follow direction  1 1 1   Write a sentence  1 1 1   Copy design  1 1 1   Total score  30 27 30         02/24/2021    9:00 AM 01/27/2019    3:21 PM  6CIT Screen  What Year? 0 points 0 points  What month? 0 points 0 points  What time? 0 points 0 points  Count back from 20 0 points 0 points  Months in reverse 0 points 0 points  Repeat phrase 0 points 0 points  Total Score 0 points 0 points    Immunizations Immunization History  Administered Date(s) Administered   Fluad Quad(high Dose 65+) 12/16/2018, 01/01/2020, 01/01/2021, 12/25/2021   Influenza, High Dose Seasonal PF 01/05/2015   Influenza,inj,Quad PF,6+ Mos 01/10/2018   Influenza-Unspecified 01/13/2023   Moderna Sars-Covid-2 Vaccination 04/10/2019, 05/08/2019   PFIZER(Purple Top)SARS-COV-2 Vaccination 02/02/2020   Pfizer Covid-19 Vaccine Bivalent Booster 22yrs & up 01/01/2021   Pneumococcal Conjugate-13 01/16/2014   Pneumococcal  Polysaccharide-23 12/10/2007, 01/29/2015   Respiratory Syncytial Virus Vaccine,Recomb Aduvanted(Arexvy) 12/16/2021   Td 01/05/2022   Tdap 01/05/2022   Zoster Recombinant(Shingrix) 10/15/2017, 01/12/2018, 02/17/2018    TDAP status: Up to date  Flu Vaccine status: Up to date  Pneumococcal vaccine status: Up to date  Covid-19 vaccine status: Information provided on how to obtain vaccines.   Qualifies for Shingles Vaccine? Yes   Zostavax completed Yes   Shingrix Completed?: Yes  Screening Tests Health Maintenance  Topic Date Due   Diabetic kidney evaluation - Urine ACR  08/09/2022   DEXA SCAN  09/21/2022   COVID-19 Vaccine (5 - 2024-25 season) 11/29/2022   Diabetic kidney evaluation - eGFR measurement  03/04/2023   MAMMOGRAM  09/24/2023   Medicare Annual Wellness (AWV)  03/14/2024   DTaP/Tdap/Td (3 - Td or Tdap) 01/06/2032   Pneumonia Vaccine 53+ Years old  Completed   INFLUENZA VACCINE  Completed   Zoster Vaccines- Shingrix  Completed   HPV VACCINES  Aged Out    Health Maintenance  Health Maintenance Due  Topic Date Due   Diabetic kidney evaluation - Urine ACR  08/09/2022   DEXA SCAN  09/21/2022   COVID-19 Vaccine (5 - 2024-25 season) 11/29/2022   Diabetic kidney evaluation - eGFR measurement  03/04/2023    Colorectal cancer screening: No longer required.   Mammogram status: Completed 09/24/22. Repeat every year  Bone Density status: Ordered 03/15/23. Pt provided with contact info and advised to call to schedule appt.  Additional Screening:   Vision Screening: Recommended annual ophthalmology exams for early detection of glaucoma and other disorders of the eye. Is the patient up to date with their annual eye exam?  Yes  Who is the provider or what is the name of the office in which the patient attends annual eye exams? Dr Cherlynn Polo  If pt is not established with a provider, would they like to be referred to a provider to establish care? No .   Dental Screening:  Recommended annual dental exams for proper oral hygiene   Community Resource Referral / Chronic Care Management: CRR required this visit?  No   CCM required this visit?  No     Plan:     I have personally reviewed and noted the following in the patient's chart:   Medical and social history Use of alcohol, tobacco or illicit drugs  Current medications and supplements including opioid prescriptions. Patient is not currently taking opioid prescriptions. Functional ability and status Nutritional status Physical activity Advanced directives List of other physicians Hospitalizations, surgeries, and ER visits in previous 12 months Vitals Screenings to include cognitive, depression, and falls Referrals and appointments  In addition, I have reviewed and discussed with patient certain preventive protocols, quality metrics, and best practice recommendations. A written personalized care plan for preventive services as well as general preventive health recommendations were provided to patient.     Marzella Schlein, LPN   84/13/2440   After Visit Summary: (MyChart) Due to this being a telephonic visit, the after visit summary with patients personalized plan was offered to patient via MyChart   Nurse Notes:  Pt declined cognition was able to answer questions with knowledgeable. Pt stated she has not had a prolia injection for a while   she is coming into the office today at 1 pm for a urine at which time she is chart is showing she's also due for a Diabetic kidney evaluation please advise.

## 2023-03-16 ENCOUNTER — Other Ambulatory Visit (HOSPITAL_COMMUNITY): Payer: Self-pay

## 2023-03-16 ENCOUNTER — Telehealth: Payer: Self-pay

## 2023-03-16 NOTE — Telephone Encounter (Signed)
Pharmacy Patient Advocate Encounter   Received notification from  [employer] Portal that prior authorization for Prolia is required/requested.   Insurance verification completed.   The patient is insured through [employer] .   Per test claim: PA required; PA submitted to above mentioned insurance via Availity Key/confirmation #/EOC [id number] Status is pending   PA is for medical buy and bill

## 2023-03-16 NOTE — Telephone Encounter (Signed)
Noted  

## 2023-03-17 ENCOUNTER — Other Ambulatory Visit (HOSPITAL_COMMUNITY): Payer: Self-pay

## 2023-03-17 LAB — URINE CULTURE
MICRO NUMBER:: 15854943
SPECIMEN QUALITY:: ADEQUATE

## 2023-03-17 NOTE — Telephone Encounter (Signed)
Pt ready for scheduling for Prolia on or after : 03/17/23  Out-of-pocket cost due at time of visit: $0 (via Medical buy and bill)  Primary: [employer] - Medicare Prolia co-insurance: 0% Admin fee co-insurance: 0%  Secondary: N/A Prolia co-insurance:  Admin fee co-insurance:   Medical Benefit Details: Date Benefits were checked: 03/16/23 Deductible: no/ Coinsurance: no/ Admin Fee: no (Prolia and administration covered at 100%)  Prior Auth: Approved PA# [id number] Expiration Date: 03/16/23 to 03/15/24  # of doses approved:  Pharmacy benefit: Copay $500 If patient wants fill through the pharmacy benefit please send prescription to: [employer], and include estimated need by date in rx notes. Pharmacy will ship medication directly to the office.  Patient not eligible for Prolia Copay Card. Copay Card can make patient's cost as little as $25. Link to apply: https://www.amgensupportplus.com/copay  ** This summary of benefits is an estimation of the patient's out-of-pocket cost. Exact cost may very based on individual plan coverage.

## 2023-03-19 MED ORDER — CEPHALEXIN 500 MG PO CAPS: 500.0000 mg | ORAL_CAPSULE | Freq: Two times a day (BID) | ORAL | 0 refills | Status: AC

## 2023-03-19 NOTE — Progress Notes (Signed)
see my chart message.

## 2023-03-19 NOTE — Addendum Note (Signed)
Addended byDulce Sellar on: 03/19/2023 08:15 AM   Modules accepted: Orders

## 2023-03-22 ENCOUNTER — Telehealth: Payer: Self-pay

## 2023-03-22 MED ORDER — LEVOTHYROXINE SODIUM 88 MCG PO TABS: ORAL_TABLET | ORAL | 3 refills | Status: DC

## 2023-03-22 MED ORDER — DENOSUMAB 60 MG/ML ~~LOC~~ SOSY: 60.0000 mg | PREFILLED_SYRINGE | SUBCUTANEOUS | 0 refills | Status: AC

## 2023-03-22 NOTE — Telephone Encounter (Signed)
Copied from CRM [id number]. Topic: Clinical - Medication Refill >> Mar 22, 2023 11:13 AM Louie Boston wrote: Most Recent Primary Care Visit:  Provider: Dulce Sellar  Department: [facility]  Visit Type: SAME DAY  Date: 03/15/2023  Medication: levothyroxine (SYNTHROID) 88 MCG tablet, denosumab (PROLIA) 60 MG/ML SOSY injection  Has the patient contacted their pharmacy? Yes (Agent: If no, request that the patient contact the pharmacy for the refill. If patient does not wish to contact the pharmacy document the reason why and proceed with request.) (Agent: If yes, when and what did the pharmacy advise?)  Patient was advised by pharmacy that she will need to contact the provider to have the pharmacy listed below added in order to fill the prescription. Patient has [facility] listed as her pharmacy, but has not filled this prescription at [facility]. Patient would like to request refill and have it sent to pharmacy listed below.   Is this the correct pharmacy for this prescription? Yes If no, delete pharmacy and type the correct one.  This is the patient's preferred pharmacy:  [facility] [id number] [place], [place] - [address] [place] [address] Phone: [phone] Fax: [phone]   Has the prescription been filled recently? No  Is the patient out of the medication? No  Has the patient been seen for an appointment in the last year OR does the patient have an upcoming appointment? Yes  Can we respond through MyChart? Yes  Agent: Please be advised that Rx refills may take up to 3 business days. We ask that you follow-up with your pharmacy.

## 2023-03-30 ENCOUNTER — Telehealth: Payer: Self-pay

## 2023-03-30 NOTE — Telephone Encounter (Signed)
 Copied from CRM [id number]. Topic: Medical Record Request - Records Request >> Mar 30, 2023  9:39 AM Drema MATSU wrote: Reason for CRM: Patient stated that Dr. Marilynne wants her UTI lab results or urine samples.  UTI lab results or urine samples was sent internally to Rosaline Marilynne, MD @[phone]

## 2023-03-30 NOTE — Telephone Encounter (Signed)
 Copied from CRM [id number]. Topic: Clinical - Prescription Issue >> Mar 29, 2023  1:54 PM Rosina BIRCH wrote: Reason for CRM: Pt called stating the pharmacy told her that her prolia  shot was going to be $400 but the pt got a letter in the mail from [facility] stating that they approved the medication please advise  Havelyn will contact pt to get her scheduled.

## 2023-03-30 NOTE — Telephone Encounter (Addendum)
 Called and LVM to schedule pt for prolia. Within message I included pt was due for prolia since 03/17/23 and there will be no OOP cost required.

## 2023-03-30 NOTE — Telephone Encounter (Signed)
Noted  

## 2023-04-01 ENCOUNTER — Ambulatory Visit: Payer: Medicare HMO

## 2023-04-01 ENCOUNTER — Telehealth: Payer: Self-pay

## 2023-04-01 DIAGNOSIS — M810 Age-related osteoporosis without current pathological fracture: Secondary | ICD-10-CM | POA: Diagnosis not present

## 2023-04-01 DIAGNOSIS — M170 Bilateral primary osteoarthritis of knee: Secondary | ICD-10-CM

## 2023-04-01 MED ORDER — DENOSUMAB 60 MG/ML ~~LOC~~ SOSY
60.0000 mg | PREFILLED_SYRINGE | SUBCUTANEOUS | Status: AC
  Administered 2023-04-01: 60 mg via SUBCUTANEOUS

## 2023-04-01 NOTE — Progress Notes (Signed)
 Patient was given the Prolia injection in the posterior side of the upper right arm today. Patient tolerated injection well. Donzetta Starch, CMA

## 2023-04-01 NOTE — Telephone Encounter (Signed)
 Copied from CRM [id number]. Topic: General - Other >> Apr 01, 2023  8:42 AM Russell PARAS wrote: Reason for CRM: Patient called to cancel her 10:30 AM appt to receive Prolia  shot on 01/02 and needs rescheduled. Canceled due to diarrhea this morning. Would like appointment tomorrow if possible. CB# [phone]  Please contact pt to get rescheduled.  Message sent to front desk to contact/schedule

## 2023-05-11 ENCOUNTER — Other Ambulatory Visit: Payer: Self-pay | Admitting: Neurology

## 2023-05-12 ENCOUNTER — Ambulatory Visit: Payer: Medicare HMO | Admitting: Family Medicine

## 2023-05-12 ENCOUNTER — Encounter: Payer: Self-pay | Admitting: Family Medicine

## 2023-05-12 VITALS — BP 116/70 | HR 84 | Temp 97.7°F | Ht 64.0 in | Wt 134.8 lb

## 2023-05-12 DIAGNOSIS — E782 Mixed hyperlipidemia: Secondary | ICD-10-CM

## 2023-05-12 DIAGNOSIS — R7301 Impaired fasting glucose: Secondary | ICD-10-CM

## 2023-05-12 DIAGNOSIS — N1831 Chronic kidney disease, stage 3a: Secondary | ICD-10-CM | POA: Diagnosis not present

## 2023-05-12 DIAGNOSIS — E063 Autoimmune thyroiditis: Secondary | ICD-10-CM | POA: Diagnosis not present

## 2023-05-12 DIAGNOSIS — N390 Urinary tract infection, site not specified: Secondary | ICD-10-CM | POA: Diagnosis not present

## 2023-05-12 DIAGNOSIS — Z780 Asymptomatic menopausal state: Secondary | ICD-10-CM | POA: Diagnosis not present

## 2023-05-12 DIAGNOSIS — G20A2 Parkinson's disease without dyskinesia, with fluctuations: Secondary | ICD-10-CM

## 2023-05-12 DIAGNOSIS — G620 Drug-induced polyneuropathy: Secondary | ICD-10-CM

## 2023-05-12 DIAGNOSIS — E559 Vitamin D deficiency, unspecified: Secondary | ICD-10-CM | POA: Diagnosis not present

## 2023-05-12 DIAGNOSIS — M810 Age-related osteoporosis without current pathological fracture: Secondary | ICD-10-CM

## 2023-05-12 DIAGNOSIS — J8410 Pulmonary fibrosis, unspecified: Secondary | ICD-10-CM

## 2023-05-12 DIAGNOSIS — Z0001 Encounter for general adult medical examination with abnormal findings: Secondary | ICD-10-CM

## 2023-05-12 LAB — CBC WITH DIFFERENTIAL/PLATELET
Basophils Absolute: 0 10*3/uL (ref 0.0–0.1)
Basophils Relative: 0.7 % (ref 0.0–3.0)
Eosinophils Absolute: 0.2 10*3/uL (ref 0.0–0.7)
Eosinophils Relative: 2.1 % (ref 0.0–5.0)
HCT: 42.2 % (ref 36.0–46.0)
Hemoglobin: 14.2 g/dL (ref 12.0–15.0)
Lymphocytes Relative: 11 % — ABNORMAL LOW (ref 12.0–46.0)
Lymphs Abs: 0.8 10*3/uL (ref 0.7–4.0)
MCHC: 33.5 g/dL (ref 30.0–36.0)
MCV: 92.1 fL (ref 78.0–100.0)
Monocytes Absolute: 0.6 10*3/uL (ref 0.1–1.0)
Monocytes Relative: 7.8 % (ref 3.0–12.0)
Neutro Abs: 5.6 10*3/uL (ref 1.4–7.7)
Neutrophils Relative %: 78.4 % — ABNORMAL HIGH (ref 43.0–77.0)
Platelets: 253 10*3/uL (ref 150.0–400.0)
RBC: 4.58 Mil/uL (ref 3.87–5.11)
RDW: 13.7 % (ref 11.5–15.5)
WBC: 7.2 10*3/uL (ref 4.0–10.5)

## 2023-05-12 LAB — LIPID PANEL
Cholesterol: 220 mg/dL — ABNORMAL HIGH (ref 0–200)
HDL: 92.7 mg/dL (ref >39.00)
LDL Cholesterol: 115 mg/dL — ABNORMAL HIGH (ref 0–99)
NonHDL: 127.18
Total CHOL/HDL Ratio: 2
Triglycerides: 63 mg/dL (ref 0.0–149.0)
VLDL: 12.6 mg/dL (ref 0.0–40.0)

## 2023-05-12 LAB — COMPREHENSIVE METABOLIC PANEL
ALT: 3 U/L (ref 0–35)
AST: 15 U/L (ref 0–37)
Albumin: 4.4 g/dL (ref 3.5–5.2)
Alkaline Phosphatase: 66 U/L (ref 39–117)
BUN: 19 mg/dL (ref 6–23)
CO2: 29 meq/L (ref 19–32)
Calcium: 9.8 mg/dL (ref 8.4–10.5)
Chloride: 98 meq/L (ref 96–112)
Creatinine, Ser: 0.9 mg/dL (ref 0.40–1.20)
GFR: 60.3 mL/min (ref >60.00)
Glucose, Bld: 109 mg/dL — ABNORMAL HIGH (ref 70–99)
Potassium: 4.1 meq/L (ref 3.5–5.1)
Sodium: 136 meq/L (ref 135–145)
Total Bilirubin: 0.6 mg/dL (ref 0.2–1.2)
Total Protein: 6.6 g/dL (ref 6.0–8.3)

## 2023-05-12 LAB — VITAMIN D 25 HYDROXY (VIT D DEFICIENCY, FRACTURES): VITD: 69.32 ng/mL (ref 30.00–100.00)

## 2023-05-12 LAB — HEMOGLOBIN A1C: Hgb A1c MFr Bld: 6.4 % (ref 4.6–6.5)

## 2023-05-12 LAB — TSH: TSH: 0.93 u[IU]/mL (ref 0.35–5.50)

## 2023-05-12 NOTE — Progress Notes (Signed)
Subjective  Chief Complaint  Patient presents with   Annual Exam    Pt here for Annual Exam and is currently fasting    Osteoarthritis   Tremors    HPI: Julia [name] is a 83 y.o. female who presents to [facility] [place] today for a Female Wellness Visit. She also has the concerns and/or needs as listed above in the chief complaint. These will be addressed in addition to the Health Maintenance Visit.   Wellness Visit: annual visit with health maintenance review and exam  HM: Mammogram due in June.  History of breast cancer.  Colorectal cancer screening is current.  Immunizations are up-to-date.  She walks for exercise regularly.  Overall feels well.  Diet is healthy for the most part.  She enjoys small amounts of sweets Chronic disease f/u and/or acute problem visit: (deemed necessary to be done in addition to the wellness visit): Complains of chronic bowel issues: Reviewed notes from 2020, GI.  Fecal smearing and multiples stools likely due to pelvic floor dyssynergy.  She has been to urogynecology for pelvic floor training.  I do not believe she has been back to GI.  Today she continues to struggle with multiple bowel movements in the morning.  She would like to see a different GI doctor if possible.  She is not interested in surgical repair.  These problems are chronic in nature stemming back to multiple years ago with workup started back in [place].  She denies abdominal pain, blood in the stool.  Weight loss. Chronic medical problems including hyperlipidemia, Parkinson's, osteoporosis on Prolia, neuropathy are all reviewed today.  She has no problems with medications.  She continues to struggle with some Parkinson symptoms.  I reviewed neurology notes.  She is on medications.  She is tolerating Prolia although she did have a break in therapy.  She is due for a bone density.  No recent falls or fractures.  She is statin intolerant. Recurrent UTIs: Reviewed multiple urine  cultures from last 6 months.  She has been prescribed estradiol in the past but has a hard time using this.  She would not want to be on prophylactic antibiotics.  She has not been back to your gynecologist recently.  She is without urinary symptoms now. Mild kidney dysfunction: No lower extremity edema noted. Prediabetes/diabetes: Last A1c was 6.6.  Has not been rechecked.  Patient denies symptoms of hypoglycemia.  Assessment  1. Encounter for well adult exam with abnormal findings   2. Mixed hyperlipidemia   3. Hypothyroidism due to Hashimoto's thyroiditis   4. Impaired fasting glucose   5. Parkinson's disease with fluctuating manifestations, unspecified whether dyskinesia present (HCC)   6. Age-related osteoporosis without current pathological fracture   7. Drug-induced polyneuropathy (HCC)   8. Recurrent UTI   9. Vitamin D deficiency disease   10. 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HCC)   11. Stage 3a chronic kidney disease (HCC) Chronic     Plan  Female Wellness Visit: Age appropriate Health Maintenance and Prevention measures were discussed with patient. Included topics are cancer screening recommendations, ways to keep healthy (see AVS) including dietary and exercise recommendations, regular eye and dental care, use of seat belts, and avoidance of moderate alcohol use and tobacco use.  Mammogram bone density ordered.  Education given BMI: discussed patient's BMI and encouraged positive lifestyle modifications to help get to or maintain a target BMI. HM needs and immunizations were addressed and ordered. See below for orders. See HM  and immunization section for updates. Routine labs and screening tests ordered including cmp, cbc and lipids where appropriate. Discussed recommendations regarding Vit D and calcium supplementation (see AVS)  Chronic disease management visit and/or acute problem visit: Diabetes: New onset.  Recheck A1c.  Diabetes nutrition and guidance discussed.  Not on medications.  No  current indication. Hypothyroidism for recheck on levothyroxine.  Clinically stable Continue Prolia for osteoporosis.  Recheck bone density in June along with mammogram Recurrent UTI: Will monitor.  If becomes persistent and recurrent, refer back to urogynecology. Check vitamin D levels Check renal function Can consider a new GI referral although I do not believe she followed up after her last visit with Dr. Meridee Score.  She prefers not to have surgery.  Currently treated with Metamucil nightly.  Will confer with her a little bit more and then refer if she would like. Parkinson's management per neurology.  Following  Follow up: 6 months for recheck Orders Placed This Encounter  Procedures   CBC with Differential/Platelet   Comprehensive metabolic panel   Lipid panel   Hemoglobin A1c   TSH   Microalbumin / creatinine urine ratio   VITAMIN D 25 Hydroxy (Vit-D Deficiency, Fractures)   No orders of the defined types were placed in this encounter.     Body mass index is 23.14 kg/m. 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     Hypothyroidism due to Hashimoto's thyroiditis 02/13/2015     Priority: High   History of breast cancer left, 2016 02/13/2015     Priority: High    Left, 2016; s/p lumpectomy and rads TX and tamoxifen;     Multiple pulmonary nodules 06/20/2019     Priority: Medium     Left; incidental on CT 05/2019; rec repeat in 05/2020 due to h/o primary malignancy. Repeat 04/2020 benign nodules. No metastatic disease or change. Nothing further recommended.    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     Priority: Medium    Anxiety 09/29/2017     Priority: Medium    Dysfunctional voiding of urine 09/13/2016     Priority: Medium    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  Priority: Medium    Gastroesophageal reflux disease 09/13/2016     Priority: Medium    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     Priority: Medium    Irritable bowel syndrome with both constipation and diarrhea 02/13/2015     Priority: Medium    Pelvic floor dysfunction 08/26/2018     Priority: Low   Female bladder prolapse 04/07/2018     Priority: Low   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 See media section    Seasonal and perennial allergic rhinitis 02/01/2018     Priority: Low   Allergic conjunctivitis of both eyes 03/07/2015     Priority: Low    Noted 12/26/13    Atrophic vaginitis 02/13/2015     Priority: Low   Recurrent UTI 02/13/2015     Priority: Low   Vitamin D deficiency disease 02/13/2015     Priority: Low   Health Maintenance  Topic Date Due   Diabetic kidney evaluation - Urine ACR  08/09/2022   DEXA SCAN  09/21/2022   Diabetic kidney evaluation - eGFR measurement  03/04/2023   COVID-19  Vaccine (5 - 2024-25 season) 05/28/2023 (Originally 11/29/2022)   MAMMOGRAM  09/24/2023   Medicare Annual Wellness (AWV)  03/14/2024   DTaP/Tdap/Td (3 - Td or Tdap) 01/06/2032   Pneumonia Vaccine 42+ Years old  Completed   INFLUENZA VACCINE  Completed   Zoster Vaccines- Shingrix  Completed   HPV VACCINES  Aged Out   Immunization History  Administered Date(s) Administered   Fluad Quad(high Dose 65+) 12/16/2018, 01/01/2020, 01/01/2021, 12/25/2021   Influenza, High Dose Seasonal PF 01/05/2015   Influenza,inj,Quad PF,6+ Mos 01/10/2018   Influenza-Unspecified 01/13/2023   Moderna Sars-Covid-2 Vaccination 04/10/2019, 05/08/2019   PFIZER(Purple Top)SARS-COV-2 Vaccination 02/02/2020   Pfizer Covid-19 Vaccine Bivalent Booster 81yrs & up 01/01/2021   Pneumococcal Conjugate-13 01/16/2014   Pneumococcal Polysaccharide-23 12/10/2007, 01/29/2015   Respiratory Syncytial Virus Vaccine,Recomb Aduvanted(Arexvy) 12/16/2021   Td 01/05/2022   Tdap 01/05/2022   Zoster Recombinant(Shingrix) 10/15/2017, 01/12/2018, 02/17/2018   We updated and reviewed the patient's past history in detail and it is documented below. Allergies: Patient is allergic to cefdinir, cholestyramine, contrast media [iodinated contrast media], ezetimibe, meperidine, nitrofurantoin, septra [sulfamethoxazole-trimethoprim], statins, and teriparatide. Past Medical History Patient  has a past medical history of Arthritis, Breast cancer (HCC), Colon cancer (HCC), Diabetes mellitus without complication (HCC), Hyperlipidemia, Nocturnal leg cramps (11/13/2019), Parkinson disease (HCC), REM sleep behavior disorder (07/14/2018), RLS (restless legs syndrome) (07/14/2018), Scoliosis, 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HCC), and Skin cancer (07/2022). Past Surgical History Patient  has a past surgical history that includes Tonsillectomy; Hemorrhoid surgery; laparoscopy; Bunionectomy; Tubal ligation; Colon surgery; Oophorectomy; Nasal  sinus surgery; Adenoidectomy; and Breast lumpectomy (Left). Family History: Patient family history includes Arthritis in her father, mother, and sister; Breast cancer in her sister; Cancer in her brother, father, sister, sister, and sister; Depression in her father; Diabetes in her mother and sister; Heart disease in her mother; Hyperlipidemia in her brother and sister; Hypertension in her mother; Osteoporosis in her father. Social History:  Patient  reports that she has never smoked. She has never been exposed to tobacco smoke. She has never used smokeless tobacco. She reports current alcohol use. She reports that she does not use drugs.  Review of Systems: Constitutional: negative for fever or malaise Ophthalmic: negative for photophobia, double vision or loss of vision Cardiovascular: negative for chest pain, dyspnea  on exertion, or new LE swelling Respiratory: negative for SOB or persistent cough Gastrointestinal: negative for abdominal pain, change in bowel habits or melena Genitourinary: negative for dysuria or gross hematuria, no abnormal uterine bleeding or disharge Musculoskeletal: negative for new gait disturbance or muscular weakness Integumentary: negative for new or persistent rashes, no breast lumps Neurological: negative for TIA or stroke symptoms Psychiatric: negative for SI or delusions Allergic/Immunologic: negative for hives  Patient Care Team    Relationship Specialty Notifications Start End  Willow Ora, MD PCP - General Family Medicine  04/07/22   York Spaniel, MD (Inactive) Consulting Physician Neurology  04/12/18   Bobbitt, Heywood Iles, MD Consulting Physician Allergy and Immunology  01/17/19   Mansouraty, Netty Starring., MD Consulting Physician Gastroenterology  01/27/19   Malachy Mood, MD Consulting Physician Oncology  03/17/19   Sedalia Muta, PT Physical Therapist Physical Therapy  08/02/19   Vivi Barrack, DPM Consulting Physician Podiatry  10/17/19    Huston Foley, MD Attending Physician Neurology  02/05/21   Erroll Luna, [facility] (Inactive) Pharmacist Pharmacist  06/17/21    Comment: [phone]  Charleen Kirks, MD Referring Physician Family Medicine  10/21/21     Objective  Vitals: BP 116/70   Pulse 84   Temp 97.7 F (36.5 C)   Ht 5\' 4"  (1.626 m)   Wt 134 lb 12.8 oz (61.1 kg)   SpO2 97%   BMI 23.14 kg/m  General:  Well developed, well nourished, no acute distress, kyphotic Psych:  Alert and orientedx3,normal mood and affect HEENT:  Normocephalic, atraumatic, non-icteric sclera,  supple neck without adenopathy, mass or thyromegaly Cardiovascular:  Normal S1, S2, RRR without gallop, rub or murmur Respiratory:  Good breath sounds bilaterally, CTAB with normal respiratory effort Gastrointestinal: normal bowel sounds, soft, non-tender, no noted masses. No HSM MSK: extremities without edema, joints without erythema or swelling Neurologic:    Mental status is normal.  Gross motor and sensory exams are normal.  + tremor  Commons side effects, risks, benefits, and alternatives for medications and treatment plan prescribed today were discussed, and the patient expressed understanding of the given instructions. Patient is instructed to call or message via MyChart if he/she has any questions or concerns regarding our treatment plan. No barriers to understanding were identified. We discussed Red Flag symptoms and signs in detail. Patient expressed understanding regarding what to do in case of urgent or emergency type symptoms.  Medication list was reconciled, printed and provided to the patient in AVS. Patient instructions and summary information was reviewed with the patient as documented in the AVS. This note was prepared with assistance of Dragon voice recognition software. Occasional wrong-word or sound-a-like substitutions may have occurred due to the inherent limitations of voice recognition software

## 2023-05-12 NOTE — Patient Instructions (Addendum)
Please return in 6 months for recheck  I will release your lab results to you on your MyChart account with further instructions. You may see the results before I do, but when I review them I will send you a message with my report or have my assistant call you if things need to be discussed. Please reply to my message with any questions. Thank you!   If you have any questions or concerns, please don't hesitate to send me a message via MyChart or call the office at [phone]. Thank you for visiting with [place] today! It's our pleasure caring for you.   VISIT SUMMARY:  Today, we conducted your annual physical exam and discussed several health concerns, including your osteoporosis, recurrent urinary tract infections, bowel issues, and blood sugar levels. We also reviewed your general health maintenance and scheduled necessary follow-up tests.  YOUR PLAN:  -OSTEOPOROSIS: Osteoporosis is a condition where bones become weak and brittle. You recently resumed Prolia injections to strengthen your bones, and we will order a follow-up bone density scan to assess your response to the treatment.  -RECURRENT URINARY TRACT INFECTIONS: You have had multiple urinary tract infections in the past year. We discussed the possibility of using preventive antibiotics, but you are concerned about side effects. We will continue to monitor your condition and may refer you back to the urogynecologist if the infections persist.  -MAMMOGRAM: A mammogram is an X-ray of the breast used to screen for breast cancer. You are due for a follow-up mammogram in June, and we will coordinate this with your bone density scan if possible.  -BOWEL ISSUES: You experience multiple bowel movements in the morning, with the first being normal and subsequent ones smaller and more frequent. You are currently managing this with Metamucil. We will review your chart to determine if another referral to a different gastroenterologist is  needed.  -HYPERGLYCEMIA: Hyperglycemia means high blood sugar levels. Your last A1C test indicated prediabetes. We will order labs to check your A1C, kidney and liver function, cholesterol, blood counts, and thyroid levels.  -GENERAL HEALTH MAINTENANCE: Continue your regular walking for exercise, as it is beneficial for your overall health.  INSTRUCTIONS:  Please schedule your follow-up bone density scan and mammogram in June. Additionally, we will need to conduct lab tests to check your A1C, kidney and liver function, cholesterol, blood counts, and thyroid levels. If your urinary tract infections persist, we may refer you back to the urogynecologist.

## 2023-05-13 LAB — MICROALBUMIN / CREATININE URINE RATIO
Creatinine,U: 68.3 mg/dL
Microalb Creat Ratio: 22.5 mg/g (ref 0.0–30.0)
Microalb, Ur: 1.5 mg/dL (ref 0.0–1.9)

## 2023-05-19 ENCOUNTER — Encounter: Payer: Self-pay | Admitting: Family Medicine

## 2023-05-19 NOTE — Progress Notes (Signed)
See mychart note Dear Ms. Sampey, We have checked all of your blood work and I'm happy to report that your results look good. Your diabetes is well controlled by diet. Your thyroid and vit D levels are good. Your kidney and liver tests remain normal.  No changes are needed at this time.  Sincerely, Dr. Mardelle Matte

## 2023-05-21 ENCOUNTER — Other Ambulatory Visit (HOSPITAL_COMMUNITY)
Admission: RE | Admit: 2023-05-21 | Discharge: 2023-05-21 | Disposition: A | Discharge status: Home / Self Care | Payer: Medicare HMO | Source: Ambulatory Visit | Attending: Obstetrics and Gynecology | Admitting: Obstetrics and Gynecology

## 2023-05-21 ENCOUNTER — Ambulatory Visit: Payer: Medicare HMO

## 2023-05-21 VITALS — BP 135/85 | HR 94

## 2023-05-21 DIAGNOSIS — R82998 Other abnormal findings in urine: Secondary | ICD-10-CM | POA: Insufficient documentation

## 2023-05-21 DIAGNOSIS — R350 Frequency of micturition: Secondary | ICD-10-CM | POA: Diagnosis not present

## 2023-05-21 DIAGNOSIS — R319 Hematuria, unspecified: Secondary | ICD-10-CM

## 2023-05-21 LAB — URINALYSIS, ROUTINE W REFLEX MICROSCOPIC
Bilirubin Urine: NEGATIVE
Glucose, UA: NEGATIVE mg/dL
Ketones, ur: 5 mg/dL — AB
Nitrite: NEGATIVE
Protein, ur: 30 mg/dL — AB
Specific Gravity, Urine: 1.023 (ref 1.005–1.030)
WBC, UA: >50 WBC/hpf (ref 0–5)
pH: 6 (ref 5.0–8.0)

## 2023-05-21 LAB — POCT URINALYSIS DIPSTICK
Bilirubin, UA: NEGATIVE
Glucose, UA: NEGATIVE
Ketones, UA: NEGATIVE
Nitrite, UA: NEGATIVE
Protein, UA: POSITIVE — AB
Spec Grav, UA: 1.02 (ref 1.010–1.025)
Urobilinogen, UA: 0.2 U/dL
pH, UA: 6.5 (ref 5.0–8.0)

## 2023-05-21 MED ORDER — FOSFOMYCIN TROMETHAMINE 3 G PO PACK: 3.0000 g | PACK | Freq: Once | ORAL | 0 refills | Status: AC

## 2023-05-21 NOTE — Progress Notes (Signed)
Julia [name] is a 83 y.o. female arrived today with UTI sx.  Per Dr. Jari Favre protocol: A urine specimen was collected and POCT Urine was done and urine culture sent to the lab. POCT Urine was POSITIVE for Blood and Leukocytes Pt was notified and prescription ( Fosfomycin 3g)sent to the preferred pharmacy per DR. Schroeder's order.

## 2023-05-21 NOTE — Patient Instructions (Signed)
Your Urine dip that was done in office was Positive. I am sending the urine off for culture and you can take AZO over the counter for your discomfort.  We have ordered Fosfomycin 3g for you to take while we wait for your culture results, hopefully this gives you some relief. We will contact you when the results are back between 3-5 days.  If a different antibiotic is needed we will sent the order to the pharmacy and you will be notified. If you have any questions or concerns please feel free to call us at [phone]

## 2023-05-22 LAB — URINE CULTURE: Culture: NO GROWTH

## 2023-05-24 ENCOUNTER — Other Ambulatory Visit: Payer: Self-pay | Admitting: *Deleted

## 2023-05-24 ENCOUNTER — Ambulatory Visit (INDEPENDENT_AMBULATORY_CARE_PROVIDER_SITE_OTHER): Payer: Medicare HMO | Admitting: Family Medicine

## 2023-05-24 ENCOUNTER — Encounter: Payer: Self-pay | Admitting: Family Medicine

## 2023-05-24 VITALS — BP 153/85 | HR 93 | Temp 97.9°F | Ht 64.0 in | Wt 135.4 lb

## 2023-05-24 DIAGNOSIS — R051 Acute cough: Secondary | ICD-10-CM

## 2023-05-24 DIAGNOSIS — N398 Other specified disorders of urinary system: Secondary | ICD-10-CM | POA: Diagnosis not present

## 2023-05-24 DIAGNOSIS — J069 Acute upper respiratory infection, unspecified: Secondary | ICD-10-CM

## 2023-05-24 DIAGNOSIS — R350 Frequency of micturition: Secondary | ICD-10-CM | POA: Diagnosis not present

## 2023-05-24 LAB — POC COVID19 BINAXNOW: SARS Coronavirus 2 Ag: NEGATIVE

## 2023-05-24 LAB — POCT INFLUENZA A/B: Influenza A, POC: NEGATIVE

## 2023-05-24 MED ORDER — CARBIDOPA-LEVODOPA ER 50-200 MG PO TBCR: EXTENDED_RELEASE_TABLET | ORAL | 2 refills | Status: AC

## 2023-05-24 NOTE — Progress Notes (Signed)
 Subjective  CC:  Chief Complaint  Patient presents with   Cough    Cough, sneezing, runny nose.    HPI: Julia [name] is a 83 y.o. female who presents to the office today to address the problems listed above in the chief complaint. Discussed the use of AI scribe software for clinical note transcription with the patient, who gave verbal consent to proceed.  History of Present Illness   Julia [name] is an 83 year old female who presents with symptoms of a viral infection and concerns about a possible UTI. She was referred by Dr. Florian Buff for evaluation of a UTI.  She began experiencing symptoms of a viral infection on Thursday night, describing it as feeling 'absolutely terrible.' By Friday, she felt as though she was developing a urinary tract infection (UTI) in addition to her viral symptoms. She was unable to get an appointment initially but was referred to Dr. Florian Buff, who prescribed a single-dose powder medication, likely fosfomycin, for the UTI. She took the medication, but did not see the prescription name as it was directly handled by the pharmacy. She feels better since the onset of her symptoms and notes that her cold symptoms are improving. She mentions experiencing a sound when exhaling at night, which caused some concern, but she does not feel short of breath or have tightness in her chest. No fever, and her body aches and fatigue are improving.  I reviewed nurse visit for uro-gyn, positive dipstick but urine culture was no growth. No urinary sxs now.  She is concerned about attending a family funeral next Wednesday, as she does not want to spread any illness to the elderly attendees. She has difficulty sleeping, getting only two hours of sleep last night due to coughing.  Her husband is also ill, with symptoms she describes as 'four times worse' than hers. She is concerned about reinfection but is reassured that she should be building immunity to the virus.        Assessment  1. Viral URI with cough   2. Acute cough   3. Urinary frequency   4. Dysfunctional voiding of urine      Plan  Assessment and Plan    Upper Respiratory Tract Infection   Symptoms improving. No fever, wheezing, or signs of pneumonia on examination. Negative for COVID and flu.   -Self-care, hydration, and over-the-counter cough medicine as needed.    Symptoms of Urinary Tract Infection  with negative culture Treated with Fosfomycin. Urine culture negative.   -No further action needed as urine culture was negative.   -has known voiding dysfunction. No sxs now   Orders Placed This Encounter  Procedures   POCT Influenza A/B   POC COVID-19   No orders of the defined types were placed in this encounter.    I reviewed the patients updated PMH, FH, and SocHx.    Patient Active Problem List   Diagnosis Date Noted   Stage 3a chronic kidney disease (HCC) 05/12/2023    Priority: High   Statin intolerance 10/13/2018    Priority: High   Parkinson's disease (HCC) 09/29/2017    Priority: High   Age-related osteoporosis without current pathological fracture 07/20/2016    Priority: High   Mixed hyperlipidemia 03/07/2015    Priority: High   Impaired fasting glucose 03/07/2015    Priority: High   History of colon cancer 02/13/2015    Priority: High   Hypothyroidism due to Hashimoto's thyroiditis 02/13/2015    Priority: High  History of breast cancer left, 2016 02/13/2015    Priority: High   Multiple pulmonary nodules 06/20/2019    Priority: Medium    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    Priority: Medium    Anxiety 09/29/2017    Priority: Medium    Dysfunctional voiding of urine 09/13/2016    Priority: Medium    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    Priority: Medium    Gastroesophageal reflux disease 09/13/2016    Priority: Medium    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     Priority: Medium    Irritable bowel syndrome with both constipation and diarrhea 02/13/2015    Priority: Medium    Pelvic floor dysfunction 08/26/2018    Priority: Low   Female bladder prolapse 04/07/2018    Priority: Low   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   Current Facility-Administered Medications for the 05/24/23 encounter (Office Visit) with Willow Ora, MD  Medication   denosumab (PROLIA) injection 60 mg    Allergies: Patient is allergic to cefdinir, cholestyramine, contrast media [iodinated contrast media], ezetimibe, meperidine, nitrofurantoin, septra [sulfamethoxazole-trimethoprim], statins, and teriparatide. Family History: Patient family history includes Arthritis in her father, mother, and sister; Breast cancer in her sister; Cancer in her brother, father, sister, sister, and sister; Depression in her father; Diabetes in her mother and sister; Heart disease in her mother; Hyperlipidemia in her brother and sister; Hypertension in her mother; Osteoporosis in her father. Social History:  Patient  reports that she has never smoked. She has never been exposed to tobacco smoke. She has never used smokeless tobacco. She reports current alcohol use. She reports that she does not use drugs.  Review of Systems: Constitutional: Negative for fever malaise or anorexia Cardiovascular: negative for chest pain Respiratory: negative for SOB or severe cough Gastrointestinal: negative for abdominal pain  Objective  Vitals: BP  (!) 153/85   Pulse 93   Temp 97.9 F (36.6 C)   Ht 5\' 4"  (1.626 m)   Wt 135 lb 6.4 oz (61.4 kg)   SpO2 97%   BMI 23.24 kg/m  General: no acute distress , A&Ox3, appears well HEENT: PEERL, conjunctiva normal, neck is supple Cardiovascular:  RRR without murmur or gallop.  Respiratory:  Good breath sounds bilaterally, CTAB with normal respiratory effort Skin:  Warm, no rashes  Office Visit on 05/24/2023  Component Date Value Ref Range Status   Influenza A, POC 05/24/2023 Negative  Negative Final   SARS Coronavirus 2 Ag 05/24/2023 Negative  Negative Final    Commons side effects, risks, benefits, and alternatives for medications and treatment plan prescribed today were discussed, and the patient expressed understanding of the given instructions. Patient is instructed to call or message via MyChart if he/she has any questions or concerns regarding our treatment plan. No barriers to understanding were identified. We discussed Red Flag symptoms and signs in detail. Patient expressed understanding regarding what to  do in case of urgent or emergency type symptoms.  Medication list was reconciled, printed and provided to the patient in AVS. Patient instructions and summary information was reviewed with the patient as documented in the AVS. This note was prepared with assistance of Dragon voice recognition software. Occasional wrong-word or sound-a-like substitutions may have occurred due to the inherent limitations of voice recognition software

## 2023-05-24 NOTE — Telephone Encounter (Signed)
 [facility] sent a fax regarding the directions on carbidopa-leodopa 50/200 mg tablet Rx sent on 02/23/23.  The directions on Rx say " Patient Sig: Take 1 tablet by mouth in the morning, at noon, in the evening, and at bedtime. 7 AM, 11 PM, 3 PM and 7 PM daily."  Per note on 02/23/23 directions should be " Recommend trying increased dose of Sinemet 1 tablet 4 times daily - start at 7am, then repeat at 11am, 3pm and 7pm "  New Rx sent with directions from office note.

## 2023-05-24 NOTE — Patient Instructions (Signed)
Please follow up if symptoms do not improve or as needed.   

## 2023-06-03 ENCOUNTER — Ambulatory Visit: Payer: Medicare HMO | Admitting: Obstetrics and Gynecology

## 2023-06-11 ENCOUNTER — Emergency Department (HOSPITAL_BASED_OUTPATIENT_CLINIC_OR_DEPARTMENT_OTHER)

## 2023-06-11 ENCOUNTER — Encounter (HOSPITAL_BASED_OUTPATIENT_CLINIC_OR_DEPARTMENT_OTHER): Payer: Self-pay | Admitting: Emergency Medicine

## 2023-06-11 ENCOUNTER — Other Ambulatory Visit: Payer: Self-pay

## 2023-06-11 ENCOUNTER — Emergency Department (HOSPITAL_BASED_OUTPATIENT_CLINIC_OR_DEPARTMENT_OTHER): Admission: EM | Admit: 2023-06-11 | Discharge: 2023-06-11 | Disposition: A | Discharge status: Home / Self Care

## 2023-06-11 DIAGNOSIS — S0990XA Unspecified injury of head, initial encounter: Secondary | ICD-10-CM | POA: Diagnosis present

## 2023-06-11 DIAGNOSIS — S060XAA Concussion with loss of consciousness status unknown, initial encounter: Secondary | ICD-10-CM | POA: Insufficient documentation

## 2023-06-11 DIAGNOSIS — H532 Diplopia: Secondary | ICD-10-CM | POA: Insufficient documentation

## 2023-06-11 DIAGNOSIS — W010XXA Fall on same level from slipping, tripping and stumbling without subsequent striking against object, initial encounter: Secondary | ICD-10-CM | POA: Insufficient documentation

## 2023-06-11 DIAGNOSIS — R7309 Other abnormal glucose: Secondary | ICD-10-CM | POA: Diagnosis not present

## 2023-06-11 DIAGNOSIS — G20C Parkinsonism, unspecified: Secondary | ICD-10-CM | POA: Diagnosis not present

## 2023-06-11 DIAGNOSIS — S060X9A Concussion with loss of consciousness of unspecified duration, initial encounter: Secondary | ICD-10-CM

## 2023-06-11 LAB — BASIC METABOLIC PANEL
Anion gap: 7 (ref 5–15)
BUN: 25 mg/dL — ABNORMAL HIGH (ref 8–23)
CO2: 29 mmol/L (ref 22–32)
Calcium: 9.3 mg/dL (ref 8.9–10.3)
Chloride: 102 mmol/L (ref 98–111)
Creatinine, Ser: 0.94 mg/dL (ref 0.44–1.00)
GFR, Estimated: >60 mL/min (ref >60)
Glucose, Bld: 134 mg/dL — ABNORMAL HIGH (ref 70–99)
Potassium: 3.6 mmol/L (ref 3.5–5.1)
Sodium: 138 mmol/L (ref 135–145)

## 2023-06-11 LAB — CBG MONITORING, ED: Glucose-Capillary: 112 mg/dL — ABNORMAL HIGH (ref 70–99)

## 2023-06-11 LAB — CBC WITH DIFFERENTIAL/PLATELET
Abs Immature Granulocytes: 0.02 10*3/uL (ref 0.00–0.07)
Basophils Absolute: 0.1 10*3/uL (ref 0.0–0.1)
Basophils Relative: 1 %
Eosinophils Absolute: 0.2 10*3/uL (ref 0.0–0.5)
Eosinophils Relative: 3 %
HCT: 39.6 % (ref 36.0–46.0)
Hemoglobin: 12.9 g/dL (ref 12.0–15.0)
Immature Granulocytes: 0 %
Lymphocytes Relative: 15 %
Lymphs Abs: 1.1 10*3/uL (ref 0.7–4.0)
MCH: 29.9 pg (ref 26.0–34.0)
MCHC: 32.6 g/dL (ref 30.0–36.0)
MCV: 91.9 fL (ref 80.0–100.0)
Monocytes Absolute: 0.7 10*3/uL (ref 0.1–1.0)
Monocytes Relative: 10 %
Neutro Abs: 5 10*3/uL (ref 1.7–7.7)
Neutrophils Relative %: 71 %
Platelets: 289 10*3/uL (ref 150–400)
RBC: 4.31 MIL/uL (ref 3.87–5.11)
RDW: 14 % (ref 11.5–15.5)
WBC: 7 10*3/uL (ref 4.0–10.5)
nRBC: 0 % (ref 0.0–0.2)

## 2023-06-11 NOTE — ED Triage Notes (Signed)
 Fall on Tuesday Tripped over a curb [place] and hit left hand and face  Seen at [facility] broken middle finger with ortho/hand specialist  Now having confusion, loss of balance, seeing double (next to each other)  Has parkinson  Last normal was a few days ago

## 2023-06-11 NOTE — Discharge Instructions (Signed)
 Please call your neurologist to schedule follow-up.  Your workup here in the ER today was reassuring.  If you develop new or worsening symptoms over the weekend please go to our main ER at [facility] for reevaluation.  Return to the ER for worsening symptoms.

## 2023-06-11 NOTE — ED Provider Notes (Signed)
 [place] EMERGENCY DEPARTMENT AT [facility] Provider Note   CSN: [id number] Arrival date & time: 06/11/23  1717     History  Chief Complaint  Patient presents with   Altered Mental Status    Julia [name] is a 83 y.o. female.  83 year old female with past medical history of Parkinson's disease presenting to the emergency department today with headache, blurred vision, and dizziness intermittently since she fell on Monday.  The patient was in [place] traveling when she tripped and fell.  She did hit her head.  Did not lose consciousness.  She does have a fracture on one of the fingers on her left hand and has an appoint with orthopedics to follow-up regarding this.  The patient states that she has been having the symptoms now for the past few days and did have an episode of diplopia yesterday which lasted for a few hours.  She came in today for further evaluation regarding this.  She is not on any blood thinners.   Altered Mental Status      Home Medications Prior to Admission medications   Medication Sig Start Date End Date Taking? Authorizing Provider  Acetaminophen (TYLENOL PO) Take by mouth as needed.    [provider]  Calcium Citrate-Vitamin D 315-250 MG-UNIT TABS Take by mouth.    [provider]  carbidopa-levodopa (SINEMET CR) 50-200 MG tablet Take 1 tablet 4 times daily - start at 7am, then repeat at 11am, 3pm and 7pm 05/24/23   Ihor Austin, NP  carboxymethylcellulose (REFRESH PLUS) 0.5 % SOLN 1 drop 3 (three) times daily as needed.    [provider]  denosumab (PROLIA) 60 MG/ML SOSY injection Inject 60 mg into the skin every 6 (six) months. 03/22/23   Willow Ora, MD  diphenhydrAMINE (BENADRYL) 12.5 MG chewable tablet Chew 12.5 mg by mouth every 6 (six) hours as needed.    [provider]  estradiol (ESTRACE) 0.1 MG/GM vaginal cream Place 0.5g twice a week at night 01/28/23   Marguerita Beards, MD   levothyroxine (SYNTHROID) 88 MCG tablet TAKE ONE TABLET BY MOUTH DAILY FOR BREAKFAST 03/22/23   Willow Ora, MD  Multiple Vitamins-Minerals (CENTRUM SILVER PO) Take by mouth.    [provider]  Polyvinyl Alcohol-Povidone (REFRESH OP) Apply to eye.    [provider]  pramipexole (MIRAPEX) 0.25 MG tablet TAKE 1 TABLET BY MOUTH 2 HOURS BEFORE BEDTIME AND 1 TABLET AT BEDTIME. 05/11/23   Huston Foley, MD  Probiotic Product (PROBIOTIC DAILY PO) Take by mouth.    [provider]  psyllium (HYDROCIL/METAMUCIL) 95 % PACK Take 1 packet by mouth daily.    [provider]  SALINE NASAL MIST NA Place into the nose.    [provider]      Allergies    Cefdinir, Cholestyramine, Contrast media [iodinated contrast media], Ezetimibe, Meperidine, Nitrofurantoin, Septra [sulfamethoxazole-trimethoprim], Statins, and Teriparatide    Review of Systems   Review of Systems  Eyes:  Positive for visual disturbance.  Neurological:  Positive for dizziness.  All other systems reviewed and are negative.   Physical Exam Updated Vital Signs BP (!) 143/123 (BP Location: Right Arm)   Pulse 88   Temp 97.9 F (36.6 C)   Resp 16   SpO2 100%  Physical Exam Vitals and nursing note reviewed.   Gen: NAD Eyes: PERRL, EOMI, no diplopia noted objectively on exam in all 4 quadrants HEENT: no oropharyngeal swelling Neck: trachea midline Resp: clear to  auscultation bilaterally Card: RRR, no murmurs, rubs, or gallops Abd: nontender, nondistended Extremities: no calf tenderness, no edema Vascular: 2+ radial pulses bilaterally, 2+ DP pulses bilaterally Neuro: cranial nerves intact, equal strength and sensation throughout with normal finger to nose testing Skin: no rashes Psyc: acting appropriately   ED Results / Procedures / Treatments   Labs (all labs ordered are listed, but only abnormal results are displayed) Labs Reviewed  BASIC METABOLIC PANEL - Abnormal;  Notable for the following components:      Result Value   Glucose, Bld 134 (*)    BUN 25 (*)    All other components within normal limits  CBG MONITORING, ED - Abnormal; Notable for the following components:   Glucose-Capillary 112 (*)    All other components within normal limits  CBC WITH DIFFERENTIAL/PLATELET    EKG None  Radiology CT Cervical Spine Wo Contrast Result Date: 06/11/2023 CLINICAL DATA:  Neck trauma.  Altered mental status. EXAM: CT CERVICAL SPINE WITHOUT CONTRAST TECHNIQUE: Multidetector CT imaging of the cervical spine was performed without intravenous contrast. Multiplanar CT image reconstructions were also generated. RADIATION DOSE REDUCTION: This exam was performed according to the departmental dose-optimization program which includes automated exposure control, adjustment of the mA and/or kV according to patient size and/or use of iterative reconstruction technique. COMPARISON:  MRI 04/24/2021 FINDINGS: Alignment: Chronic degenerative anterolisthesis at C3-4 of 3 mm. Skull base and vertebrae: No regional fracture or focal bone lesion. Chronic fusion of the facet joints on the right at C2-3. 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 No apparent compressive canal stenosis. Mild bony foraminal narrowing throughout that region. Facet arthropathy is particularly notable on the right at C3-4, which could be painful. Upper chest: Negative Other: None IMPRESSION: No acute or traumatic finding. 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 Facet arthropathy is particularly notable on the right at C3-4, which could be painful. Electronically Signed   By: Paulina Fusi M.D.   On: 06/11/2023 18:01   CT Head Wo Contrast Result Date: 06/11/2023 CLINICAL DATA:  Head trauma, minor.  Altered mental status. EXAM: CT HEAD WITHOUT CONTRAST TECHNIQUE: Contiguous axial images were obtained from the base of the  skull through the vertex without intravenous contrast. RADIATION DOSE REDUCTION: This exam was performed according to the departmental dose-optimization program which includes automated exposure control, adjustment of the mA and/or kV according to patient size and/or use of iterative reconstruction technique. COMPARISON:  None Available. FINDINGS: Brain: Mild age related volume loss. No evidence of old or acute focal infarction, mass lesion, hemorrhage, hydrocephalus or extra-axial collection. Vascular: No abnormal vascular finding. Skull: Negative Sinuses/Orbits: Seasonal mucosal inflammatory changes of the paranasal sinuses. Previous FESS. Other: None IMPRESSION: 1. No acute or traumatic finding. Mild age related volume loss. 2. Previous FESS.  Mucosal inflammatory changes of the sinuses. Electronically Signed   By: Paulina Fusi M.D.   On: 06/11/2023 18:00    Procedures Procedures    Medications Ordered in ED Medications - No data to display  ED Course/ Medical Decision Making/ A&P                                 Medical Decision Making 83 year old female with past medical history of Parkinson's disease and hypertension presenting to the emergency department today with concern for headache, blurred vision, and diplopia yesterday.  CT scans  were ordered at triage.  These do not show any acute findings.  Her neurologic exam is reassuring here.  I did call and discussed this with Dr. Iver Nestle.  She felt that the diplopia may not be related to concussion since the symptoms started 3 days after her fall.  She recommended having a conversation with the patient since her symptoms have resolved in regards to the diplopia to decide on whether or not she would need an emergent MRI at this time or not.  We do not have MRI capabilities at this location currently.  I did discuss this with the patient and her husband.  Since her symptoms have resolved she would ultimately like to go home and follow-up with her  neurologist through shared decision making.  She is encouraged to follow-up over the weekend if her symptoms worsen and go to our main ER as needed.  I do think this is reasonable.  Her blood pressures are mildly elevated here in the 150s over 80s on reassessment.  She remains well-appearing with reassuring neuroexam.  She is discharged with return precautions.  Amount and/or Complexity of Data Reviewed Labs: ordered. Radiology: ordered.           Final Clinical Impression(s) / ED Diagnoses Final diagnoses:  Concussion with loss of consciousness, initial encounter  Diplopia    Rx / DC Orders ED Discharge Orders     None         Durwin Glaze, MD 06/11/23 1925

## 2023-06-15 DIAGNOSIS — M79642 Pain in left hand: Secondary | ICD-10-CM | POA: Insufficient documentation

## 2023-06-15 DIAGNOSIS — S62619A Displaced fracture of proximal phalanx of unspecified finger, initial encounter for closed fracture: Secondary | ICD-10-CM | POA: Insufficient documentation

## 2023-06-16 ENCOUNTER — Encounter: Payer: Self-pay | Admitting: Family Medicine

## 2023-06-16 ENCOUNTER — Ambulatory Visit: Admitting: Family Medicine

## 2023-06-16 VITALS — BP 132/81 | HR 99 | Temp 98.0°F | Ht 64.0 in | Wt 133.8 lb

## 2023-06-16 DIAGNOSIS — S060X0D Concussion without loss of consciousness, subsequent encounter: Secondary | ICD-10-CM

## 2023-06-16 DIAGNOSIS — H532 Diplopia: Secondary | ICD-10-CM

## 2023-06-16 DIAGNOSIS — G20A2 Parkinson's disease without dyskinesia, with fluctuations: Secondary | ICD-10-CM | POA: Diagnosis not present

## 2023-06-16 NOTE — Patient Instructions (Signed)
 Please follow up as scheduled for your next visit with me: 11/09/2023   If you have any questions or concerns, please don't hesitate to send me a message via MyChart or call the office at [phone]. Thank you for visiting with [place] today! It's our pleasure caring for you.   VISIT SUMMARY:  Today, we discussed the symptoms you have been experiencing following your recent fall. You sustained a concussion and have been feeling unsteady, nauseated, and having difficulty with word-finding. We also reviewed your Parkinson's disease management and the impact it has on your recovery. Additionally, we addressed your risk of falls and provided recommendations to help prevent future incidents.  YOUR PLAN:  -CONCUSSION: A concussion is a mild traumatic brain injury that can cause symptoms like unsteadiness, nausea, and difficulty with word-finding. Your CT scan was normal, which is reassuring. To aid your recovery, it is important to rest and avoid overexertion. Monitor your symptoms for the next 1-2 weeks, and if they do not improve, we may consider further evaluation with neurology.  -PARKINSON'S DISEASE: Parkinson's disease is a neurological disorder that affects movement and can cause muscle stiffness and balance issues. Your current medication, carbidopa-levodopa, helps manage these symptoms but can cause nightmares and sleep disturbances. We will maintain your current medication regimen for now and reassess your symptoms after you have recovered from the concussion.  -FALL RISK: Due to the muscle stiffness caused by Parkinson's disease, you are at an increased risk of falling. To help prevent future falls, it is recommended that you use a cane.  INSTRUCTIONS:  Please monitor your symptoms for the next 1-2 weeks and ensure you get plenty of rest. If your symptoms do not improve, contact us to discuss the possibility of further imaging. Continue taking your current Parkinson's medication and use a cane to  help prevent falls. Follow up with [place] after your concussion symptoms have improved.

## 2023-06-16 NOTE — Progress Notes (Signed)
 Subjective  CC:  Chief Complaint  Patient presents with   Hospitalization Follow-up    06/11/2023 (1 hours) [facility] Emergency Department at [facility]   Concussion without loss of consciousness   Diplopia         HPI: Julia [name] is a 83 y.o. female who presents to the office today to address the problems listed above in the chief complaint. Discussed the use of AI scribe software for clinical note transcription with the patient, who gave verbal consent to proceed.  History of Present Illness   Julia [name] is an 83 year old female with Parkinson's disease who presents with symptoms following a fall. She is accompanied by her husband, Renae Fickle. I reviewed ED notes, brain CT and recommendations  She experienced a fall at a highway rest stop, tripping over a curb and hitting her head on the concrete, resulting in a fracture in her left hand. A CT scan at the emergency room showed no acute findings. Since the fall, she has felt nauseated, unbalanced, and has had difficulty with word-finding and sentence construction. She describes her head as 'swirling inside' and feels 'off kilter.' No new severe headaches or progressive double vision have occurred since the fall.  She experienced intermittent double vision following the fall, which has now resolved, but she continues to have occasional blurriness. Her glasses were damaged in the fall, which may have affected her vision.  She has Parkinson's disease and is taking carbidopa-levodopa, which causes nightmares and sleep disturbances. Increasing the dose exacerbates these symptoms. She attributes some of her current symptoms to both the fall and her Parkinson's disease.  She has a history of falls, with this being the third incident, attributed to stiffness in her leg muscles due to Parkinson's disease.  She takes cranberry capsules daily to prevent urinary tract infections, which she has had in the past, but has not experienced  any recently.       Assessment  1. Concussion without loss of consciousness, subsequent encounter   2. Diplopia   3. Parkinson's disease with fluctuating manifestations, unspecified whether dyskinesia present [facility])      Plan  Assessment and Plan    Concussion Sustained a concussion with persistent unsteadiness and word-finding difficulties. CT scan unremarkable. Symptoms expected to improve with rest and avoidance of overexertion. Emphasized patience and self-care during recovery. - Monitor symptoms for 1-2 weeks. -normal cranial nerves today w/o diplopia. - Advise rest and avoid overexertion. - Educate on concussion symptoms and recovery. - Consider neurology eval and/or MRI if symptoms do not improve.  Parkinson's Disease Parkinson's complicates post-concussion assessment. Carbidopa/levodopa causes nightmares and sleep disturbances. Current regimen maintained to differentiate effects. Discussed balancing symptom management and side effects. - Maintain current medication regimen. - Reassess symptoms after concussion recovery.  Fall Risk Multiple falls due to Parkinson's-related muscle stiffness. Advised cane use to prevent further falls. - Recommend use of a cane to prevent falls.     Follow up as scheduled, august.     No orders of the defined types were placed in this encounter.  No orders of the defined types were placed in this encounter.    I reviewed the patients updated PMH, FH, and SocHx.    Patient Active Problem List   Diagnosis Date Noted   Stage 3a chronic kidney disease (HCC) 05/12/2023    Priority: High   Statin intolerance 10/13/2018    Priority: High   Parkinson's disease (HCC) 09/29/2017    Priority: High  Age-related osteoporosis without current pathological fracture 07/20/2016    Priority: High   Mixed hyperlipidemia 03/07/2015    Priority: High   Impaired fasting glucose 03/07/2015    Priority: High   History of colon cancer 02/13/2015     Priority: High   Hypothyroidism due to Hashimoto's thyroiditis 02/13/2015    Priority: High   History of breast cancer left, 2016 02/13/2015    Priority: High   Multiple pulmonary nodules 06/20/2019    Priority: Medium    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    Priority: Medium    Anxiety 09/29/2017    Priority: Medium    Dysfunctional voiding of urine 09/13/2016    Priority: Medium    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    Priority: Medium    Gastroesophageal reflux disease 09/13/2016    Priority: Medium    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    Priority: Medium    Irritable bowel syndrome with both constipation and diarrhea 02/13/2015    Priority: Medium    Pelvic floor dysfunction 08/26/2018    Priority: Low   Female bladder prolapse 04/07/2018    Priority: Low   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   Calcium Citrate-Vitamin D 315-250 MG-UNIT TABS Take by mouth.   carbidopa-levodopa (SINEMET CR) 50-200 MG tablet Take 1 tablet 4 times daily - start at 7am, then repeat at 11am, 3pm and 7pm   carboxymethylcellulose (REFRESH PLUS) 0.5 % SOLN 1 drop 3 (three) times daily as needed.   denosumab (PROLIA) 60 MG/ML SOSY injection Inject 60 mg into the skin every 6 (six) months.   diphenhydrAMINE (BENADRYL) 12.5 MG chewable tablet Chew 12.5 mg by mouth every 6 (six) hours as needed.   estradiol (ESTRACE) 0.1 MG/GM vaginal cream Place 0.5g twice a week at night   levothyroxine (SYNTHROID) 88  MCG tablet TAKE ONE TABLET BY MOUTH DAILY FOR BREAKFAST   Multiple Vitamins-Minerals (CENTRUM SILVER PO) Take by mouth.   Polyvinyl Alcohol-Povidone (REFRESH OP) Apply to eye.   pramipexole (MIRAPEX) 0.25 MG tablet TAKE 1 TABLET BY MOUTH 2 HOURS BEFORE BEDTIME AND 1 TABLET AT BEDTIME.   Probiotic Product (PROBIOTIC DAILY PO) Take by mouth.   psyllium (HYDROCIL/METAMUCIL) 95 % PACK Take 1 packet by mouth daily.   SALINE NASAL MIST NA Place into the nose.   Current Facility-Administered Medications for the 06/16/23 encounter (Office Visit) with Willow Ora, MD  Medication   denosumab (PROLIA) injection 60 mg    Allergies: Patient is allergic to cefdinir, cholestyramine, contrast media [iodinated contrast media], ezetimibe, meperidine, nitrofurantoin, septra [sulfamethoxazole-trimethoprim], statins, and teriparatide. Family History: Patient family history includes Arthritis in her father, mother, and sister; Breast cancer in her sister; Cancer in her brother, father, sister, sister, and sister; Depression in her father; Diabetes in her mother and sister; Heart disease in her mother; Hyperlipidemia in her brother and sister; Hypertension in her mother; Osteoporosis in her father. Social History:  Patient  reports that she has never smoked. She has never been exposed to tobacco smoke. She has never used smokeless tobacco. She reports current alcohol use. She reports that she does not use drugs.  Review of Systems: Constitutional: Negative for fever malaise or anorexia Cardiovascular: negative for chest pain Respiratory: negative for SOB or persistent cough Gastrointestinal: negative for abdominal pain  Objective  Vitals: BP 132/81   Pulse 99   Temp 98 F (36.7 C)   Ht 5\' 4"  (1.626 m)   Wt 133 lb 12.8 oz (60.7 kg)   SpO2 98%   BMI 22.97 kg/m  General: no acute distress , A&Ox3 Neuro: normal cranial nerves, no diplopia HEENT: PEERL, conjunctiva normal, neck is  supple Cardiovascular:  RRR without murmur or gallop.  Respiratory:  Good breath sounds bilaterally, CTAB with normal respiratory effort   Commons side effects, risks, benefits, and alternatives for medications and treatment plan prescribed today were discussed, and the patient expressed understanding of the given instructions. Patient is instructed to call or message via MyChart if he/she has any questions or concerns regarding our treatment plan. No barriers to understanding were identified. We discussed Red Flag symptoms and signs in detail. Patient expressed understanding regarding what to do in case of urgent or emergency type symptoms.  Medication list was reconciled, printed and provided to the patient in AVS. Patient instructions and summary information was reviewed with the patient as documented in the AVS. This note was prepared with assistance of Dragon voice recognition software. Occasional wrong-word or sound-a-like substitutions may have occurred due to the inherent limitations of voice recognition software

## 2023-07-05 ENCOUNTER — Encounter: Payer: Self-pay | Admitting: Neurology

## 2023-07-05 NOTE — Telephone Encounter (Signed)
 From last visit October 2024 with Shanda Bumps NP: ASSESSMENT/PLAN: Julia [name] is a 83 y.o. year old female with Parkinson's disease with left-sided predominance with concern of gradual worsening     -Recommend increasing Sinemet CR 50-200mg  1 tab to 4 times daily as previously recommended by Dr. Frances Furbish as patient having wearing off symptoms on current TID dosing. Does have difficulty remembering to take dose on time, discussed ways to help with this. -Continue Mirapex 0.25 mg nightly 1-2 hrs prior to bed and 1 at bedtime. Did discuss if she starts to experience increased frequency of tremors that wake her from sleep, can consider use of extra dose as needed -recommend trial of melatonin to help with likely REM sleep behavior disorder -previously discussed trial of medication for neuropathy but declines interest -advised to discuss ongoing anxiety concerns with PCP. Also advised to discuss hair loss concerns with PCP, very low suspicion side effect from Sinemet or Mirapex, would recommend recheck of TSH at f/u visit with PCP

## 2023-07-20 ENCOUNTER — Ambulatory Visit: Payer: Self-pay

## 2023-07-20 ENCOUNTER — Ambulatory Visit: Admitting: Family

## 2023-07-20 NOTE — Telephone Encounter (Signed)
  Chief Complaint: diarrhea Symptoms: diarrhea Frequency: 6 times  Disposition: [] ED /[] Urgent Care (no appt availability in office) / [] Appointment(In office/virtual)/ []  [facility]/ [x] Home Care/ [] Refused Recommended Disposition /[] [place] [facility]/ []  Follow-up with PCP Additional Notes: Pt called to cancel eye appt due to diarrhea this morning. Pt has had roughly 6 BMs today. Pt stated they are formed to very loose each time she goes. Pt stated she has history of colon cancer and this is normal for her. Currently pt shows no signs of dehydration. Pt's left eye is white crusty with blurred vision. Pt not sure if infected or from allergies. Pt stated she would call back and make appt for eye when the diarrhea passes. RN gave home care advice and  pt verbalized understanding.              Copied from CRM [id number]. Topic: Clinical - Pink Word Triage >> Jul 20, 2023  8:06 AM Lotus Round B wrote: Reason for Triage: diarrhea Reason for Disposition  MILD-MODERATE diarrhea (e.g., 1-6 times / day more than normal)  Answer Assessment - Initial Assessment Questions 1. DIARRHEA SEVERITY: "How bad is the diarrhea?" "How many more stools have you had in the past 24 hours than normal?"    - NO DIARRHEA (SCALE 0)   - MILD (SCALE 1-3): Few loose or mushy BMs; increase of 1-3 stools over normal daily number of stools; mild increase in ostomy output.   -  MODERATE (SCALE 4-7): Increase of 4-6 stools daily over normal; moderate increase in ostomy output.   -  SEVERE (SCALE 8-10; OR "WORST POSSIBLE"): Increase of 7 or more stools daily over normal; moderate increase in ostomy output; incontinence.     moderate 2. ONSET: "When did the diarrhea begin?"      A few years 3. BM CONSISTENCY: "How loose or watery is the diarrhea?"      loose 4. VOMITING: "Are you also vomiting?" If Yes, ask: "How many times in the past 24 hours?"      denies 5. ABDOMEN PAIN: "Are you having any abdomen  pain?" If Yes, ask: "What does it feel like?" (e.g., crampy, dull, intermittent, constant)      denies 6. ABDOMEN PAIN SEVERITY: If present, ask: "How bad is the pain?"  (e.g., Scale 1-10; mild, moderate, or severe)   - MILD (1-3): doesn't interfere with normal activities, abdomen soft and not tender to touch    - MODERATE (4-7): interferes with normal activities or awakens from sleep, abdomen tender to touch    - SEVERE (8-10): excruciating pain, doubled over, unable to do any normal activities       denies 7. ORAL INTAKE: If vomiting, "Have you been able to drink liquids?" "How much liquids have you had in the past 24 hours?"     16oz water 8. HYDRATION: "Any signs of dehydration?" (e.g., dry mouth [not just dry lips], too weak to stand, dizziness, new weight loss) "When did you last urinate?"     denies  11. OTHER SYMPTOMS: "Do you have any other symptoms?" (e.g., fever, blood in stool)       Eye irritation  Protocols used: Diarrhea-A-AH

## 2023-07-20 NOTE — Telephone Encounter (Signed)
 FYI see triage note pt refused disposition

## 2023-07-20 NOTE — Telephone Encounter (Signed)
 Noted.

## 2023-07-20 NOTE — Telephone Encounter (Signed)
 Pt states that she has had diarrhea intermittently since her colon cancer dx. Pt states it comes and goes. Pt states that consistency can change in minutes. Pt states that she was also having eye discharge, it has resolved.  Chief Complaint: diarrhea Symptoms: diarrhea Frequency: ongoing, chronic Pertinent Negatives: Patient denies blood in stool, fever, diarrhea, N/V Disposition: [] ED /[] Urgent Care (no appt availability in office) / [x] Appointment(In office/virtual)/ []  [place] [facility]/ [] Home Care/ [x] Refused Recommended Disposition /[] [place] [facility]/ []  Follow-up with PCP Additional Notes: Pt states that she has been on abx for about a week d/t a UTI, pt was then advised that she did not have a UTI and to stop taking the abx. Pt states that her stool recently "has been soft like oatmeal, not diarrhea.". Pt states she does not want an appt at this time. Pt advised to continue to drink fluids.   Reason for Disposition . [1] MODERATE diarrhea (e.g., 4-6 times / day more than normal) AND [2] present > 48 hours (2 days)  Answer Assessment - Initial Assessment Questions 1. DIARRHEA SEVERITY: "How bad is the diarrhea?" "How many more stools have you had in the past 24 hours than normal?"    - NO DIARRHEA (SCALE 0)   - MILD (SCALE 1-3): Few loose or mushy BMs; increase of 1-3 stools over normal daily number of stools; mild increase in ostomy output.   -  MODERATE (SCALE 4-7): Increase of 4-6 stools daily over normal; moderate increase in ostomy output.   -  SEVERE (SCALE 8-10; OR "WORST POSSIBLE"): Increase of 7 or more stools daily over normal; moderate increase in ostomy output; incontinence.     5 BM today, soft stool-per pt not diarrhea 2. ONSET: "When did the diarrhea begin?"      When she was diagnosed with colon, years 3. BM CONSISTENCY: "How loose or watery is the diarrhea?"      soft 4. VOMITING: "Are you also vomiting?" If Yes, ask: "How many times in the past 24 hours?"       denies 5. ABDOMEN PAIN: "Are you having any abdomen pain?" If Yes, ask: "What does it feel like?" (e.g., crampy, dull, intermittent, constant)      denies 7. ORAL INTAKE: If vomiting, "Have you been able to drink liquids?" "How much liquids have you had in the past 24 hours?"     Pt states she is eating/drinking normally, but it does impact her, states she does not want to risk having an accident 8. HYDRATION: "Any signs of dehydration?" (e.g., dry mouth [not just dry lips], too weak to stand, dizziness, new weight loss) "When did you last urinate?"     Pt states she may be a little dehydrated.  9. EXPOSURE: "Have you traveled to a foreign country recently?" "Have you been exposed to anyone with diarrhea?" "Could you have eaten any food that was spoiled?"     denies 10. ANTIBIOTIC USE: "Are you taking antibiotics now or have you taken antibiotics in the past 2 months?" Abx for UTI 11. OTHER SYMPTOMS: "Do you have any other symptoms?" (e.g., fever, blood in stool)       denies  Protocols used: Diarrhea-A-AH

## 2023-07-21 NOTE — Telephone Encounter (Signed)
 Pt wanting to see if anything PCP could help with in office that she wasn't already doing at home. Also just to make PCP aware of symptoms and see if she needed to come into office to be seen.

## 2023-07-27 ENCOUNTER — Ambulatory Visit: Payer: Medicare HMO | Admitting: Obstetrics and Gynecology

## 2023-07-27 VITALS — BP 145/87 | HR 93

## 2023-07-27 DIAGNOSIS — R350 Frequency of micturition: Secondary | ICD-10-CM

## 2023-07-27 DIAGNOSIS — N952 Postmenopausal atrophic vaginitis: Secondary | ICD-10-CM | POA: Diagnosis not present

## 2023-07-27 DIAGNOSIS — R159 Full incontinence of feces: Secondary | ICD-10-CM

## 2023-07-27 DIAGNOSIS — N8110 Cystocele, unspecified: Secondary | ICD-10-CM

## 2023-07-27 NOTE — Patient Instructions (Signed)
 You can do Vitamin E cream, coconut oil, or aloe vera extract in the vagina for support when you are not using the estrogen cream.

## 2023-07-27 NOTE — Progress Notes (Signed)
 [facility] Return Visit  SUBJECTIVE  History of Present Illness: Julia [name] is a 83 y.o. female seen in follow-up for rUTI. Plan at last visit was start vaginal estrogen cream and ensure cultures are done when UTI symptoms felt. Also encouraged to do fiber supplement for stool bulking related to FI.      Past Medical History: Julia [name]  has a past medical history of Arthritis, Breast cancer (HCC), Colon cancer (HCC), Diabetes mellitus without complication (HCC), Hyperlipidemia, Nocturnal leg cramps (11/13/2019), Parkinson disease (HCC), REM sleep behavior disorder (07/14/2018), RLS (restless legs syndrome) (07/14/2018), Scoliosis, 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HCC), and Skin cancer (07/2022).   Past Surgical History: She  has a past surgical history that includes Tonsillectomy; Hemorrhoid surgery; laparoscopy; Bunionectomy; Tubal ligation; Colon surgery; Oophorectomy; Nasal sinus surgery; Adenoidectomy; and Breast lumpectomy (Left).   Medications: She has a current medication list which includes the following prescription(s): acetaminophen , calcium citrate-vitamin d , carbidopa -levodopa , carboxymethylcellulose, denosumab , diphenhydramine, estradiol , levothyroxine , multiple vitamins-minerals, polyvinyl alcohol-povidone, pramipexole , probiotic product, psyllium, and saline, and the following Facility-Administered Medications: denosumab .   Allergies: Julia [name] is allergic to cefdinir, cholestyramine, contrast media [iodinated contrast media], ezetimibe, meperidine, nitrofurantoin , septra  [sulfamethoxazole -trimethoprim ], statins, and teriparatide.   Social History: Julia [name]  reports that she has never smoked. She has never been exposed to tobacco smoke. She has never used smokeless tobacco. She reports current alcohol use. She reports that she does not use drugs.     OBJECTIVE     Physical Exam: Vitals:   07/27/23 1107  BP: (!) 145/87  Pulse: 93   Gen:  No apparent distress, A&O x 3.  Detailed Urogynecologic Evaluation:  Julia [name] has her anterior wall coming past the opening with valsalva. Julia [name] agreed to try a pessary fitting to see if this will help with comfort, bladder emptying and her ability to insert estrogen cream. A #2 Short stem Gellhorn was placed. Julia [name] was able to walk, cough, valsalva, and attempt urination without pessary expulsion.    ASSESSMENT AND PLAN    Julia [name] is a 83 y.o. with:  1. Prolapse of anterior vaginal wall   2. Urinary frequency   3. Incontinence of feces, unspecified fecal incontinence type   4. Vaginal atrophy    Julia [name] was fit with a Short Stem Gellhorn today. Will see how she does with this. Julia [name] is hesitant based on her history with pessaries but is open to trying. She was fit with a #2 Gellhorn pessary today.  Goal is for Julia [name] to not have to go to the bathroom as frequently as she currently is, hopefuly pessary will help her empty her bladder better.  Julia [name] reports she is doing the Metamucil which has helped her have more formed bowel movements.  Julia [name] is not comfortable with the estrogen cream. We discussed that she can do Vitamin E cream, coconut oil or aloe vera in the vagina for atrophic support.   Julia [name] to follow up in 4 weeks or sooner if needed.    Makinzey Banes G Rhylen Shaheen, NP

## 2023-07-28 ENCOUNTER — Telehealth (INDEPENDENT_AMBULATORY_CARE_PROVIDER_SITE_OTHER): Payer: Self-pay | Admitting: Obstetrics and Gynecology

## 2023-07-28 DIAGNOSIS — N814 Uterovaginal prolapse, unspecified: Secondary | ICD-10-CM

## 2023-07-28 NOTE — Telephone Encounter (Signed)
 Called and spoke to patient who reports the pessary is not working well for her. She states it has supported her bladder, but she is having some leakage and she is unsure of her new bowel routine. She states she would like to come and have it removed. Will plan to have the pessary removed.

## 2023-07-29 ENCOUNTER — Ambulatory Visit

## 2023-08-03 ENCOUNTER — Telehealth: Payer: Self-pay | Admitting: Neurology

## 2023-08-03 NOTE — Telephone Encounter (Signed)
 LVM and sent mychart msg informing pt of need to reschedule 08/24/23 appt - MD out

## 2023-08-04 ENCOUNTER — Telehealth: Payer: Self-pay | Admitting: Neurology

## 2023-08-04 NOTE — Telephone Encounter (Signed)
 May 7 at 10:45 am Pt LVM to Reschedule Appt .Aaron Aas  Called PT back , No Answer ,LVM

## 2023-08-09 ENCOUNTER — Other Ambulatory Visit: Payer: Self-pay | Admitting: Family Medicine

## 2023-08-09 ENCOUNTER — Telehealth: Payer: Self-pay | Admitting: Family Medicine

## 2023-08-09 ENCOUNTER — Telehealth: Payer: Self-pay

## 2023-08-09 DIAGNOSIS — Z1231 Encounter for screening mammogram for malignant neoplasm of breast: Secondary | ICD-10-CM

## 2023-08-09 NOTE — Telephone Encounter (Signed)
 Patient returned phone call. Rescheduled on 09/29/23 at 10:45am

## 2023-08-09 NOTE — Telephone Encounter (Signed)
 Called pt to give info. Pt would like to speak with cma regarding [facility] issues.

## 2023-08-09 NOTE — Telephone Encounter (Signed)
 Copied from CRM [id number]. Topic: Clinical - Medical Advice >> Aug 09, 2023  1:07 PM Corin V wrote: Reason for CRM: Patient was looking at her last AVS and saw a diagnosis for PAD and 3rd stage kidney disease. She does not recall ever having been told about these and would like to know how entered these diagnoses and when they were entered. If they are actual diagnoses please call her to go over any details for the diagnosis and what she can do for care.  Please Advise:  I do see where she has a dx of  Stage 3a chronic kidney disease (HCC) Chronic and I am assuming the PAD is peripheral  artery disease that she is questioning

## 2023-08-10 ENCOUNTER — Telehealth: Payer: Self-pay

## 2023-08-10 NOTE — Telephone Encounter (Signed)
 Copied from CRM [id number]. Topic: Referral - Request for Referral >> Aug 09, 2023  4:48 PM Crispin Dolphin wrote: Did the patient discuss referral with their provider in the last year? Yes (If No - schedule appointment) (If Yes - send message)  Appointment offered? No  Type of order/referral and detailed reason for visit: Mammogram request referral [facility]   Preference of office, provider, [facility]   If referral order, have you been seen by this specialty before? Yes (If Yes, this issue or another issue? When? Where? 2022 same location   Can we respond through MyChart? No  Message sent to provider to address

## 2023-08-11 ENCOUNTER — Other Ambulatory Visit: Payer: Self-pay

## 2023-08-11 DIAGNOSIS — N6019 Diffuse cystic mastopathy of unspecified breast: Secondary | ICD-10-CM

## 2023-08-24 ENCOUNTER — Ambulatory Visit: Payer: Medicare HMO | Admitting: Neurology

## 2023-08-30 ENCOUNTER — Ambulatory Visit: Admitting: Obstetrics and Gynecology

## 2023-09-01 ENCOUNTER — Other Ambulatory Visit: Payer: Self-pay

## 2023-09-01 ENCOUNTER — Telehealth: Payer: Self-pay

## 2023-09-01 DIAGNOSIS — M810 Age-related osteoporosis without current pathological fracture: Secondary | ICD-10-CM

## 2023-09-01 NOTE — Telephone Encounter (Signed)
 Copied from CRM [id number]. Topic: Clinical - Request for Lab/Test Order >> Sep 01, 2023  2:31 PM Magdalene School wrote: Reason for CRM: Patient called stating that she had an appointment scheduled at [facility] for bone density but they stated they will no longer be doing bone density, only mammograms which is why patient needs the order sent somewhere else. She stated they advised her to have it sent to [facility] Address: [address], [place], [place] [address] Phone: [phone]  Dexa has been sent to [facility]

## 2023-09-21 ENCOUNTER — Encounter: Payer: Self-pay | Admitting: Family Medicine

## 2023-09-22 ENCOUNTER — Other Ambulatory Visit: Payer: Self-pay | Admitting: Family Medicine

## 2023-09-22 DIAGNOSIS — N644 Mastodynia: Secondary | ICD-10-CM

## 2023-09-22 DIAGNOSIS — N6019 Diffuse cystic mastopathy of unspecified breast: Secondary | ICD-10-CM

## 2023-09-27 ENCOUNTER — Ambulatory Visit
Admission: RE | Admit: 2023-09-27 | Discharge: 2023-09-27 | Disposition: A | Discharge status: Home / Self Care | Source: Ambulatory Visit | Attending: Family Medicine | Admitting: Family Medicine

## 2023-09-27 DIAGNOSIS — N644 Mastodynia: Secondary | ICD-10-CM

## 2023-09-29 ENCOUNTER — Ambulatory Visit: Admitting: Neurology

## 2023-09-29 VITALS — BP 128/78 | HR 89 | Ht 63.0 in | Wt 133.4 lb

## 2023-09-29 DIAGNOSIS — G20A2 Parkinson's disease without dyskinesia, with fluctuations: Secondary | ICD-10-CM | POA: Diagnosis not present

## 2023-09-29 DIAGNOSIS — Z9181 History of falling: Secondary | ICD-10-CM

## 2023-09-29 DIAGNOSIS — R2689 Other abnormalities of gait and mobility: Secondary | ICD-10-CM

## 2023-09-29 NOTE — Patient Instructions (Signed)
 It was nice to see you again today.  I am sorry you fell yesterday.   If you have worsening right knee pain, I recommend that you get checked out at an urgent care.   I have noticed that your balance and walking have changed and declined.  I would like to have you start using a walker.  I will prescribe a 2 wheeled aluminum frame walker for you.  You have fallen more than once.   I also worry about your driving and I do not believe you are completely safe to drive at this point.  As discussed, this is because of change in your neck posture, limited mobility in your neck, fine motor dyscontrol and slow movements. You can continue with pramipexole  and try to increase your Sinemet  to 1 pill 4 times a day.   You can try low-dose melatonin at night to tone down some of the vivid dreams, take 2 or 3 mg at bedtime.  I would not go higher than 5 mg.   Please avoid taking any Benadryl as it can make you drowsy, and affect your balance adversely and cause mouth dryness.

## 2023-09-29 NOTE — Progress Notes (Signed)
 Subjective:    Patient ID: Julia [name] is a 83 y.o. female.  HPI     Interim history:   Julia [name] is an 83 year old right-handed woman with an underlying complex medical history of breast cancer, colon cancer, hyperlipidemia, restless leg syndrome, scoliosis, arthritis, and Parkinson's disease, who presents for follow-up consultation of her parkinsonism.  The patient is unaccompanied today and presents for her 62-month follow-up.  She was last seen in our clinic by Harlene Bogaert, NP in November 2024, at which time she was advised to increase her Sinemet  to 1 tablet 4 times a day and continue with pramipexole .  She was encouraged to try melatonin at night.   Today, 09/29/2023: She reports having had some worsening gait and balance.  She fell yesterday and skinned her right knee, she did not seek medical attention, fell on carpet.  She does have some residual soreness.  She has not used a walker.  She still drives but admits that she tries to avoid driving when her tremor flares up.  She also fell in March when she was on a trip in [place]  and hit her head, she required stitches at the time.  She has been taking her levodopa  3 times a day, she felt that she had bad dreams when she took it 4 times a day but would be willing to try it.  She never ended up trying melatonin as she had seen something on TV that melatonin was bad for you.  She reports that she takes children's Benadryl once or twice a month for anxiety.  Dr. Jenel had told her to do that.   The patient's allergies, current medications, family history, past medical history, past social history, past surgical history and problem list were reviewed and updated as appropriate.    Previously:   02/23/2023 Young Bogaert, NP): <<Julia [name] is a 83 year old right-handed woman with an underlying complex medical history of breast cancer, colon cancer, hyperlipidemia, restless leg syndrome, scoliosis, arthritis, and Parkinson's disease,  who presents for follow-up consultation of her parkinsonism.  The patient is unaccompanied today and presents for follow-up.  She was previously followed by Dr. Carlin Jenel and transferred care to Dr. Buck with initial visit 06/23/2021.  Her Sinemet  CR 25-100 mg strength 1 pill 4 times daily was increased to 50-200 mg strength 1 pill 3 times daily at the time.  She was maintained on pramipexole  0.25 mg strength at bedtime for restless leg syndrome.       Update 02/23/2023 JM: At prior visit with Dr. Buck, Sinemet  CR dosage increased to 1 tab 4 times daily and continued pramipexole  at 930pm and 1030pm (or later).    Reports she never increased her Sinemet  as she is fearful of depending on medications. She does continue to have good days and bad days, some decline since prior visit. Difficulty with timing of medications. Will have wearing off effects about 30 minutes prior to next dose especially in the evening. Currently, tries to take Sinemet  at 7 AM, 11 AM and 5 PM. Will experience sensation of being very warm and then cold right after taking Sinemet , does not last long. She does c/o hair loss, questions if due to Sinemet . Ambulates without AD, no recent falls. Able to maintain ADLs independently. Use of pramipexole  1 tab at bedtime, will repeat dose if RLS symptoms worsen, usually about 1 hour later. Can wake up in the middle of the night with legs shaking at times. Notes have abnormal dreams  and night terrors, husband mentions she can yell in her sleep and will act out her dreams. Notes worsening anxiety especially with social situations, she reports this is a newer issue and has never had anxiety issues prior but per review of prior OV notes, this has been discussed previously at least over the past 2 years.  Chronic neuropathy symptoms stable. Previously discussed these similar symptoms at prior visits.  Notes gradual decline in vision, more of a blurred vision, seen by ophthalmology and prescribed  new prescription glasses but needs to wait until next year to obtain due to insurance. >>  08/11/2022 (SA): She reports doing about the same, sometimes anxiety triggers her tremor but sometimes anxiety occurs after she takes the levodopa , also when it tends to wear off, she does not have much in the way of other side effects, has been struggling with intermittent diarrhea and takes Imodium as needed.  She does not take a stool softener or laxative currently.  She is scheduled to see GI next month.  She has not fallen.  She does eat dairy, usually half a cup of milk in the morning with her cereal and nearly daily yogurt now.  She was advised to eat yogurt by her nutritionist.  She feels that the tremor flares up in the middle of the night.  She takes pramipexole  around 9:30 PM and a second dose at 10:30 PM.  She takes her levodopa , 50-200 mg strength extended release at 9 AM, 1 PM and 5 PM daily. 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  It is bandaged and she has an appointment coming up in about 2 weeks.  She has had some muscle strain in the back of her right knee, has not fallen, but feels like she may have pulled something.    I saw her on 02/10/2022, at which time she reported feeling fairly stable but did have a fall unfortunately on 01/05/2022.  She was on vacation in [place]  and traveling back they stopped overnight in [place], [place] .  She tripped and landed on the right side of her face, needed stitches by her right eyebrow. She went to a local ER and had a CTH. Her pain management doctor had provided a prescription for Cymbalta  but she never actually took it.  She was afraid of side effects.  She was on Sinemet  CR 1 pill 3 times a day at 8, 12 and 4. She was on  pramipexole  0.25 mg at bedtime which is between 9:30 PM and 10 PM and a second optional dose an hour later.    She was advised to continue with her medication regimen.     I first met  her on 06/23/2021 , as a transfer of care from Dr. Jenel, at which time she reported feeling stable, she was taking Sinemet  long-acting 50-200 mg strength 3 times a day.  She was taking pramipexole  at bedtime.  She had occasional constipation.   She saw Harlene Bogaert, NP in the interim on 10/06/2021, at which time she reported occasional vivid dreams.  She reported chronic pain, she was followed by orthopedics.       06/23/21 (SA): She was previously followed by Dr. Carlin Jenel and was last seen by Dr. Jenel on 11/15/2020.  I reviewed the note and copied the note below for reference. Her Sinemet  CR 25-100 mg strength 1 pill 4 times daily was increased to 50-200 mg strength 1 pill 3 times daily at  the time.  She was maintained on pramipexole  0.25 mg strength at bedtime for restless leg syndrome.     11/15/2020 (Dr. Jenel): <<Julia [name] is a 83 year old right-handed white female with a history of Parkinson's disease and low back pain.  The patient has a lot of arthritic issues, she wakes up in the morning feeling quite stiff but feels much better when she gets up and moves around.  She has restless leg syndrome at night, but she has gained good improvement with Mirapex .  She is on Sinemet  CR taking the 25/100 mg tablets, 1 tablet 4 times daily.  She takes her medications at 7 AM, 11 AM, 3 PM, and 7 PM.  She is still noting some wearing off effect about a half an hour prior to the next dose of her medication.  In the middle the night, she will wake up, and her arm tremors will keep her from getting back to sleep.  She has chronic issues with constipation, she takes milk of magnesia, she has been on FiberCon but still has some issues.  In the past, she could not tolerate magnesium supplementation secondary to diarrhea.  The patient is remaining active, she will walk at least a mile a day.  She has noted some emotional instability that she attributes to her medication.  She does have some gait instability, she  denies any falls but will stumble on occasion.  She denies any issues of significance with swallowing.  She has had some sciatica type pain down the left leg, she is followed through orthopedic surgery and may end up getting an epidural steroid injection in the near future.  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   RLS (restless legs syndrome) 07/14/2018   Scoliosis    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HCC)    Skin cancer 07/2022   removed from R arm    Her Past Surgical History Is Significant For: Past Surgical History:  Procedure Laterality Date   ADENOIDECTOMY     BREAST LUMPECTOMY Left    radiation only   BUNIONECTOMY     COLON SURGERY     HEMORRHOID SURGERY     LAPAROSCOPY     NASAL SINUS SURGERY     OOPHORECTOMY     TONSILLECTOMY     TUBAL LIGATION      Her Family History Is Significant For: Family History  Problem Relation Age of Onset   Arthritis Mother    Diabetes Mother    Heart disease Mother    Hypertension Mother    Arthritis Father    Cancer Father    Depression Father    Osteoporosis Father    Breast cancer Sister    Arthritis Sister    Cancer Sister    Diabetes Sister    Cancer Sister    Cancer Sister    Hyperlipidemia Sister    Cancer Brother    Hyperlipidemia Brother    Parkinson's disease Neg Hx     Her Social History Is Significant For: Social History   Socioeconomic History   Marital status: Married    Spouse name: Not on file  Number of children: 3   Years of education: masters + 45 credits   Highest education level: Master's degree (e.g., MA, MS, MEng, MEd, MSW, MBA)  Occupational History   Occupation: Retired  Runner, broadcasting/film/video   Tobacco Use   Smoking status: Never    Passive exposure: Never   Smokeless tobacco: Never  Vaping Use   Vaping status: Never Used  Substance and Sexual Activity   Alcohol use: Yes    Comment: a beer once a month with pizza   Drug use: Never   Sexual activity: Not Currently  Other Topics Concern   Not on file  Social History Narrative   Right handed    Lives with husband    Caffeine 2 cups daily    Retired Wellsite geologist   Social Drivers of Health   Financial Resource Strain: Low Risk  (03/15/2023)   Overall Financial Resource Strain (CARDIA)    Difficulty of Paying Living Expenses: Not hard at all  Food Insecurity: No Food Insecurity (03/15/2023)   Hunger Vital Sign    Worried About Running Out of Food in the Last Year: Never true    Ran Out of Food in the Last Year: Never true  Transportation Needs: No Transportation Needs (03/15/2023)   PRAPARE - Administrator, Civil Service (Medical): No    Lack of Transportation (Non-Medical): No  Physical Activity: Sufficiently Active (03/15/2023)   Exercise Vital Sign    Days of Exercise per Week: 7 days    Minutes of Exercise per Session: 60 min  Stress: No Stress Concern Present (03/15/2023)   [employer] of Occupational Health - Occupational Stress Questionnaire    Feeling of Stress : Only a little  Social Connections: Moderately Integrated (03/15/2023)   Social Connection and Isolation Panel    Frequency of Communication with Friends and Family: More than three times a week    Frequency of Social Gatherings with Friends and Family: More than three times a week    Attends Religious Services: More than 4 times per year    Active Member of [employer] or Organizations: No    Attends Banker Meetings: Never    Marital Status: Married    Her Allergies Are:  Allergies  Allergen Reactions   Cefdinir    Cholestyramine     GERD   Contrast Media [Iodinated Contrast Media] Diarrhea   Ezetimibe     Meperidine    Nitrofurantoin  Diarrhea   Septra  [Sulfamethoxazole -Trimethoprim ] Diarrhea   Statins    Teriparatide   :   Her Current Medications Are:  Outpatient Encounter Medications as of 09/29/2023  Medication Sig   Acetaminophen  (TYLENOL  PO) Take by mouth as needed.   Calcium Citrate-Vitamin D  315-250 MG-UNIT TABS Take by mouth.   carbidopa -levodopa  (SINEMET  CR) 50-200 MG tablet Take 1 tablet 4 times daily - start at 7am, then repeat at 11am, 3pm and 7pm   carboxymethylcellulose (REFRESH PLUS) 0.5 % SOLN 1 drop 3 (three) times daily as needed.   denosumab  (PROLIA ) 60 MG/ML SOSY injection Inject 60 mg into the skin every 6 (six) months.   diphenhydrAMINE (BENADRYL) 12.5 MG chewable tablet Chew 12.5 mg by mouth every 6 (six) hours as needed.   estradiol  (ESTRACE ) 0.1 MG/GM vaginal cream Place 0.5g twice a week at night   levothyroxine  (SYNTHROID ) 88 MCG tablet TAKE ONE TABLET BY MOUTH DAILY FOR BREAKFAST   Multiple Vitamins-Minerals (CENTRUM SILVER PO) Take by mouth.   Polyvinyl Alcohol-Povidone (REFRESH OP) Apply to eye.  pramipexole  (MIRAPEX ) 0.25 MG tablet TAKE 1 TABLET BY MOUTH 2 HOURS BEFORE BEDTIME AND 1 TABLET AT BEDTIME.   Probiotic Product (PROBIOTIC DAILY PO) Take by mouth.   psyllium (HYDROCIL/METAMUCIL) 95 % PACK Take 1 packet by mouth daily.   SALINE NASAL MIST NA Place into the nose.   Facility-Administered Encounter Medications as of 09/29/2023  Medication   denosumab  (PROLIA ) injection 60 mg  :  Review of Systems:  Out of a complete 14 point review of systems, all are reviewed and negative with the exception of these symptoms as listed below: Sorry about spelling, not sure  Review of Systems  Neurological:        Pt taking her CL TID (10-07-1628).  Taking qid made her feel bad.  Her balance off, has issues with falls *yesterday R knee/weakness and end march. Trips easily.  No therapy.  Taking naps and moving helps. Dry mouth is complaint.     Objective:   Neurological Exam  Physical Exam Physical Examination:   Vitals:   09/29/23 1035  BP: 128/78  Pulse: 89  SpO2: 98%    General Examination: The patient is a very pleasant 83 y.o. female in no acute distress. She appears frail.  Well-groomed.    HEENT: Normocephalic, atraumatic, corrective eyeglasses in place.  Pupils are reactive to light, tracking is mildly impaired, speech with mild to moderate hypophonia, no obvious dysarthria but speech is scant.  Face is symmetric with moderate facial masking and decrease in eye blink rate.  Moderate nuchal rigidity and increased forward tilt in the neck.  She has limitation in neck mobility.  Airway examination reveals mild to moderate mouth dryness, small mouth opening, slight nose deformity, stable.     Chest: Clear to auscultation without wheezing, rhonchi or crackles noted.   Heart: S1+S2+0, regular and normal without murmurs, rubs or gallops noted.    Abdomen: Soft, non-tender and non-distended.   Extremities: There is no pitting edema in the distal lower extremities bilaterally.    Skin: Warm and dry without trophic changes noted.    Musculoskeletal: exam reveals scoliosis and increase in upper back curvature, left shoulder higher than right, seems stable.  Reports discomfort in her right knee.    Neurologically:  Mental status: The patient is awake, alert and oriented in all 4 spheres. Her immediate and remote memory, attention, language skills and fund of knowledge are appropriate. There is no evidence of aphasia, agnosia, apraxia or anomia. Mood is normal and affect is normal.  Cranial nerves II - XII are as described above under HEENT exam.  Motor exam: Thin bulk, global strength of 4+ out of 5, increase in tone in all 4 extremities, no obvious dyskinesias.  Romberg is not tested for safety reasons.  Fine motor and coordination: She has moderate difficulty with the right and left upper extremity with finger taps and hand movements but  more pronounced on the left.  She has moderate difficulty with foot taps bilaterally, more noticeable on the left.   She stands with mild difficulty and pushes herself up.  Posture is moderately stooped with forward tilt of her neck.  She walks without a walking aid, she walks slowly and cautiously with decreased armswing bilaterally.   She does have some discomfort in her right knee.  Cerebellar testing: No dysmetria or intention tremor. There is no truncal or gait ataxia.  Sensory exam: intact to light touch in the upper and lower extremities.    Assessment and Plan:  In  summary, Julia [name] is a very pleasant 83 year old female with an underlying complex medical history of breast cancer, colon cancer, SCC, hyperlipidemia, restless leg syndrome, scoliosis, arthritis, and Parkinson's disease, who presents for follow-up consultation of her parkinsonism with left-sided predominance, complicated by balance issues and falls.  She has been on levodopa  long-acting 50-200 mg strength 1 pill 3 times a day.  She is interested in increasing it.  She is advised to increase it but try to take the last dose around 7 PM because she had experienced vivid dreams.  She is encouraged to try melatonin in low-dose, 2 to 3 mg at bedtime, no more than 5 mg.  Below is a summary of my recommendations and our discussion points based on today's visit.  She was given these instructions verbally during the visit and also in her after visit summary electronically which she can access.  She is advised to follow-up routinely in 6 months in this clinic to see the nurse practitioner.  I answered all her questions today and she was in agreement.  << If you have worsening right knee pain, I recommend that you get checked out at an urgent care.   I have noticed that your balance and walking have changed and declined.  I would like to have you start using a walker.  I will prescribe a 2 wheeled aluminum frame walker for you.  You have  fallen more than once.   I also worry about your driving and I do not believe you are completely safe to drive at this point.  As discussed, this is because of change in your neck posture, limited mobility in your neck, fine motor dyscontrol and slow movements. You can continue with pramipexole  and try to increase your Sinemet  to 1 pill 4 times a day.   You can try low-dose melatonin at night to tone down some of the vivid dreams, take 2 or 3 mg at bedtime.  I would not go higher than 5 mg.   Please avoid taking any Benadryl as it can make you drowsy, and affect your balance adversely and cause mouth dryness.>>  I spent 40 minutes in total face-to-face time and in reviewing records during pre-charting, more than 50% of which was spent in counseling and coordination of care, reviewing test results, reviewing medications and treatment regimen and/or in discussing or reviewing the diagnosis of PD, the prognosis and treatment options. Pertinent laboratory and imaging test results that were available during this visit with the patient were reviewed by me and considered in my medical decision making (see chart for details).

## 2023-10-01 ENCOUNTER — Other Ambulatory Visit: Payer: Self-pay | Admitting: Neurology

## 2023-10-02 DIAGNOSIS — M25561 Pain in right knee: Secondary | ICD-10-CM | POA: Insufficient documentation

## 2023-10-04 ENCOUNTER — Other Ambulatory Visit: Payer: Self-pay | Admitting: Medical

## 2023-10-04 DIAGNOSIS — M25561 Pain in right knee: Secondary | ICD-10-CM

## 2023-10-05 ENCOUNTER — Inpatient Hospital Stay
Admission: RE | Admit: 2023-10-05 | Discharge: 2023-10-05 | Discharge status: Home / Self Care | Source: Ambulatory Visit | Attending: Medical

## 2023-10-05 DIAGNOSIS — M25561 Pain in right knee: Secondary | ICD-10-CM

## 2023-10-06 ENCOUNTER — Other Ambulatory Visit

## 2023-10-06 ENCOUNTER — Ambulatory Visit: Payer: Medicare HMO

## 2023-10-06 ENCOUNTER — Other Ambulatory Visit: Payer: Self-pay | Admitting: *Deleted

## 2023-10-06 DIAGNOSIS — M810 Age-related osteoporosis without current pathological fracture: Secondary | ICD-10-CM | POA: Diagnosis not present

## 2023-10-06 MED ORDER — DENOSUMAB 60 MG/ML ~~LOC~~ SOSY
60.0000 mg | PREFILLED_SYRINGE | Freq: Once | SUBCUTANEOUS | Status: AC
  Administered 2023-10-06: 60 mg via SUBCUTANEOUS

## 2023-10-06 NOTE — Progress Notes (Signed)
 Pt came in on the Nurse schedule to receive her Prolia  injection per Dr. Jodie. Administered in the Rt arm without any complaints.

## 2023-11-05 DIAGNOSIS — H903 Sensorineural hearing loss, bilateral: Secondary | ICD-10-CM | POA: Insufficient documentation

## 2023-11-09 ENCOUNTER — Ambulatory Visit: Payer: Medicare HMO | Admitting: Family Medicine

## 2023-11-09 ENCOUNTER — Encounter: Payer: Self-pay | Admitting: Family Medicine

## 2023-11-09 VITALS — BP 161/92 | HR 90 | Temp 97.7°F | Ht 63.0 in | Wt 134.2 lb

## 2023-11-09 DIAGNOSIS — M810 Age-related osteoporosis without current pathological fracture: Secondary | ICD-10-CM

## 2023-11-09 DIAGNOSIS — R7301 Impaired fasting glucose: Secondary | ICD-10-CM | POA: Diagnosis not present

## 2023-11-09 DIAGNOSIS — L570 Actinic keratosis: Secondary | ICD-10-CM | POA: Diagnosis not present

## 2023-11-09 DIAGNOSIS — G20A2 Parkinson's disease without dyskinesia, with fluctuations: Secondary | ICD-10-CM | POA: Diagnosis not present

## 2023-11-09 DIAGNOSIS — F419 Anxiety disorder, unspecified: Secondary | ICD-10-CM

## 2023-11-09 DIAGNOSIS — R030 Elevated blood-pressure reading, without diagnosis of hypertension: Secondary | ICD-10-CM

## 2023-11-09 LAB — POCT GLYCOSYLATED HEMOGLOBIN (HGB A1C): Hemoglobin A1C: 5.8 % — AB (ref 4.0–5.6)

## 2023-11-09 NOTE — Progress Notes (Signed)
 Subjective  CC:  Chief Complaint  Patient presents with   Parkinson's Disease    HPI: Julia [name] is a 83 y.o. female who presents to the office today to address the problems listed above in the chief complaint. Discussed the use of AI scribe software for clinical note transcription with the patient, who gave verbal consent to proceed.  History of Present Illness Julia [name] is an 83 year old female with Parkinson's disease who presents for a follow-up visit. Her husband, Deward, is present and waiting in the parking lot.  Her A1c has improved to 5.8, attributed to dietary changes including increased consumption of fruits and vegetables. She acknowledges the higher cost of these groceries but considers it a worthwhile investment for her health. Highest A1c was 6.5.   She has noticed a small pimple-like lesion on her back for the past couple of weeks, which she cannot see herself.  She discusses her husband's difficulty adjusting to her current location, which affects her emotionally. He is experiencing some depression and has difficulty hearing her, which she attributes to her softer voice due to Parkinson's disease.  She recently purchased a new car, a [employer], but expresses frustration as her neurologist recommended she not drive due to her Parkinson's. She feels her mobility and stability are good, as she walks daily with a friend and is aware of her body's responses to medication. She describes feeling more focused and less anxious when driving, despite occasional shakiness and anxiety.  Her blood pressure was high during today's visit, which she attributes to anxiety. She has not been diagnosed with hypertension but has had borderline readings in the past. She does not have a blood pressure cuff at home but visits the doctor frequently enough to monitor it.  She experiences vivid dreams that she believes are side effects of her medications, which sometimes leave her feeling scared  and anxious. These dreams are very realistic and often negative.   Assessment  1. Parkinson's disease with fluctuating manifestations, unspecified whether dyskinesia present (HCC)   2. Impaired fasting glucose   3. Anxiety   4. Age-related osteoporosis without current pathological fracture   5. Elevated blood pressure reading without diagnosis of hypertension      Plan  Assessment and Plan Assessment & Plan Parkinson's disease Concerns about driving ability despite neurologist's recommendation not to drive. She feels stable in mobility and cognitive function. - Review neurologist's note for driving recommendation rationale. - Encouraged safe driving practices and self-assessment.  Type 2 diabetes mellitus without complications Well-controlled with A1c of 5.8, improved from previous readings. - Continue dietary habits with emphasis on fruits and vegetables.  Actinic keratosis (frozen lesion) Lesion on back treated with cryotherapy. - Monitor lesion for resolution. - Consider repeat cryotherapy if unresolved.  Elevated blood pressure, without diagnosis of hypertension Elevated blood pressure possibly due to anxiety, no hypertension diagnosis. - Monitor blood pressure at future appointments. - Consider antihypertensive medication if persistent elevation.    Follow up: 6 mo for recheck. Will f/u bp at 3 months at neurology visit.  Orders Placed This Encounter  Procedures   POCT HgB A1C   No orders of the defined types were placed in this encounter.    I reviewed the patients updated PMH, FH, and SocHx.  Patient Active Problem List   Diagnosis Date Noted   Stage 3a chronic kidney disease (HCC) 05/12/2023    Priority: High   Statin intolerance 10/13/2018    Priority: High  Parkinson's disease (HCC) 09/29/2017    Priority: High   Age-related osteoporosis without current pathological fracture 07/20/2016    Priority: High   Mixed hyperlipidemia 03/07/2015    Priority:  High   Impaired fasting glucose 03/07/2015    Priority: High   History of colon cancer 02/13/2015    Priority: High   Hypothyroidism due to Hashimoto's thyroiditis 02/13/2015    Priority: High   History of breast cancer left, 2016 02/13/2015    Priority: High   Multiple pulmonary nodules 06/20/2019    Priority: Medium    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    Priority: Medium    Anxiety 09/29/2017    Priority: Medium    Dysfunctional voiding of urine 09/13/2016    Priority: Medium    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    Priority: Medium    Gastroesophageal reflux disease 09/13/2016    Priority: Medium    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    Priority: Medium    Irritable bowel syndrome with both constipation and diarrhea 02/13/2015    Priority: Medium    Pelvic floor dysfunction 08/26/2018    Priority: Low   Female bladder prolapse 04/07/2018    Priority: Low   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   Calcium Citrate-Vitamin D  315-250 MG-UNIT TABS Take by mouth.   carbidopa -levodopa  (SINEMET  CR) 50-200 MG tablet Take 1 tablet 4 times daily - start at 7am, then repeat at 11am, 3pm and 7pm   denosumab  (PROLIA ) 60 MG/ML SOSY injection Inject 60 mg into the skin every 6 (six) months.   diphenhydrAMINE (BENADRYL) 12.5 MG chewable tablet Chew 12.5 mg by mouth every 6 (six) hours as needed.   estradiol  (ESTRACE ) 0.1 MG/GM vaginal cream Place 0.5g twice a week at night   levothyroxine   (SYNTHROID ) 88 MCG tablet TAKE ONE TABLET BY MOUTH DAILY FOR BREAKFAST   Multiple Vitamins-Minerals (CENTRUM SILVER PO) Take by mouth.   pramipexole  (MIRAPEX ) 0.25 MG tablet TAKE 1 TABLET BY MOUTH 2 HOURS BEFORE BEDTIME AND 1 TABLET AT BEDTIME.   Probiotic Product (PROBIOTIC DAILY PO) Take by mouth.   psyllium (HYDROCIL/METAMUCIL) 95 % PACK Take 1 packet by mouth daily.   SALINE NASAL MIST NA Place into the nose.   Current Facility-Administered Medications for the 11/09/23 encounter (Office Visit) with Jodie Lavern CROME, MD  Medication   denosumab  (PROLIA ) injection 60 mg   Allergies: Patient is allergic to cefdinir, cholestyramine, contrast media [iodinated contrast media], ezetimibe, meperidine, nitrofurantoin , septra  [sulfamethoxazole -trimethoprim ], statins, and teriparatide. Family History: Patient family history includes Arthritis in her father, mother, and sister; Breast cancer in her sister; Cancer in her brother, father, sister, sister, and sister; Depression in her father; Diabetes in her mother and sister; Heart disease in her mother; Hyperlipidemia in her brother and sister; Hypertension in her mother; Osteoporosis in her father. Social History:  Patient  reports that she has never smoked. She has never been exposed to tobacco  smoke. She has never used smokeless tobacco. She reports current alcohol use. She reports that she does not use drugs.  Review of Systems: Constitutional: Negative for fever malaise or anorexia Cardiovascular: negative for chest pain Respiratory: negative for SOB or persistent cough Gastrointestinal: negative for abdominal pain  Objective  Vitals: BP (!) 161/92   Pulse 90   Temp 97.7 F (36.5 C)   Ht 5' 3 (1.6 m)   Wt 134 lb 3.2 oz (60.9 kg)   SpO2 98%   BMI 23.77 kg/m  General: no acute distress , A&Ox3, posture: head stooped forward HEENT: PEERL, conjunctiva normal, neck is supple Cardiovascular:  RRR without murmur or gallop.  Respiratory:  Good  breath sounds bilaterally, CTAB with normal respiratory effort Skin:  Warm, no rashes Gait: pushes self up but walking is more stable today, decreased arm swing  Cryotherapy Procedure Note  Pre-operative Diagnosis: Actinic keratosis  Post-operative Diagnosis: Actinic keratosis  Locations: right posterior shoulder  Indications: precancerous  Anesthesia: none  Procedure Details   Patient informed of risks (permanent scarring, infection, light or dark discoloration, bleeding, infection, weakness, numbness and recurrence of the lesion) and benefits of the procedure and verbal informed consent obtained. Universal time out performed  The areas are treated with liquid nitrogen therapy, frozen until ice ball extended 2 mm beyond lesion, allowed to thaw, and treated again. The patient tolerated procedure well.  The patient was instructed on post-op care, warned that there may be blister formation, redness and pain. Recommend OTC analgesia as needed for pain.  Condition: Stable  Complications: none.  Commons side effects, risks, benefits, and alternatives for medications and treatment plan prescribed today were discussed, and the patient expressed understanding of the given instructions. Patient is instructed to call or message via MyChart if he/she has any questions or concerns regarding our treatment plan. No barriers to understanding were identified. We discussed Red Flag symptoms and signs in detail. Patient expressed understanding regarding what to do in case of urgent or emergency type symptoms.  Medication list was reconciled, printed and provided to the patient in AVS. Patient instructions and summary information was reviewed with the patient as documented in the AVS. This note was prepared with assistance of Dragon voice recognition software. Occasional wrong-word or sound-a-like substitutions may have occurred due to the inherent limitations of voice recognition software

## 2023-11-09 NOTE — Patient Instructions (Signed)
 Please return in 6 months for your annual complete physical; please come fasting.    Will check your blood pressure in 3 months at your neurologic follow-up visit.  Please send me a message when you have this visit so I can review your blood pressure.  If you have any questions or concerns, please don't hesitate to send me a message via MyChart or call the office at [phone]. Thank you for visiting with us  today! It's our pleasure caring for you.

## 2023-12-13 ENCOUNTER — Other Ambulatory Visit: Payer: Self-pay | Admitting: Family Medicine

## 2023-12-15 ENCOUNTER — Ambulatory Visit: Payer: Medicare HMO | Admitting: Dermatology

## 2023-12-16 ENCOUNTER — Encounter: Payer: Self-pay | Admitting: Dermatology

## 2023-12-16 ENCOUNTER — Ambulatory Visit: Payer: Medicare HMO | Admitting: Dermatology

## 2023-12-16 VITALS — BP 116/76 | HR 83

## 2023-12-16 DIAGNOSIS — W908XXA Exposure to other nonionizing radiation, initial encounter: Secondary | ICD-10-CM

## 2023-12-16 DIAGNOSIS — L578 Other skin changes due to chronic exposure to nonionizing radiation: Secondary | ICD-10-CM | POA: Diagnosis not present

## 2023-12-16 DIAGNOSIS — Z1283 Encounter for screening for malignant neoplasm of skin: Secondary | ICD-10-CM | POA: Diagnosis not present

## 2023-12-16 DIAGNOSIS — L719 Rosacea, unspecified: Secondary | ICD-10-CM | POA: Insufficient documentation

## 2023-12-16 DIAGNOSIS — L570 Actinic keratosis: Secondary | ICD-10-CM | POA: Diagnosis not present

## 2023-12-16 DIAGNOSIS — C4492 Squamous cell carcinoma of skin, unspecified: Secondary | ICD-10-CM | POA: Insufficient documentation

## 2023-12-16 DIAGNOSIS — L821 Other seborrheic keratosis: Secondary | ICD-10-CM

## 2023-12-16 DIAGNOSIS — L814 Other melanin hyperpigmentation: Secondary | ICD-10-CM | POA: Diagnosis not present

## 2023-12-16 DIAGNOSIS — R21 Rash and other nonspecific skin eruption: Secondary | ICD-10-CM

## 2023-12-16 DIAGNOSIS — D1801 Hemangioma of skin and subcutaneous tissue: Secondary | ICD-10-CM | POA: Diagnosis not present

## 2023-12-16 DIAGNOSIS — L661 Lichen planopilaris, unspecified: Secondary | ICD-10-CM | POA: Insufficient documentation

## 2023-12-16 DIAGNOSIS — Z85828 Personal history of other malignant neoplasm of skin: Secondary | ICD-10-CM

## 2023-12-16 DIAGNOSIS — D229 Melanocytic nevi, unspecified: Secondary | ICD-10-CM

## 2023-12-16 MED ORDER — CLOBETASOL PROPIONATE 0.05 % EX CREA: 1.0000 | TOPICAL_CREAM | Freq: Two times a day (BID) | CUTANEOUS | 3 refills | Status: DC

## 2023-12-16 NOTE — Patient Instructions (Addendum)

## 2023-12-16 NOTE — Progress Notes (Signed)
 Total Body Skin Exam (TBSE) Visit   Subjective  Julia [name] is a 83 y.o. female who presents for the following: Skin Cancer Screening and Full Body Skin Exam  Patient presents today for follow up visit for TBSE. Patient was last evaluated on 12/15/23. Patient denies medication changes. Patient reports she does have spots, moles and lesions of concern to be evaluated. Patient reports throughout his lifetime she has had moderate sun exposure. Currently, patient reports if she has excessive sun exposure, she does apply sunscreen and/or wears protective coverings. Patient reports she has hx of bx (SCC- right wrist). Patient denies  family history of skin cancers. The patient has spots, moles and lesions to be evaluated, some may be new or changing and the patient has concerns that these could be cancer.  The following portions of the chart were reviewed this encounter and updated as appropriate: medications, allergies, medical history  Review of Systems:  No other skin or systemic complaints except as noted in HPI or Assessment and Plan.  Objective  Well appearing patient in no apparent distress; mood and affect are within normal limits.  A full examination was performed including scalp, head, eyes, ears, nose, lips, neck, chest, axillae, abdomen, back, buttocks, bilateral upper extremities, bilateral lower extremities, hands, feet, fingers, toes, fingernails, and toenails. All findings within normal limits unless otherwise noted below.   Relevant physical exam findings are noted in the Assessment and Plan.  Chest - Medial [facility]), Left Breast, Right Breast Erythematous thin papules/macules with gritty scale.   Assessment & Plan   LENTIGINES, SEBORRHEIC KERATOSES, HEMANGIOMAS - Benign normal skin lesions - Benign-appearing - Call for any changes  MELANOCYTIC NEVI - Tan-brown and/or pink-flesh-colored symmetric macules and papules - Benign appearing on exam today - Observation - Call  clinic for new or changing moles - Recommend daily use of broad spectrum spf 30+ sunscreen to sun-exposed areas.   ACTINIC DAMAGE - Chronic condition, secondary to cumulative UV/sun exposure - diffuse scaly erythematous macules with underlying dyspigmentation - Recommend daily broad spectrum sunscreen SPF 30+ to sun-exposed areas, reapply every 2 hours as needed.  - Staying in the shade or wearing long sleeves, sun glasses (UVA+UVB protection) and wide brim hats (4-inch brim around the entire circumference of the hat) are also recommended for sun protection.  - Call for new or changing lesions.  HISTORY OF SQUAMOUS CELL CARCINOMA OF THE SKIN - No evidence of recurrence today - No lymphadenopathy - Recommend regular full body skin exams - Recommend daily broad spectrum sunscreen SPF 30+ to sun-exposed areas, reapply every 2 hours as needed.  - Call if any new or changing lesions are noted between office visits  ACTINIC KERATOSIS Exam: Erythematous thin papules/macules with gritty scale at the Chest  Actinic keratoses are precancerous spots that appear secondary to cumulative UV radiation exposure/sun exposure over time. They are chronic with expected duration over 1 year. A portion of actinic keratoses will progress to squamous cell carcinoma of the skin. It is not possible to reliably predict which spots will progress to skin cancer and so treatment is recommended to prevent development of skin cancer.  Recommend daily broad spectrum sunscreen SPF 30+ to sun-exposed areas, reapply every 2 hours as needed.  Recommend staying in the shade or wearing long sleeves, sun glasses (UVA+UVB protection) and wide brim hats (4-inch brim around the entire circumference of the hat). Call for new or changing lesions.  Treatment Plan: - Cryo Therapy Completed while in office today  SKIN CANCER SCREENING PERFORMED TODAY.  AK (ACTINIC KERATOSIS) (3) Chest - Medial (Center), Left Breast, Right  Breast Destruction of lesion - Chest - Medial (Center), Left Breast, Right Breast Complexity: simple   Destruction method: cryotherapy   Informed consent: discussed and consent obtained   Timeout:  patient name, date of birth, surgical site, and procedure verified Lesion destroyed using liquid nitrogen: Yes   Cryotherapy cycles:  3 Post-procedure details: wound care instructions given     Return in about 1 year (around 12/15/2024) for TBSE.  I, Darshay Deupree, am acting as Neurosurgeon for [employer], DO.  Documentation: I have reviewed the above documentation for accuracy and completeness, and I agree with the above.  Delon Lenis, DO

## 2024-01-21 ENCOUNTER — Other Ambulatory Visit

## 2024-01-21 ENCOUNTER — Encounter: Payer: Self-pay | Admitting: Family

## 2024-01-21 ENCOUNTER — Ambulatory Visit: Payer: Self-pay

## 2024-01-21 ENCOUNTER — Ambulatory Visit: Admitting: Family

## 2024-01-21 VITALS — BP 134/70 | HR 93 | Temp 96.8°F | Ht 63.0 in | Wt 135.4 lb

## 2024-01-21 DIAGNOSIS — R319 Hematuria, unspecified: Secondary | ICD-10-CM

## 2024-01-21 DIAGNOSIS — R300 Dysuria: Secondary | ICD-10-CM

## 2024-01-21 MED ORDER — CEPHALEXIN 500 MG PO CAPS: 500.0000 mg | ORAL_CAPSULE | Freq: Three times a day (TID) | ORAL | 0 refills | Status: AC

## 2024-01-21 NOTE — Telephone Encounter (Signed)
 Patient with hx of UTIs called with concerns for burning with urination. Patient states similar symptom with other UTIs. Patient is wanting to avoid the ED and would like to be seen today. Scheduled for this afternoon at 1:30 PM today in office.  FYI Only or Action Required?: FYI only for provider.  Patient was last seen in primary care on 11/09/2023 by Jodie Lavern CROME, MD.  Called Nurse Triage reporting Dysuria.  Symptoms began today.  Interventions attempted: Rest, hydration, or home remedies.  Symptoms are: unchanged.  Triage Disposition: See Physician Within 24 Hours  Patient/caregiver understands and will follow disposition?: Yes  Copied from CRM [id number]. Topic: Clinical - Red Word Triage >> Jan 21, 2024 11:02 AM Ahlexyia S wrote: Red Word that prompted transfer to Nurse Triage: Pt stated that she is experiencing burning when going to urinate. When asked if pt is having any other symptoms she stated it is kind of hard for her to explain. Warm transferred to nurse triage. Reason for Disposition  All other patients with painful urination  (Exception: [1] EITHER frequency or urgency AND [2] has on-call doctor.)  Answer Assessment - Initial Assessment Questions 1. SEVERITY: How bad is the pain?  (e.g., Scale 1-10; mild, moderate, or severe)     moderate 2. FREQUENCY: How many times have you had painful urination today?      Couple of times 3. PATTERN: Is pain present every time you urinate or just sometimes?      Sometimes 4. ONSET: When did the painful urination start?      Started this morning 5. FEVER: Do you have a fever? If Yes, ask: What is your temperature, how was it measured, and when did it start?     no 6. PAST UTI: Have you had a urine infection before? If Yes, ask: When was the last time? and What happened that time?      yes 7. CAUSE: What do you think is causing the painful urination?  (e.g., UTI, scratch, Herpes sore)     UTI 8. OTHER  SYMPTOMS: Do you have any other symptoms? (e.g., blood in urine, flank pain, genital sores, urgency, vaginal discharge)     no  Protocols used: Urination Pain - Female-A-AH

## 2024-01-21 NOTE — Progress Notes (Signed)
 Patient ID: Julia [name], female    DOB: [birth year], 83 y.o.   MRN: [id number]  Chief Complaint  Patient presents with   Dysuria    Pt c/o dysuria and urinary frequency, present since this morning.   Hypertension  Discussed the use of AI scribe software for clinical note transcription with the patient, who gave verbal consent to proceed.  History of Present Illness BRIANNAH [name] is an 83 year old female with recurrent urinary tract infections who presents with burning urination.  She experiences burning during urination, consistent with her history of recurrent UTIs. Despite daily cranberry supplements, her symptoms have returned. In February, she was effectively treated with Keflex . She recalls a previous culture did not show infection, but another did. She read in her chart about stage 3 kidney disease and avoids medications like Aleve and ibuprofen to protect her kidney function, using Tylenol  instead. She had a concussion from a fall in March, evaluated in the emergency room, without loss of consciousness. Her neurologist advised against driving, affecting her independence.  Assessment & Plan Urinary tract infection Recurrent UTI with dysuria, mild infection likely. Previous cultures showed trace infection. Stage 3 kidney disease requires careful antibiotic selection. Keflex  chosen for mild infection. - Prescribed Keflex  500 mg orally TID for 5 days. - Advised discontinuation of Azo during antibiotics. - Encouraged increased fluid intake. - Instructed to report worsening symptoms. - Advised taking antibiotics with food. - Discussed option to discontinue antibiotics after 3 days if symptoms resolve.  Subjective:    Outpatient Medications Prior to Visit  Medication Sig Dispense Refill   Acetaminophen  (TYLENOL  PO) Take by mouth as needed.     Calcium Citrate-Vitamin D  315-250 MG-UNIT TABS Take by mouth.     carbidopa -levodopa  (SINEMET  CR) 50-200 MG tablet Take 1 tablet 4 times  daily - start at 7am, then repeat at 11am, 3pm and 7pm 360 tablet 2   clobetasol  cream (TEMOVATE ) 0.05 % Apply 1 Application topically 2 (two) times daily. Apply 2 times a day for 2 weeks then STOP resume after 2 weeks. Apply for 2 weeks then take a break for 2 weeks 60 g 3   denosumab  (PROLIA ) 60 MG/ML SOSY injection Inject 60 mg into the skin every 6 (six) months. 180 mL 0   diphenhydrAMINE (BENADRYL) 12.5 MG chewable tablet Chew 12.5 mg by mouth every 6 (six) hours as needed.     estradiol  (ESTRACE ) 0.1 MG/GM vaginal cream Place 0.5g twice a week at night 30 g 11   levothyroxine  (SYNTHROID ) 88 MCG tablet TAKE ONE TABLET BY MOUTH DAILY FOR BREAKFAST 90 tablet 3   Multiple Vitamins-Minerals (CENTRUM SILVER PO) Take by mouth.     Povidone, PF, (IVIZIA DRY EYES) 0.5 % SOLN Apply to eye.     pramipexole  (MIRAPEX ) 0.25 MG tablet TAKE 1 TABLET BY MOUTH 2 HOURS BEFORE BEDTIME AND 1 TABLET AT BEDTIME. 180 tablet 1   Probiotic Product (PROBIOTIC DAILY PO) Take by mouth.     psyllium (HYDROCIL/METAMUCIL) 95 % PACK Take 1 packet by mouth daily.     SALINE NASAL MIST NA Place into the nose.     Facility-Administered Medications Prior to Visit  Medication Dose Route Frequency Provider Last Rate Last Admin   denosumab  (PROLIA ) injection 60 mg  60 mg Subcutaneous Q6 months Jodie Lavern CROME, MD   60 mg at 04/01/23 1523   Past Medical History:  Diagnosis Date   Arthritis    Breast cancer (HCC)  Colon cancer (HCC)    previous colon cancer   Diabetes mellitus without complication (HCC)    Hyperlipidemia    Nocturnal leg cramps 11/13/2019   Parkinson disease (HCC)    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   RLS (restless legs syndrome) 07/14/2018   Scoliosis    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HCC)    Skin cancer 07/2022   removed from R arm   Past Surgical History:  Procedure Laterality Date   ADENOIDECTOMY     BREAST LUMPECTOMY Left    radiation only   BUNIONECTOMY      COLON SURGERY     HEMORRHOID SURGERY     LAPAROSCOPY     NASAL SINUS SURGERY     OOPHORECTOMY     TONSILLECTOMY     TUBAL LIGATION     Allergies  Allergen Reactions   Cefdinir    Cholestyramine     GERD   Contrast Media [Iodinated Contrast Media] Diarrhea   Ezetimibe    Meperidine    Nitrofurantoin  Diarrhea   Septra  [Sulfamethoxazole -Trimethoprim ] Diarrhea   Statins    Teriparatide       Objective:    Physical Exam Vitals and nursing note reviewed.  Constitutional:      Appearance: Normal appearance.  Cardiovascular:     Rate and Rhythm: Normal rate and regular rhythm.  Pulmonary:     Effort: Pulmonary effort is normal.     Breath sounds: Normal breath sounds.  Musculoskeletal:        General: Normal range of motion.  Skin:    General: Skin is warm and dry.  Neurological:     Mental Status: She is alert.  Psychiatric:        Mood and Affect: Mood normal.        Behavior: Behavior normal.    BP 134/70 (BP Location: Left Arm, Patient Position: Sitting, Cuff Size: Normal)   Pulse 93   Temp (!) 96.8 F (36 C) (Temporal)   Ht 5' 3 (1.6 m)   Wt 135 lb 6 oz (61.4 kg)   SpO2 98%   BMI 23.98 kg/m  Wt Readings from Last 3 Encounters:  01/21/24 135 lb 6 oz (61.4 kg)  11/09/23 134 lb 3.2 oz (60.9 kg)  09/29/23 133 lb 6.4 oz (60.5 kg)       Lucius Krabbe, NP

## 2024-01-21 NOTE — Telephone Encounter (Signed)
 Noted

## 2024-01-24 ENCOUNTER — Ambulatory Visit: Payer: Self-pay | Admitting: Family

## 2024-01-24 LAB — POCT URINALYSIS DIPSTICK
Bilirubin, UA: NEGATIVE
Blood, UA: POSITIVE — AB
Glucose, UA: NEGATIVE
Ketones, UA: NEGATIVE
Nitrite, UA: NEGATIVE
Protein, UA: POSITIVE — AB
Spec Grav, UA: 1.025 (ref 1.010–1.025)
Urobilinogen, UA: 0.2 U/dL
pH, UA: 6 (ref 5.0–8.0)

## 2024-01-25 ENCOUNTER — Other Ambulatory Visit (HOSPITAL_COMMUNITY)
Admission: RE | Admit: 2024-01-25 | Discharge: 2024-01-25 | Disposition: A | Discharge status: Home / Self Care | Source: Ambulatory Visit | Attending: Obstetrics and Gynecology | Admitting: Obstetrics and Gynecology

## 2024-01-25 DIAGNOSIS — R319 Hematuria, unspecified: Secondary | ICD-10-CM | POA: Diagnosis present

## 2024-01-25 LAB — URINALYSIS, ROUTINE W REFLEX MICROSCOPIC
Bilirubin Urine: NEGATIVE
Glucose, UA: NEGATIVE mg/dL
Ketones, ur: 5 mg/dL — AB
Nitrite: NEGATIVE
Protein, ur: 30 mg/dL — AB
Specific Gravity, Urine: 1.02 (ref 1.005–1.030)
WBC, UA: >50 WBC/hpf (ref 0–5)
pH: 5 (ref 5.0–8.0)

## 2024-01-27 ENCOUNTER — Ambulatory Visit: Payer: Self-pay | Admitting: Obstetrics and Gynecology

## 2024-03-22 ENCOUNTER — Encounter: Payer: Self-pay | Admitting: Physician Assistant

## 2024-03-22 ENCOUNTER — Ambulatory Visit: Admitting: Physician Assistant

## 2024-03-22 VITALS — BP 126/82 | HR 83 | Temp 96.6°F | Ht 63.0 in | Wt 135.6 lb

## 2024-03-22 DIAGNOSIS — G20A2 Parkinson's disease without dyskinesia, with fluctuations: Secondary | ICD-10-CM

## 2024-03-22 DIAGNOSIS — R300 Dysuria: Secondary | ICD-10-CM | POA: Diagnosis not present

## 2024-03-22 LAB — POCT URINALYSIS DIPSTICK
Bilirubin, UA: NEGATIVE
Blood, UA: POSITIVE
Glucose, UA: NEGATIVE
Ketones, UA: NEGATIVE
Nitrite, UA: POSITIVE
Protein, UA: NEGATIVE
Spec Grav, UA: 1.02
Urobilinogen, UA: 0.2 U/dL
pH, UA: 6

## 2024-03-22 MED ORDER — CEPHALEXIN 500 MG PO CAPS: 500.0000 mg | ORAL_CAPSULE | Freq: Two times a day (BID) | ORAL | 0 refills | Status: AC

## 2024-03-22 NOTE — Progress Notes (Signed)
 "  History of Present Illness:   Chief Complaint  Patient presents with   Urinary Tract Infection    Frequency, burning with urination  Symptoms x 5 day     Discussed the use of AI scribe software for clinical note transcription with the patient, who gave verbal consent to proceed.  History of Present Illness   Julia [name] is an 83 year old female with recurrent urinary tract infections who presents with symptoms of a urinary tract infection.  Her urinary symptoms started Friday or Saturday morning. She is very attuned to UTI symptoms due to her recurrent infections. She is taking Azo, last dose about an hour ago, and is aware it may cause a false positive for blood on urinalysis. She is worried about symptom onset during the holiday period.  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 She previously tried pelvic floor physical therapy and a pessary for pelvic support, but the pessary was ineffective because it repeatedly fell out. She does not use a cane for her usual two-mile walks.  She has multiple antibiotic allergies, including cefdinir, but tolerates cephalexin . She believes some listed allergies are medication side effects rather than true allergies and is concerned about inaccuracies in her medical record, including being labeled incontinent after a single episode in her sixties.  She denies nausea and vomiting. She has occasional lower side pain.        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   RLS (restless legs syndrome) 07/14/2018   Scoliosis    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HCC)    Skin cancer 07/2022   removed from R arm     Social History[1]  Past  Surgical History:  Procedure Laterality Date   ADENOIDECTOMY     BREAST LUMPECTOMY Left    radiation only   BUNIONECTOMY     COLON SURGERY     HEMORRHOID SURGERY     LAPAROSCOPY     NASAL SINUS SURGERY     OOPHORECTOMY     TONSILLECTOMY     TUBAL LIGATION      Family History  Problem Relation Age of Onset   Arthritis Mother    Diabetes Mother    Heart disease Mother    Hypertension Mother    Arthritis Father    Cancer Father    Depression Father    Osteoporosis Father    Breast cancer Sister    Arthritis Sister    Cancer Sister    Diabetes Sister    Cancer Sister    Cancer Sister    Hyperlipidemia Sister    Cancer Brother    Hyperlipidemia Brother    Parkinson's disease Neg Hx     Allergies[2]  Current Medications:  Current Medications[3]   Review of Systems:   Negative unless otherwise specified per HPI.  Vitals:   Vitals:   03/22/24 1126  BP: 126/82  Pulse: 83  Temp: (!) 96.6 F (35.9 C)  TempSrc: Temporal  SpO2: 95%  Weight: 135 lb 9.6 oz (61.5 kg)  Height: 5' 3 (1.6 m)     Body mass index is 24.02 kg/m.  Physical Exam:   Physical Exam  Vitals and nursing note reviewed.  Constitutional:      General: She is not in acute distress.    Appearance: She is well-developed. She is not ill-appearing or toxic-appearing.  Cardiovascular:     Rate and Rhythm: Normal rate and regular rhythm.     Pulses: Normal pulses.     Heart sounds: Normal heart sounds, S1 normal and S2 normal.  Pulmonary:     Effort: Pulmonary effort is normal.     Breath sounds: Normal breath sounds.  Skin:    General: Skin is warm and dry.  Neurological:     Mental Status: She is alert.     GCS: GCS eye subscore is 4. GCS verbal subscore is 5. GCS motor subscore is 6.  Psychiatric:        Speech: Speech normal.        Behavior: Behavior normal. Behavior is cooperative.     Assessment and Plan:   Assessment and Plan    Dysuria  Urinalysis indicates possible  urinary tract infection. No red flags on exam or discussion. Vitals are stable. - Sent urine for culture. - Start keflex  500 mg twice daily while await culture results - Advised improvement in 1-2 days; if symptoms worsen, return for evaluation. - Will call with culture results.  Parkinson's disease with fluctuating manifestations, unspecified whether dyskinesia present [facility])  Experiencing cognitive issues. Current neurologist care unsatisfactory. Interested in second opinion. - Referred to Dr. Evonnie, a Parkinson's specialist, for second opinion. - Discussed potential transfer of care to Dr. Tiburcio.       Lucie Buttner, PA-C    [1]  Social History Tobacco Use   Smoking status: Never    Passive exposure: Never   Smokeless tobacco: Never  Vaping Use   Vaping status: Never Used  Substance Use Topics   Alcohol use: Yes    Comment: a beer once a month with pizza   Drug use: Never  [2]  Allergies Allergen Reactions   Cefdinir    Cholestyramine     GERD   Contrast Media [Iodinated Contrast Media] Diarrhea   Ezetimibe    Meperidine    Nitrofurantoin Diarrhea   Septra  [Sulfamethoxazole -Trimethoprim ] Diarrhea   Statins    Teriparatide   [3]  Current Outpatient Medications:    Acetaminophen  (TYLENOL  PO), Take by mouth as needed., Disp: , Rfl:    Calcium Citrate-Vitamin D  315-250 MG-UNIT TABS, Take by mouth., Disp: , Rfl:    carbidopa -levodopa  (SINEMET  CR) 50-200 MG tablet, Take 1 tablet 4 times daily - start at 7am, then repeat at 11am, 3pm and 7pm, Disp: 360 tablet, Rfl: 2   cephALEXin  (KEFLEX ) 500 MG capsule, Take 1 capsule (500 mg total) by mouth 2 (two) times daily., Disp: 14 capsule, Rfl: 0   cholecalciferol (VITAMIN D3) 25 MCG (1000 UNIT) tablet, Take 1,000 Units by mouth daily., Disp: , Rfl:    cyanocobalamin (VITAMIN B12) 500 MCG tablet, Take 500 mcg by mouth daily., Disp: , Rfl:    denosumab  (PROLIA ) 60 MG/ML SOSY injection, Inject 60 mg into the skin every 6 (six)  months., Disp: 180 mL, Rfl: 0   levothyroxine  (SYNTHROID ) 88 MCG tablet, TAKE ONE TABLET BY MOUTH DAILY FOR BREAKFAST, Disp: 90 tablet, Rfl: 3   Multiple Vitamins-Minerals (CENTRUM SILVER PO), Take by mouth., Disp: , Rfl:    Povidone, PF, (IVIZIA DRY EYES) 0.5 % SOLN, Apply to eye., Disp: , Rfl:    pramipexole  (MIRAPEX ) 0.25 MG tablet, TAKE 1 TABLET BY MOUTH 2  HOURS BEFORE BEDTIME AND 1 TABLET AT BEDTIME., Disp: 180 tablet, Rfl: 1   Probiotic Product (PROBIOTIC DAILY PO), Take by mouth., Disp: , Rfl:    psyllium (HYDROCIL/METAMUCIL) 95 % PACK, Take 1 packet by mouth daily., Disp: , Rfl:    Pumpkin Seed-Soy Germ (AZO BLADDER CONTROL/GO-LESS PO), Take by mouth., Disp: , Rfl:    SALINE NASAL MIST NA, Place into the nose., Disp: , Rfl:   Current Facility-Administered Medications:    denosumab  (PROLIA ) injection 60 mg, 60 mg, Subcutaneous, Q6 months, Jodie Lavern CROME, MD, 60 mg at 04/01/23 1523  "

## 2024-03-22 NOTE — Patient Instructions (Signed)
 It was great to see you!  Start cephalexin   I will refer to Dr Asberry Tat to see if she would be willing to take on your Parkinson's disease care  General instructions Make sure you: Pee until your bladder is empty. Do not hold pee for a long time. Empty your bladder after sex. Wipe from front to back after pooping if you are a female. Use each tissue one time when you wipe. Drink enough fluid to keep your pee pale yellow. Keep all follow-up visits as told by your doctor. This is important. Contact a doctor if: You do not get better after 1-2 days. Your symptoms go away and then come back. Get help right away if: You have very bad back pain. You have very bad pain in your lower belly. You have a fever. You are sick to your stomach (nauseous). You are throwing up.   Take care,  Lucie Buttner PA-C

## 2024-03-24 LAB — URINE CULTURE
MICRO NUMBER:: 17397571
SPECIMEN QUALITY:: ADEQUATE

## 2024-03-27 ENCOUNTER — Ambulatory Visit: Payer: Self-pay | Admitting: Family Medicine

## 2024-03-27 NOTE — Progress Notes (Signed)
 Patient read result notes from Dr. Kennyth via MyChart.  Last read by Ronal DELENA Gowda at 12:13PM on 03/27/2024

## 2024-03-27 NOTE — Progress Notes (Signed)
 She saw Sam for urinary tract infection symptoms which was confirmed by her urine culture.  The antibiotic that sam sent in should treat this.  She should let us  know if her symptoms are not improving.

## 2024-03-29 ENCOUNTER — Encounter: Payer: Self-pay | Admitting: Neurology

## 2024-03-31 ENCOUNTER — Telehealth: Payer: Self-pay

## 2024-03-31 ENCOUNTER — Other Ambulatory Visit (HOSPITAL_COMMUNITY): Payer: Self-pay

## 2024-03-31 NOTE — Telephone Encounter (Signed)
 Prolia  VOB initiated via [email or web address]  Next Prolia  inj DUE: 04/07/24   PHARMACY COPAY: $500

## 2024-04-03 NOTE — Telephone Encounter (Signed)
 MEDICAL PA SUBMITTED VIA [employer]  Authorization Number : [id number]

## 2024-04-03 NOTE — Telephone Encounter (Signed)
 SABRA

## 2024-04-04 NOTE — Telephone Encounter (Signed)
 Julia [name]

## 2024-04-04 NOTE — Telephone Encounter (Signed)
 Pt ready for scheduling for PROLIA  on or after : 04/07/24  Option# 1: Buy/Bill (Office supplied medication)  Out-of-pocket cost due at time of clinic visit: $25  Number of injection/visits approved: 2  Primary: [employer] Prolia  co-insurance: 0% Admin fee co-insurance: $25  Secondary: --- Prolia  co-insurance:  Admin fee co-insurance:   Medical Benefit Details: Date Benefits were checked: 04/03/24 Deductible: NO/ Coinsurance: 0%/ Admin Fee: $25  Prior Auth: APPROVED PA# [id number] Expiration Date: 04/03/24-04/03/25  # of doses approved: 2 ----------------------------------------------------------------------- Option# 2- Med Obtained from pharmacy:  Pharmacy benefit: Copay $500 (Paid to pharmacy) Admin Fee: $25 (Pay at clinic)  Prior Auth: N/A PA# Expiration Date:   # of doses approved:   If patient wants fill through the pharmacy benefit please send prescription to: [facility], and include estimated need by date in rx notes. Pharmacy will ship medication directly to the office.  Patient NOT eligible for Prolia  Copay Card. Copay Card can make patient's cost as little as $25. Link to apply: [email or web address]  ** This summary of benefits is an estimation of the patient's out-of-pocket cost. Exact cost may very based on individual plan coverage.

## 2024-04-27 ENCOUNTER — Other Ambulatory Visit

## 2024-05-04 ENCOUNTER — Ambulatory Visit: Admitting: Adult Health

## 2024-05-11 ENCOUNTER — Other Ambulatory Visit (HOSPITAL_BASED_OUTPATIENT_CLINIC_OR_DEPARTMENT_OTHER)

## 2024-05-15 ENCOUNTER — Ambulatory Visit

## 2024-05-15 ENCOUNTER — Encounter: Admitting: Family Medicine

## 2024-06-26 ENCOUNTER — Ambulatory Visit: Payer: Self-pay | Admitting: Neurology

## 2024-12-19 ENCOUNTER — Ambulatory Visit: Admitting: Dermatology
